# Patient Record
Sex: Female | Born: 1940
Health system: Southern US, Community
[De-identification: ages and names within clinical notes are randomized; demographics above are authoritative.]

## PROBLEM LIST (undated history)

## (undated) DIAGNOSIS — J189 Pneumonia, unspecified organism: Secondary | ICD-10-CM

## (undated) DIAGNOSIS — I493 Ventricular premature depolarization: Secondary | ICD-10-CM

## (undated) DIAGNOSIS — E739 Lactose intolerance, unspecified: Secondary | ICD-10-CM

## (undated) DIAGNOSIS — E559 Vitamin D deficiency, unspecified: Secondary | ICD-10-CM

## (undated) DIAGNOSIS — Z9289 Personal history of other medical treatment: Secondary | ICD-10-CM

## (undated) DIAGNOSIS — L209 Atopic dermatitis, unspecified: Secondary | ICD-10-CM

## (undated) DIAGNOSIS — Z96653 Presence of artificial knee joint, bilateral: Secondary | ICD-10-CM

## (undated) DIAGNOSIS — L659 Nonscarring hair loss, unspecified: Secondary | ICD-10-CM

## (undated) DIAGNOSIS — R32 Unspecified urinary incontinence: Secondary | ICD-10-CM

## (undated) DIAGNOSIS — F32A Depression, unspecified: Secondary | ICD-10-CM

## (undated) DIAGNOSIS — L309 Dermatitis, unspecified: Secondary | ICD-10-CM

## (undated) DIAGNOSIS — Z91018 Allergy to other foods: Secondary | ICD-10-CM

## (undated) DIAGNOSIS — M1990 Unspecified osteoarthritis, unspecified site: Secondary | ICD-10-CM

## (undated) DIAGNOSIS — L509 Urticaria, unspecified: Secondary | ICD-10-CM

## (undated) DIAGNOSIS — T4145XA Adverse effect of unspecified anesthetic, initial encounter: Secondary | ICD-10-CM

## (undated) DIAGNOSIS — I10 Essential (primary) hypertension: Secondary | ICD-10-CM

## (undated) DIAGNOSIS — F419 Anxiety disorder, unspecified: Secondary | ICD-10-CM

## (undated) DIAGNOSIS — M8580 Other specified disorders of bone density and structure, unspecified site: Secondary | ICD-10-CM

## (undated) DIAGNOSIS — B449 Aspergillosis, unspecified: Secondary | ICD-10-CM

## (undated) DIAGNOSIS — J45909 Unspecified asthma, uncomplicated: Secondary | ICD-10-CM

## (undated) DIAGNOSIS — R0602 Shortness of breath: Secondary | ICD-10-CM

## (undated) DIAGNOSIS — E039 Hypothyroidism, unspecified: Secondary | ICD-10-CM

## (undated) DIAGNOSIS — N189 Chronic kidney disease, unspecified: Secondary | ICD-10-CM

## (undated) DIAGNOSIS — E785 Hyperlipidemia, unspecified: Secondary | ICD-10-CM

## (undated) DIAGNOSIS — Z96642 Presence of left artificial hip joint: Secondary | ICD-10-CM

## (undated) DIAGNOSIS — N3030 Trigonitis without hematuria: Secondary | ICD-10-CM

## (undated) DIAGNOSIS — T7840XA Allergy, unspecified, initial encounter: Secondary | ICD-10-CM

## (undated) DIAGNOSIS — T8859XA Other complications of anesthesia, initial encounter: Secondary | ICD-10-CM

## (undated) DIAGNOSIS — F329 Major depressive disorder, single episode, unspecified: Secondary | ICD-10-CM

## (undated) DIAGNOSIS — E079 Disorder of thyroid, unspecified: Secondary | ICD-10-CM

## (undated) HISTORY — PX: CHOLECYSTECTOMY: SHX55

## (undated) HISTORY — DX: Trigonitis without hematuria: N30.30

## (undated) HISTORY — DX: Shortness of breath: R06.02

## (undated) HISTORY — DX: Unspecified osteoarthritis, unspecified site: M19.90

## (undated) HISTORY — PX: ADENOIDECTOMY: SUR15

## (undated) HISTORY — DX: Presence of artificial knee joint, bilateral: Z96.653

## (undated) HISTORY — DX: Urticaria, unspecified: L50.9

## (undated) HISTORY — PX: EYELID REPAIR W/ SKIN GRAFT: SHX1565

## (undated) HISTORY — DX: Lactose intolerance, unspecified: E73.9

## (undated) HISTORY — DX: Allergy to other foods: Z91.018

## (undated) HISTORY — DX: Allergy, unspecified, initial encounter: T78.40XA

## (undated) HISTORY — PX: BREAST SURGERY: SHX581

## (undated) HISTORY — PX: TONSILLECTOMY: SUR1361

## (undated) HISTORY — DX: Aspergillosis, unspecified: B44.9

## (undated) HISTORY — PX: COLONOSCOPY W/ POLYPECTOMY: SHX1380

## (undated) HISTORY — PX: CARPAL TUNNEL RELEASE: SHX101

## (undated) HISTORY — PX: LEFT OOPHORECTOMY: SHX1961

## (undated) HISTORY — PX: BLADDER REMOVAL: SHX567

## (undated) HISTORY — DX: Atopic dermatitis, unspecified: L20.9

## (undated) HISTORY — DX: Unspecified asthma, uncomplicated: J45.909

## (undated) HISTORY — PX: JOINT REPLACEMENT: SHX530

## (undated) HISTORY — DX: Anxiety disorder, unspecified: F41.9

## (undated) HISTORY — DX: Depression, unspecified: F32.A

## (undated) HISTORY — DX: Disorder of thyroid, unspecified: E07.9

## (undated) HISTORY — PX: APPENDECTOMY: SHX54

## (undated) HISTORY — PX: REPLACEMENT TOTAL KNEE BILATERAL: SUR1225

## (undated) HISTORY — PX: TUBAL LIGATION: SHX77

## (undated) HISTORY — DX: Vitamin D deficiency, unspecified: E55.9

## (undated) HISTORY — DX: Hyperlipidemia, unspecified: E78.5

## (undated) HISTORY — PX: EYE SURGERY: SHX253

## (undated) HISTORY — DX: Presence of left artificial hip joint: Z96.642

---
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
## 1988-06-11 HISTORY — PX: BREAST EXCISIONAL BIOPSY: SUR124

## 1999-01-18 ENCOUNTER — Other Ambulatory Visit: Admission: RE | Admit: 1999-01-18 | Discharge: 1999-01-18 | Payer: Self-pay | Admitting: Internal Medicine

## 1999-03-03 ENCOUNTER — Encounter: Payer: Self-pay | Admitting: Internal Medicine

## 1999-03-03 ENCOUNTER — Ambulatory Visit (HOSPITAL_COMMUNITY): Admission: RE | Admit: 1999-03-03 | Discharge: 1999-03-03 | Payer: Self-pay | Admitting: Rheumatology

## 1999-09-10 ENCOUNTER — Emergency Department (HOSPITAL_COMMUNITY): Admission: EM | Admit: 1999-09-10 | Discharge: 1999-09-10 | Payer: Self-pay | Admitting: Internal Medicine

## 1999-09-10 ENCOUNTER — Encounter: Payer: Self-pay | Admitting: Internal Medicine

## 1999-12-21 ENCOUNTER — Inpatient Hospital Stay (HOSPITAL_COMMUNITY): Admission: RE | Admit: 1999-12-21 | Discharge: 1999-12-25 | Payer: Self-pay | Admitting: Orthopaedic Surgery

## 1999-12-25 ENCOUNTER — Inpatient Hospital Stay (HOSPITAL_COMMUNITY)
Admission: RE | Admit: 1999-12-25 | Discharge: 1999-12-29 | Payer: Self-pay | Admitting: Physical Medicine & Rehabilitation

## 2000-01-15 ENCOUNTER — Encounter: Admission: RE | Admit: 2000-01-15 | Discharge: 2000-02-08 | Payer: Self-pay | Admitting: Orthopaedic Surgery

## 2000-01-18 ENCOUNTER — Other Ambulatory Visit: Admission: RE | Admit: 2000-01-18 | Discharge: 2000-01-18 | Payer: Self-pay | Admitting: Internal Medicine

## 2000-02-08 ENCOUNTER — Inpatient Hospital Stay (HOSPITAL_COMMUNITY): Admission: RE | Admit: 2000-02-08 | Discharge: 2000-02-13 | Payer: Self-pay | Admitting: Orthopaedic Surgery

## 2000-02-13 ENCOUNTER — Inpatient Hospital Stay (HOSPITAL_COMMUNITY)
Admission: RE | Admit: 2000-02-13 | Discharge: 2000-02-17 | Payer: Self-pay | Admitting: Physical Medicine & Rehabilitation

## 2000-03-07 ENCOUNTER — Encounter: Payer: Self-pay | Admitting: Internal Medicine

## 2000-03-07 ENCOUNTER — Encounter: Admission: RE | Admit: 2000-03-07 | Discharge: 2000-03-07 | Payer: Self-pay | Admitting: Internal Medicine

## 2000-03-08 ENCOUNTER — Encounter: Admission: RE | Admit: 2000-03-08 | Discharge: 2000-05-15 | Payer: Self-pay | Admitting: Orthopaedic Surgery

## 2000-09-06 ENCOUNTER — Encounter: Admission: RE | Admit: 2000-09-06 | Discharge: 2000-12-05 | Payer: Self-pay | Admitting: Orthopaedic Surgery

## 2000-10-08 ENCOUNTER — Ambulatory Visit (HOSPITAL_COMMUNITY): Admission: RE | Admit: 2000-10-08 | Discharge: 2000-10-08 | Payer: Self-pay | Admitting: Orthopaedic Surgery

## 2000-10-08 ENCOUNTER — Encounter: Payer: Self-pay | Admitting: Orthopaedic Surgery

## 2000-10-15 ENCOUNTER — Encounter: Admission: RE | Admit: 2000-10-15 | Discharge: 2000-10-15 | Payer: Self-pay | Admitting: Infectious Diseases

## 2000-10-29 ENCOUNTER — Encounter: Admission: RE | Admit: 2000-10-29 | Discharge: 2000-10-29 | Payer: Self-pay | Admitting: Infectious Diseases

## 2000-11-13 ENCOUNTER — Encounter: Payer: Self-pay | Admitting: Rheumatology

## 2000-11-13 ENCOUNTER — Encounter: Admission: RE | Admit: 2000-11-13 | Discharge: 2000-11-13 | Payer: Self-pay | Admitting: Rheumatology

## 2000-11-29 ENCOUNTER — Encounter: Admission: RE | Admit: 2000-11-29 | Discharge: 2000-11-29 | Payer: Self-pay | Admitting: Infectious Diseases

## 2000-12-16 ENCOUNTER — Encounter: Admission: RE | Admit: 2000-12-16 | Discharge: 2001-01-08 | Payer: Self-pay | Admitting: Orthopaedic Surgery

## 2001-01-04 ENCOUNTER — Encounter: Payer: Self-pay | Admitting: Emergency Medicine

## 2001-01-04 ENCOUNTER — Emergency Department (HOSPITAL_COMMUNITY): Admission: EM | Admit: 2001-01-04 | Discharge: 2001-01-04 | Payer: Self-pay | Admitting: Emergency Medicine

## 2001-01-08 ENCOUNTER — Encounter: Admission: RE | Admit: 2001-01-08 | Discharge: 2001-01-08 | Payer: Self-pay | Admitting: Infectious Diseases

## 2001-01-09 ENCOUNTER — Encounter: Admission: RE | Admit: 2001-01-09 | Discharge: 2001-01-22 | Payer: Self-pay | Admitting: Orthopaedic Surgery

## 2001-01-20 ENCOUNTER — Ambulatory Visit (HOSPITAL_COMMUNITY): Admission: RE | Admit: 2001-01-20 | Discharge: 2001-01-20 | Payer: Self-pay | Admitting: Infectious Diseases

## 2001-01-20 ENCOUNTER — Other Ambulatory Visit: Admission: RE | Admit: 2001-01-20 | Discharge: 2001-01-20 | Payer: Self-pay | Admitting: Internal Medicine

## 2001-01-20 ENCOUNTER — Encounter: Payer: Self-pay | Admitting: Infectious Diseases

## 2001-02-07 ENCOUNTER — Encounter: Admission: RE | Admit: 2001-02-07 | Discharge: 2001-02-07 | Payer: Self-pay | Admitting: Infectious Diseases

## 2001-03-10 ENCOUNTER — Encounter: Admission: RE | Admit: 2001-03-10 | Discharge: 2001-03-10 | Payer: Self-pay | Admitting: Internal Medicine

## 2001-03-10 ENCOUNTER — Encounter: Payer: Self-pay | Admitting: Internal Medicine

## 2001-04-23 ENCOUNTER — Encounter: Payer: Self-pay | Admitting: Orthopaedic Surgery

## 2001-04-23 ENCOUNTER — Inpatient Hospital Stay (HOSPITAL_COMMUNITY): Admission: RE | Admit: 2001-04-23 | Discharge: 2001-04-28 | Payer: Self-pay | Admitting: Orthopaedic Surgery

## 2001-04-23 ENCOUNTER — Encounter (INDEPENDENT_AMBULATORY_CARE_PROVIDER_SITE_OTHER): Payer: Self-pay | Admitting: Specialist

## 2001-04-27 ENCOUNTER — Encounter: Payer: Self-pay | Admitting: Orthopaedic Surgery

## 2001-04-28 ENCOUNTER — Inpatient Hospital Stay (HOSPITAL_COMMUNITY)
Admission: RE | Admit: 2001-04-28 | Discharge: 2001-05-05 | Payer: Self-pay | Admitting: Physical Medicine & Rehabilitation

## 2001-04-28 ENCOUNTER — Encounter: Payer: Self-pay | Admitting: Orthopaedic Surgery

## 2001-05-07 ENCOUNTER — Inpatient Hospital Stay (HOSPITAL_COMMUNITY): Admission: AD | Admit: 2001-05-07 | Discharge: 2001-05-19 | Payer: Self-pay | Admitting: Orthopaedic Surgery

## 2001-06-19 ENCOUNTER — Encounter: Payer: Self-pay | Admitting: Orthopaedic Surgery

## 2001-06-19 ENCOUNTER — Inpatient Hospital Stay (HOSPITAL_COMMUNITY): Admission: RE | Admit: 2001-06-19 | Discharge: 2001-06-25 | Payer: Self-pay | Admitting: Orthopaedic Surgery

## 2001-07-24 ENCOUNTER — Encounter: Admission: RE | Admit: 2001-07-24 | Discharge: 2001-08-14 | Payer: Self-pay | Admitting: Orthopaedic Surgery

## 2001-11-11 ENCOUNTER — Encounter: Payer: Self-pay | Admitting: Emergency Medicine

## 2001-11-11 ENCOUNTER — Inpatient Hospital Stay (HOSPITAL_COMMUNITY): Admission: EM | Admit: 2001-11-11 | Discharge: 2001-11-15 | Payer: Self-pay | Admitting: Emergency Medicine

## 2001-11-11 ENCOUNTER — Encounter (INDEPENDENT_AMBULATORY_CARE_PROVIDER_SITE_OTHER): Payer: Self-pay | Admitting: Specialist

## 2001-11-12 ENCOUNTER — Encounter: Payer: Self-pay | Admitting: Gastroenterology

## 2001-11-13 ENCOUNTER — Encounter: Payer: Self-pay | Admitting: General Surgery

## 2002-01-22 ENCOUNTER — Other Ambulatory Visit: Admission: RE | Admit: 2002-01-22 | Discharge: 2002-01-22 | Payer: Self-pay | Admitting: Internal Medicine

## 2002-03-11 ENCOUNTER — Encounter: Admission: RE | Admit: 2002-03-11 | Discharge: 2002-03-11 | Payer: Self-pay | Admitting: Internal Medicine

## 2002-03-11 ENCOUNTER — Encounter: Payer: Self-pay | Admitting: Internal Medicine

## 2002-05-01 ENCOUNTER — Ambulatory Visit (HOSPITAL_COMMUNITY): Admission: RE | Admit: 2002-05-01 | Discharge: 2002-05-01 | Payer: Self-pay | Admitting: Gastroenterology

## 2002-05-01 ENCOUNTER — Encounter (INDEPENDENT_AMBULATORY_CARE_PROVIDER_SITE_OTHER): Payer: Self-pay | Admitting: Specialist

## 2002-05-12 ENCOUNTER — Ambulatory Visit (HOSPITAL_COMMUNITY): Admission: RE | Admit: 2002-05-12 | Discharge: 2002-05-12 | Payer: Self-pay | Admitting: *Deleted

## 2002-10-04 ENCOUNTER — Encounter: Payer: Self-pay | Admitting: *Deleted

## 2002-10-04 ENCOUNTER — Inpatient Hospital Stay (HOSPITAL_COMMUNITY): Admission: EM | Admit: 2002-10-04 | Discharge: 2002-10-14 | Payer: Self-pay | Admitting: *Deleted

## 2002-10-06 ENCOUNTER — Encounter: Payer: Self-pay | Admitting: Internal Medicine

## 2002-10-15 ENCOUNTER — Encounter (HOSPITAL_COMMUNITY): Admission: RE | Admit: 2002-10-15 | Discharge: 2003-01-13 | Payer: Self-pay | Admitting: Internal Medicine

## 2002-10-16 ENCOUNTER — Encounter: Payer: Self-pay | Admitting: Internal Medicine

## 2002-10-20 ENCOUNTER — Encounter: Payer: Self-pay | Admitting: Internal Medicine

## 2002-10-20 ENCOUNTER — Ambulatory Visit (HOSPITAL_COMMUNITY): Admission: RE | Admit: 2002-10-20 | Discharge: 2002-10-20 | Payer: Self-pay | Admitting: Internal Medicine

## 2002-10-21 ENCOUNTER — Encounter: Payer: Self-pay | Admitting: Orthopaedic Surgery

## 2002-10-23 ENCOUNTER — Inpatient Hospital Stay (HOSPITAL_COMMUNITY): Admission: RE | Admit: 2002-10-23 | Discharge: 2002-10-28 | Payer: Self-pay | Admitting: Orthopaedic Surgery

## 2002-10-29 ENCOUNTER — Encounter: Payer: Self-pay | Admitting: Internal Medicine

## 2002-11-11 ENCOUNTER — Encounter: Admission: RE | Admit: 2002-11-11 | Discharge: 2002-11-11 | Payer: Self-pay | Admitting: Infectious Diseases

## 2002-11-18 ENCOUNTER — Encounter: Admission: RE | Admit: 2002-11-18 | Discharge: 2002-11-18 | Payer: Self-pay | Admitting: Infectious Diseases

## 2002-11-18 ENCOUNTER — Encounter: Payer: Self-pay | Admitting: Internal Medicine

## 2002-11-18 ENCOUNTER — Inpatient Hospital Stay (HOSPITAL_COMMUNITY): Admission: AD | Admit: 2002-11-18 | Discharge: 2002-11-19 | Payer: Self-pay | Admitting: Internal Medicine

## 2002-12-02 ENCOUNTER — Encounter: Admission: RE | Admit: 2002-12-02 | Discharge: 2002-12-02 | Payer: Self-pay | Admitting: Infectious Diseases

## 2003-01-20 ENCOUNTER — Encounter: Admission: RE | Admit: 2003-01-20 | Discharge: 2003-01-20 | Payer: Self-pay | Admitting: Infectious Diseases

## 2003-01-25 ENCOUNTER — Other Ambulatory Visit: Admission: RE | Admit: 2003-01-25 | Discharge: 2003-01-25 | Payer: Self-pay | Admitting: Internal Medicine

## 2003-03-24 ENCOUNTER — Encounter: Admission: RE | Admit: 2003-03-24 | Discharge: 2003-03-24 | Payer: Self-pay | Admitting: Internal Medicine

## 2003-03-24 ENCOUNTER — Encounter: Payer: Self-pay | Admitting: Internal Medicine

## 2003-04-11 ENCOUNTER — Emergency Department (HOSPITAL_COMMUNITY): Admission: EM | Admit: 2003-04-11 | Discharge: 2003-04-11 | Payer: Self-pay | Admitting: Emergency Medicine

## 2003-04-21 ENCOUNTER — Encounter: Admission: RE | Admit: 2003-04-21 | Discharge: 2003-04-21 | Payer: Self-pay | Admitting: Infectious Diseases

## 2004-02-02 ENCOUNTER — Other Ambulatory Visit: Admission: RE | Admit: 2004-02-02 | Discharge: 2004-02-02 | Payer: Self-pay | Admitting: Internal Medicine

## 2004-05-30 ENCOUNTER — Encounter: Admission: RE | Admit: 2004-05-30 | Discharge: 2004-05-30 | Payer: Self-pay | Admitting: Internal Medicine

## 2005-08-03 ENCOUNTER — Encounter: Admission: RE | Admit: 2005-08-03 | Discharge: 2005-08-03 | Payer: Self-pay | Admitting: Internal Medicine

## 2006-01-31 ENCOUNTER — Encounter (INDEPENDENT_AMBULATORY_CARE_PROVIDER_SITE_OTHER): Payer: Self-pay | Admitting: *Deleted

## 2006-01-31 ENCOUNTER — Ambulatory Visit (HOSPITAL_COMMUNITY): Admission: RE | Admit: 2006-01-31 | Discharge: 2006-01-31 | Payer: Self-pay | Admitting: Gastroenterology

## 2006-08-27 ENCOUNTER — Encounter: Admission: RE | Admit: 2006-08-27 | Discharge: 2006-08-27 | Payer: Self-pay | Admitting: Internal Medicine

## 2007-05-07 ENCOUNTER — Other Ambulatory Visit: Admission: RE | Admit: 2007-05-07 | Discharge: 2007-05-07 | Payer: Self-pay | Admitting: Obstetrics and Gynecology

## 2007-09-15 ENCOUNTER — Encounter: Admission: RE | Admit: 2007-09-15 | Discharge: 2007-09-15 | Payer: Self-pay | Admitting: Internal Medicine

## 2008-01-13 ENCOUNTER — Inpatient Hospital Stay (HOSPITAL_COMMUNITY): Admission: RE | Admit: 2008-01-13 | Discharge: 2008-01-17 | Payer: Self-pay | Admitting: Orthopaedic Surgery

## 2008-01-14 ENCOUNTER — Ambulatory Visit: Payer: Self-pay | Admitting: Internal Medicine

## 2008-02-05 ENCOUNTER — Ambulatory Visit: Payer: Self-pay | Admitting: Infectious Diseases

## 2008-02-05 DIAGNOSIS — L0390 Cellulitis, unspecified: Secondary | ICD-10-CM

## 2008-02-05 DIAGNOSIS — L259 Unspecified contact dermatitis, unspecified cause: Secondary | ICD-10-CM | POA: Insufficient documentation

## 2008-02-05 DIAGNOSIS — T8453XS Infection and inflammatory reaction due to internal right knee prosthesis, sequela: Secondary | ICD-10-CM

## 2008-02-05 DIAGNOSIS — L0291 Cutaneous abscess, unspecified: Secondary | ICD-10-CM

## 2008-02-05 DIAGNOSIS — J45909 Unspecified asthma, uncomplicated: Secondary | ICD-10-CM

## 2008-02-05 LAB — CONVERTED CEMR LAB
Basophils Relative: 0 % (ref 0–1)
CRP: 1 mg/dL — ABNORMAL HIGH (ref <0.6)
Eosinophils Relative: 6 % — ABNORMAL HIGH (ref 0–5)
Hemoglobin: 13.2 g/dL (ref 12.0–15.0)
Lymphs Abs: 2.9 10*3/uL (ref 0.7–4.0)
MCV: 89.2 fL (ref 78.0–100.0)
Monocytes Absolute: 0.6 10*3/uL (ref 0.1–1.0)
Monocytes Relative: 7 % (ref 3–12)
Platelets: 467 10*3/uL — ABNORMAL HIGH (ref 150–400)
TSH: 2.924 microintl units/mL (ref 0.350–4.50)

## 2008-09-01 ENCOUNTER — Encounter: Admission: RE | Admit: 2008-09-01 | Discharge: 2008-09-01 | Payer: Self-pay | Admitting: Sports Medicine

## 2008-09-16 ENCOUNTER — Encounter: Admission: RE | Admit: 2008-09-16 | Discharge: 2008-12-15 | Payer: Self-pay | Admitting: Sports Medicine

## 2008-09-24 ENCOUNTER — Encounter: Admission: RE | Admit: 2008-09-24 | Discharge: 2008-09-24 | Payer: Self-pay | Admitting: Internal Medicine

## 2009-10-13 ENCOUNTER — Encounter: Admission: RE | Admit: 2009-10-13 | Discharge: 2009-10-13 | Payer: Self-pay | Admitting: Internal Medicine

## 2010-10-09 ENCOUNTER — Other Ambulatory Visit: Payer: Self-pay | Admitting: Internal Medicine

## 2010-10-09 DIAGNOSIS — Z1231 Encounter for screening mammogram for malignant neoplasm of breast: Secondary | ICD-10-CM

## 2010-10-24 NOTE — Op Note (Signed)
NAMEFIZA, Linda Rice           ACCOUNT NO.:  [id number]   MEDICAL RECORD NO.:  [id number]          PATIENT TYPE:  INP   LOCATION:  2550                         FACILITY:  [facility]   PHYSICIAN:  Vonna Kotyk. Whitfield, M.D.DATE OF BIRTH:  1941-01-31   DATE OF PROCEDURE:  01/13/2008  DATE OF DISCHARGE:                               OPERATIVE REPORT   PREOPERATIVE DIAGNOSES:  Septic arthrosis, right total knee replacement  versus poly wear synovitis.   POSTOPERATIVE DIAGNOSES:  Septic arthrosis, right total knee replacement  versus poly wear synovitis - cultures pending.   PROCEDURES:  Incision and drainage, right total knee replacement with  radical synovectomy, and polyethylene exchange.   SURGEON:  Vonna Kotyk. Durward Fortes, MD   ASSISTANT:  Mike Craze. Petrarca, PA-C   ANESTHESIA:  General with supplemental femoral nerve block.   COMPLICATIONS:  None.   COMPONENTS:  P.[employer] Sigma crosslink stabilized insert 2.5 x 10 mm.   PROCEDURE:  The patient is comfortable on the operating table and under  general anesthesia, nursing staff inserted Foley catheter.  The right  lower extremity was placed in a thigh tourniquet and the leg was then  prepped with DuraPrep and the tourniquet to the ankle.  Sterile draping  was performed with this extremity still elevated, was Esmarch  exsanguinated with a proximal tourniquet at 370 mmHg.   The prior midline longitudinal incision was elliptically excised.  The  subcutaneous tissue was incised with the Bovie.  The superficial capsule  was opened with the Bovie.  The deep capsule was identified by the  nonabsorbable stitches, which were then incised and removed.  The deep  capsule was then incised and the joint was entered.  There was a cloudy  yellow fluid that was sent for aerobic and anaerobic culture.  The  capsule was incised proximally and distally, so that I could evert the  patella.  The patella did not have a component, as it was quite thin.  The  knee was then flexed to 90 degrees.  There was appearance of some  poly wear of the polyethylene wear bearing.  Diffuse synovectomy was  performed.  I did not suspect that this was infected but rather poly  wear synovitis, radical synovectomy was performed.  At that point, there  was some evidence of erosion of the femur beneath the femoral component  more so medially than laterally.  The metallic interface was intact but  it was the bone that had been worn away presumably by the aggressive  synovitis.  This was completely debrided until the lesser extent on the  lateral aspect of the femur looked perfectly intact on the tibial side.  Further synovectomy was performed.  The polyethylene bearing was easily  removed.  The posterior aspect of the joint in the midline and medially  and laterally was then debrided of any synovitis.  I copiously irrigated  the joint with saline solution.  I mixed 1 pack of cement with  tobramycin and filled in the bony gaps medially and laterally.  When it  was hardened, the wound was again irrigated with saline solution.  A new  P.[employer] Sigma 2.5 x 10 mm fixed bridging bearing was then inserted  without difficulty and impacted through a full range of motion.  There  was no abnormal movement.  There was no opening with varus or valgus  stress.  The wound was again irrigated with saline solution.  I did use  FloSeal for about 3 minutes before the tourniquet was released.  At that  point, the wound was again irrigated after about 5 minutes.  I had a  nice dry field.  The deep capsule was closed with an interrupted #1  Ethibond.  Superficial caps were closed in several layers with 0 and 2-0  Vicryl with skin closed with skin clips.  Sterile bulky dressing was  applied followed by compressive sock.   The patient tolerated the procedure without complications.   I did send multiple specimens of deep synovium for aerobic, anaerobic  culture, as well as the  fluid.      Vonna Kotyk. Durward Fortes, M.D.  Electronically Signed     PWW/MEDQ  D:  01/13/2008  T:  01/14/2008  Job:  [id number]

## 2010-10-25 ENCOUNTER — Ambulatory Visit
Admission: RE | Admit: 2010-10-25 | Discharge: 2010-10-25 | Disposition: A | Discharge status: Home / Self Care | Payer: PRIVATE HEALTH INSURANCE | Source: Ambulatory Visit | Attending: Internal Medicine | Admitting: Internal Medicine

## 2010-10-25 DIAGNOSIS — Z1231 Encounter for screening mammogram for malignant neoplasm of breast: Secondary | ICD-10-CM

## 2010-10-27 NOTE — Discharge Summary (Signed)
[place]. [facility]  Patient:    Linda [name], Linda [name] Visit Number: [id number] MRN: [id number]          Service Type: MED Location: [id number] Attending Physician:  Reinaldo Berber Dictated by:   Evert Kohl, P.A.C. Admit Date:  05/07/2001 Discharge Date: 05/19/2001                             Discharge Summary  ADMISSION DIAGNOSES: 1. Failed painful right total knee arthroplasty. 2. Asthma. 3. Chronic eczema. 4. Latex allergy. 5. Multiple food and medication allergies.  DISCHARGE DIAGNOSES: 1. Removal of failed right total knee arthroplasty with antibiotic    impregnated cement spacer. 2. Infection of joint prosthesis. 3. Postoperative blood loss anemia. 4. Asthma. 5. Chronic eczema. 6. Latex allergy. 7. Multiple food and medication allergies.  HISTORY OF PRESENT ILLNESS:  The patient is a 70 year old white female with a history of right total knee arthroplasty in 2001.  The patient had good results from August to January 2002 when she started to have a twinge type pain first thing in the morning when she stood up getting out of bed. Progressively worsening to currently being excruciating first thing in the morning and constant throughout the day.  It is currently a significant problem causing disability to ambulate due to the pain. The pain is described as a broken bone-like pain with radiation down the tibia medially. The patient is unable to ambulate any distance without any significant discomfort. She feels that her knee is unstable feeling. She does have significant swelling, increased warmth in the knee.  She has had multiple workups to rule out infection which are negative for any organism or growth.  The patient ambulates with the use of a cane or walker since September when she had to go on disability from work due to difficulty ambulating.  ALLERGIES:  LATEX, ERYTHROMYCIN, KEFLEX, HORSE SERUM, and multiple  food allergies.  CURRENT MEDICATIONS:  1. Zyrtec 10 mg p.o. q.h.s.  2. Atarax 50 mg p.o. q.h.s.  3. Vicodin p.r.n.  4. Centrum Silver one tablet p.o. q.d.  5. Calcium with vitamin D 600 mg p.o. q.d.  6. Iron _________ 150 mg one hour prior to meal.  7. Dulcolax one tablet p.o. q.h.s.  8. Albuterol multidose inhaler two sprays q.a.m. and q.h.s.  9. Flovent one puff q.a.m. and q.p.m. 10. Ultravate p.r.n. 11. Rogaine applied b.i.d. to affected areas.  SURGICAL PROCEDURE:  On April 23, 2001, the patient was taken to the OR by Dr. Joni Fears, assisted by Lara Mulch, M.D.  Under general anesthesia, the patient had a synovial debridement of her right knee with removal of right knee replacement components with insertion of poly methyl methacrylate, antibiotic spacer.  One Hemovac drain was left in place. The patient tolerated the procedure well without any complications.  CONSULTS:  Infectious disease consult was requested for recommendations of antibiotic treatment.  Physical therapy, occupational therapy, and rehab consults were requested.  HOSPITAL COURSE:  On April 23, 2001, the patient was admitted to [facility] under the care of Dr. Durward Fortes.  The patient was taken to the OR where a removal of her right total knee prosthesis was performed, a synovectomy was also performed and a placement of antibiotic impregnated cement spacer was placed.  One Hemovac drain was left in place and the patient was transferred to the recovery room and then to the  orthopedic floor in good condition.  The patient then incurred a four-day postoperative course in which he did develop some postoperative blood loss anemia for which he was transfused two units of packed red blood cells with good results.  The patient was seen by infectious disease.  Their recommendations was IV antibiotics through a PICC line for six to eight weeks depending on cultures and they would  continue to monitor.  The patients wound remained benign for any signs of infection.  Her leg remained neuromotor vascularly intact. She pretty much remained afebrile. Due to her home situation where she lived by herself and had difficulty ambulating, physical therapy arrangements were made for rehab prior to being discharged home.  Arrangements were made and the patient was transferred to the rehab services in good condition.  LABS:  Pathology report showed synovial tissue right patella, no objective increase in neutrophil synovium, surface fibrin deposition and underlying chronic inflammation and histocytes with focal crystalline material.  Synovial fluid right knee.  Synovium foci of acute synovitis greater than 5 neutrophils per high powered field examined associated with chronic synovitis, benign bone fragments and fibrin.  EKG on April 28, 2001, showed sinus bradycardia at 51 beats per minute.  Chest x-ray on April 23, 2001, showed central line to left innominate SVC confluence without pneumothorax.  CBC on April 27, 2001, showed wbc 8.4, hemoglobin 10.0, hematocrit 29.5, platelets 314.  PT 23.4, INR 2.7.  April 26, 2001, routine chemistries showed sodium 139, potassium 3.4, glucose 105, BUN 5, creatinine 0.7.  The patient received a total of two units of packed red blood cells during hospitalization.  MEDICATIONS ON DISCHARGE FROM ORTHOPEDIC FLOOR:  1. Colace 100 mg p.o. b.i.d.  2. Claritin 10 mg p.o. q.h.s.  3. Atarax 50 mg p.o. q.h.s.  4. Multivitamins with minerals one capsule p.o. q.d.  5. Os-Cal 500 plus D one tablet p.o. q.d.  6. Albuterol one puff b.i.d.  7. Flovent one puff 110 mcg b.i.d.  8. OxyContin CR 30 mg p.o. q.12h.  9. Laxative and enema of choice p.r.n. 10. Coumadin 5 mg p.o. q.d. 11. Rocephin 1 g IV q.12h. 12. Vancomycin 1250 mg IV q.12h. 13. Percocet one or two tablets every four to six hours p.r.n. breakthrough     pain. 14. Tylenol 650  mg p.o. q.4h. p.r.n. 15. Skelaxin one or two tablets every six hours p.r.n. pain.  DISCHARGE INSTRUCTIONS:  1. Medications:  The patient is to continue medications dispensed on    ortho floor. 2. Activity:  The patient may be weightbearing as tolerated and must be in    CPM 0 to 40 degrees at least eight hours a day. 3. Diet:  No restrictions. 4. Wound care:  The patient is to have wound checked daily for any signs    of infection. Staples to be removed on or about postoperative day #14.    Steri-Strips to be applied. 5. Special instructions:  The patient is to maintain Coumadin INR of 2 to 2.5. 6. Follow-up:  The patient is to have a follow-up appointment with Dr.    Durward Fortes approximately two weeks from the date of discharge from    rehab. 7. The patient is to continue IV vancomycin and Rocephin until changed    otherwise by Dr. Durward Fortes or infectious disease services. Dictated by:   Evert Kohl, P.A.C. Attending Physician:  Reinaldo Berber DD:  05/21/01 TD:  05/21/01 Job: [id number]

## 2010-10-27 NOTE — Discharge Summary (Signed)
[place]. [facility]  Patient:    Linda [name], Linda [name] Visit Number: [id number] MRN: [id number]          Service Type: MED Location: [id number] Attending Physician:  Reinaldo Berber Dictated by:   Evert Kohl, P.A. Admit Date:  05/07/2001 Disc. Date: 05/16/01   CC:         Delanna Ahmadi, M.D.   Discharge Summary  DATE OF BIRTH:  [birth year]  ADMISSION DIAGNOSES: 1. Failed right total knee arthroplasty with high number white blood cells on    high-power field from intraoperative specimen resulting in prosthesis    removal and implantation of antibiotic-impregnanted spacer for 6-8 weeks. 2. Asthma. 3. Chronic eczema. 4. Multiple medication and food allergies.  HISTORY OF PRESENT ILLNESS:  The patient is a 70 year old white female who has had a right total knee arthroplasty in July 2001.  The patient has been having pain, swelling, which has progressively worsened over time.  The patient has also been having multiple other symptoms such as alopecia, which was felt related to the implantation of the total knee.  She has also been having chronic low levels of increase of her sed rate and C-reactive protein. Multiple aspirations were performed of her knee with no specific organisms noted.  The patient was taken to the OR on April 23, 2001 for a planned revision of her right total knee arthroplasty.  Intraoperatively, specimens were obtained and taken to the lab for evaluation.  A high number of WBCs per high-power field were noted and the patient was then treated as if an infection of the total knee was present.  Once again, no organisms were noted. The patient had removal of her prosthesis and an antibiotic-impregnated cement spacer was placed.  The patient then had an uncomplicated postoperative course and went to rehab for a short period of time.  Rehab recommended a skilled nursing facility, due to the patients living alone and  difficulties with taking care of herself, but insurance refused approval.  The patient was discharged to home with home health physical therapy.  Home health physical therapy agency did several evaluations on the patient at home and felt that the patient was not safe at home and they were unable to provide care for the patient due to the feeling that she was not safe at home and they could not take care of her.  The decision was made, after a discussion with Dr. Durward Fortes and the home health agency, to readmit the patient for care and evaluation and reevaluation for placement into a skilled nursing facility after approval by insurance company.  The patient was readmitted.  SURGICAL PROCEDURES:  None.  CONSULTATIONS:  An infectious disease consult was requested for reevaluation of antibiotics and dosages and course of treatment.  HOSPITAL COURSE:  On May 07, 2001, the patient was readmitted to [facility] under the care of Dr. Joni Fears.  The patient was continued on IV vancomycin 1250 mg IV q.12h. and Rocephin 1 g IV q.24h.  The patients wound remained benign.  It did have a slightly increased warmth, as compared to the left knee.  The swelling did appear to be going down slightly. There was no significant erythema and only a trace effusion was noted.  The patient did develop some nausea and significant diarrhea and loss of appetite around hospital day #5.  Infectious disease reevaluated vancomycin trough.  It was found to be on the high side.  They readjusted the dose to being 1 g IV q.36h. and continue Rocephin at current dose of 1 g every 24 hours. C. difficile toxin assay was negative and the patients diarrhea resolved over the next day or so, after changing dose of vancomycin.  The patient continued to have improvement in her diet.  She had complete resolution of her diarrhea and she did overall improve in her general well feeling.  The patients INR on December 5  was 2.2.  She continued to be afebrile, her vital signs stable, and she was felt medically stable to be transferred to a skilled nursing facility for further care and IV antibiotics for a total of six weeks, until revision can be performed.  LABORATORY DATA:  INR on December 5:  2.2.  CBC on December 4:  WBC 9.8, hemoglobin 9.3, hematocrit 27.8, platelets 492.  Routine chemistries on December 4:  Sodium of 141, potassium 3.6, glucose 90, BUN 4, creatinine 1.0. C-reactive protein is 10.9 on December 4.  C. difficile is negative on December 3.  December 3 at 1 oclock, vancomycin trough is 31.5 and at 17:59 is 16.9.  MEDICATIONS ON DISCHARGE FROM ORTHOPEDIC FLOOR:  1. OxyContin CR 30 mg p.o. q.12h.  2. Claritin 10 mg p.o. q.d.  3. Hydroxyzine 50 mg p.o. q.h.s.  4. Multivitamins with mineral 1 capsule p.o. q.d.  5. Calcium carbonate 500 Plus D 1 tablet p.o. q.d.  6. Iron complex 1 capsule p.o. t.i.d. a.c.  7. Dulcolax 5 mg p.o. q.h.s.  8. Albuterol 90 mcg 1 puff q.a.m. and q.h.s.  9. Rogaine to be applied topically b.i.d. to affected areas. 10. Percocet 1 or 2 tablets p.o. q.4-6h. p.r.n. breakthrough pain. 11. Rocephin 1 g IV q.24h. 12. Vancomycin 1 g IV q.36h., the last given the morning of December 5 prior     to noontime. 13. Tylenol 650 mg p.o. q.6h. p.r.n. 14. Ambien 10 mg p.o. q.h.s. p.r.n. 15. Ultravate ointment apply to affected areas p.r.n. 16. Phenergan 25 mg p.o. q.6h. p.r.n. 17. Laxative or enema of choice p.r.n. 18. Reglan 10 mg p.o. q.8h. p.r.n. 19. Coumadin 3 mg p.o. q.d. to adjust INR between 2 and 2.5. 20. Imodium 2 mg p.o. p.r.n.  DISCHARGE INSTRUCTIONS:  1. Medications:  The patient is to continue medications as dispensed on     orthopedic floor.  2. Activity:  The patient may continue weightbearing as tolerated with the     use of a walker.  3. CMP 0-40 degrees 6-8 hours a day.  4. Diet:  No restrictions.  5. Wound care:  The patient is to have wound check  daily for any signs of     infection.   6. Special instructions:  The patient is to continue on the current IV     antibiotics for a total of six weeks from date of surgery.  7. Coumadin is to be maintained at an INR of 2-2.5.  8. Followup:  The patient is to have a followup appointment with     Dr. Durward Fortes for evaluation of her knee in two weeks from date of     discharge from orthopedic floor.  Call [phone] for a followup     appointment.  The patients condition on discharge from orthopedic floor at [facility] is listed as improved and good. Dictated by:   Evert Kohl, P.A. Attending Physician:  Reinaldo Berber DD:  05/16/01 TD:  05/16/01 Job: [id number]

## 2010-10-27 NOTE — Discharge Summary (Signed)
[place]. [facility]  Patient:    Linda [name], Linda [name] Visit Number: [id number] MRN: [id number]          Service Type: [facility] Location: *N Attending Physician:  Gilmore Laroche Dictated by:   Zenaida Deed, P.A.C. Adm. Date:  06/19/01 Disc. Date: 06/25/01   CC:         Dr. Laurann Montana   Discharge Summary  ADMISSION DIAGNOSES: 1. 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 2. Asthma. 3. Eczema. 4. History of cellulitis left leg. 5. Alopecia. 6. Multiple allergies.  DISCHARGE DIAGNOSES: 1. 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 2. Asthma. 3. Eczema. 4. History of cellulitis left leg. 5. Alopecia. 6. Multiple allergies. 7. Acute blood loss anemia secondary to surgery. 8. Benign positional vertigo. 9. Diarrhea.  SURGICAL PROCEDURE:  On June 19, 2001, [name] underwent a revision of her right total knee arthroplasty with removal of antibiotic spacer by Vonna Kotyk. Durward Fortes, M.D., assisted by Lara Mulch, M.D.  COMPLICATIONS:  None.  CONSULTS: 1. Pharmacy consult for Coumadin therapy, June 19, 2001. 2. Physical therapy consult June 20, 2001. 3. Infectious disease consult and case management June 21, 2001. 4. Internal medicine consult June 25, 2001, by Dr. Laurann Montana.  HISTORY OF PRESENT ILLNESS:  This 70 year old white female patient presented to Dr. Durward Fortes with a history of a right knee replacement by Dr. Durward Fortes in July of 2001.  She had difficulty with possible infection and losing her hair in the postop course and in November of 2002, she had removal of the prosthesis with placement of antibiotic spacer. She has been treated with antibiotics for six weeks now and is ready for removal of the spacer and revision of her right knee replacement.  HOSPITAL COURSE:  The  patient tolerated her surgical procedure well without immediate postoperative complications. She was subsequently transferred to 5000.  Preop antibiotics were continued at that time.  Postop day #1, she complained of some mild right calf pain and some mild shortness of breath which was treated with inhaler. T-max 102, vital signs stable.  Dressing to the right knee was intact without drainage.  Legs were neurovascularly intact. Hemoglobin was 8.8 with hematocrit 25.8.  Lab work was monitored. A  lower extremity venous Doppler was ordered to rule out a DVT and that was negative. She was started on physical therapy.  She became symptomatic due to her low hemoglobin later that day and was transfused with two units of packed red blood cells.  Postop day #2, she was afebrile, vital signs stable.  Hemoglobin improved to 10.5, hematocrit 31.  Right knee incision was well-approximated with staples. She was continued on IV antibiotics at this time but infectious disease though it would be safe to discontinue them.  They were discontinued and her PICC line was discontinued prior to discharge.  She remained basically afebrile with vital signs stable.  She did well with physical therapy.  She did have complaints of vertigo starting on June 24, 2001, and her hemoglobin and hematocrit were stable.  It happened only with certain positions and Dr. Laurann Montana was consulted.  He will follow her for that after discharge.  She also complained of some exacerbation of her asthma and she was started on Flovent inhaler with Dr. Laurann Montana to make adjustments of that after discharge.  On  June 25, 2001, she was doing better. She was able to control the vertigo by watching her position changes.  T-max was 99.3.  Vitals were stable.  Right knee incision well-approximated with staples without drainage.  Pain was well-controlled with p.o. medications and she was to be on p.o. antibiotics. She was believed to be stable  for discharge home and was discharged home later that day.  DISCHARGE INSTRUCTIONS: 1. Diet: She can resume her regular prehospitalization diet. 2. Medications:  She can resume all prehospitalization medications except    for her aspirin, Rocephin, vancomycin and Procrit.  Additional medications    include:    a. Coumadin 2.5 mg one tablet p.o. q.d. with an INR determined per       pharmacy.    b. Flovent 110 mg two puffs inhaled twice a day for one to two weeks and       wean off per Dr. Laurann Montana.    d. Tequin 400 mg one tablet p.o. q.d. for four weeks.    e. OxyContin 20 mg one tablet p.o. q.12h. 24 with no refill.    f. OxyIR 5 mg one to two tablets p.o. q.4h. p.r.n. for pain, 55 with no       refill.    g. Robaxin 500 mg one to two tablets p.o. q.6h. p.r.n. for spasm, 40       with no refill. 3. Activity:  She is to be out of bed weightbearing as tolerated on the right    leg with use of a walker.  She is to use the home CPM 0 to 110 degrees    six to eight hours a day. 4. Home health physical therapy per [facility] and their    pharmacy for Coumadin therapy. 5. Wound care:  She needs to keep her knee incision clean and dry and may    shower after no drainage from the wound for two days. 6. She needs to notify Dr. Durward Fortes of temperature greater than 101.5,    chills, pain unrelieved by pain medications or foul smelling drainage    from the wound.  FOLLOW-UP:  She needs to follow up with Dr. Durward Fortes in our office next week and is to call [phone] to set up that appointment.  LABORATORY DATA:  Lower extremity Venous Doppler on June 20, 2001, showed no evidence of DVT, superficial thrombosis or Baker cyst bilaterally.  On June 19, 2001, chest x-ray showed central line as above without pneumothorax and mild cardiomegaly.  Portable right knee film showed right knee arthroplasty without apparent complications.   On June 17, 2001, white count 10.9,  platelets 454.  On June 20, 2001, white count 10.1, hemoglobin 8.8, hematocrit 25.8.  Repeat hemoglobin and hematocrit showed a hemoglobin 8.4, hematocrit 25.  On June 21, 2001, white count 10.9, hemoglobin 10.5, hematocrit 31.  On June 22, 2001, white count 11.3, hemoglobin 9.9, hematocrit 29.3.  On June 23, 2001, white count 9.1, hemoglobin 9.9, hematocrit 29.6, and platelets 350.  ESR on June 17, 2001, was 41.  On June 20, 2001, PT 16, INR 1.4.  On June 24, 2001, PT 22.8, INR 2.5.  On June 17, 2001, albumin 3.  On June 20, 2001, sodium 134, potassium 3.8, glucose 130, calcium 7.8.  On June 21, 2001, sodium 133, potassium 3.7, BUN 5, creatinine 0.7, and calcium 7.7.  On June 22, 2001, calcium 7.6.  On June 23, 2001, potassium 3.2, BUN 4, creatinine 0.7, calcium 8.  On June 24, 2001, glucose 122, BUN 3, creatinine 0.7 and calcium 8.2.  June 17, 2001, C-reactive protein 2.8.  Knee aspirate on June 17, 2001, showed amber cloudy urine, 315 white cells, 2% eosinophils.  Urinalysis on June 17, 2001, showed moderate leukocyte esterase, few epithelials, 7 to 10 white cells, 3 to 6 red cells and few bacteria.  On June 17, 2001, she showed A positive blood with positive antibody screen. Anti-K antibody.  All other laboratory studies were within normal limits. Dictated by:   Zenaida Deed, P.A.C. Attending Physician:  Gilmore Laroche DD:  07/07/01 TD:  07/07/01 Job: [id number]

## 2010-10-27 NOTE — Op Note (Signed)
[place]. [facility]  Patient:    PANG, GODINHO                    MRN: [id number] Proc. Date: 12/21/99 Adm. Date:  12/21/99 Attending:  Vonna Kotyk. Durward Fortes, M.D.                           Operative Report  PREOPERATIVE DIAGNOSIS:  Osteoarthritis right knee.  POSTOPERATIVE DIAGNOSIS:  Osteoarthritis right knee.  PROCEDURE:  Right total knee replacement.  SURGEON:  Vonna Kotyk. Durward Fortes, M.D.  ASSISTANT:  Evert Kohl, P.A.  ANESTHESIA:  General orotracheal.  COMPLICATIONS:  None.  COMPONENTS:  Depuy LCS standard, femoral standard, plus rotating tibial platform with a 12.5 mm bridging bearing and a cruciate metal backed patella. All were secured with polymethyl methacrylate.  PROCEDURE:  With the patient comfortable on the operating table and under general orotracheal anesthesia, a non-latex Foley catheter was inserted.  A thigh tourniquet was placed on the right lower extremity and then the leg prepped with Betadine scrub and Duraprep and the tourniquet to the ankles. Sterile draping was performed.  With the extremity still elevated it was Esmarch exsanguinated with the proximal tourniquet at 350 mmHg.  A midline longitudinal incision was made and via sharp dissection carried down to subcutaneous tissue.  The first layer of capsule was incised in the midline.  A medial parapatellar incision was then made with the Bovie.  There was probably 15-20 cc of clear yellow joint effusion.  The patella was everted 180 degrees and the knee flexed to 90 degrees.  There was complete loss of articular cartilage in the medial compartment.  There were large osteophytes along the medial and lateral femoral condyle and even about the patella. There was also a moderate amount of beefy red synovitis, and a partial synovectomy was performed.  Preoperatively we had measured a standard femoral and a standard plus tibial component.  These were confirmed  intraoperatively.  Retractors were then inserted.  We again templated them to be sure we had the right size components.  The jigs were applied to make the appropriate femoral and tibial cuts using a 12.5 mm flexion extension gap which was symmetrical.  Both ACL and PCL were sacrificed.  A 4 degree distal valgus cut was made.  The laminar spreaders were then inserted to remove remnants of medial and lateral menisci as well as PCL and ACL.  We had excellent decompression posteriorly at both the lateral and medial compartments.  The trial components were then inserted.  There was no laxity with the varus or valgus stress. There was excellent stability both anteriorly and posteriorly.  The patella was then prepared by removing 8 mm of bone leaving a 12 mm thickness.  The ______ jig was used to obtain the appropriate position on the patella.  The trial patella was inserted and fit very nicely but there was lateral subluxation so a lateral release was performed.  All the trial components were removed.  The joint was then copiously irrigated with jet saline and antibiotic solution.  Each of the components were then applied and secured with polymethyl methacrylate.  The knee was placed in extension while the methacrylate matured.  The patella clamp was applied.  After complete maturation of the methacrylate the joint was inspected.  There were a few areas of remaining extraneous thickened hardened methacrylate and these were removed with a osteotome.  The joint  was placed through a full range of motion.  There was no subluxation.  The patella tracked midline. There was no malrotation of the rotating tibial tray.  The tourniquet was then deflated.  Gross bleeders were Bovie coagulated and the joint was nice and dry and therefore a Hemovac was not inserted.  The deep capsule was closed in flexion with 0 Tycron suture.  The superficial capsule closed with a running 0 Vicryl, the subcuticular with  0 and 2-0 Vicryl and the skin with skin clips.  We irrigated with saline solution, antibiotic solution throughout the closure.  A sterile bulky dressing was applied followed by a knee immobilizer.  The patient returned to the post anesthesia recovery room in satisfactory condition. DD:  12/21/99 TD:  12/21/99 Job: [id number]

## 2010-10-27 NOTE — Op Note (Signed)
NAME:  Linda [name], DRUMMONDS                    ACCOUNT NO.:  [id number]   MEDICAL RECORD NO.:  [id number]                   PATIENT TYPE:  INP   LOCATION:                                       FACILITY:  [facility]   PHYSICIAN:  Vonna Kotyk. Durward Fortes, M.D.            DATE OF BIRTH:  1941/05/17   DATE OF PROCEDURE:  10/23/2002  DATE OF DISCHARGE:                                 OPERATIVE REPORT   PREOPERATIVE DIAGNOSIS:  Polyarthrosis of right total knee replacement.   POSTOPERATIVE DIAGNOSIS:  Polyarthrosis of right total knee replacement.   PROCEDURE:  1. Irrigation, debridement and polyethylene tibial tray exchange.  2. Removal of patella component.   SURGEON:  Vonna Kotyk. Durward Fortes, M.D.   ASSISTANT:  Vonita Moss. Duffy, P.A.   ANESTHESIA:  General endotracheal anesthesia.   COMPLICATIONS:  None.   PROCEDURE:  With the patient comfortable on the operating room table and  under general endotracheal anesthesia, a Foley catheter was inserted by the  nursing staff.  A thigh tourniquet was applied to the right lower extremity.  The leg was then prepped with Duraprep.   The extremity was elevated and an Esmarch exsanguinated with a proximal  tourniquet at 350 mmHg.   The previous midline longitudinal incision was utilized and by sharp  dissection carried down to the subcutaneous tissue.  The joint was then  opened and there was obvious polyarthrosis.  This was sent for culture and  sensitivity.   There was considerable synovitis and this was debrided with a rongeur and  the Bovie and had an excellent synovectomy and debridement.  The patella was  tight and after releasing the thickened tissue laterally, I could evert the  patella 180 degrees so I could thus flex the knee to over 90 degrees.  Further synovectomy was performed such that I felt that there was no further  pathologic synovium remaining throughout the knee.  The polyethylene  bridging tibial bearing was removed.  There was  some synovitis behind the  joint which was also removed.  In about 20 minutes post tourniquet we lost  the pressure.  Accordingly, we let the tourniquet down and just had very  little bleeding. We had some bleeding thereafter but we were able to control  it nicely and therefore did not require further tourniquet.   The patella component was obviously loosened and had just fallen off the  patella, so we elected to leave it out, but I did remove any remaining  methacrylate.  It was basically a shell and I was concerned that it may re-  loosen or act as more of a foreign body, so we left it out and debrided the  thickened tissue around the patella.   The joint was then copiously irrigated with jet saline.  A new polyethylene  bridging bearing was inserted 10 mm thickness and we had excellent stability  with varus and valgus stress.  We again  irrigated the joint and felt that we  had a very nice dry field.   It was felt that we lost probably 600 to 700 mL and one unit of packed cells  was to be replaced.   The capsule was then closed with 0 Tycron and 0 Vicryl, 2-0 Vicryl and then  skin clips. A sterile, fluffy dressing was applied followed by an ace  bandage for patient support.   I also was careful to check for any sinus tracts or weakened bone to suggest  that there was any osteomyelitis and did not see any.  We did not feel that  any of the components were loose other than the patella.                                                  Vonna Kotyk. Durward Fortes, M.D.    PWW/MEDQ  D:  10/23/2002  T:  10/24/2002  Job:  [id number]

## 2010-10-27 NOTE — Op Note (Signed)
   NAME:  Linda [name], Linda [name]                     ACCOUNT NO.:  [id number]   MEDICAL RECORD NO.:  [id number]                   PATIENT TYPE:  INP   LOCATION:  5506                                 FACILITY:  [facility]   PHYSICIAN:  Vonna Kotyk. Durward Fortes, M.D.            DATE OF BIRTH:  [birth year]   DATE OF PROCEDURE:  10/05/2002  DATE OF DISCHARGE:                                 OPERATIVE REPORT   PREOPERATIVE DIAGNOSIS:  Pyarthrosis, right total knee replacement.   POSTOPERATIVE DIAGNOSIS:  Pyarthrosis, right total knee replacement.   PROCEDURE:  Arthroscopic lavage and synovectomy.   SURGEON:  Vonna Kotyk. Durward Fortes, M.D.   ANESTHESIA:  General orotracheal.   COMPLICATIONS:  None.   PROCEDURE:  With the patient comfortable on the operating table and under  general orotracheal anesthesia, the right lower extremity was placed in a  thigh holder. Both legs were then flexed to 90 degrees. Padding was provided  beneath the left lower extremity. The right lower extremity was then prepped  with Duraprep in the thigh holder to the ankle. Sterile draping was  performed.   Diagnostic arthroscopy was performed using a medial and lateral peripatellar  tendon puncture site. There was gross purulence exuding from the  arthroscopic portal and cannula. This was sent for both anaerobic and  aerobic cultures.   The arthroscope was then placed into the joint, and very quickly the  purulence resolved. There was some beefy red synovial tissue. A 2nd portal  was established and arthroscopic lavage was performed A 3rd portal was also  established for irrigation through a large cannula. A total of over 9000 cc  of fluid was irrigated into the joint. I did a very nice lavage and  synovectomy.   A Hemovac drain was then passed through the superior portal. Each of the  portals were closed with 3-0 Vicryl and 3-0 Ethilon. A sterile bulky  dressing was applied. The Hemovac was then charged. A sterile bulky  dressing  was applied to avoid using latex.   The patient tolerated the procedure. There were no complications.                                               Vonna Kotyk. Durward Fortes, M.D.    PWW/MEDQ  D:  10/05/2002  T:  10/06/2002  Job:  [id number]

## 2010-10-27 NOTE — H&P (Signed)
[place]. [facility]  Patient:    Linda [name], Linda [name]              MRN: [id number] Adm. Date:  02/08/00 Attending:  Vonna Kotyk. Durward Fortes, M.D. Dictator:   Evert Kohl, P.A.                         History and Physical  DATE OF BIRTH:  07-30-1940  CHIEF COMPLAINT:  Left knee pain.  HISTORY OF PRESENT ILLNESS:  This patient is a 70 year old white female with a history of bilateral knee pain since the early 90s.  The patient underwent a right total knee arthroplasty in July 2001 and had very good results and is now presenting for a left total knee arthroplasty.  The patient had x-rays taken in the early 1990s and was told that she was heading towards a bilateral knee replacement at that current time.  The patient initially had some clicking in her knees but that gradually improved but the pain in her knees worsened over time.  The patient does have some sharp stabbing and shooting pains in her knees with awkward movements and weight bearing activities. Otherwise the pain is considered a constant aching sensation.  The patient was placed on Celebrex and did have improvement of her symptoms.  The patient occasionally does have swelling and a sensation like her knee will give out. The patient is progressing very nicely with physical therapy with her right total knee and is ready for surgery on her left.  DRUG ALLERGIES:  Latex, erythromycin, Keflex, horse serum.  FOOD ALLERGIES:  Walnuts, peas, beans, all poultry, carrots, sweet potatoes, eggs, pork and fin fish.  CURRENT MEDICATIONS: 1. Celebrex 200 mg p.o. q.d. 2. Prempro 0.025 mg/2.5 mg p.o. q.d. 3. Zyrtec 10 mg p.o. q.d. 4. Protopic ointment 0.1% apply to affected areas q.d. 5. Ultravate ointment 0.05% 6. Albuterol inhaler p.r.n.  PAST MEDICAL HISTORY:  The patient has a significant history of chronic eczema for which she is being treated by Dr. Wilhemina Bonito.  The patient currently  has eczema on bilateral knees which was cleared prior to the right total knee arthroplasty and has remained clear with current ointments.  The patient also has a significant history of asthma for which respiratory infections and heavy exertion triggers her symptoms.  The patient is controlled with albuterol MDI. The patient has also had an incidence of cellulitis in the left lateral aspect of her fibula which was cleared up with Levaquin.  The patient denies any history of diabetes, high blood pressure, thyroid disease, hiatal hernia, peptic ulcers, heart disease or any other GI or GU problems.  PAST SURGICAL HISTORY:  The patient had a left ovary removed in 1962 with an appendectomy.  Tubal ligation with laparoscopy in 1981.  Bilateral cataract surgery in the early 80s.  The patient denies any complications with the above mentioned procedures.  SOCIAL HISTORY:  The patient is a 70 year old white female who denies any significant smoking history.  The patient does occasionally use alcohol about one drink a week.  The patient is divorced.  She does have four grown children.  She lives in a one story house with no steps into the front entrance but there are two short flights of stairs within the house.  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  FAMILY PHYSICIAN:  Delanna Ahmadi, M.D., [phone]  FAMILY  MEDICAL HISTORY:  Mother is deceased at age 43 with Alzheimers disease, diabetes, stroke, atrial fibrillation and hypertension.  Father is alive at age 62 with pulmonary fibrosis from smoking and has hypercholesterolemia.  The patient has one sister alive with a history of CVA and hypotension.  REVIEW OF SYSTEMS:  Positive for cat scratch fever approximately ten years ago.  The patient does use reading glasses.  Otherwise negative for any other problems that are contributory at this time.  PHYSICAL EXAMINATION:  VITAL SIGNS:  Weight  196, height 5 feet 5-1/2 inches, pulse 64, respirations 12, temperature 97.0.  Blood pressure is 128/86.  GENERAL:  This is a slightly heavy set female, very pleasant.  She is able to get on and off the exam table without much disability.  Does appear in slight discomfort when doing so.  HEENT:  Head is normocephalic and atraumatic.  Nontender over maxillary or frontal sinuses.  Pupils are equal, round and reactive to accommodation and light.  Extraocular movements are intact.  Sclerae is not icteric. Conjunctiva is pink and moist.  Funduscopic exam is normal appearing. External ears were without deformities.  Canals pink.  TMs are pearly gray. Nasal septum is deviated to the left.  No polyps noted.  Mucous membranes were pink and moist.  Oral buccal mucosa was pink and moist without lesions. Dentition was in fair repair.  Uvula was midline and symmetrical with phonation.  NECK:  Supple.  No palpable lymphadenopathy.  Thyroid gland was nontender. The patient had good range of motion of her cervical spine without any difficulty or tenderness.  CHEST:  Lung sounds were clear and equal bilaterally.  No wheezes, rales, rhonchi or rubs are noted.  HEART:  Regular rate and rhythm.  S1 and S2 is auscultated.  No murmurs, rubs or gallops noted.  ABDOMEN:  Soft, round, nontender.  No hepatosplenomegaly or bowel sounds were present throughout.  No CVA tenderness to percussion.  EXTREMITIES:  Upper extremities were symmetric in size and shape with excellent range of motion of her shoulders, elbows and wrists bilaterally. Motor strength was symmetrical and 5/5 in all muscle groups.  Lower extremities:  Hips had full extension and flexion back to 90 degrees with 20 to 30 degrees internal and external rotation.  There was no crepitus or mechanical symptoms in her hips and no pain.  Right and left knee were symmetric size and shape at the current time.  Right knee had a well healed surgical  incision.  The patient had multiple areas of various pigmentation  changes due to her eczema from bilateral knees.  The left knee had no palpable effusion.  It was nontender along the medial or lateral joint line.  There was minimal amount of crepitus on the patella with full extension of flexion down to 90 degrees.  The patient had no valgus or varus laxity.  The patients right and left ankles were symmetric in size and shape.  Good dorsiflexor and plantar flexion.  PERIPHERAL VASCULAR: Carotid pulses are 2+.  No bruits.  Radial and femoral pulses were 2+ bilaterally.  Dorsalis pedis pulses were 1+ bilaterally.  There was 2+ pitting edema on the ankles only. No other pigmentation or venous stasis changes noted in the ankles.  NEUROLOGIC:  The patient was conscious, alert and appropriate, at the conversation with the examiner.  Cranial nerves 2 through 12 are grossly intact.  The patient was grossly intact to light touch sensation from the head to toe.  BREASTS/RECTAL/GU: Deferred  at this time.  IMPRESSION: 1. End-stage osteoarthritis, left knee. 2. Status post right total knee arthroplasty. 3. Asthma. 4. Chronic eczema. 5. History of multiple drug and food allergies.  PLAN:  The patient will be admitted to [facility] on February 08, 2000. The patient will be admitted under Dr. Barbra Sarks care.  The patient will undergo a planned left total knee arthroplasty after receiving all routine lab tests.  The patient has donated two units of autologous blood. The patient is about 1/4 inch longer on the right leg at the current time. DD:  02/06/00 TD:  02/06/00 Job: [id number]

## 2010-10-27 NOTE — Discharge Summary (Signed)
[name]. [facility]  Patient:    Linda [name], Linda [name]                     MRN: [id number] Adm. Date:  [id number] Disc. Date: 12/29/99 Attending:  Gilmore [name] Dictator:   Linda [name]. [place], P.A. CC:         Linda [name], M.D.             Linda [name], M.D.             Linda [name], M.D.                           Discharge Summary  DISCHARGE DIAGNOSES: 1. Right total knee arthroplasty December 21, 1999. 2. Postoperative anemia. 3. Asthma. 4. History of cellulitis left leg.  HISTORY OF PRESENT ILLNESS:  [age]-year-old female admitted December 21, 1999, with progressive bilateral knee pain, right greater than left.  No relief with conservative care.  X-rays with advanced degenerative joint disease.  Underwent a right total knee arthroplasty December 21, 1999, per Linda [name].  Placed on Coumadin for deep vein thrombosis prophylaxis and partial weightbearing.  Postoperative anemia and transfused.  No chest pain or shortness of breath.  CPM machine 0-60 degrees.  Requiring supervision for ambulation.  Latest hemoglobin 9.7, hematocrit 28.7.  INR 2.3.  Chemistries unremarkable.  Admitted for comprehensive rehabilitation program.  PAST MEDICAL HISTORY: 1. Asthma. 2. History of cellulitis left leg.  PAST SURGICAL HISTORY: 1. Tubal ligation. 2. Breast lumpectomy. 3. Left oophorectomy.  ALLERGIES:  E-MYCIN, KEFLEX, HORSE SERUM, and LATEX.  TOBACCO/ALCOHOL:  No tobacco, occasional alcohol.  PRIMARY M.D.:  Linda [name].  MEDICATIONS PRIOR TO ADMISSION:  Celebrex and Prempro.  SOCIAL HISTORY:  Lives alone in [place].  Independent prior to admission. 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  Daughter in area to [place]:  The patient did well while on rehabilitation services with therapies initiated on  a b.i.d. basis.  The following issues were followed during the patients rehabilitation course.  Pertaining to [name] right total knee arthroplasty, remained stable.  Surgical site healing nicely. No signs of infection.  She was partial weightbearing with a walker.  She continued on Coumadin for deep vein thrombosis prophylaxis.  Venous Doppler studies prior to her discharge were negative.  She would complete Coumadin protocol of four weeks total, to be followed by Linda [name].  She was using CPM machine, range of motion is now 0-99 degrees.  Postoperative anemia was stable, with latest hemoglobin 10.5, hematocrit 31.7.  Pain management ongoing with the use of OxyContin CR and Tylox as needed, with good results. She had a history of asthma.  She was using an albuterol inhaler only as needed.  Lungs remained clear to auscultation.  No shortness of breath. Oxygen saturations greater than 90% on room air.  She had a history of cellulitis in the left leg many years ago.  As noted above, venous Doppler studies showed no signs of deep vein thrombosis.  Overall, for her functional mobility, she was ambulating extended distances with a walker, essentially independent to standby assist in all areas of activities of daily living, in dressing, grooming, and homemaking.  Overall, her strength and endurance greatly improved as she was encouraged with her  overall progress and discharged to home.  Latest laboratories showed a sodium of 138, potassium 4.2, BUN 10, creatinine 0.8.  Hemoglobin 10.5, hematocrit 31.7.  DISCHARGE MEDICATIONS: 1. Coumadin daily, with dose to be established at the time of discharge x a    total of four weeks from the day of December 21, 1999. 2. Claritin 10 mg daily. 3. Prempro daily. 4. Celebrex 200 mg daily. 5. Tylox as needed for pain.  ACTIVITY:  Partial weightbearing with walker.  DIET:  Regular.  WOUND CARE:  Call Linda [name] if any increased drainage,  redness, or fever. Follow up for removal of staples.  SPECIAL INSTRUCTIONS:  No aspirin or ibuprofen while on Coumadin.  FOLLOW-UP:  Linda [name] to arrange for follow-up prothrombin time via home health nurse versus outpatient therapy.  Follow up with Linda [name], call for appointment.  Linda [name] for medical management. DD:  12/27/99 TD:  12/29/99 Job: [id number]

## 2010-10-27 NOTE — Discharge Summary (Signed)
[place]. [facility]  Patient:    Linda [name], Linda [name]                     MRN: [id number] Adm. Date:  [id number] Disc. Date: [id number] Attending:  Gilmore Laroche Dictator:   Evert Kohl, P.A.                           Discharge Summary  ADMISSION DIAGNOSES: 1. Bilateral osteoarthritis of the knees. 2. Asthma. 3. Chronic eczema. 4. History of multiple drug and food allergies.  DISCHARGE DIAGNOSES: 1. Status post left total knee arthroplasty. 2. Osteoarthritis of right knee. 3. Asthma. 4. Chronic eczema. 5. History of multiple drug allergies and food allergies.  HISTORY OF PRESENT ILLNESS:  This is a 70 year old white female with a 12-year history of bilateral knee problems.  The patient states the problem started with clicking in her knees about 1990.  She had gradually worsening knee pain with time.  The clicking gradually discontinued on its own, but the pain continued.  While on Celebrex the patient was tolerable but, when off it, the pain became sharp and shooting with awkward movements.  The patient also has increased pain with weightbearing activities.  The patients left leg does have swelling.  It does give out at times.  The patient does have difficulty up and down stairs.  No previous surgical procedures on the knee.  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  ALLERGIES: 1. LATEX. 2. ERYTHROMYCIN. 3. KEFLEX. 4. HORSE SERUM. 5. FOODS including NUTS, PEAS, BEANS, POULTRY, CARROTS, SWEET POTATOES, EGGS,    PORK AND FIN FISH.  CURRENT MEDICATIONS: 1. Celebrex 200 mg p.o. q.d. 2. Prempro 0.025 mg/2.5 mg p.o. q.d. 3. Zyrtec 10 mg p.o. q.d. 4. ______ 0.1% to affected areas. 5. Ultravate ointment 0.05% to affected areas. 6. Albuterol inhaler p.r.n.  PROCEDURES:  On December 21, 1999, the patient underwent a right total knee arthroplasty by Dr. Joni Fears, assisted by Evert Kohl, P.A.C., under general  anesthesia.  There were no complications.  The patient was returned to the recovery room in satisfactory condition.  CONSULTATIONS:  On December 21, 1999, the patient had the following routine consults: 1. Physical therapy. 2. Occupational therapy. 3. Rehabilitation. 4. Pharmacy for routine Coumadin dosing.  HOSPITAL COURSE:  On December 21, 1999, the patient was admitted to [facility] under the care of Dr. Joni Fears.  The patient underwent a right total knee arthroplasty under general anesthesia without any complications. The patient was transferred to the recovery room and then to the orthopedic floor without further problems.  The patient then incurred a five-day postoperative course.  The patient had no significant complications postoperatively, other than the normal drop in her H&H.  Due to the patient donating two units of autologous blood, this was transfused back to the patient without any complications.  The patient tolerated this transfusion well.  The patient continued to do very well with her physical therapy but, due to the fact that she had a minimal amount of assistance at home, it was felt that a rehabilitation stay for a short period of time would be significantly beneficial to the patient.  The patients leg remained neuromotor vascularly intact.  The wound remained benign, with no signs of infection and no Homans sign.  The patient was, in fact, discharged to rehabilitation services on postoperative day #5.  LABORATORY DATA:  EKG on admission was normal sinus rhythm at 66 beats per minute.  Chest x-ray:  Impression was probable chronic bronchitis or COPD, no acute disease.  CBC on admission was WBC of 10.5, hematocrit 11.9, hematocrit 36.1, with platelets of 385.  On December 25, 1999, WBC was 7.7, hemoglobin 9.7, hematocrit 28.7, platelets of 265.  Coagulation studies on December 25, 1999, found PT at 19.8 with INR of 2.3.  Routine chemistries on December 24, 1999,  found potassium of 3.3 and calcium of 8.0, BUN 7, and creatinine 0.7, other values normal. Urinalysis on admission found small leukocyte esterase, few epithelial cells, with many bacteria.  The patient was transfused two units of autologous blood.  DISCHARGE MEDICATIONS ON TRANSFER TO REHABILITATION: 1. OxyContin CR 10 mg p.o. q.12h. 2. Coumadin per pharmacy dosing. 3. Claritin 10 mg p.o. q.d. 4. Provera 2.5 mg p.o. q.d. 5. Premarin 0.625 mg p.o. q.d. 6. Celebrex 200 mg p.o. q.d. 7. Docusate 100 mg p.o. b.i.d. 8. OxyIR 1 or 2 tablets every four to six hours p.r.n. pain.  CONDITION ON DISCHARGE TO REHABILITATION SERVICES:  Improved.DD:  01/20/00 TD:  01/22/00 Job: [id number]

## 2010-10-27 NOTE — Discharge Summary (Signed)
NAME:  Linda [name], Linda [name]                     ACCOUNT NO.:  [id number]   MEDICAL RECORD NO.:  [id number]                   PATIENT TYPE:  INP   LOCATION:  5024                                 FACILITY:  [facility] [place]   PHYSICIAN:  Vonna Kotyk. Durward Fortes, M.D.            DATE OF BIRTH:  07-11-1940   DATE OF ADMISSION:  10/23/2002  DATE OF DISCHARGE:  10/28/2002                                 DISCHARGE SUMMARY   ADMISSION DIAGNOSES:  1. Irrigation of right total knee arthroplasty with 5000 white counts,     probably due to Group B Streptococcus.  2. Chronic eczema.   DISCHARGE DIAGNOSES:  1. Status post right total knee arthroplasty removal of patellar component     in an infected right knee.  2. Candidiasis vaginalis.  3. Asthma.  4. Chronic eczema.   HISTORY OF PRESENT ILLNESS:  This is a 70 year old white female with a  history of a right total knee arthroplasty in July 2001.  The patient has  had previous complications with the right total knee, including previous  infection and allergic-type reaction with the prosthesis.  The previous  intervention was performed on the right knee.  Over the last year the  patient has been very happy with the results.  She reports on having  decreased pain and excellent range of motion, to the point where she was  able to return to work without significant problems; however, on or around  October 03, 2002, she started getting some general constitutional symptoms of  upset, general aching and not feeling well.  The next day she woke up having  difficulty with the range of motion and pain in the right leg with a rapid  onset of superficial cellulitic-type infection about the leg.  She subsequently was admitted to [facility] for  evaluation.  At that point in time she was found to have a Group-B  Streptococcus.  The right knee was aspirated and found to have frank pus.  She was taken to the operating room where she underwent an  arthroscopic  lavage and then was placed on IV antibiotics and her symptoms improved  rapidly.  She was discharged on Oct 14, 2002, and after her discharge the  pain increased.  She had increasing difficulty with range of motion.  She  states she has had some low-grade temperatures and night sweats.  She  returned for a re-evaluation by Dr. Vonna Kotyk. [place] in his office.  The  right knee was again aspirated and she was found to have 5000 white blood  cells in the synovial fluid by evaluation.  The patient was admitted to [facility] on Oct 23, 2002,  for an open I&D with debridement and __________ exchange and evaluation of  the prosthesis, in an attempt to save the right total knee arthroplasty.   ALLERGIES:  LASIX, ERYTHROMYCIN, KEFLEX, TEQUIN, HORSE SERUM, MULTIPLE FOOD  ALLERGIES  INCLUDING WALNUTS, PEAS, BEANS, ALL POULTRY, CARROTS, SWEET  POTATOES, EGGS, PORK AND FISH.   MEDICATIONS:  1. Zyrtec 10 mg p.o. q.h.s.  2. Atarax 50 mg p.o. q.h.s.  3. Multivitamins, Centrum one tab p.o. daily.  4. Iron 150 mg p.o. q.h.s.  5. Ultravate apply to areas p.r.n.  6. Celebrex 200 mg p.o. daily.  7. Lexapro 10 mg p.o. daily.   PROCEDURE:  The patient was taken to the operating room on Oct 23, 2002, by  Dr. Durward Fortes and assisted by Vonita Moss. Duffy, P.A.-C.  Under general  anesthesia irrigation with a polyethylene tool tray exchange and the removal  of the patella component was performed.  The patient tolerated the procedure  well and returned to the recovery room in a good and stable condition.   CONSULTATIONS:  1. PT and OT.  2. Infectious disease.  3. Case management.   HOSPITAL COURSE:  During her hospital stay the patient developed  postoperative anemia and was transfused with 2 units of packed red blood  cells.  The patient also developed Candidiasis vaginalis which was treated.  The patient also developed some nausea while on vancomycin.  Otherwise the   patient remained stable and in good condition, and was discharged on  postoperative day five to home with IV antibiotics.   LABORATORY DATA:  Routine labs on admission:  CBC revealed a white count of  8.3, hemoglobin 11.4, hematocrit 34.1, platelets 776.  Coags:  PT 2.9, INR  1.2, PTT 41.  Routine chemistry:  Sodium 140, potassium 4.3, chloride 106,  bicarbonate 25, glucose 92, BUN 9, creatinine 0.7.  AST 28, ALT 13, ALP 73,  total bilirubin 0.4.  A urinalysis was negative.  Blood cultures on Oct 24, 2002, showed no growth after five days.  Blood cultures on Oct 26, 2002,  were positive for Staphylococcus species, coagulase negative.  Right knee wound cultures:  Barium smear showed rare white blood cells  present, predominantly PMN, no organisms seen, no growth after two days.  Anaerobic cultures of right knee showed rare WBC present, predominantly PMN,  no organisms seen.  No anaerobes isolated.  Electrocardiogram on admission showed normal sinus rhythm with a heart rate  of 72 beats per minute.  The PR interval is 124 msec.  The PRT axis is 5126.   DISCHARGE MEDICATIONS:  The patient was to resume her home medications, and  add the following:  1. Diflucan 150 mg, take one tab p.o. daily, for a total of three days.  2. Percocet 5 mg, one tab p.o. q.4-6h. p.r.n. pain.  3. Rocephin 1 g IV daily __________  x6 weeks.  4. Rifampin 600 mg p.o. daily.   ACTIVITY:  Weightbearing as tolerated with the walker.   DIET:  No restrictions.   CONDITION ON DISCHARGE:  The patient was discharged in good and stable  condition.   FOLLOW UP:  The patient is to follow up with Dr. Durward Fortes in one week in  the office.  The patient is to call the office at [phone] for an  appointment.    WOUND CARE:  Keep the wound clean and dry.  No powders or lotions to the  wound.  Change the dressing every day.  The patient is to call the office if she develops any temperature of greater than 101.5 degrees,  chills,  swelling, foul-smelling drainage from the wound and/or pain which is not  controlled with the pain medicine.   SPECIAL CARE:  Daily IV  antibiotics at [place]  with Arville Go.      Erskine Emery, P.A.                       Vonna Kotyk. Durward Fortes, M.D.    GC/MEDQ  D:  01/04/2003  T:  01/05/2003  Job:  [id number]

## 2010-10-27 NOTE — Discharge Summary (Signed)
[place]. [facility]  Patient:    Linda [name], Linda [name]                    MRN: [id number] Adm. Date:  [id number] Disc. Date: 02/17/00 Attending:  Gilmore Laroche Dictator:   Lavon Paganini. De [place], P.A. CC:         Daniel L. Theda Sers, M.D., Rehabilitation  Joni Fears, M.D., Orthopaedic Surgery  [name], M.D., Medicine   Discharge Summary  DISCHARGE DIAGNOSES: 1. Left total knee arthroplasty 02/08/00. 2. Postoperative anemia. 3. Asthma. 4. Eczema. 5. Right total knee arthroplasty 7/01.  HISTORY OF PRESENT ILLNESS:  This is a 70 year old female with history of right total knee arthroplasty who now presented with ongoing left knee pain with no relief with conservative care.  X-rays with end-stage degenerative joint disease.  She underwent a left total knee arthroplasty 02/08/00 per Dr. Durward Fortes.  She was placed on Coumadin for deep vein thrombosis prophylaxis.  Partial weight bearing.  Central line in place for blood draws. Hypokalemia 3.0 and supplemented.  Minimal assist for ambulation.  No chest pain or shortness of breath.  Latest INR 1.9.  Chest x-ray with no active disease with chronic changes.  Admitted for comprehensive rehabilitation program.  PAST MEDICAL HISTORY:  See discharge diagnoses.  Occasional alcohol.  No tobacco.  ALLERGIES: E-MYCIN, KEFLEX, HORSE SERUM, LATEX, AND MULTI-FOOD ALLERGY.  PRIMARY M.D.: Dr. [name].  DERMATOLOGIST: Dr. Wilhemina Bonito.  MEDICATIONS PRIOR TO ADMISSION: 1. Celebrex 200 mg daily. 2. Prempro daily. 3. Zyrtec 10 mg daily. 4. Protopic ointment. 5. Ultravate ointment as needed. 6. Albuterol inhaler as needed.  PAST SURGICAL HISTORY: 1. Left ovary removed in 1962. 2. Appendectomy. 3. Tubal ligation. 4. Bilateral cataract surgery.  SOCIAL HISTORY:  Lives in a two level home.  No steps to entry. Stays on the first floor.  Independent prior to admission.  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.  Daughter in area works.  HOSPITAL COURSE:  Patient did well while on rehabilitation services with therapy initiated on a b.i.d. basis.  The following issues are followed during patients rehabilitation course.  Pertaining to [name] left knee total knee arthroplasty: Remained stable.  Surgical site healing nicely.  No signs of infection.  Staples had been removed.  Staples were to be removed by home health nurse.  She was partial weight bearing with a walker, independent in her room.  She continued on Coumadin for deep vein thrombosis prophylaxis. Venous Doppler studies prior to her discharge were negative. She will complete a four week protocol of Coumadin.  A home health nurse was arranged. Dr. Jarvis Morgan will continue to follow blood thinner.  She was using her inhalers as prior to hospital admission for her asthma.  She remained asymptomatic.  Lungs clear to auscultation. No shortness of breath.  She had a long history of eczema.  She was using the necessary ointments provided by her dermatologist, which was again without issue.  She had no bowel or bladder disturbances.  Overall for her functional mobility, she was ambulating extended distances with a walker, essentially independent to stand, by assist in all areas of activities of daily living, dressing, grooming, and homemaking.  Overall, her strength and endurance greatly improved and she was encouraged by her overall progress and discharged to home.  LABORATORY DATA: Latest labs showed an INR of 2.8, hemoglobin 9.4, hematocrit 27.4.  Sodium 136, potassium 3.9, BUN 9.0, creatinine  0.7.  DISCHARGE MEDICATIONS: 1. OxyContin CR 10 mg every 12 hours x 1 week. 2. Coumadin daily with dose to be established at time of discharge x 4 week    protocol. 3. Albuterol inhaler twice daily as needed. 4. Prempro daily. 5. Zyrtec daily. 6. Trinsicon twice daily. 7. Tylox as needed for pain.  ACTIVITY: Partial  weight bearing with walker.  DIET:  Regular.  WOUND CARE:  Cleanse incision daily, warm soap and water.  SPECIAL INSTRUCTIONS: No driving.  No aspirin or ibuprofen while on Coumadin. Home health nurse for prothrombin time on 02/19/00, results to Dr. Jarvis Morgan.  Home health nurse to remove staples on 02/19/00. Follow up with Dr. Durward Fortes as advised. DD:  02/16/00 TD:  02/17/00 Job: [id number]

## 2010-10-27 NOTE — Op Note (Signed)
[place]. [facility]  Patient:    Linda [name]                    MRN: [id number] Proc. Date: 02/08/00 Adm. Date:  [id number] Disc. Date: [id number] Attending:  Gilmore Laroche                           Operative Report  PREOPERATIVE DIAGNOSIS:  End-stage osteoarthritis of right knee.  POSTOPERATIVE DIAGNOSIS:  End-stage osteoarthritis of right knee.  OPERATION PERFORMED:  Left total knee replacement.  SURGEON:  Vonna Kotyk. Durward Fortes, M.D.  ASSISTANT:  Evert Kohl, P.A.  ANESTHESIA:  Spinal.  COMPLICATIONS:  None.  COMPONENTS:  Depuy LCS standard femoral component, standard plus rotating tibial platform.  Standard cruciate cemented patella and 10 mm bridging bearing.  All were secured with polymethyl methacrylate.  DESCRIPTION OF PROCEDURE:  With the patient comfortable on the operating table and under spinal anesthetic, the left lower extremity was placed in thigh tourniquet.  The leg was then prepped with Betadine scrub and then DuraPrep from the tourniquet to the ankle.  Sterile draping was performed.  With the extremity still elevated, it was Esmarch exsanguinated with the proximal tourniquet at 350 mmHg.  A midline longitudinal incision was made centered over the patella and extending from the tibial tubercle to the superior pouch by sharp dissection. The incision was carried down to the subcutaneous tissue.  The first layer of capsule was then incised in the midline.  The deep capsule was opened in the medial parapatellar fashion using the Bovie and the patella was everted 180 degrees and the knee flexed to 90 degrees.  There was a clear yellow joint effusion.  There were also moderately large osteophytes along the medial and lateral femoral condyles and to a lesser extend the medial tibial plateau. There was considerable loss of articular cartilage, particularly along the medial compartment.  Preoperatively, we had templated the  standard femoral component and standard plus tibial component.  These were confirmed intraoperatively.  The appropriate jigs were then applied to obtain the appropriate femoral and tibial cuts.  The 10 mm bridging bearing was used and was symmetric in both flexion and extension.  A 4 degree distal valgus cut was made.  Both ACL and PCL were sacrificed.  MCL and LCL remained intact.  The laminar spreader was inserted between the tibia and femur to debride both menisci as well as portions of the capsule posteriorly and remnants of anterior cruciate and posterior cruciate ligaments.  After the appropriate femoral and tibial cuts were made, the trial components were inserted including the standard femoral and standard plus rotating tibial platform with 10 mm bridging bearing was inserted.  The knee was then placed through a full range of motion and had slight hyperextension.  Could easily flex to 130 degrees without any malrotation of the tibial component.  There was no opening to varus and valgus stress.  The patella was then prepared by removing enough bone to leave 13 mm of thickness.  Cruciate backed jig was then applied to obtain the appropriate position of the patella and a 90 degree plane to the long axis of the femur. The trial patella was then inserted, the knee placed through a full range of motion.  There was no malrotation or lateral subluxation of the patella.  All the trial components were then removed.  The joint was copiously irrigated with jet saline  and antibiotic solution.  Each of the final components were then packed in with polymethyl methacrylate.  The standard femoral component, standard tibial, standard plus rotating tibial platform and a cruciate backed patella, 10 mm bridging bearing was used.  Extraneous methacrylate was removed.  The knee was placed in full extension.  This methacrylate was matured.  The patellar clamp was applied.  After hardening of  the methacrylate, the clamp was removed.  The knee was placed through a full range of motion.  There was no remaining methacrylate and the knee was quite stable.  Tourniquet was deflated.  Gross bleeders were Bovie coagulated.  Hemovac was not utilized.  Deep capsule was closed with 0 and #1 Tycron.  The superficial capsule closed with running 0 Vicryl, subcutaneous tissues with 0 and 2-0 Vicryl and skin closed with skin clips.  Sterile bulky dressing was applied followed by knee immobilizer.  The patient tolerated the procedure without complications.  She had +1 dorsalis pedis and posterior tibial pulses and good capillary refill to the toes in recovery room. DD:  03/20/00 TD:  03/21/00 Job: [id number]

## 2010-10-27 NOTE — H&P (Signed)
[place]. [facility]  Patient:    Linda [name], Linda [name]             MRN: [id number] Adm. Date:  12/21/99 Attending:  Vonna Kotyk. Durward Fortes, M.D. Dictator:   Evert Kohl, P.A.                         History and Physical  DATE OF BIRTH:  [birth year]  CHIEF COMPLAINT:  Bilateral knee pain since early 1990s.  HISTORY OF PRESENT ILLNESS:  The patient is a 70 year old white female with a history of bilateral knee pain since early 1990s.  The patient initially presented to an orthopedic surgeon in [place] in the early 1990s with a complaint of clicking in her bilateral knees.  The patient had x-rays taken. She was told she had bilateral knee replacements in her future.  The patient gradually had the clicking improve, but the pain in her knees gradually worsened over time.  The patient did have sharp, stabbing, shooting pains in her knee with awkward movements and weight bearing activities.  Otherwise, the pain was a constant aching sensation.  The patient was in to see Dr. Durward Fortes for evaluation.  The patient was placed on Celebrex and patient currently states the pain is tolerable.  Currently, the pain is more symptomatic in the left versus right, but the patient has a more symptomatic right-sided limp. The patient does have occasional swelling and a sensation like her left knee will give out.  The patient currently has no other mechanical symptoms.  The patient does have a moderate amount of difficulty going up and down stairs and in and out of car because of discomfort in her knees.  The patient has not had any previous surgical procedures done on either knee.  Currently, left knee is more symptomatic than right.  DRUG ALLERGIES:  ERYTHROMYCIN, KEFLEX, HORSE SERUM AND LATEX.  FOOD ALLERGIES:  ALL NUTS, ALL PEAS AND BEANS, ALL POULTRY, CARROTS, SWEET POTATOES, EGGS, PORK AND FIN FISH.  CURRENT MEDICATIONS: 1. Celebrex 200 mg p.o. q.d. 2.  Prempro 0.025 mg/2.5 mg p.o. q.d. 3. Zyrtec 10 mg p.o. q.d. 4. Protopic ointment 0.1% applied to affected areas q.d. 5. Ultravate ointment 0.05% applied to affected areas. 6. Albuterol inhaler p.r.n.  PAST MEDICAL HISTORY:  The patient has significant history of chronic eczema for which she is being treated by Dr. Wilhemina Bonito.  The patient currently has eczema on bilateral knees cleared up for this surgery.  The patient also has a history of asthma for which respiratory infections and heavy exertion triggers her symptoms.  She is currently well controlled on albuterol multidose inhaler.  The patient has also had an incidence of cellulitis in the left lateral aspect of her fibula this last April which was cleared up with levaquin.  The patient denies any history of diabetes, high blood pressure, thyroid disease, hiatal hernia, peptic ulcer, heart disease or any other GI or GU problems.  PAST SURGICAL HISTORY:  Left ovary removed in 1962 with an appendectomy. Tubal ligation with laparoscopy in 1981.  Bilateral cataract surgery in the early 1980s.  The patient denies any complications with the above mentioned surgical procedures.  SOCIAL HISTORY:  The patient is a 70 year old white female who denies any significant smoking history.  The patient does occasionally use alcohol, about one drink a week.  The patient is currently divorced.  She does have four grown children.  The patient lives  in a one-story house with no steps to the main entrance, but there are two short flights of four steps a piece in the main entrance and she does live by herself.  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  FAMILY PHYSICIAN:  Listed as [name], M.D., at [phone].  FAMILY MEDICAL HISTORY:  Mother is deceased at age of 41 from Alzheimers disease, diabetes, stroke, atrial fibrillation and hypertension medical problems.  Father is alive at 63 years  old with pulmonary fibrosis from smoking history and hypercholesterolemia.  The patient has one sister alive and well with a history of CVA and hypertension.  REVIEW OF SYSTEMS:  Positive for cat scratch fever approximately 12 years ago. The patient does use reading glasses at all times.  Otherwise the review of systems is negative and noncontributory for any sensory, respiratory, cardiac, GI, GU, hematologic, musculoskeletal, neurologic or mental status problems at this time.  The patient does have a living well and a power of attorney who is Donalynn Furlong.  PHYSICAL EXAMINATION:  VITAL SIGNS:  Pulse 64, blood pressure 158/98, temperature 98.6, respirations 16.  Height 5 feet 5-1/2 inches.  Weight 206 pounds.  GENERAL:  This is a slightly heavy-set female, very pleasant.  She is able to get on and off the exam table without much disability, but does appear to be in slight discomfort in doing so.  HEENT:  Head:  Normocephalic, atraumatic, nontender over maxillary or frontal sinuses.  Pupils are equal, round and reactive, accommodating light. Extraocular movements are intact.  Sclerae nonicteric.  Conjunctivae pink and moist.  Funduscopic exam reveals normal appearing vasculature and positive red reflex bilaterally.  External ears without deformities.  Canals patent.  TMs pearly gray.  Gross hearing is intact.  Nasal septum was deviated to the left. No polyps noted.  Mucous membranes were pink and moist.  Oral buccal mucosa was pink and moist without lesion.  Dentition was in fair repair.  Uvula was midline was symmetrical on phonation.  The patient was able to swallow without any difficulty.  NECK:  Supple.  No palpable lymphadenopathy.  Thyroid gland was nontender. Good range of motion over cervical spine without any difficulty or tenderness.  CHEST:  Lung sounds were clear and equal bilaterally.  No wheezes, rales, rhonchi or rubs noted.  HEART:  Regular rate and  rhythm, S1 and S2 auscultated.  No murmurs, rubs, or gallops noted.  ABDOMEN:  Round, soft, nontender, no hepatosplenomegaly palpable.  Bowel  sounds were present throughout.  The CVA was nontender to percussion.  EXTREMITIES:  Upper extremities were symmetrically size and shape with excellent range of motion of her shoulders, elbows and wrists bilaterally. Motor strength was symmetrical, 5/5 in all muscle groups.  Lower extremities:  The hips had full extension and flexion back to 90 degrees with 20 to 30 degrees internal and then external rotation.  There was no crepitus or mechanical symptoms in the hips.  Right and left knee were symmetrically size and shape.  There was no sign of erythema, ecchymosis or soft tissue swelling about the knees, but they were round, boggy appearing, had multiple areas of pigmentation changes, but no palpable effusion and no crepitus on the patella with range of motion.  The patient had 5 degrees short of full extension and flexion back to about 100 degrees.  No valgus-varus laxity.  No anterior or posterior drawer.  There was tenderness along bilateral medial  joint lines.  Caps were nontender.  Good dorsiflexion and plantar flexion bilateral ankles.  PERIPHEROVASCULAR:  Carotid pulses 2+, no bruits.  Radial and femoral pulses were 2+ bilaterally.  Dorsalis pedis pulses 1+.  There was 2+ pitting edema from the ankles up to the lower one-third of the tibia with no venostasis changes noted.  NEUROLOGIC:  The patient is conscious, alert and appropriate and held an easy conversation with the examiner.  Cranial nerves II-XII were grossly intact. Deep tendon reflexes of the upper extremities and lower extremities were symmetrical right to left.  The patient was grossly intact to light touch sensation head to toe.  BREAST/RECTAL/GENITOURINARY:  Exams were deferred at this time.  IMPRESSION: 1. Bilateral osteoarthritis of the knees. 2. Asthma. 3.  Chronic eczema. 4. History of multiple drug and food allergies.  PLAN:  The patient will be admitted to [facility] on July 12 under the care of Dr. Joni Fears.  The patient will undergo routine labs and tests prior to planned left total knee arthroplasty.  The patient has donated two units of autologous blood for this surgical procedure. DD:  12/19/99 TD:  12/19/99 Job: [id number]

## 2010-10-27 NOTE — H&P (Signed)
NAME:  Linda [name], Linda [name]                     ACCOUNT NO.:  [id number]   MEDICAL RECORD NO.:  [id number]                   PATIENT TYPE:  INP   LOCATION:  5506                                 FACILITY:  [facility]   PHYSICIAN:  Marzetta Merino, M.D.                   DATE OF BIRTH:  [birth year]   DATE OF ADMISSION:  10/04/2002  DATE OF DISCHARGE:                                HISTORY & PHYSICAL   CHIEF COMPLAINT:  Right lower extremity redness and pain.   HISTORY OF PRESENT ILLNESS:  This 70 year old white female with past medical  history significant for right total knee replacement revision secondary to  infection approximately 16 months ago is admitted for right lower extremity  cellulitis.  She was in her usual state of health until Thursday when she  noticed right anterior leg pain just below the knee at work.  She then  developed chills last night with nausea and vomiting.  She vomited four  times.  Today, she noted significant redness of the right lower extremity as  well as increasing pain, so she presented to the emergency department or  further evaluation.  In the emergency department, she was found to have a  temperature of 103.  She is admitted for further care.  Of note, her right  knee infection 16 months ago was treated with Vancomycin and Rocephin for at  least six weeks.  Cultures obtained from the aspirations were apparently  negative.   PAST MEDICAL HISTORY:  1. Asthma.  2. Eczema.  3. Status post right knee total knee replacement revision secondary to     infection.   MEDICATIONS:  1. Aspirin 81 mg p.o. daily.  2. Zyrtec 10 mg p.o. daily.  3. Atarax 10 mg q.h.s.  4. Multivitamin daily.  5. Lexapro 10 mg daily.  6. Iron p.o. daily.  7. Tylenol as needed.  8. Albuterol MDI as needed.   ALLERGIES:  KEFLEX, TEQUIN, ERYTHROMYCIN.   PAST SURGICAL HISTORY:  1. Left ovary removal.  2. Right breast lumpectomy.  3. Cholecystectomy 2003.  4. Bilateral total knee  replacement.   FAMILY HISTORY:  Noncontributory.   SOCIAL HISTORY:  Divorced with four children, occasional alcohol, no  tobacco.  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   PHYSICAL EXAMINATION:  VITAL SIGNS:  Temperature 103, pulse 92, respirations  20, blood pressure 158/66, 97% saturation on room air.  GENERAL APPEARANCE:  Well-developed, overweight female in no acute distress.  Alert and pleasant demeanor.  HEENT:  Pupils equal response, reactive to light.  Nares are clear.  Oropharynx clear.  No masses or lesions noted.  NECK:  Supple.  No adenopathy.  No increased jugular venous distention.  No  carotid bruits.  No thyromegaly.  CARDIOVASCULAR:  Regular rate and rhythm.  S1, S2  without murmurs, rubs or  gallops.  LUNGS:  Clear to auscultation, bilaterally.  ABDOMEN:  Bowel sounds present, soft, nontender, nondistended.  No  hepatosplenomegaly.  No palpable masses.  No bruits.  RECTAL:  Deferred.  EXTREMITIES:  The right lower extremity has significant amount of erythema  and warmth in the lower tibial region with some evidence of redness and  warmth as well just above the knee cap, 2+ dorsalis pedis and posterior  pulses in both lower extremities,  1+ pitting edema in the right lower  extremity.  NEUROLOGICAL:  Grossly intact.   LABORATORY DATA:  On 4/25, white blood cell count 18.6, hemoglobin 12.1,  platelets 257.  Sodium 133, potassium 3.8, chloride 101, bicarbonate 26, BUN  15, creatinine 0.9, glucose 131, albumin 3.1, bilirubin 0.5, alk-phos 75,  AST 22, ALT 14.   IMPRESSION:  A 70 year old white female with history of right total knee  replacement revision secondary to infection.  This is noted with right lower  extremity cellulitis.  There is some extension to the right knee surface  with erythema and warmth raising concerns again about possible right knee   involvement, specifically involving the hardware.   PLAN:  1. ID/Orthopedics.  She will be moved to a regular bed.  2. We will check a sed rate and CRP.  3. She will have x-rays for the right tibia, fibula and right knee.  4. Based on her antibiotic coverage at her last admission, we will start     Vancomycin 1 g q.12h. and check a vancomycin level after the third dose.     We will also start Rocephin 1 g q.24h. which she has tolerated in the     past.  We will also add Clindamycin for the initial therapy based on the     severity of her cellulitis.  5. We will have Orthopedics evaluate the patient based on her history of     requiring a right knee revision.  6. She may also need a bone scan if she does not improve for evaluation of     osteomyelitis.  7. She will be given morphine for pain.                                               Marzetta Merino, M.D.    BJ/MEDQ  D:  10/04/2002  T:  10/05/2002  Job:  [id number]

## 2010-10-27 NOTE — H&P (Signed)
NAME:  Linda [name], Linda [name]                     ACCOUNT NO.:  [id number]   MEDICAL RECORD NO.:  [id number]                   PATIENT TYPE:  AMB   LOCATION:                                       FACILITY:  [facility]   PHYSICIAN:  Vonna Kotyk. Durward Fortes, M.D.            DATE OF BIRTH:   DATE OF ADMISSION:  10/23/2002  DATE OF DISCHARGE:                                HISTORY & PHYSICAL   CHIEF COMPLAINT:  Right total knee arthroplasty with recent diagnosis of  infection.   HISTORY OF PRESENT ILLNESS:  The patient is a 70 year old white female with  a history of right total knee arthroplasty in 7/01.  The patient has had  quite a complicated course with this right total knee including previous  infections, and allergic type reactions with the prosthesis.  The patient  has had a revision performed in the right total knee arthroplasty but over  this last one year patient has been very happy with the results.  He has  been having decreased pain and she has had excellent range of motion to the  point where she has been able to return to work without any significant  problems.  The patient indicates that around 10/03/02 she started with some  general constitutional symptoms of upset, general aching and not feeling  well.  The following day she woke up having difficulty with range of motion  and pain in her right leg with rapid onset of superficial cellulitic type  infections about the leg.  The patient was admitted to [facility]  for evaluation.  She was found to have a group B strep.  The right total  knee was aspirated, found to have frank pus.  She was taken to the OR where  she was arthroscopically lavaged and then placed on IV antibiotics and had  rapid improvement of her symptoms.  The patient was discharged on 10/14/02 but  her pain with her knee and difficulty with range of motion progressively  worsened after her discharge.  The patient indicates that she has been  having some  low-grade temperatures and night sweats and she returned for  evaluation by Dr. Durward Fortes.  Another aspiration was performed and she was  found to have 50,000 wbc's in the synovial fluid evaluation.  Dr. Durward Fortes  will be admitting her for an open I&D with debridement and poly exchange and  evaluation of the prosthesis in an attempt to save the right total knee  arthroplasty.   ALLERGIES:  1. Lasix.  2. Erythromycin.  3. Keflex.  4. Tequin.  5. Horse serum.  6. Multiple food allergies including walnuts, peas, beans, all poultry,     carrots, sweet potatoes, eggs, pork and fin fish.   CURRENT MEDICATIONS:  1. Zyrtec 10 mg p.o. q.h.s.  2. Atarax 50 mg p.o. q.h.s.  3. Multivitamins, Centrum Silver one tablet p.o. daily.  4. Iron 150 mg p.o. q.h.s.  5. Ultravate applied to areas p.r.n.  6. Celebrex 200 mg p.o. daily.  7. Lexapro 10 mg p.o. daily.   PAST MEDICAL HISTORY:  1. Asthma.  2. Chronic eczema.  3. Multiple recurrent problems with her right total knee arthroplasty.   PREVIOUS SURGICAL HISTORY:  1. Removal of left ovary in '62.  2. Fallopian tubes tied in '81.  3. Right total knee arthroplasty in 2001.  4. Left total knee arthroplasty in 2001.  5. Lumpectomy of right breast in '90.  6. Cholecystectomy in 2003.  7. Revision of right total knee arthroplasty in 11/02.  8. Arthroscopic irrigation of infected right total knee arthroplasty in     4/04.  The patient indicates that the only complications she has had in the past is  some stress reactions due to close proximity of anesthesia with the right  and left total knee arthroplasty, causing an immune reaction and loss of  hair.   SOCIAL HISTORY:  The patient is a 70 year old white female who denies any  history of smoking.  She has a couple of alcoholic beverages a month.  She  is divorced.  She has four grown children.  She lives in a one story home.  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 Rose Fillers, [phone].  DERMATOLOGIST: Dr. Wilhemina Bonito.   FAMILY HISTORY:  Mother is deceased from congestive heart failure,  Alzheimer's and diabetes.  Father is deceased  due to old age.  The patient  has one sister in good health.   REVIEW OF SYSTEMS:  Positive for low-grade temperatures, night sweats, pain  with range of motion.  Her eczema had been improved but she did have a flare  up and cellulitic reaction with the last incident.  The rest of review of  systems are negative.   PHYSICAL EXAMINATION:  VITAL SIGNS: Height is 5 feet, 4 inches, weight 192  pounds.  Pulse 80 and regular, respirations 16, blood pressure is 128/80.  Temperature is 99.0.  GENERAL: This is a well-developed, healthy appearing white female.  Due to  previous contacts with this patient I feel she is looking overall medically  a lot healthier now than she has in the past.  She is ambulating with a cane  in her right hand and she does ambulate with a slight limp due to difficulty  with range of motion of her right knee.  HEENT: Head was normocephalic, atraumatic, nontender over maxillary and  frontal sinuses.  She has a good healthy head of hair at this time.  Pupils  are equal, round and reactive to light.  Extraocular movements are intact.  Sclerae anicteric.  Conjunctivae pink and moist. External ears without  deformity, canals patent, tympanic membranes pearly gray and intact.  Nasal  septum is midline.  Oral buccal mucosa was pink and most without lesions.  Dentition is in good repair. Patient is able to swallow without any  difficulty.  NECK: Supple, no palpable lymphadenopathy.  Thyroid region was nontender.  She has good range of motion of her cervical spine without any tenderness.  She had no tenderness along the spinal column.  CHEST: Lung sounds were clear and equal bilaterally, no wheezes, rales,  rhonchi or rubs noted.  HEART: Regular rate and  rhythm, S1 and S2. ABDOMEN: Round, soft, nontender with normal active bowel sounds throughout.  UPPER EXTREMITIES: Upper extremities are symmetric in size and shape, she  had excellent range of  motion of elbows, shoulder and wrists.  LOWER EXTREMITIES: Right and left hip had full extension, flexion up to 100  degrees with good internal, external rotation.  Right knee at the present  time looks fairly well, no sign of erythema or ecchymosis of soft tissue  swelling though she did have some areas of healing scabs on the lower  extremities and small area of resolving eczema.  The surgical incision is  well-healed, there is no sign of erythema or ecchymosis about the joint, it  is slightly warm and is tender to palpation. Range of motion was not  assessed.  Left total knee arthroplasty incision is well-healed.  The left  knee looks excellent with no signs of infection, no increased warmth.  She  is nontender throughout the joint and has good range of motion.  Peripheral vascular and carotid pulses were 2+, radial pulses 2+, patient  had warm pink and brisk capillary refill in the lower extremities.  NEUROLOGIC: The patient was conscious, alert and appropriate, had good  sensation with the examiner, she had no gross neurologic defects.  BREAST/RECTAL/GU EXAM: Deferred at this time.   IMPRESSION:  1. Infection right total knee arthroplasty with 50,000 white cells probably     due to group B strep.  2. Asthma.  3. Chronic eczema.   PLAN:  The patient will be admitted to [facility] on 10/23/2002  under the care of Joni Fears, M.D.  At that time Dr. Durward Fortes will  open up her right total knee arthroplasty, do an extensive synovectomy and  irrigation and replacement of polyethelene in her right total knee.  He will  also evaluate the prosthesis for any loosening or problems.  The patient  will be also evaluated by infectious disease in the hospital for evaluation  and  recommendations of antibiotic treatment.  Otherwise the patient will  undergo all routine labs and tests prior to this surgical procedure.       Evert Kohl, P.A.                      Vonna Kotyk. Durward Fortes, M.D.    RWK/MEDQ  D:  10/21/2002  T:  10/21/2002  Job:  [id number]

## 2010-10-27 NOTE — Discharge Summary (Signed)
[place]. [facility]  Patient:    Linda [name], Linda [name]                    MRN: [id number] Adm. Date:  [id number] Disc. Date: [id number] Attending:  Gilmore Laroche Dictator:   Evert Kohl, P.A.                           Discharge Summary  ADMISSION DIAGNOSES: 1. End-stage osteoarthritis left knee. 2. Status post right total knee arthroplasty. 3. Asthma. 4. Chronic eczema. 5. History of multiple drug and food allergies.  HISTORY OF PRESENT ILLNESS:  This is a 70 year old female with a history of bilateral knee pain since early 90s.  The patient had a right total knee arthroplasty performed in July 2001.  The patient had very good results and has requested her left knee be replaced.  The patient has pain with any weightbearing activities.  The patient initially did have mechanical symptoms, but currently does not.  The pain is described as an aching sensation and increases up and down stairs.  ALLERGIES:  LATEX, ERYTHROMYCIN, KEFLEX, HORSE SERUM.  FOOD ALLERGIES:  NUTS, PEAS, BEANS, POULTRY, CARROTS, SWEET POTATOES, EGGS, PORK, AND FIN FISH.  CURRENT MEDICATIONS: 1. Celebrex 200 mg p.o. q.h.s. 2. Prempro 0.025/2.5 mg p.o. q.d. 3. Zyrtec 10 mg p.o. q.d. 4. Protopic ointment 0.1% to affected areas. 5. Ultravate ointment 0.05% to affected areas. 6. Multidose albuterol inhaler.  SURGICAL PROCEDURE:  On February 08, 2000, the patient was taken to the OR by Dr. Joni Fears, assisted by Evert Kohl, P.A.C.  Under spinal analgesia, the patient underwent a left total knee arthroplasty.  There were no complications, and the patient was transferred to the recovery room in good condition.  CONSULTS:  On February 08, 2000, the following routine consults were requested: Physical therapy, occupational therapy, rehab, and pharmacy for Coumadin dosing for routine DVT prophylaxis.  HOSPITAL COURSE:  On February 08, 2000, the patient was admitted to [facility] under the care of Dr. Joni Fears.  The patient was taken to the OR where a left total knee arthroplasty was performed.  The patient tolerated the procedure well, and was transferred to the recovery room in good condition.  The patient then proceeded to have an uneventful five day postoperative course.  The patients left lower extremity remained neuromotor vascularly intact.  The wound remained benign with no signs of infection.  The patients H&H remained stable.  She progressed slowly with physical therapy, so rehab was requested and approved.  The patient was transferred to rehab services on postop day #5.  LABORATORY DATA:  Chest x-ray on admission showed chronic bronchitis/COPD, no active disease.  CBC on admission was WBCs 8.5, hemoglobin 12, hematocrit 36.8, platelets of 378.  On February 11, 2000, WBCs 8.4, hemoglobin 9.6, hematocrit 28.4, platelets 291.  Coag studies on February 12, 2000, PT was 17.7 with an INR of 1.8.  Routine chemistries on February 12, 2000, found potassium of 3.2, calcium of 8.3, other values normal.  Routine urinalysis on admission found 15 ketones, many epithelial cells, many bacteria, leukocyte esterase was small, and nitrites was negative.  DISCHARGE MEDICATIONS TO SUBACUTE CARE UNIT: 1. OxyContin CR 20 mg p.o. q.12h. 2. Coumadin per pharmacy dosing. 3. Claritin 10 mg p.o. q.d. 4. Premarin 0.625 mg p.o. q.d. 5. Provera 2.5 mg p.o. q.d. 6. Docusate 100 mg p.o. b.i.d. 7. Albuterol inhaler 2  puffs p.o. b.i.d. 8. Micro-K 10 mEq p.o. b.i.d. x 48 hours. 9. Percocet one or two tablets q.4-6h. p.r.n. pain.  CONDITION ON DISCHARGE TO SUBACUTE CARE UNIT:  Improved. DD:  02/24/00 TD:  02/26/00 Job: [id number]

## 2010-10-27 NOTE — Op Note (Signed)
NAME:  Linda [name], Linda [name]           ACCOUNT NO.:  [id number]   MEDICAL RECORD NO.:  [id number]          PATIENT TYPE:  AMB   LOCATION:  ENDO                         FACILITY:  [facility]   PHYSICIAN:  Jeryl Columbia, M.D.    DATE OF BIRTH:  [birth year]   DATE OF PROCEDURE:  01/31/2006  DATE OF DISCHARGE:                                 OPERATIVE REPORT   PROCEDURE:  Colonoscopy   INDICATIONS:  History of colon polyps due for repeat screening.  Family  history of colon polyps as well.  Consent was signed after risks, benefits,  methods, options thoroughly discussed multiple times in the past.   MEDICINES USED:  Fentanyl 90, Versed 7.   PROCEDURE:  Rectal inspection is pertinent for external hemorrhoids, small.  Digital exam was negative.  Video pediatric adjustable colonoscope was  inserted and with some abdominal pressure was able to be advanced to the  cecum.  On insertion, some left-sided diverticula were seen but no other  abnormalities.  Cecum was identified by the appendiceal orifice and  ileocecal valve.  We rolled her on her back to get an even better look at  the cecum since she had polyps in the cecum before, but none were seen.  The  scope was slowly withdrawn.  Prep was adequate.  There was some liquid stool  that required washing and suctioning.  On slow withdrawal through the colon,  other than left-sided diverticula, no other abnormalities were seen.  So, we  withdrew back to the rectum where a tiny polyp was seen and was cold  biopsied x3.  Anorectal pull-through and retroflexion confirmed some small  hemorrhoids.  Scope was straightened and readvanced short ways up the left  side of the colon.  Air was suctioned, scope removed.  The patient tolerated  the procedure well.  There was no obvious immediate complication.   ENDOSCOPIC DIAGNOSES:  1. Internal-external hemorrhoids.  2. Occasional left-sided diverticula.  3. Tiny rectal polyp, cold biopsied.  4. Otherwise within  normal limits to the cecum.   PLAN:  Await pathology but probably recheck colon screening in 5 years.  Happy to see back p.r.n. Otherwise return care to Dr. Laurann Montana for customary  health care, screening and maintenance.           ______________________________  Jeryl Columbia, M.D.     MEM/MEDQ  D:  01/31/2006  T:  01/31/2006  Job:  [id number]

## 2010-10-27 NOTE — H&P (Signed)
[place]. [facility]  Patient:    TACOMA, COUSER Visit Number: [id number] MRN: [id number]          Service Type: MED Location: [id number] Attending Physician:  Sherrin Daisy Dictated by:   Joyice Faster. Oletta Lamas, M.D. Admit Date:  11/11/2001   CC:         Delanna Ahmadi, M.D.  Coralie Keens, M.D.   History and Physical  DATE OF BIRTH: 18-Sep-1940  REASON FOR ADMISSION: Acute biliary colic.  HISTORY OF PRESENT ILLNESS: This 70 year old was doing fine this afternoon and had sudden onset epigastric pain.  No nausea or vomiting.  She was brought to the emergency room and ultrasound obtained showed gallstones with dilated intrahepatic ducts, with common bile duct 14 mm, with no clear evidence of a stone.  The patient had had some Tequin at home which she had taken a dose or two of at home for an infected tear duct over the past couple of days, so she had been on Tequin.  She is feeling a bit better after Dilaudid.  Laboratories were remarkable for a normal WBC and hemoglobin.  Total bilirubin was normal. Alkaline phosphatase was up a little at 126.  SGOT and SGPT were two to three times normal.  I reviewed the ultrasound with Dr. Zigmund Daniel and clearly the gallbladder is full with multiple shadow and stones.  The common bile duct is quite enlarged.  The pancreas appears normal.  There were dilated intrahepatic ducts as well.  The patient is clinically feeling a bit better.  CURRENT MEDICATIONS:  1. Tequin one or two doses.  2. Atarax.  3. Zyrtec.  4. Albuterol two puffs b.i.d.  5. Multivitamin.  ALLERGIES:  1. KEFLEX.  2. LATEX.  PAST MEDICAL HISTORY:  1. History of asthma.  2. Multiple allergies.  3. Eczema.  She has no other chronic medical problems.  PAST SURGICAL HISTORY:  1. Total knee replacement complicated by osteomyelitis requiring long-term     antibiotics.  After this was cleared up she had this revised.  2. Left  oophorectomy with appendectomy.  3. Subsequent right tubal ligation.  4. Removal of small malignant breast lump.  FAMILY HISTORY: Mother died with diabetes, blood pressure problems, had Alzheimers and stroke, etc.  Father is 63 and healthy.  Mother had gallstones.  She has a daughter that is healthy.  SOCIAL HISTORY: She does not smoke.  Drinks alcohol occasionally.  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  REVIEW OF SYSTEMS: Remarkable for complete lack of fatty food intolerance, diarrhea, postprandial nausea, etc. prior to this episode.  PHYSICAL EXAMINATION:  VITAL SIGNS: Temperature 98 degrees, blood pressure 159/77.  GENERAL: Very pleasant nonicteric white female.  HEART: Regular rate and rhythm without murmurs or gallops.  LUNGS: Clear.  ABDOMEN: Soft with mild epigastric tenderness.  Good bowel sounds.  ASSESSMENT: Biliary colic with questionable common duct stone.  The patient does have a markedly dilated common duct and certainly could have a common duct stone.  Her liver tests are really not high enough, however, to strongly suggest this.  PLAN:  1. Will admit.  2. Will give Tequin.  3. Keep her NPO.  4. Check her laboratories in the morning and if much better she will likely     go on to surgery.  If not, she will likely need a preoperative ERCP. Dictated by:   Joyice Faster. Oletta Lamas, M.D. Attending Physician:  Sherrin Daisy DD:  11/11/01 TD:  11/13/01 Job: [id number]

## 2010-10-27 NOTE — Discharge Summary (Signed)
[place]. [facility]  Patient:    Linda [name], Linda [name] Visit Number: [id number] MRN: [id number]          Service Type: MED Location: [id number] Attending Physician:  Reinaldo Berber Dictated by:   Zenaida Deed, P.A.-C. Admit Date:  05/07/2001 Disc. Date: 05/19/01                             Discharge Summary  ADDENDUM:  MEDICATIONS: 1. She is to be started on Procrit 40,000 units subcu once a week x 3 weeks.    Her first dose should be given in the hospital on May 18, 2001. 2. Ferrous sulfate needs to be decreased to 1 tablet p.o. q.d.  She is being started on the Procrit to help bump her hemoglobin and hematocrit before her next surgical procedure.  We need to have a CBC checked in the nursing home in approximately December 15 or 16.  That can be faxed to Dr. Wonda Horner office.  She needs all other follow-up and such as previously dictated in the other discharge summary. Dictated by:   Zenaida Deed, P.A.-C. Attending Physician:  Reinaldo Berber DD:  05/18/01 TD:  05/18/01 Job: [id number]

## 2010-10-27 NOTE — Discharge Summary (Signed)
NAME:  Linda [name], Linda [name]                     ACCOUNT NO.:  [id number]   MEDICAL RECORD NO.:  [id number]                   PATIENT TYPE:  INP   LOCATION:  5007                                 FACILITY:  [facility]   PHYSICIAN:  Debbora Lacrosse, M.D.                   DATE OF BIRTH:  [birth year]   DATE OF ADMISSION:  11/18/2002  DATE OF DISCHARGE:  11/19/2002                                 DISCHARGE SUMMARY   DISCHARGE DIAGNOSES:  1. Right knee, mild erythema, with mild to moderate edema, which is probably     secondary to allergic reaction due to topical application.  2. Right total knee replacement in November of 2002 for osteoarthritis.  3. Infection status post revision in January of 2003.  4. Status post debridement of cellulitis in April of 2004 and repeat     debridement in May of 2004.  5. History of eczema, multiple allergies including allergies to __________ .   DISCHARGE MEDICATIONS:  1. Rifampin 600 mg one p.o. q.h.s.  2. Ceftriaxone 2 g IV once daily at [facility]  3. __________  10 mg one p.o. daily.  4. Celebrex one p.o. daily.  5. Albuterol MDI p.r.n.  6. Zyrtec 10 mg one p.o. daily.  7. Atarax 25 mg one p.o. q.h.s.  8. Aspirin 81 mg one p.o. daily.  9. Iron Sulfate 325 mg one p.o. b.i.d.  10.      Multivitamins one p.o. daily.  11.      Zantac one p.o. daily.   PAIN MANAGEMENT:  Vicodin and Percocet.   PROCEDURES PERFORMED IN HOSPITAL:  None.   CONSULTATIONS:  Consultations were obtained from:  1. Orthopedics for a right knee aspirate which showed no organisms and a few     WBCs.  Very thin serous material was obtained.  She did not appear to     have intra-articular infection.  2. Also, a Dermatology consult was obtained.  3. Punch biopsy was obtained.  4. ID followed with Dr. __________ to the patient's pain doctor, Dr.     Johnnye Sima.   FOLLOWUP:  She is to follow up with Dr. Johnnye Sima on December 02, 2002, at 11:15  a.m., and with Dr. Durward Fortes next week  and she has an appointment for that.   BRIEF ADMITTING HISTORY AND PHYSICAL:  [name] is a 70 year old white  female with a past medical history significant for asthma and eczema, who  presented with a five-day history of progressive worsening of right knee  swelling and tightness with no significant pain.  She denied any fever,  chills, nausea, vomiting, or trauma to the right knee.  She developed a rash  in the right shin over the past two days.  The patient has had bilateral  knee replacements for osteoarthritis.  The left knee replacement was  uncomplicated in September of 2001, but the right knee was done in  September  of 2001.  She subsequently developed infection and underwent removal of the  TKR with spacer implant in November of 2002.  Then the right TKR was done  again in January of 2003.  The patient did well until April of 2004 when she  developed cellulitis and eczema and knee effusions.  She underwent  synovectomy in April of 2004.  The cultures grew GBS.  Started on rifampin,  as well as ceftriaxone.  On Oct 23, 2002, she developed fevers and decreased  range of movement.  The knee aspirate showed about 50,000 WBCs __________ .  She underwent I&D and removal of the patellar component.  She had been doing  well until the presentation on November 18, 2002, with swelling of the right  knee.   ALLERGIES:  She is allergic to:  1. LATEX.  2. ERYTHROMYCIN.  3. TEQUIN.  [place].  6. PEANUTS.  7. POULTRY.  8. PEAS.  9. BEANS.  10.      FISH.  11.      CARROTS.  12.      PORK.  13.      BANANAS.   PAST MEDICAL HISTORY:  1. Asthma.  2. Eczema.  3. Osteoporosis.  4. History of Aspergillus in January of 1994 secondary to steroid use.  5. Colonic adenomatous polyp, Dr. Barrie Dunker, status post polypectomy.  6. Status post left dermoid cyst.  7. Oophorectomy.  8. Status post laparoscopic cholecystectomy for cholecystitis in June of     2003.  9. Right breast  lumpectomy in 1998.  10.      Knee replacement as above.   SUBSTANCE ABUSE:  She is a former smoker and smoked about two to three  cigarettes per day for the past 10 years.  She quit about two years ago.  Alcohol:  Two to three drinks a day.  No cocaine.  No IV drugs.   SOCIAL HISTORY:  She is divorced.  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  She lives with a dog and four children.   FAMILY HISTORY:  Mother died at 79 of hypertension, diabetes, and CVAs.  Father is alive at 28 and is a physician.  Siblings:  One sister has  hemorrhagic CVA.   PHYSICAL EXAMINATION:  VITAL SIGNS:  She has a temperature of 98 degrees,  respiratory rate 16, and O2 saturation 99% on room air.  GENERAL APPEARANCE:  She is an awake, alert, obese, black female in no acute  distress.  HEENT:  Eyes:  PERRLA.  Extraocular movements intact.  Moist oropharynx.  NECK:  No JVD.  No thyromegaly.  RESPIRATORY:  Clear bilaterally.  Occasional wheeze.  CARDIOVASCULAR:  Regular rate and rhythm.  No murmurs, rubs, or gallops.  GASTROINTESTINAL:  Soft.  Bowel sounds are heard.  Nontender and  nondistended.  No organomegaly.  Obese.  EXTREMITIES:  Right knee midline scar, swollen and mildly tender.  GENITOURINARY:  Deferred.  SKIN:  Some rash and erythema in the area of the PICC line.   HOSPITAL COURSE:  RIGHT KNEE SWELLING:  The patient was on Rocephin.  It was  thought maybe that she had cellulitis and was started on vancomycin.  We got  a dermatology consult.  She had a punch biopsy.  As well, there was an  orthopedic consult for aspiration of the joint, which showed no organisms  with a few WBCs.  It was very thin and did not seem infected.  Secondary to  this, it was thought that the reason there was redness and swelling was  probably to an infection or allergic rash as the aspirate was negative. Therefore it was decided to continue with her ceftriaxone and rifampin as  per her outpatient schedule, as well as maybe  add an H2 blocker to her  Zyrtec and advise her against topical use of any kind.  She is to follow up  in the ID clinic as per her schedule, unless problems with schedule.                                               Debbora Lacrosse, M.D.    TS/MEDQ  D:  11/19/2002  T:  11/20/2002  Job:  [id number]   cc:   Doroteo Bradford. Johnnye Sima, M.D.  [place]  [place] [address]  Fax: [place] Durward Fortes, M.D.  [address] [place]  [place] [address]  Fax: [place] Ronnald Ramp, M.D.  [address] [place]  [place] [address]  Fax: [phone]

## 2010-10-27 NOTE — Discharge Summary (Signed)
[place]  Patient:    Linda [name], SPRINGLE Visit Number: [id number] MRN: [id number]          Service Type: MED Location: [id number] Attending Physician:  Reinaldo Berber Dictated by:   Junius Argyle, PA Admit Date:  05/07/2001 Discharge Date: 05/19/2001   CC:         Delanna Ahmadi, M.D.  Vonna Kotyk. Durward Fortes, M.D.  Doroteo Bradford. Johnnye Sima, M.D.   Discharge Summary  DISCHARGE DIAGNOSES: 1. Right total knee hardware removal with antibiotic spacer placement. 2. Postoperative anemia. 3. History of asthma. 4. History of eczema.  HISTORY OF PRESENT ILLNESS:  Ms. Laural Roes is a 70 year old female, well known to rehab from her prior stays.  She has history of bilateral total knee replacement 07/01 and 08/01, earlier this year was noted to have problems with swelling and increasing right knee pain.  Work-up showed loosening of components with question of osteomyelitis.  Has been treated with antibiotics on outpatient basis.  On 04/23/01, the patient underwent removal of prosthesis with placement of antibiotic spacer by Dr. Durward Fortes, started on IV vancomycin and Rocephin on 11/16.  PICC line placed today for long-term antibiotic treatment.  She has received two units packed red blood cells secondary to drop in H&H despite attempt on 11/17 and pan-cultured.  Currently afebrile. She is supervision for transfers, supervision for ambulating 60 feet with standard walker.  Noted to have problems with hypertension and nausea and vomiting.  PAST MEDICAL HISTORY:  See discharge diagnoses, plus history of constipation, DJD, idiopathic alopecic, and oophorectomy.  ALLERGIES:  ERYTHROMYCIN, KEFLEX, LATEX.  SOCIAL HISTORY:  The patient lives alone in two-level home.  No steps at entry.  Independent with cane and walker prior to admission.  Does not use any tobacco, uses alcohol occasionally.  HOSPITAL COURSE:  Ms. Jahlia Readus was  admitted to rehab on 04/28/01 for inpatient therapies to consist of PT/OT daily.  Pastadmission she was maintained on Coumadin for DVT prophylaxis.  A supplement was added for her postoperative anemia.  She has had problems with pain control and was started on OxyContin p.o. with good relief.  The patient did very well in her therapies initially.  She did have a temperature spike on 11/23 and defervesced without any change in her regimen.  A UA, UC&S has been checked x 3 and has shown no growth.  Blood cultures also have shown no growth.  CBC of 11/25 showed white count at 9.8, neutrophils 68, lymphs 12, hemoglobin 9.7, hematocrit 28.6, platelets high 26.  Stools guaiac x 3 were negative. Electrolytes showed sodium 135, potassium 3.7, chloride 97, CO2 32, BUN 7, creatinine 0.7, glucose 98.  LFTs within normal limits.  The patients knee healed well without any signs or symptoms of infection, no drainage, no erythema noted.  The patient made good progress during her stay.  She was at modified independent for transfers, modified independent for ambulating 100 feet with rolling walker on even and uneven surfaces.  She was modified independent for ADLs including toileting and simple home management tasks.  She was independent in her room for approximately 48 hours prior to discharge.  The patient and family had concerns regarding her discharged to home and need for supervision.  The patients progress was reiterated to both patient and her family with supervision recommended for personal preference.  The patient to continue with follow-up PT/OT by [facility].  The patient to continue on IV Rocephin  and vancomycin for 6 weeks total through 06/07/01.  On 05/05/01, the patient is discharged to home.  DISCHARGE MEDICATIONS:  1. Zyrtec 10 mg per day.  2. Atarax 50 mg q.h.s.  3. Flovent 110 mcg 2 puffs b.i.d.  4. Albuterol 2 puffs b.i.d.  5. Os-Cal 50, 1 p.o. b.i.d.  6.  Trinsicon 1 p.o. b.i.d.  7. Coumadin 5 mg q.h.s.  8. OxyContin CR to taper from 30 b.i.d. to 20 once a day in the next 3 weeks.  9. Oxycodone IR 5-10 mg p.o. q.4-6h. p.r.n. pain. 10. IV vancomycin and Rocephin to continue through 06/07/01.  ACTIVITY:  Partial weightbearing on right lower extremity.  Use walker and wear TEDS to keep edema down.  SPECIAL INSTRUCTIONS:  Weekly CBC with differential, CMET and vancomycin level to Dr. Johnnye Sima on Mondays.  Home health PT by [facility].  FOLLOW-UP:  Patient is to follow up with Dr. Durward Fortes in 7-10 days, follow up with Dr. Johnnye Sima for a check in the next few weeks, follow up with Dr. Jonette Eva as needed. Dictated by:   Junius Argyle, PA Attending Physician:  Reinaldo Berber DD:  05/28/01 TD:  05/29/01 Job: [id number]

## 2010-10-27 NOTE — Op Note (Signed)
[place]  Patient:    Linda [name], Linda [name] Visit Number: [id number] MRN: [id number]          Service Type: SUR Location: [place] 01 Attending Physician:  Reinaldo Berber Dictated by:   Vonna Kotyk. Durward Fortes, M.D. Proc. Date: 06/19/01 Admit Date:  06/19/2001                             Operative Report  PREOPERATIVE DIAGNOSIS:  Failed, infected right total knee replacement, status post removal x 7 weeks.  POSTOPERATIVE DIAGNOSIS:  Failed, infected right total knee replacement, status post removal x 7 weeks.  OPERATION PERFORMED:  Revision exchange, right total knee replacement.  SURGEON:  Vonna Kotyk. Durward Fortes, M.D.  ASSISTANT:  Lara Mulch, M.D.  COMPLICATIONS:  None.  ANESTHESIA:  COMPONENTS:  Depuy PFC Sigma knee system with a 2.5 mm titanium tray with a 12 x 75 mm tibial fluted rod and a 10 mm stabilized tibial insert.  A #3 right cobalt chrome femur with a 16 x 125 fluted femoral stem.  Wedges employed were 8 mm wedge lateral distal femur 4 mm wedge medial distal femur and 4 mm wedges both medial and lateral posterior femur and a 10 mm wedge along the medial tibial plateau. An oval dome 3-peg UHMWPE oval dome patella.  All were cemented with Palacos polymethyl methacrylate with 1.2 gm of Tobramycin.  DESCRIPTION OF PROCEDURE:  With the patient comfortable on the operating table and under general orotracheal anesthesia, a nonlatex Foley catheter was inserted.  The right lower extremity was then placed in a thigh tourniquet. The leg was then prepped with Betadine scrub and DuraPrep from the tourniquet to the midfoot.  Sterile draping was performed.  With the extremity still elevated, it was Esmarch exsanguinated with the proximal tourniquet at 350 mHg.  Previous midline longitudinal incision was utilized and then via sharp dissection  carried down to subcutaneous tissue.  A medial parapatellar incision was made through the  deep capsule using the previous incision as identified by Tycron sutures.  The Tycron sutures were easily  removed from both the medial and lateral aspects of the incision.  The joint was entered. There was a clear yellow joint effusion.  48 hours prior to the surgery, the knee aspiration was performed.  There were 315 white blood cells identified. They were predominantly mononuclear.  There was no organisms identified. C-reactive protein had decreased from 10.9 a month ago to 2.8 preoperatively.  A crude polymethyl methacrylate femoral and tibial knee replacement component had been fashioned at the last surgery seven weeks ago.  With added tobramycin the prosthesis the knee was flexed and these methacrylate prostheses were easily removed with the osteotome from both the femur and the tibia.  We had lost very little bone.  Soft tissue from around the bone was then removed with a rongeur and a curet.  There was no evidence of obvious infection.  We applied a temporary staple to the patellar tendon to prevent avulsion and did a lateral release to allow 180 degrees eversion of the patella.  The soft tissue was stripped from the tibia medially and very little dissection was performed posteriorly.  Initially, we reamed the tibia and the femur.  We measured a 75 mm length 12 mm wide stem in the tibia and a 16 mm wide and 125 mm length stem in the femur.  We made a 3 degree posterior cut  in the tibia and then applied the trial component.  We fit nicely on the lateral surface but we required a 10 mm wedge medially which allowed excellent position.  We did template a 624THL flexion and extension gap preoperatively which were symmetrical.  We then trialed the femur.  The fluted rod was inserted.  We made several finishing cuts anteriorly, distally, and posteriorly and felt that the #3 femur was a perfect fit.  We used the box cut for the intercondylar notch cut and decided that we required 4 mm  posterior wedges and 8 mm wedge laterally, a 4 mm wedge medially and that we had re-established the jointline.  We inserted the trial components with the above wedge additions, then inserted a 12 mm bridging bearing.  We felt that we lacked some extension.  With the 10 mm bridging bearing we had excellent medial and lateral stability and we had full extension without hyperextension.  The patella was then prepared by drilling 3 peg holes.  The trial patella was applied.  As we placed the knee through a full range of motion we really had excellent position.  Initially, we had lateral position of the femur but we were able to readjust it by making a separate intercondylar notch cut in place in the femur more laterally.  There was one small bone defect in the lateral femoral condyle that was filled in with autogenous bone from the femoral bone cuts.  We felt that the trial components were in excellent position.  They were then removed.  We copiously irrigated the joint with a jet saline lavage.  The final tibial component was then applied with Palacos cement using 1.2 gm of Tobramycin.  The stem was applied as well as the 10 mm wedge medially and then it was impacted flush on the tibia.  Extraneous methacrylate was removed.  The final femoral component was then assembled with the flute and the wedges as described above and then secured with polymethyl methacrylate.  Methacrylate was not applied to either one of the fluted stems, i.e. femur or tibia.  The joint was then placed in full extension.  Extraneous methacrylate was removed. The polyethylene patella was applied with methacrylate.  Extraneous methacrylate was removed.  After complete maturation of the methacrylate, the joint was then explored.  We were able to flex well beyond 95 degrees without any malrotation.  There was no opening with a varus or valgus stress.  There were a few areas of extraneous hard methacrylate which were  removed. Tourniquet was deflated at two hours and eight minutes.  Small bleeders were Bovie coagulated.  The Hemovac was inserted. The deep capsule was closed with  interrupted #1 Ethibond.  The superficial capsule was closed with running #0 Vicryl, the subcutaneous with 2-0 Vicryl.  The skin closed with skin clips.  The patient is to receive a postoperative femoral nerve block.  The patient tolerated the procedure without complications. Dictated by:   Vonna Kotyk. Durward Fortes, M.D. Attending Physician:  Reinaldo Berber DD:  06/19/01 TD:  06/19/01 Job: [id number]

## 2010-10-27 NOTE — Op Note (Signed)
[place]. [facility]  Patient:    Linda [name], Linda [name] Visit Number: [id number] MRN: [id number]          Service Type: MED Location: [id number] Attending Physician:  Sherrin Daisy Dictated by:   Judeth Horn, M.D. Proc. Date: 11/13/01 Admit Date:  11/11/2001 Discharge Date: 11/15/2001                             Operative Report  PREOPERATIVE DIAGNOSIS:  Acute cholecystitis with dilated common bile duct.  POSTOPERATIVE DIAGNOSIS:  Acute cholecystitis with dilated common bile duct.  PROCEDURE:  Laparoscopic cholecystectomy with intraoperative cholangiogram.  SURGEON:  Judeth Horn, M.D.  ASSISTANT:  Haywood Lasso, M.D.  ANESTHESIA:  General endotracheal.  ESTIMATED BLOOD LOSS:  100 cc.  COMPLICATIONS:  Obstructed distal tapering common bile duct.  CONDITION:  Stable.  FINDINGS:  The patient had an acutely inflamed gallbladder with adhesions to surrounding omentum and the duodenum.  The patient also had on cholangiogram, a markedly dilated common bile duct, and intrahepatic duct with no emptying into the duodenum, but without overt obstructing stone in the distal duct.  I related the findings to Dr. Clarene Essex who had done her preoperative ERCP and sphincterotomy, and these findings are not completely unexpected.  DESCRIPTION OF PROCEDURE:  The patient was taken to the operating room and placed on the table in the supine position.  After an adequate endotracheal anesthetic was administered, she was prepped and draped in the usual sterile manner, exposing the midline and the right upper quadrant of the abdomen.  We used latex-free material because of the latex allergy reported by the patient. A supraumbilical curvilinear incision was made using a #11 blade, and once we were in that supraumbilical plane, an umbilical hernia was noted.  We dissected out the hernia sac, excised it, and then we had a free entrance into the peritoneal  cavity, through which the 11 mm and 12 mm cannula was passed without deploying its cutting needle.  We subsequently insufflated carbon dioxide gas through the cannula into the peritoneal cavity up to a maximum intra-abdominal pressure of 15 mmHg.  We then passed two right costal margin 5 mm cannulas in the subxiphoid, the 11 mm and 12 mm cannula into the peritoneal cavity under direct vision.  The patient was placed in the reversed Trendelenburg position, the left side was tilted down, and the dissection begun.  During the course of retracting the gallbladder toward the right upper quadrant and anterior abdominal wall, adhesions to the lateral inferior aspect of the liver were torn, causing slight bleeding from the torn liver capsule. This was subsequently cauterized and stopped bleeding on his own.  We grasped the gallbladder, retracted it, and retracted the infundibulum, and then dissected out the peritoneum overlying what appeared to be the distal common and distal gallbladder and proximal cystic duct.  No actual cystic gallbladder or lymph node was noted.  However, we did open the plane between what appeared to be a tortuous infundibulum and cystic duct and the gallbladder.  In this triangle of Calot, the cystic artery was doubly clipped and ligated, and then subsequently transected.  We stayed high on the gallbladder as we placed the second clip and made a cholecysto_________ through which a Reddick catheter was subsequently passed and did a cholangiogram demonstrating the findings mentioned earlier.  The patient had a markedly dilated common bile duct. There was flow proximally  and distally.  However, there was no flow into the duodenum.  We removed the catheter subsequently as we were retracting to remove the catheter, the gallbladder tore at the cholecystoductotomy site.  It was easily retrieved and subsequently four clips were placed on the distal cystic duct. We then dissected  the gallbladder out its bed with minimal difficulty using an Endocatch bag to bring it out through the supraumbilical fascia.  There was spillage of bile and some bleeding during the early part of the procedure which was irrigated out with 2 L of warm saline.  Subsequently, there was noted to be no evidence of biliary leakage, drainage, or bleeding.  Once this was done, we removed all cannulas, and aspirated most of the gas from the top of the liver using the aspirator.  The supraumbilical fascia was closed using a figure-of-eight stitch of 0 Vicryl.  The skin was closed using running subcuticular stitch of 4-0 Vicryl except for the lateral cannula site where the skin was closed with Steri-Strips only.  Quarter percent Marcaine with epinephrine was injected at all sites.  All needle counts, sponge counts, and instrument counts were correct and sterile dressings were applied. Dictated by:   Judeth Horn, M.D. Attending Physician:  Sherrin Daisy DD:  11/13/01 TD:  11/16/01 Job: [id number]

## 2010-10-27 NOTE — Procedures (Signed)
[place]. [facility]  Patient:    Linda [name], Linda [name] Visit Number: [id number] MRN: [id number]          Service Type: MED Location: [id number] Attending Physician:  Sherrin Daisy Dictated by:   Jeryl Columbia, M.D. Proc. Date: 11/12/01 Admit Date:  11/11/2001   CC:         Jeneen Rinks L. Oletta Lamas, M.D.  Judeth Horn, M.D.  Delanna Ahmadi, M.D.   Procedure Report  PROCEDURE:  Endoscopic retrograde cholangiopancreatography with sphincterotomy and balloon pull-through.  SURGEON:  Jeryl Columbia, M.D.  INDICATIONS:  Questionable CBD stone.  Consent was signed after risks, benefits, methods, and options were thoroughly discussed with both the surgeons, Dr. Oletta Lamas and myself prior to the procedure.  MEDICINES USED:  Demerol 60 and Versed 6.  DESCRIPTION OF PROCEDURE:  The side viewing video therapeutic duodenoscope was inserted by direct vision into the stomach, and advanced through a normal antrum and normal pylorus into the duodenum.  We did see a normal-appearing minor ampulla as well as a normal-appearing major ampulla was brought into view.  Using a triple-lumen sphincterotome, we were able to get selective CBD cannulation.  It did seem to have a distal smooth stricture with a slight dilated CBD, but no obvious stones on initial injection.  We went ahead and advanced the Jagwire deep into the duct, and we were able to get deep selective cannulation.  The cystic duct did fill and was tortuous and gallstones were seen.  We did advance the sphincterotome to the bifurcation and filled the intrahepatics again without obvious abnormality.  Based on the dilated ducts and the distal stricture, we went ahead and did a moderate-sized sphincterotomy until we were able to get some drainage, and easily advance the three-quarters _______ sphincterotome in and out of the duct which was done without any obvious bleeding or complication.  We went ahead and  exchanged the sphincterotome for the 8.5 mm balloon in the customary fashion, making sure to keep the wire in the proper location under fluoroscopy.  Unfortunately, on the first balloon pull-through, there was some resistance at the ampulla and the balloon popped.  We went ahead and no debris or stones were delivered.  We went ahead and inserted the adjustable balloon and proceeded with multiple balloon pull throughs and occlusion cholangiograms, although there was some minimal air in the system.  We did not feel that there were any stones seen.  Multiple images were obtained after multiple balloon pull-throughs and slow balloon withdrawals under fluoroscopy. Will inject the contrast.  We elected to stop the procedure.  The scope was removed.  The patient tolerated the procedure well.  There was no obvious immediate complications.  ENDOSCOPIC DIAGNOSES: 1. Normal ampulla and minor papilla. 2. No pancreatic duct injections. 3. Distal smooth short stricture. 4. Mild dilated common bile duct. 5. Tortuous cystic duct. 6. Gallstones. 7. No obvious common bile duct stones, status post moderate sphincterotomy and    multiple balloon pull-throughs.  PLAN:  Observe for delayed complications.  If none, proceed with a laparoscopic cholecystectomy, and would consider an intraoperative cholangiogram, again to reevaluate the stricture, and also to evaluate her drainage, although identifying stones may be difficult due to increased air secondary due to the sphincterotomy.  Will repeat labs in the a.m. Dictated by:   Jeryl Columbia, M.D. Attending Physician:  Sherrin Daisy DD:  11/12/01 TD:  11/14/01 Job: [id number]

## 2010-10-27 NOTE — Discharge Summary (Signed)
Linda [name], [name]           ACCOUNT NO.:  [id number]   MEDICAL RECORD NO.:  [id number]          PATIENT TYPE:  INP   LOCATION:  5020                         FACILITY:  [facility]   PHYSICIAN:  Vonna Kotyk. Whitfield, M.D.DATE OF BIRTH:  1941-05-10   DATE OF ADMISSION:  01/13/2008  DATE OF DISCHARGE:  01/17/2008                               DISCHARGE SUMMARY   ADMISSION DIAGNOSIS:  Possible infected right total knee arthroplasty.   DISCHARGE DIAGNOSES:  1. Possible infected right total knee arthroplasty.  2. Posthemorrhagic anemia.  3. Obesity.  4. Hypertension.  5. Asthma.  6. LATEX allergy.   PROCEDURE:  Incision and drainage of right knee with synovectomy and  poly exchange.   HISTORY:  This is a very pleasant 70 year old white female status post  revision total knee arthroplasty in May 2004 with probable group B  strep.  She has been prophylactic doxycycline and rifampin.  She  presented on January 09, 2008, with a history of right knee pain and  feeling ill.  Aspiration of the knee revealed 18,885 white cells, 92  polys, 7 monos, and a glucose of 68 with a sed rate of 67 and CRP of  7.3.  Because of her history, she was indicated for I&D of the right  total knee with poly exchange.  Cultures of January 09, 2008, revealed no  growth x3 days.   HOSPITAL COURSE:  A 70 year old female admitted on January 13, 2008.  After appropriate laboratory studies were obtained, she was taken to the  operating room on January 13, 2008, where she underwent I&D of the right  total knee as well as a poly exchange and synovectomy.  She tolerated  the procedure well.  She was placed on a Dilaudid reduced-dose PCA pump.  She was begun on Lovenox 30 mg subcu q.12 h., starting at 8 a.m. on  January 14, 2008.  Coumadin per pharmacy protocol.  Consults with PT, OT,  and Care Management were made.  Ambulate weightbearing as tolerated on  the right knee.  Foley was placed intraoperatively.  Dr. Johnnye Sima was  called with the patient's location.  [name] was consulted  for medical care also.  She was placed on vancomycin 1500 mg IV q.12 h.  She was allowed out of bed to chair the following day.  Placed on a  liquid diet until her bowel sounds improved.  Respiratory therapy for  pulmonary toilet.  Lovenox instruction was started.  She was weaned off  her PCA pump.  A PICC line was placed because of difficulties with IV  access.  She was continued on 300 mg of rifampin daily by Dr. Johnnye Sima.  Urinalysis ordered on January 14, 2008.  The remainder of her hospital  course was uneventful.  She did have some nausea on January 16, 2008.  This was treated with Zofran 4 mg IV.  Her vancomycin was discontinued  on January 16, 2008, and her PICC line was also discontinued.  She was  placed on doxycycline 100 mg p.o. b.i.d.  She was then discharged on  January 17, 2008, to return back in followup  in our office.  She was  discharged in improved condition.   RADIOGRAPHIC STUDIES:  Chest x-ray revealed PICC line in proximal  superior vena cava at the junction with the right atrium.  There were no  laboratory studies at the time of this dictation in the chart.   DISCHARGE INSTRUCTIONS:  No restriction in her diet.  Keep her incision  clean and dry and change dressing daily.  Increase activity slowly.  Use  her walker, weightbearing as tolerated.  May shower on Saturday.  No  lifting or driving for 6 weeks.  Prescription Percocet 5/325 one to two  tabs every 4 as needed for pain, Robaxin 500 mg 1 tab every 6 as needed  for spasm, and Coumadin 5 mg as directed by [facility] pharmacist.  Ventolin inhaler twice a day.  Antibiotics as per Dr. Johnnye Sima.  Calcium  and D daily.  Follow up with Infectious Disease in 2-3 weeks.  Call for  an appointment.  Celexa 20 mg daily, doxycycline 100 mg every 12 hours,  rifampin 300 mg daily, simvastatin 40 mg daily, Synthroid 75 mg daily,  Advair Diskus 250/500 daily.  Call for  temperatures greater than 101  degrees.  Follow back up on January 23, 2008, with Dr. Durward Fortes.  Infectious Disease in 2-3 weeks.  [facility] for home health.  Discharged  in improved condition.      Mike Craze Petrarca, P.A.-C.      Vonna Kotyk. Durward Fortes, M.D.  Electronically Signed    BDP/MEDQ  D:  02/24/2008  T:  02/24/2008  Job:  [id number]

## 2010-10-27 NOTE — Op Note (Signed)
NAME:  Linda [name], Linda [name]                     ACCOUNT NO.:  [id number]   MEDICAL RECORD NO.:  [id number]                   PATIENT TYPE:  AMB   LOCATION:  ENDO                                 [facility]ACILITY:  [facility]   PHYSICIAN:  Jeryl Columbia, M.D.                 DATE OF BIRTH:  12/10/1940   DATE OF PROCEDURE:  05/01/2002  DATE OF DISCHARGE:                                 OPERATIVE REPORT   PROCEDURE:  Colonoscopy with polypectomy.   INDICATIONS:  Screening.  Consent was signed after risks, benefits, methods,  and options thoroughly discussed multiple times in the past.   MEDICATIONS:  Demerol 70 mg, Versed 7 mg.   DESCRIPTION OF PROCEDURE:  Rectal inspection was pertinent for external  hemorrhoids.  Digital exam was negative.  The video pediatric adjustable  colonoscope was inserted and despite a tortuous, long, looping colon I was  able to advance to the cecum without any position changes or abdominal  pressure.  On insertion some left-sided diverticula were seen but no other  abnormalities.  The cecum was identified by the appendiceal orifice and the  ileocecal valve.  The prep was fairly adequate.  There was stool adherent to  the wall, particularly on the right, and lots of liquid stool requiring lots  of washing and suctioning.  In the cecal pole a tiny 1-2 mm polyp was seen  and was cold biopsied x2.  Just next to the ileocecal valve was a small  polyp, which was snared, electrocautery applied, and the polyp was suctioned  through the scope and collected in the trap.  They were put in the first  container.  The scope was slowly withdrawn.  The ascending was normal.  In  the mid-descending a tiny polyp was seen and was hot biopsied x1 and put in  a second container.  The scope was slowly withdrawn.  At the approximate  level of the sigmoid-descending junction, a semi-sessile small polyp was  seen, snare electrocautery applied, and the polyp was suctioned through the  scope and  collected in the trap.  The left-sided diverticula were confirmed.  The scope was withdrawn back to the rectum.  Another small rectal polyp was  seen, snared, electrocautery applied, and this polyp was suctioned through  the scope and collected in the trap, put in a separate container as well.  The scope was retroflexed, revealing some internal hemorrhoids.  The scope  was straightened and readvanced a short way up the left side of the colon,  air was suctioned, the scope removed.  The patient tolerated the procedure  well.  There was no obvious immediate complication.   ENDOSCOPIC DIAGNOSES:  1. Internal-external hemorrhoids.  2. Left-sided diverticula.  3. Tortuous colon.  4. Five polyps seen, tiny in the cecum cold biopsied, small in the cecum     snared, tiny in the transverse hot biopsied, small snared in the distal  descending, as well as a small snared in the rectum.  5. Otherwise within normal limits to the cecum.    PLAN:  Await pathology to determine future colonic screening.  Ten-day  customary postpolypectomy instructions.  Happy to see back p.r.n., otherwise  return care to Dr. Laurann [place] for the customary health care maintenance to  include yearly rectals and guaiacs.                                               Jeryl Columbia, M.D.    MEM/MEDQ  D:  05/01/2002  T:  05/01/2002  Job:  [id number]   cc:   Delanna Ahmadi, M.D.  [address] [place] [address]  Fax: [phone]

## 2010-10-27 NOTE — Op Note (Signed)
[name]. [facility]  Patient:    Linda [name], Linda [name] Visit Number: [id number] MRN: [id number]          Service Type: SUR Location: [place] Attending Physician:  Reinaldo Berber Dictated by:   Vonna Kotyk. Durward Fortes, M.D. Proc. Date: 04/23/01 Admit Date:  04/23/2001                             Operative Report  PREOPERATIVE DIAGNOSIS:  Painful loose right total knee replacement.  POSTOPERATIVE DIAGNOSIS:  Infected right total knee replacement.  PROCEDURE:  Synovial debridement, right knee, and removal of total knee replacement components, with insertion of polymethacrylate antibiotic spacers.  SURGEON:  Vonna Kotyk. Durward Fortes, M.D.  ASSISTANT:  Lara Mulch, M.D.  ANESTHESIA:  General orotracheal.  COMPLICATIONS:  None.  DESCRIPTION OF PROCEDURE:  With the patient comfortable on the operating table under general orotracheal anesthesia, a catheter was inserted by the nursing staff.  The tourniquet was then applied to the right lower extremity.  The right lower extremity was then prepped with Betadine scrub and then Duraprep, a tourniquet to the ankle.  Sterile draping was performed.  With the extremity elevated, it was Esmarch exsanguinated with a proximal tourniquet at 350 mmHg. The patient has a history of latex allergy, and care was taken to avoid any latex products.  The previous total knee incision was utilized and via sharp dissection carried down through the subcutaneous tissue.  The first layer of capsule and scar was then excised in the midline.  A medial parapatellar incision was made through the old incision.  The old Tycron sutures were carefully removed.  There was probably 10 cc of clear yellow joint effusion.  After medial and lateral dissection, the patella was everted 180 degrees and the knee flexed to 90 degrees.  There was synovial erosion around the femoral and tibial components. They were obviously loosened.  We then  carefully removed each of the compartments using an oscillating saw and the thin osteotomes with very little bone loss.  There was some remaining methacrylate in both the femur and the tibia, and this was removed as well.  Specimens of synovial tissue were sent from both the femur and the tibia to pathology.  The first two specimens revealed few, if any, white cells and no organisms.  The last specimen sent from the tibia revealed numerous white cells, probably 20 in number per high-power field with no organisms identified.  It was felt that this most likely represented an infection and accordingly, revision total knee replacement was not performed.  A complete synovectomy was performed.  We debrided abnormal tissue from around the femur and the tibia.  The joint was then copiously irrigated with jet saline lavage and antibiotic solution.  We felt we had an excellent debridement.  The patellar component was also removed.  We did not see any remaining methacrylate on either the tibia, the patella, or the femur.  Palacos polymethyl methacrylate was then used to make femoral and tibial components.  One vial of tobramycin was then inserted with each of the three packs of polymethyl methacrylate.  The femoral and tibial prosthesis were then molded over the femur and the tibia to give the patient some stability and motion.  After maturation, the knee was then placed through a range of motion. We could easily provide about 45 degrees of flexion.  She had good stability medially to lateral.  At  that point the tourniquet was deflated, gross bleeders were Bovie coagulated.  A Hemovac was inserted.  The deep capsule was closed with interrupted 0 Tycron, superficial capsule closed with 2-0 Vicryl, and skin closed with skin clips.  A sterile bulky dressing was applied, followed by the thigh-high stockings and a knee immobilizer.  The patient tolerated the procedure without complications. Dictated by:    Vonna Kotyk. Durward Fortes, M.D. Attending Physician:  Reinaldo Berber DD:  04/23/01 TD:  04/23/01 Job: [id number]

## 2010-10-27 NOTE — Discharge Summary (Signed)
[place]. [facility]  Patient:    Linda [name], Linda [name] Visit Number: [id number] MRN: [id number]          Service Type: MED Location: [id number] Attending Physician:  Sherrin Daisy Dictated by:   Judeth Horn, M.D. Admit Date:  11/11/2001 Discharge Date: 11/15/2001                             Discharge Summary  DISCHARGE DIAGNOSES: 1. Acute cholecystitis. 2. Dilated common bile duct.  PROCEDURES: 1. Laparoscopic cholecystectomy with intraoperative cholangiogram. 2. Endoscopic retrograde cholangiopancreatography without evidence of common    duct stones.  DIET:  Regular.  CONDITION ON DISCHARGE:  Discharged home doing well.  FOLLOWUP:  To see me in the office in two weeks.  DISCHARGE MEDICATIONS: Vicodin for pain at home.  HOSPITAL COURSE:  The patient was admitted and seen initially by the gastroenterologist who felt the patient had a common duct stone.  She was consulted on by general surgery by Dr. Ninfa Linden initially on 11/12/01.  I saw the patient later on that day and scheduled her for surgery on 11/13/01, at which time she underwent a laparoscopic cholecystectomy with a cholangiogram. Dr. Margot Chimes assisted.  Postoperatively, she had abnormal liver function tests, however, these improved after 48 hours and she was discharged home.  Her wounds were healing well, she was tolerating her diet well.  She was to return to see me in two weeks. Dictated by:   Judeth Horn, M.D. Attending Physician:  Sherrin Daisy DD:  12/23/01 TD:  12/25/01 Job: [id number]

## 2010-10-27 NOTE — Discharge Summary (Signed)
NAME:  Linda [name], Linda [name]                     ACCOUNT NO.:  [id number]   MEDICAL RECORD NO.:  [id number]                   PATIENT TYPE:  INP   LOCATION:  5506                                 FACILITY:  [facility]   PHYSICIAN:  Delanna Ahmadi, M.D.               DATE OF BIRTH:  September 20, 1940   DATE OF ADMISSION:  10/04/2002  DATE OF DISCHARGE:  10/14/2002                                 DISCHARGE SUMMARY   REASON FOR ADMISSION:  This is a 70 year old white female with history of  right knee total replacement, with a subsequent right knee infection  requiring revision January 2003.  She was admitted with a right lower  extremity cellulitis.  Patient was in her usual state of health until four  days prior to admission, when she noticed anterior leg pain just below the  knee and then developed chills the night before admission with nausea and  vomiting x 4.  She noticed erythema of the right leg with increased pain and  presented to the emergency room, where she was found to have a temperature  of 103.   SIGNIFICANT FINDINGS:  VITAL SIGNS:  On admission, temperature 103, heart  rate 92, respirations 20, blood pressure 158/66.  EXTREMITIES:  Right leg  showed significant erythema and warmth over the lower leg and ankle with  severe pain with motion in the knee.   LABORATORY DATA:  On admission:  CBC with WBC 18.6, hemoglobin 12.1,  platelet count 257 with a left shift.  Chemistries:  Sodium 132, potassium  3.8, chloride 101, bicarbonate 26, glucose 131, BUN 15, creatinine 0.9,  calcium 8.5, total protein 6.5, albumin 3.1, AST 22, ALT 14, alk phos 75,  total bilirubin 0.5, CRP 163, ESR 62.   Right knee films show lucency along the margin of the tibia.  Total knee  prosthesis component suspicious for loosening or infection.  There is  sclerosis along the tibial plateau.   HOSPITAL COURSE:  The patient was admitted for right lower leg cellulitis,  rule out right knee septic arthritis.  She  was placed on Vancomycin,  Rocephin and clindamycin.  Blood cultures were drawn and remain negative.   The patient was seen by her orthopedist, Dr. Vonna Kotyk. Durward Fortes, on April  26th.  He aspirated the right knee and obtained 20 cc of purulent material.  The patient was taken on the evening of April 26 for an arthroscopic lavage  and synovectomy of the right knee, which was done without complication by  Dr. Durward Fortes.   The patient was seen by Dr. Johnnye Sima of Infectious Disease.  Antibiotics were  changed to Vancomycin and Rifampin with clindamycin and ceftriaxone  discontinued.  A PICC line was placed.   [name] right knee fluid cultures eventually grew out group B  streptococcus, which was sensitive to penicillin, Vancomycin and  Levofloxacin.  Vancomycin was discontinued, and she was placed on  ceftriaxone and  rifampin for the rest of her hospitalization.  The patient  gradually defervesced.  She was given oral and then IV narcotics for pain  control in her knee as well as headache.  Her knee pain gradually resolved.   She was seen by physical therapy and was able to ambulate at discharge.   Her cellulitis in the lower right leg totally resolved within several days.   She had some mild diarrhea, which resolved.  She developed some mild  hypokalemia secondary to diet, and this was repleted.   Dr. Durward Fortes compared the current knee films with those of June of 2003 and  did not feel like there was any significant change.  He recommended  monitoring her course with IV antibiotics in the short term.  He recommended  that if there were any increase in temperature, increase in knee pain or  change in exam, then he would consider an open debridement and poly  exchange.  The patient remained afebrile, and her knee pain continued to  improve by discharge.  She was discharged on April 5th in good condition.   FINAL RECOMMENDATIONS:  A 4-6-week course of Rocephin and rifampin and  then  a month's more of a two-drug oral regimen.   DISCHARGE DIAGNOSES:  1. Right knee septic arthritis.  2. Right leg cellulitis.  3. Eczema.  4. Asthma.   PROCEDURES:  1. Right knee aspiration.  2. Right knee arthroscopic lavage and synovectomy.  3. PICC line placement.   DISCHARGE MEDICATIONS:  1. Rocephin 1 gm IV q.24h.  2. Rifampin 300 mg 2 p.o. q.d.  3. Celebrex 200 mg 1 p.o. q.d.  4. Aspirin 81 mg 1 p.o. q.d.  5. Zyrtec 10 mg 1 p.o. q.d.  6. Atarax 2 mg 1 p.o. q.h.s.  7. Multivitamin q.d.  8. Lexapro 10 mg p.o. q.d.  9. Iron 1 p.o. q.d.  10.      Albuterol MDI 2 puffs q.4-6h. p.r.n.   ACTIVITY:  1. As tolerated.  2. Can return to work the week of May 10th.   DISCHARGE DIET:  Regular.    FOLLOW UP:  1. Two weeks with Dr. Johnnye Sima.  2. May 10th with Dr. Durward Fortes.  3. Daily IV antibiotics have been arranged at [facility].                                               Delanna Ahmadi, M.D.    JJG/MEDQ  D:  10/14/2002  T:  10/15/2002  Job:  [id number]   cc:   Vonna Kotyk. Durward Fortes, M.D.  [address] [place]  [place] [address]  Fax: [place] Johnnye Sima, M.D.  [place]  [place] [address]  Fax: [phone]

## 2010-10-27 NOTE — H&P (Signed)
[facility]  Patient:    Linda, [name] Visit Number: [id number] MRN: [id number]          Service Type: EXP Location: [facility] Attending Physician:  Lacretia Leigh Dictated by:   Evert Kohl, P.A. Admit Date:  01/04/2001 Discharge Date: 01/04/2001                           History and Physical  DATE OF BIRTH:  03-25-1941  CHIEF COMPLAINT:  Painful right total knee arthroplasty.  HISTORY OF PRESENT ILLNESS:  Linda [name] is a 70 year old white female with a history of right total knee arthroplasty in July 2001.  The patient initially did have a period from August 2001 to January 2002 in which she did very well with her right total knee.  Around the first of 2002 patient started noticing a twinging-type feeling with first weightbearing in the morning. After standing a few moments it eventually relieved itself and the patient was able to go about her ways without any problems.  This eventually progressed to become very excruciating first thing in the morning with the first step off for the first three to four months and then progressed to the point where it is a constant, excruciating pain with any weightbearing throughout the day. The patient states it feels like she is ambulating on a broken bone with sharp pains that are radiating down the tibia along the medial aspect.  Patient has had swelling in the knee, increased warmth, and she has noted decreased range of motion due to the swelling.  Patient also notes that her knee would be very tender to any type of palpation about the knee.  She did have an unstable sensation in her knee as if the knee would give out while ambulating and she has been ambulating over the last several months with the use of a cane or walker, ever since being out of work since September.  The patient has had significant thorough workup for loosening of the prosthesis and infection of the knee but all these  have come up with no significant findings.  DRUG ALLERGIES:  1. LATEX.  2. ERYTHROMYCIN.  3. KEFLEX.  4. HORSE SERUM.  FOOD ALLERGIES:  WALNUTS, PEAS, BEANS, ALL POULTRY, CARROTS, SWEET POTATOES, EGGS, PORK, AND FIN FISH.  CURRENT MEDICATIONS:  1. Zyrtec 10 mg p.o. q.h.s.  2. Atarax 50 mg p.o. q.h.s.  3. Vicodin one or two tablets q.4h.  4. Multivitamin - Centrum Silver one tablet p.o. q.d.  5. Calcium with vitamin D 600 mg p.o. q.d.  6. Iron - Fe-Tinic 150 mg one hour before meals.  7. Stool softener - Dulcolax sodium q.h.s.  8. Albuterol one puff q.a.m. and q.h.s.  9. Ultravate applied as needed. 10. Rogaine applied to affected area twice a day. 11. Flovent one puff q.a.m. and p.m.  MEDICAL HISTORY:  1. Asthma.  2. Chronic eczema.  The patient denies any diabetes, hypertension, thyroid disease, hiatal hernia, peptic ulcers, or heart disease.  PREVIOUS SURGICAL HISTORY:  1. Left ovary removed in 1962.  2. Fallopian tubes tied in 1981.  3. Right total knee arthroplasty in July 2001.  4. Left total knee arthroplasty in August 2001.  5. Lumpectomy right breast in 1990.  Patient states the only complication she has is a possible stress reaction to close proximity of anesthesia with the right and left total knee arthroplasty causing an immune reaction  and loss of hair.  SOCIAL HISTORY:  Patient is a 70 year old white female.  Denies smoking.  She has two or three alcoholic beverages per month.  She is divorced.  She has four grown children.  She lives in a [place] home.  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  PHYSICIANS:  1. Family physician is Dr. [name] at [phone].  2. Dermatologist is Dr. Wilhemina Bonito.  FAMILY MEDICAL HISTORY:  Mother is deceased from congestive heart failure and Alzheimers and diabetes.  Father is alive at 61 years of age.  The patient has one sister alive and in good  health.  REVIEW OF SYSTEMS:  Positive for glasses for reading and long distance.  She does have shortness of breath with heavy exertion related to her asthma.  She does have occasional problems with constipation related to pain medicines. Otherwise, review of systems is negative.  PHYSICAL EXAMINATION:  VITAL SIGNS:  Height 5 feet 4.5 inches tall, weight 193 pounds.  Pulse 72, respirations 12, temperature 98.6, blood pressure 118/82.  GENERAL:  This is a well-developed adult white female.  She ambulates with the use of a cane in her left side with a significant limp.  Patient has the appearance of a very tired, worn-out appearance with a very slow, deliberate gait.  She is able to get on and off the exam table under her own power without difficulty.  HEENT:  Head is normocephalic, atraumatic, nontender over maxillary or frontal sinuses.  Pupils are equal.  They are slightly oval shaped but reactive. Extraocular motor is intact.  Sclerae are not icteric.  Conjunctivae are pink and moist.  External ears without deformities, canals patent, and TMs pearly gray and intact.  Gross hearing is intact.  Nasal septum was midline.  Mucous membranes pink and moist.  No polyps noted.  Oral buccal mucosa was pink and moist without lesions.  Dentition was in good repair.  Uvula was midline and move symmetrically with phonation.  The patient was grossly alopecic with only a slight amount of fine short dark sparse areas along the crown in the back of her head with hair starting to regrow.  She has no other facial hair whatsoever.  NECK:  Supple.  No palpable lymphadenopathy.  Thyroid gland was nontender. Patient had excellent range of motion of her cervical spine without any difficulty or tenderness.  CHEST:  Lung sounds were clear and equal bilaterally.  No wheezes, rales, rhonchi, or rubs noted.  HEART:  Regular rate and rhythm, S1 and S2 is auscultated.  No murmurs, rubs, or gallops  noted.  ABDOMEN:  Round, soft, nontender.  Bowel sounds were present throughout and  were normoactive.  No hepatosplenomegaly notable.  CVA was nontender to percussion.  EXTREMITIES:  Upper extremities were symmetrical size and shape.  She had excellent range of motion of her elbows, shoulders, and wrists without any difficulty or tenderness.  Motor strength was 5/5 in all muscle groups tested.  Lower extremities:  Right and left hip had excellent range of motion with full extension, flexion up to 120 degrees with 20-30 degrees internal-external rotation without any difficulty or tenderness.  Bilateral knees were symmetric size and shape.  They both had well healed midline surgical incisions.  Right knee was slightly warmer to the touch than the left.  There was no sign of erythema or ecchymosis about either knee, no palpable effusions.  Left knee had full extension,  flexion back to 95 degrees.  Right knee had 5 degrees short of full extension and flexion back to 95 degrees.  Patient was significantly tender along the right medial and lateral joint line.  She was tender down the medial aspect along the tibial tuberosity and down the proximal tibia.  She had about 5 degrees laxity with valgus-varus stress and this was quite painful.  Bilateral calves had multiple varicosities.  They were soft, nontender, and she had good range of motion of her ankles without any problems.  PERIPHERAL VASCULATURE:  Carotid pulses were 2+; radial pulses 2+ as well as femoral; dorsalis pedis and posterior tibial pulses were 1+.  The patient had, as previously noted, multiple varicosities but no lower extremity venous stasis changes.  NEUROLOGIC:  Patient was conscious, alert, and appropriate.  Held an easy conversation with the examiner.  Cranial nerves 2-12 were grossly intact. Deep tendon reflexes of the upper and lower extremities were symmetrical right to left.  Patient was grossly intact to light  touch sensation from head to toe.  SKIN:  Patient was dark-skinned appearing as sun tanning, which she has been doing to improve her chronic eczema.  Patient did have some areas of clear white pigmentation changes from the rest of her darker skin about the knee but it was not erythemic or look infected.  Her skin was otherwise dry and flaky in areas.  She did have a small well-healed scar from her last aspiration by Dr. Durward Fortes two weeks previous with no signs of erythema or infection.  IMPRESSION: 1. Failed painful right total knee arthroplasty. 2. Asthma. 3. Chronic eczema. 4. LATEX ALLERGY. 5. MULTIPLE FOOD AND MEDICATION ALLERGIES.  PLAN:  Patient will be admitted to [facility] on April 23, 2001 under the care of Dr. Joni Fears.  The patient will undergo a revision of her right total knee arthroplasty.  The patient will undergo all routine labs and test prior to this surgical procedure. Dictated by:   Evert Kohl, P.A. Attending Physician:  Lacretia Leigh DD:  04/17/01 TD:  04/18/01 Job: [id number]

## 2011-03-09 LAB — BASIC METABOLIC PANEL
BUN: 4 — ABNORMAL LOW
CO2: 27
CO2: 30
Chloride: 104
Chloride: 105
Chloride: 106
Creatinine, Ser: 0.61
GFR calc Af Amer: >60
GFR calc Af Amer: >60
Glucose, Bld: 110 — ABNORMAL HIGH
Potassium: 3.5
Potassium: 3.5
Potassium: 4.4
Sodium: 136
Sodium: 142

## 2011-03-09 LAB — CBC
HCT: 31.7 — ABNORMAL LOW
HCT: 31.9 — ABNORMAL LOW
HCT: 35.2 — ABNORMAL LOW
Hemoglobin: 10.6 — ABNORMAL LOW
Hemoglobin: 11 — ABNORMAL LOW
MCHC: 33.4
MCHC: 34.5
MCV: 89.2
MCV: 90.9
MCV: 91.1
Platelets: 325
Platelets: 359
RBC: 3.57 — ABNORMAL LOW
RBC: 3.86 — ABNORMAL LOW
RDW: 13.6
RDW: 13.8
WBC: 9.1
WBC: 9.8

## 2011-03-09 LAB — ANAEROBIC CULTURE

## 2011-03-09 LAB — DIFFERENTIAL
Basophils Absolute: 0
Basophils Relative: 1
Eosinophils Absolute: 0.6
Lymphocytes Relative: 26
Monocytes Absolute: 0.7

## 2011-03-09 LAB — BODY FLUID CULTURE: Culture: NO GROWTH

## 2011-03-09 LAB — GRAM STAIN

## 2011-03-09 LAB — FUNGUS CULTURE W SMEAR: Fungal Smear: NONE SEEN

## 2011-03-09 LAB — AFB CULTURE WITH SMEAR (NOT AT ARMC): Acid Fast Smear: NONE SEEN

## 2011-03-09 LAB — URINALYSIS, ROUTINE W REFLEX MICROSCOPIC
Glucose, UA: NEGATIVE
Leukocytes, UA: NEGATIVE
Specific Gravity, Urine: 1.035 — ABNORMAL HIGH
Urobilinogen, UA: 0.2

## 2011-03-09 LAB — URINE MICROSCOPIC-ADD ON

## 2011-03-09 LAB — TYPE AND SCREEN
ABO/RH(D): A POS
Antibody Screen: NEGATIVE

## 2011-03-09 LAB — SYNOVIAL CELL COUNT + DIFF, W/ CRYSTALS
Neutrophil, Synovial: 90 — ABNORMAL HIGH
WBC, Synovial: 19845 — ABNORMAL HIGH

## 2011-03-09 LAB — POCT I-STAT EG7
Bicarbonate: 27.1 — ABNORMAL HIGH
Calcium, Ion: 1.17
HCT: 38
Sodium: 143
TCO2: 28
pO2, Ven: 47 — ABNORMAL HIGH

## 2011-03-09 LAB — WOUND CULTURE: Culture: NO GROWTH

## 2011-03-09 LAB — PROTIME-INR
INR: 1
Prothrombin Time: 19.5 — ABNORMAL HIGH

## 2011-03-09 LAB — TISSUE CULTURE

## 2011-10-02 ENCOUNTER — Other Ambulatory Visit: Payer: Self-pay | Admitting: Internal Medicine

## 2011-10-02 DIAGNOSIS — Z1231 Encounter for screening mammogram for malignant neoplasm of breast: Secondary | ICD-10-CM

## 2011-10-15 ENCOUNTER — Other Ambulatory Visit: Payer: Self-pay | Admitting: Adult Health

## 2011-10-15 ENCOUNTER — Other Ambulatory Visit (HOSPITAL_COMMUNITY)
Admission: RE | Admit: 2011-10-15 | Discharge: 2011-10-15 | Disposition: A | Discharge status: Home / Self Care | Payer: Medicare Other | Source: Ambulatory Visit | Attending: Internal Medicine | Admitting: Internal Medicine

## 2011-10-15 DIAGNOSIS — Z124 Encounter for screening for malignant neoplasm of cervix: Secondary | ICD-10-CM | POA: Diagnosis not present

## 2011-10-15 DIAGNOSIS — Z1159 Encounter for screening for other viral diseases: Secondary | ICD-10-CM | POA: Insufficient documentation

## 2011-10-15 DIAGNOSIS — R030 Elevated blood-pressure reading, without diagnosis of hypertension: Secondary | ICD-10-CM | POA: Diagnosis not present

## 2011-10-15 DIAGNOSIS — E039 Hypothyroidism, unspecified: Secondary | ICD-10-CM | POA: Diagnosis not present

## 2011-10-15 DIAGNOSIS — Z0000 Encounter for general adult medical examination without abnormal findings: Secondary | ICD-10-CM | POA: Diagnosis not present

## 2011-10-15 DIAGNOSIS — Z136 Encounter for screening for cardiovascular disorders: Secondary | ICD-10-CM | POA: Diagnosis not present

## 2011-11-07 ENCOUNTER — Ambulatory Visit
Admission: RE | Admit: 2011-11-07 | Discharge: 2011-11-07 | Disposition: A | Discharge status: Home / Self Care | Payer: Medicare Other | Source: Ambulatory Visit | Attending: Internal Medicine | Admitting: Internal Medicine

## 2011-11-07 DIAGNOSIS — Z1231 Encounter for screening mammogram for malignant neoplasm of breast: Secondary | ICD-10-CM | POA: Diagnosis not present

## 2011-11-08 DIAGNOSIS — M899 Disorder of bone, unspecified: Secondary | ICD-10-CM | POA: Diagnosis not present

## 2011-11-08 DIAGNOSIS — M949 Disorder of cartilage, unspecified: Secondary | ICD-10-CM | POA: Diagnosis not present

## 2011-12-05 DIAGNOSIS — R030 Elevated blood-pressure reading, without diagnosis of hypertension: Secondary | ICD-10-CM | POA: Diagnosis not present

## 2011-12-05 DIAGNOSIS — M810 Age-related osteoporosis without current pathological fracture: Secondary | ICD-10-CM | POA: Diagnosis not present

## 2011-12-05 DIAGNOSIS — E785 Hyperlipidemia, unspecified: Secondary | ICD-10-CM | POA: Diagnosis not present

## 2011-12-07 ENCOUNTER — Other Ambulatory Visit: Payer: Self-pay | Admitting: Gastroenterology

## 2011-12-07 DIAGNOSIS — D128 Benign neoplasm of rectum: Secondary | ICD-10-CM | POA: Diagnosis not present

## 2011-12-07 DIAGNOSIS — D129 Benign neoplasm of anus and anal canal: Secondary | ICD-10-CM | POA: Diagnosis not present

## 2011-12-07 DIAGNOSIS — Z86010 Personal history of colonic polyps: Secondary | ICD-10-CM | POA: Diagnosis not present

## 2011-12-07 DIAGNOSIS — D126 Benign neoplasm of colon, unspecified: Secondary | ICD-10-CM | POA: Diagnosis not present

## 2011-12-07 DIAGNOSIS — Z09 Encounter for follow-up examination after completed treatment for conditions other than malignant neoplasm: Secondary | ICD-10-CM | POA: Diagnosis not present

## 2011-12-07 DIAGNOSIS — K5730 Diverticulosis of large intestine without perforation or abscess without bleeding: Secondary | ICD-10-CM | POA: Diagnosis not present

## 2011-12-11 DIAGNOSIS — W261XXA Contact with sword or dagger, initial encounter: Secondary | ICD-10-CM | POA: Diagnosis not present

## 2011-12-11 DIAGNOSIS — Y9289 Other specified places as the place of occurrence of the external cause: Secondary | ICD-10-CM | POA: Diagnosis not present

## 2011-12-11 DIAGNOSIS — Z8619 Personal history of other infectious and parasitic diseases: Secondary | ICD-10-CM | POA: Diagnosis not present

## 2011-12-11 DIAGNOSIS — W260XXA Contact with knife, initial encounter: Secondary | ICD-10-CM | POA: Diagnosis not present

## 2011-12-11 DIAGNOSIS — S61209A Unspecified open wound of unspecified finger without damage to nail, initial encounter: Secondary | ICD-10-CM | POA: Diagnosis not present

## 2011-12-11 DIAGNOSIS — E079 Disorder of thyroid, unspecified: Secondary | ICD-10-CM | POA: Diagnosis not present

## 2012-02-29 DIAGNOSIS — Z23 Encounter for immunization: Secondary | ICD-10-CM | POA: Diagnosis not present

## 2012-05-07 DIAGNOSIS — N390 Urinary tract infection, site not specified: Secondary | ICD-10-CM | POA: Diagnosis not present

## 2012-05-26 DIAGNOSIS — J45909 Unspecified asthma, uncomplicated: Secondary | ICD-10-CM | POA: Diagnosis not present

## 2012-05-26 DIAGNOSIS — M2040 Other hammer toe(s) (acquired), unspecified foot: Secondary | ICD-10-CM | POA: Diagnosis not present

## 2012-05-26 DIAGNOSIS — L659 Nonscarring hair loss, unspecified: Secondary | ICD-10-CM | POA: Diagnosis not present

## 2012-05-26 DIAGNOSIS — R030 Elevated blood-pressure reading, without diagnosis of hypertension: Secondary | ICD-10-CM | POA: Diagnosis not present

## 2012-05-26 DIAGNOSIS — N390 Urinary tract infection, site not specified: Secondary | ICD-10-CM | POA: Diagnosis not present

## 2012-05-28 DIAGNOSIS — L639 Alopecia areata, unspecified: Secondary | ICD-10-CM | POA: Diagnosis not present

## 2012-06-06 DIAGNOSIS — M2010 Hallux valgus (acquired), unspecified foot: Secondary | ICD-10-CM | POA: Diagnosis not present

## 2012-06-06 DIAGNOSIS — M2040 Other hammer toe(s) (acquired), unspecified foot: Secondary | ICD-10-CM | POA: Diagnosis not present

## 2012-11-25 DIAGNOSIS — I10 Essential (primary) hypertension: Secondary | ICD-10-CM | POA: Diagnosis not present

## 2012-11-25 DIAGNOSIS — E039 Hypothyroidism, unspecified: Secondary | ICD-10-CM | POA: Diagnosis not present

## 2012-11-25 DIAGNOSIS — Z1331 Encounter for screening for depression: Secondary | ICD-10-CM | POA: Diagnosis not present

## 2012-11-25 DIAGNOSIS — F325 Major depressive disorder, single episode, in full remission: Secondary | ICD-10-CM | POA: Diagnosis not present

## 2012-11-25 DIAGNOSIS — J45909 Unspecified asthma, uncomplicated: Secondary | ICD-10-CM | POA: Diagnosis not present

## 2012-11-25 DIAGNOSIS — E785 Hyperlipidemia, unspecified: Secondary | ICD-10-CM | POA: Diagnosis not present

## 2013-01-06 DIAGNOSIS — E669 Obesity, unspecified: Secondary | ICD-10-CM | POA: Diagnosis not present

## 2013-01-06 DIAGNOSIS — I10 Essential (primary) hypertension: Secondary | ICD-10-CM | POA: Diagnosis not present

## 2013-02-17 ENCOUNTER — Other Ambulatory Visit: Payer: Self-pay

## 2013-02-17 DIAGNOSIS — Z9889 Other specified postprocedural states: Secondary | ICD-10-CM

## 2013-02-17 DIAGNOSIS — Z1231 Encounter for screening mammogram for malignant neoplasm of breast: Secondary | ICD-10-CM

## 2013-02-26 ENCOUNTER — Ambulatory Visit
Admission: RE | Admit: 2013-02-26 | Discharge: 2013-02-26 | Disposition: A | Discharge status: Home / Self Care | Payer: Medicare Other | Source: Ambulatory Visit

## 2013-02-26 DIAGNOSIS — Z9889 Other specified postprocedural states: Secondary | ICD-10-CM

## 2013-02-26 DIAGNOSIS — Z1231 Encounter for screening mammogram for malignant neoplasm of breast: Secondary | ICD-10-CM | POA: Diagnosis not present

## 2013-03-04 DIAGNOSIS — Z23 Encounter for immunization: Secondary | ICD-10-CM | POA: Diagnosis not present

## 2013-05-22 DIAGNOSIS — I10 Essential (primary) hypertension: Secondary | ICD-10-CM | POA: Diagnosis not present

## 2013-05-22 DIAGNOSIS — F329 Major depressive disorder, single episode, unspecified: Secondary | ICD-10-CM | POA: Diagnosis not present

## 2013-05-22 DIAGNOSIS — E785 Hyperlipidemia, unspecified: Secondary | ICD-10-CM | POA: Diagnosis not present

## 2013-05-22 DIAGNOSIS — N390 Urinary tract infection, site not specified: Secondary | ICD-10-CM | POA: Diagnosis not present

## 2013-05-22 DIAGNOSIS — E039 Hypothyroidism, unspecified: Secondary | ICD-10-CM | POA: Diagnosis not present

## 2013-08-05 DIAGNOSIS — Z961 Presence of intraocular lens: Secondary | ICD-10-CM | POA: Diagnosis not present

## 2013-10-19 DIAGNOSIS — L02419 Cutaneous abscess of limb, unspecified: Secondary | ICD-10-CM | POA: Diagnosis not present

## 2013-10-19 DIAGNOSIS — S61209A Unspecified open wound of unspecified finger without damage to nail, initial encounter: Secondary | ICD-10-CM | POA: Diagnosis not present

## 2013-10-19 DIAGNOSIS — I10 Essential (primary) hypertension: Secondary | ICD-10-CM | POA: Diagnosis not present

## 2013-10-19 DIAGNOSIS — L03119 Cellulitis of unspecified part of limb: Secondary | ICD-10-CM | POA: Diagnosis not present

## 2013-10-21 DIAGNOSIS — S61209A Unspecified open wound of unspecified finger without damage to nail, initial encounter: Secondary | ICD-10-CM | POA: Diagnosis not present

## 2013-11-13 DIAGNOSIS — I10 Essential (primary) hypertension: Secondary | ICD-10-CM | POA: Diagnosis not present

## 2013-11-13 DIAGNOSIS — E785 Hyperlipidemia, unspecified: Secondary | ICD-10-CM | POA: Diagnosis not present

## 2013-11-13 DIAGNOSIS — E039 Hypothyroidism, unspecified: Secondary | ICD-10-CM | POA: Diagnosis not present

## 2013-11-17 DIAGNOSIS — J45909 Unspecified asthma, uncomplicated: Secondary | ICD-10-CM | POA: Diagnosis not present

## 2013-11-17 DIAGNOSIS — E785 Hyperlipidemia, unspecified: Secondary | ICD-10-CM | POA: Diagnosis not present

## 2013-11-17 DIAGNOSIS — Z0000 Encounter for general adult medical examination without abnormal findings: Secondary | ICD-10-CM | POA: Diagnosis not present

## 2013-11-17 DIAGNOSIS — M949 Disorder of cartilage, unspecified: Secondary | ICD-10-CM | POA: Diagnosis not present

## 2013-11-17 DIAGNOSIS — I10 Essential (primary) hypertension: Secondary | ICD-10-CM | POA: Diagnosis not present

## 2013-11-17 DIAGNOSIS — M899 Disorder of bone, unspecified: Secondary | ICD-10-CM | POA: Diagnosis not present

## 2013-11-17 DIAGNOSIS — Z1331 Encounter for screening for depression: Secondary | ICD-10-CM | POA: Diagnosis not present

## 2013-11-25 DIAGNOSIS — M999 Biomechanical lesion, unspecified: Secondary | ICD-10-CM | POA: Diagnosis not present

## 2013-11-25 DIAGNOSIS — M545 Low back pain, unspecified: Secondary | ICD-10-CM | POA: Diagnosis not present

## 2013-11-25 DIAGNOSIS — M5380 Other specified dorsopathies, site unspecified: Secondary | ICD-10-CM | POA: Diagnosis not present

## 2013-11-26 DIAGNOSIS — M545 Low back pain, unspecified: Secondary | ICD-10-CM | POA: Diagnosis not present

## 2013-11-26 DIAGNOSIS — M999 Biomechanical lesion, unspecified: Secondary | ICD-10-CM | POA: Diagnosis not present

## 2013-11-26 DIAGNOSIS — M539 Dorsopathy, unspecified: Secondary | ICD-10-CM | POA: Diagnosis not present

## 2013-11-27 DIAGNOSIS — M545 Low back pain, unspecified: Secondary | ICD-10-CM | POA: Diagnosis not present

## 2013-11-27 DIAGNOSIS — M5380 Other specified dorsopathies, site unspecified: Secondary | ICD-10-CM | POA: Diagnosis not present

## 2013-11-27 DIAGNOSIS — M999 Biomechanical lesion, unspecified: Secondary | ICD-10-CM | POA: Diagnosis not present

## 2013-11-30 DIAGNOSIS — M545 Low back pain, unspecified: Secondary | ICD-10-CM | POA: Diagnosis not present

## 2013-11-30 DIAGNOSIS — M5380 Other specified dorsopathies, site unspecified: Secondary | ICD-10-CM | POA: Diagnosis not present

## 2013-11-30 DIAGNOSIS — M999 Biomechanical lesion, unspecified: Secondary | ICD-10-CM | POA: Diagnosis not present

## 2013-12-02 DIAGNOSIS — M545 Low back pain, unspecified: Secondary | ICD-10-CM | POA: Diagnosis not present

## 2013-12-02 DIAGNOSIS — M999 Biomechanical lesion, unspecified: Secondary | ICD-10-CM | POA: Diagnosis not present

## 2013-12-02 DIAGNOSIS — M5380 Other specified dorsopathies, site unspecified: Secondary | ICD-10-CM | POA: Diagnosis not present

## 2013-12-03 DIAGNOSIS — M999 Biomechanical lesion, unspecified: Secondary | ICD-10-CM | POA: Diagnosis not present

## 2013-12-03 DIAGNOSIS — M5380 Other specified dorsopathies, site unspecified: Secondary | ICD-10-CM | POA: Diagnosis not present

## 2013-12-03 DIAGNOSIS — M545 Low back pain, unspecified: Secondary | ICD-10-CM | POA: Diagnosis not present

## 2013-12-14 DIAGNOSIS — M545 Low back pain, unspecified: Secondary | ICD-10-CM | POA: Diagnosis not present

## 2013-12-14 DIAGNOSIS — M999 Biomechanical lesion, unspecified: Secondary | ICD-10-CM | POA: Diagnosis not present

## 2013-12-14 DIAGNOSIS — M5380 Other specified dorsopathies, site unspecified: Secondary | ICD-10-CM | POA: Diagnosis not present

## 2013-12-16 DIAGNOSIS — M899 Disorder of bone, unspecified: Secondary | ICD-10-CM | POA: Diagnosis not present

## 2013-12-17 DIAGNOSIS — M545 Low back pain, unspecified: Secondary | ICD-10-CM | POA: Diagnosis not present

## 2013-12-17 DIAGNOSIS — M5380 Other specified dorsopathies, site unspecified: Secondary | ICD-10-CM | POA: Diagnosis not present

## 2013-12-17 DIAGNOSIS — M999 Biomechanical lesion, unspecified: Secondary | ICD-10-CM | POA: Diagnosis not present

## 2013-12-21 DIAGNOSIS — M5380 Other specified dorsopathies, site unspecified: Secondary | ICD-10-CM | POA: Diagnosis not present

## 2013-12-21 DIAGNOSIS — M545 Low back pain, unspecified: Secondary | ICD-10-CM | POA: Diagnosis not present

## 2013-12-21 DIAGNOSIS — M999 Biomechanical lesion, unspecified: Secondary | ICD-10-CM | POA: Diagnosis not present

## 2013-12-24 DIAGNOSIS — M999 Biomechanical lesion, unspecified: Secondary | ICD-10-CM | POA: Diagnosis not present

## 2013-12-24 DIAGNOSIS — M545 Low back pain, unspecified: Secondary | ICD-10-CM | POA: Diagnosis not present

## 2013-12-24 DIAGNOSIS — M5380 Other specified dorsopathies, site unspecified: Secondary | ICD-10-CM | POA: Diagnosis not present

## 2013-12-28 DIAGNOSIS — M999 Biomechanical lesion, unspecified: Secondary | ICD-10-CM | POA: Diagnosis not present

## 2013-12-28 DIAGNOSIS — M545 Low back pain, unspecified: Secondary | ICD-10-CM | POA: Diagnosis not present

## 2013-12-28 DIAGNOSIS — M5380 Other specified dorsopathies, site unspecified: Secondary | ICD-10-CM | POA: Diagnosis not present

## 2013-12-31 DIAGNOSIS — M545 Low back pain, unspecified: Secondary | ICD-10-CM | POA: Diagnosis not present

## 2013-12-31 DIAGNOSIS — M999 Biomechanical lesion, unspecified: Secondary | ICD-10-CM | POA: Diagnosis not present

## 2013-12-31 DIAGNOSIS — M5380 Other specified dorsopathies, site unspecified: Secondary | ICD-10-CM | POA: Diagnosis not present

## 2014-01-04 DIAGNOSIS — M545 Low back pain, unspecified: Secondary | ICD-10-CM | POA: Diagnosis not present

## 2014-01-04 DIAGNOSIS — M999 Biomechanical lesion, unspecified: Secondary | ICD-10-CM | POA: Diagnosis not present

## 2014-01-04 DIAGNOSIS — M5380 Other specified dorsopathies, site unspecified: Secondary | ICD-10-CM | POA: Diagnosis not present

## 2014-02-04 DIAGNOSIS — M545 Low back pain, unspecified: Secondary | ICD-10-CM | POA: Diagnosis not present

## 2014-02-04 DIAGNOSIS — M5380 Other specified dorsopathies, site unspecified: Secondary | ICD-10-CM | POA: Diagnosis not present

## 2014-02-04 DIAGNOSIS — H04209 Unspecified epiphora, unspecified lacrimal gland: Secondary | ICD-10-CM | POA: Diagnosis not present

## 2014-02-04 DIAGNOSIS — M999 Biomechanical lesion, unspecified: Secondary | ICD-10-CM | POA: Diagnosis not present

## 2014-02-11 DIAGNOSIS — M545 Low back pain, unspecified: Secondary | ICD-10-CM | POA: Diagnosis not present

## 2014-02-11 DIAGNOSIS — M542 Cervicalgia: Secondary | ICD-10-CM | POA: Diagnosis not present

## 2014-02-11 DIAGNOSIS — M999 Biomechanical lesion, unspecified: Secondary | ICD-10-CM | POA: Diagnosis not present

## 2014-02-11 DIAGNOSIS — M9981 Other biomechanical lesions of cervical region: Secondary | ICD-10-CM | POA: Diagnosis not present

## 2014-02-11 DIAGNOSIS — M5380 Other specified dorsopathies, site unspecified: Secondary | ICD-10-CM | POA: Diagnosis not present

## 2014-02-12 DIAGNOSIS — Z23 Encounter for immunization: Secondary | ICD-10-CM | POA: Diagnosis not present

## 2014-02-18 DIAGNOSIS — M545 Low back pain, unspecified: Secondary | ICD-10-CM | POA: Diagnosis not present

## 2014-02-18 DIAGNOSIS — M999 Biomechanical lesion, unspecified: Secondary | ICD-10-CM | POA: Diagnosis not present

## 2014-02-18 DIAGNOSIS — M542 Cervicalgia: Secondary | ICD-10-CM | POA: Diagnosis not present

## 2014-02-18 DIAGNOSIS — M9981 Other biomechanical lesions of cervical region: Secondary | ICD-10-CM | POA: Diagnosis not present

## 2014-02-18 DIAGNOSIS — M5380 Other specified dorsopathies, site unspecified: Secondary | ICD-10-CM | POA: Diagnosis not present

## 2014-02-25 DIAGNOSIS — M5380 Other specified dorsopathies, site unspecified: Secondary | ICD-10-CM | POA: Diagnosis not present

## 2014-02-25 DIAGNOSIS — M545 Low back pain, unspecified: Secondary | ICD-10-CM | POA: Diagnosis not present

## 2014-02-25 DIAGNOSIS — M999 Biomechanical lesion, unspecified: Secondary | ICD-10-CM | POA: Diagnosis not present

## 2014-04-01 DIAGNOSIS — M9901 Segmental and somatic dysfunction of cervical region: Secondary | ICD-10-CM | POA: Diagnosis not present

## 2014-04-01 DIAGNOSIS — M5406 Panniculitis affecting regions of neck and back, lumbar region: Secondary | ICD-10-CM | POA: Diagnosis not present

## 2014-04-01 DIAGNOSIS — M545 Low back pain: Secondary | ICD-10-CM | POA: Diagnosis not present

## 2014-04-01 DIAGNOSIS — M542 Cervicalgia: Secondary | ICD-10-CM | POA: Diagnosis not present

## 2014-04-01 DIAGNOSIS — M9903 Segmental and somatic dysfunction of lumbar region: Secondary | ICD-10-CM | POA: Diagnosis not present

## 2014-04-14 DIAGNOSIS — L438 Other lichen planus: Secondary | ICD-10-CM | POA: Diagnosis not present

## 2014-04-14 DIAGNOSIS — L638 Other alopecia areata: Secondary | ICD-10-CM | POA: Diagnosis not present

## 2014-04-14 DIAGNOSIS — L821 Other seborrheic keratosis: Secondary | ICD-10-CM | POA: Diagnosis not present

## 2014-04-14 DIAGNOSIS — D2361 Other benign neoplasm of skin of right upper limb, including shoulder: Secondary | ICD-10-CM | POA: Diagnosis not present

## 2014-04-29 DIAGNOSIS — M545 Low back pain: Secondary | ICD-10-CM | POA: Diagnosis not present

## 2014-04-29 DIAGNOSIS — M5406 Panniculitis affecting regions of neck and back, lumbar region: Secondary | ICD-10-CM | POA: Diagnosis not present

## 2014-04-29 DIAGNOSIS — M9903 Segmental and somatic dysfunction of lumbar region: Secondary | ICD-10-CM | POA: Diagnosis not present

## 2014-04-29 DIAGNOSIS — M542 Cervicalgia: Secondary | ICD-10-CM | POA: Diagnosis not present

## 2014-04-29 DIAGNOSIS — M9901 Segmental and somatic dysfunction of cervical region: Secondary | ICD-10-CM | POA: Diagnosis not present

## 2014-05-25 DIAGNOSIS — I10 Essential (primary) hypertension: Secondary | ICD-10-CM | POA: Diagnosis not present

## 2014-05-25 DIAGNOSIS — M8580 Other specified disorders of bone density and structure, unspecified site: Secondary | ICD-10-CM | POA: Diagnosis not present

## 2014-05-25 DIAGNOSIS — E039 Hypothyroidism, unspecified: Secondary | ICD-10-CM | POA: Diagnosis not present

## 2014-05-25 DIAGNOSIS — E780 Pure hypercholesterolemia: Secondary | ICD-10-CM | POA: Diagnosis not present

## 2014-05-25 DIAGNOSIS — J45909 Unspecified asthma, uncomplicated: Secondary | ICD-10-CM | POA: Diagnosis not present

## 2014-06-14 DIAGNOSIS — M9903 Segmental and somatic dysfunction of lumbar region: Secondary | ICD-10-CM | POA: Diagnosis not present

## 2014-06-14 DIAGNOSIS — M545 Low back pain: Secondary | ICD-10-CM | POA: Diagnosis not present

## 2014-06-14 DIAGNOSIS — M5406 Panniculitis affecting regions of neck and back, lumbar region: Secondary | ICD-10-CM | POA: Diagnosis not present

## 2014-06-14 DIAGNOSIS — M9901 Segmental and somatic dysfunction of cervical region: Secondary | ICD-10-CM | POA: Diagnosis not present

## 2014-06-14 DIAGNOSIS — M542 Cervicalgia: Secondary | ICD-10-CM | POA: Diagnosis not present

## 2014-06-29 ENCOUNTER — Other Ambulatory Visit: Payer: Self-pay

## 2014-06-29 DIAGNOSIS — Z1231 Encounter for screening mammogram for malignant neoplasm of breast: Secondary | ICD-10-CM

## 2014-07-06 ENCOUNTER — Ambulatory Visit
Admission: RE | Admit: 2014-07-06 | Discharge: 2014-07-06 | Disposition: A | Discharge status: Home / Self Care | Payer: Medicare Other | Source: Ambulatory Visit

## 2014-07-06 DIAGNOSIS — Z1231 Encounter for screening mammogram for malignant neoplasm of breast: Secondary | ICD-10-CM

## 2014-08-04 DIAGNOSIS — H02102 Unspecified ectropion of right lower eyelid: Secondary | ICD-10-CM | POA: Diagnosis not present

## 2014-08-23 DIAGNOSIS — D485 Neoplasm of uncertain behavior of skin: Secondary | ICD-10-CM | POA: Diagnosis not present

## 2014-08-23 DIAGNOSIS — H11433 Conjunctival hyperemia, bilateral: Secondary | ICD-10-CM | POA: Diagnosis not present

## 2014-08-23 DIAGNOSIS — H0279 Other degenerative disorders of eyelid and periocular area: Secondary | ICD-10-CM | POA: Diagnosis not present

## 2014-08-23 DIAGNOSIS — D481 Neoplasm of uncertain behavior of connective and other soft tissue: Secondary | ICD-10-CM | POA: Diagnosis not present

## 2014-08-23 DIAGNOSIS — H53483 Generalized contraction of visual field, bilateral: Secondary | ICD-10-CM | POA: Diagnosis not present

## 2014-08-23 DIAGNOSIS — J3489 Other specified disorders of nose and nasal sinuses: Secondary | ICD-10-CM | POA: Diagnosis not present

## 2014-08-23 DIAGNOSIS — H04223 Epiphora due to insufficient drainage, bilateral lacrimal glands: Secondary | ICD-10-CM | POA: Diagnosis not present

## 2014-08-24 DIAGNOSIS — R234 Changes in skin texture: Secondary | ICD-10-CM | POA: Diagnosis not present

## 2014-08-24 DIAGNOSIS — H019 Unspecified inflammation of eyelid: Secondary | ICD-10-CM | POA: Diagnosis not present

## 2014-09-06 DIAGNOSIS — H02132 Senile ectropion of right lower eyelid: Secondary | ICD-10-CM | POA: Diagnosis not present

## 2014-09-27 ENCOUNTER — Other Ambulatory Visit: Payer: Self-pay | Admitting: Ophthalmology

## 2014-09-27 DIAGNOSIS — H11821 Conjunctivochalasis, right eye: Secondary | ICD-10-CM | POA: Diagnosis not present

## 2014-09-27 DIAGNOSIS — H02102 Unspecified ectropion of right lower eyelid: Secondary | ICD-10-CM | POA: Diagnosis not present

## 2014-09-27 DIAGNOSIS — H04223 Epiphora due to insufficient drainage, bilateral lacrimal glands: Secondary | ICD-10-CM | POA: Diagnosis not present

## 2014-09-27 DIAGNOSIS — H04551 Acquired stenosis of right nasolacrimal duct: Secondary | ICD-10-CM | POA: Diagnosis not present

## 2014-09-27 DIAGNOSIS — H04301 Unspecified dacryocystitis of right lacrimal passage: Secondary | ICD-10-CM | POA: Diagnosis not present

## 2014-09-27 DIAGNOSIS — J342 Deviated nasal septum: Secondary | ICD-10-CM | POA: Diagnosis not present

## 2014-09-27 DIAGNOSIS — H02132 Senile ectropion of right lower eyelid: Secondary | ICD-10-CM | POA: Diagnosis not present

## 2014-09-27 DIAGNOSIS — H04201 Unspecified epiphora, right lacrimal gland: Secondary | ICD-10-CM | POA: Diagnosis not present

## 2014-09-27 DIAGNOSIS — H04521 Eversion of right lacrimal punctum: Secondary | ICD-10-CM | POA: Diagnosis not present

## 2014-11-23 DIAGNOSIS — E780 Pure hypercholesterolemia: Secondary | ICD-10-CM | POA: Diagnosis not present

## 2014-11-23 DIAGNOSIS — I10 Essential (primary) hypertension: Secondary | ICD-10-CM | POA: Diagnosis not present

## 2014-11-23 DIAGNOSIS — E039 Hypothyroidism, unspecified: Secondary | ICD-10-CM | POA: Diagnosis not present

## 2014-11-25 DIAGNOSIS — E669 Obesity, unspecified: Secondary | ICD-10-CM | POA: Diagnosis not present

## 2014-11-25 DIAGNOSIS — I10 Essential (primary) hypertension: Secondary | ICD-10-CM | POA: Diagnosis not present

## 2014-11-25 DIAGNOSIS — Z6839 Body mass index (BMI) 39.0-39.9, adult: Secondary | ICD-10-CM | POA: Diagnosis not present

## 2014-11-25 DIAGNOSIS — E780 Pure hypercholesterolemia: Secondary | ICD-10-CM | POA: Diagnosis not present

## 2014-11-25 DIAGNOSIS — Z7189 Other specified counseling: Secondary | ICD-10-CM | POA: Diagnosis not present

## 2014-11-29 DIAGNOSIS — H04551 Acquired stenosis of right nasolacrimal duct: Secondary | ICD-10-CM | POA: Diagnosis not present

## 2014-11-29 DIAGNOSIS — H04223 Epiphora due to insufficient drainage, bilateral lacrimal glands: Secondary | ICD-10-CM | POA: Diagnosis not present

## 2015-01-17 DIAGNOSIS — Z7189 Other specified counseling: Secondary | ICD-10-CM | POA: Diagnosis not present

## 2015-01-31 DIAGNOSIS — M47817 Spondylosis without myelopathy or radiculopathy, lumbosacral region: Secondary | ICD-10-CM | POA: Diagnosis not present

## 2015-01-31 DIAGNOSIS — M5416 Radiculopathy, lumbar region: Secondary | ICD-10-CM | POA: Diagnosis not present

## 2015-01-31 DIAGNOSIS — M9903 Segmental and somatic dysfunction of lumbar region: Secondary | ICD-10-CM | POA: Diagnosis not present

## 2015-01-31 DIAGNOSIS — M5417 Radiculopathy, lumbosacral region: Secondary | ICD-10-CM | POA: Diagnosis not present

## 2015-01-31 DIAGNOSIS — M25552 Pain in left hip: Secondary | ICD-10-CM | POA: Diagnosis not present

## 2015-01-31 DIAGNOSIS — M545 Low back pain: Secondary | ICD-10-CM | POA: Diagnosis not present

## 2015-02-01 ENCOUNTER — Other Ambulatory Visit: Payer: Self-pay | Admitting: Orthopaedic Surgery

## 2015-02-01 DIAGNOSIS — M5417 Radiculopathy, lumbosacral region: Secondary | ICD-10-CM | POA: Diagnosis not present

## 2015-02-01 DIAGNOSIS — M545 Low back pain: Secondary | ICD-10-CM

## 2015-02-01 DIAGNOSIS — M9903 Segmental and somatic dysfunction of lumbar region: Secondary | ICD-10-CM | POA: Diagnosis not present

## 2015-02-03 DIAGNOSIS — M545 Low back pain: Secondary | ICD-10-CM | POA: Diagnosis not present

## 2015-02-03 DIAGNOSIS — M5417 Radiculopathy, lumbosacral region: Secondary | ICD-10-CM | POA: Diagnosis not present

## 2015-02-03 DIAGNOSIS — M9903 Segmental and somatic dysfunction of lumbar region: Secondary | ICD-10-CM | POA: Diagnosis not present

## 2015-02-06 ENCOUNTER — Ambulatory Visit
Admission: RE | Admit: 2015-02-06 | Discharge: 2015-02-06 | Disposition: A | Discharge status: Home / Self Care | Payer: Medicare Other | Source: Ambulatory Visit | Attending: Orthopaedic Surgery | Admitting: Orthopaedic Surgery

## 2015-02-06 DIAGNOSIS — M545 Low back pain: Secondary | ICD-10-CM

## 2015-02-07 DIAGNOSIS — M5417 Radiculopathy, lumbosacral region: Secondary | ICD-10-CM | POA: Diagnosis not present

## 2015-02-07 DIAGNOSIS — M9903 Segmental and somatic dysfunction of lumbar region: Secondary | ICD-10-CM | POA: Diagnosis not present

## 2015-02-07 DIAGNOSIS — M545 Low back pain: Secondary | ICD-10-CM | POA: Diagnosis not present

## 2015-02-09 DIAGNOSIS — M5417 Radiculopathy, lumbosacral region: Secondary | ICD-10-CM | POA: Diagnosis not present

## 2015-02-09 DIAGNOSIS — M9903 Segmental and somatic dysfunction of lumbar region: Secondary | ICD-10-CM | POA: Diagnosis not present

## 2015-02-09 DIAGNOSIS — M545 Low back pain: Secondary | ICD-10-CM | POA: Diagnosis not present

## 2015-02-10 DIAGNOSIS — M545 Low back pain: Secondary | ICD-10-CM | POA: Diagnosis not present

## 2015-02-10 DIAGNOSIS — M47817 Spondylosis without myelopathy or radiculopathy, lumbosacral region: Secondary | ICD-10-CM | POA: Diagnosis not present

## 2015-02-11 DIAGNOSIS — M9903 Segmental and somatic dysfunction of lumbar region: Secondary | ICD-10-CM | POA: Diagnosis not present

## 2015-02-11 DIAGNOSIS — M545 Low back pain: Secondary | ICD-10-CM | POA: Diagnosis not present

## 2015-02-11 DIAGNOSIS — M5417 Radiculopathy, lumbosacral region: Secondary | ICD-10-CM | POA: Diagnosis not present

## 2015-02-15 DIAGNOSIS — M545 Low back pain: Secondary | ICD-10-CM | POA: Diagnosis not present

## 2015-02-15 DIAGNOSIS — M5417 Radiculopathy, lumbosacral region: Secondary | ICD-10-CM | POA: Diagnosis not present

## 2015-02-15 DIAGNOSIS — M9903 Segmental and somatic dysfunction of lumbar region: Secondary | ICD-10-CM | POA: Diagnosis not present

## 2015-02-16 DIAGNOSIS — M5136 Other intervertebral disc degeneration, lumbar region: Secondary | ICD-10-CM | POA: Diagnosis not present

## 2015-02-16 DIAGNOSIS — M6281 Muscle weakness (generalized): Secondary | ICD-10-CM | POA: Diagnosis not present

## 2015-02-16 DIAGNOSIS — R2689 Other abnormalities of gait and mobility: Secondary | ICD-10-CM | POA: Diagnosis not present

## 2015-02-17 DIAGNOSIS — Z23 Encounter for immunization: Secondary | ICD-10-CM | POA: Diagnosis not present

## 2015-02-17 DIAGNOSIS — M9903 Segmental and somatic dysfunction of lumbar region: Secondary | ICD-10-CM | POA: Diagnosis not present

## 2015-02-17 DIAGNOSIS — M5417 Radiculopathy, lumbosacral region: Secondary | ICD-10-CM | POA: Diagnosis not present

## 2015-02-17 DIAGNOSIS — M545 Low back pain: Secondary | ICD-10-CM | POA: Diagnosis not present

## 2015-02-18 DIAGNOSIS — M6281 Muscle weakness (generalized): Secondary | ICD-10-CM | POA: Diagnosis not present

## 2015-02-18 DIAGNOSIS — R2689 Other abnormalities of gait and mobility: Secondary | ICD-10-CM | POA: Diagnosis not present

## 2015-02-18 DIAGNOSIS — M5136 Other intervertebral disc degeneration, lumbar region: Secondary | ICD-10-CM | POA: Diagnosis not present

## 2015-02-21 DIAGNOSIS — M9903 Segmental and somatic dysfunction of lumbar region: Secondary | ICD-10-CM | POA: Diagnosis not present

## 2015-02-21 DIAGNOSIS — M545 Low back pain: Secondary | ICD-10-CM | POA: Diagnosis not present

## 2015-02-21 DIAGNOSIS — M5417 Radiculopathy, lumbosacral region: Secondary | ICD-10-CM | POA: Diagnosis not present

## 2015-02-22 DIAGNOSIS — M5136 Other intervertebral disc degeneration, lumbar region: Secondary | ICD-10-CM | POA: Diagnosis not present

## 2015-02-22 DIAGNOSIS — R2689 Other abnormalities of gait and mobility: Secondary | ICD-10-CM | POA: Diagnosis not present

## 2015-02-22 DIAGNOSIS — M6281 Muscle weakness (generalized): Secondary | ICD-10-CM | POA: Diagnosis not present

## 2015-02-23 DIAGNOSIS — M545 Low back pain: Secondary | ICD-10-CM | POA: Diagnosis not present

## 2015-02-23 DIAGNOSIS — M9903 Segmental and somatic dysfunction of lumbar region: Secondary | ICD-10-CM | POA: Diagnosis not present

## 2015-02-23 DIAGNOSIS — M5417 Radiculopathy, lumbosacral region: Secondary | ICD-10-CM | POA: Diagnosis not present

## 2015-02-24 DIAGNOSIS — M5136 Other intervertebral disc degeneration, lumbar region: Secondary | ICD-10-CM | POA: Diagnosis not present

## 2015-02-24 DIAGNOSIS — R2689 Other abnormalities of gait and mobility: Secondary | ICD-10-CM | POA: Diagnosis not present

## 2015-02-24 DIAGNOSIS — M6281 Muscle weakness (generalized): Secondary | ICD-10-CM | POA: Diagnosis not present

## 2015-03-15 DIAGNOSIS — M5416 Radiculopathy, lumbar region: Secondary | ICD-10-CM | POA: Diagnosis not present

## 2015-03-15 DIAGNOSIS — M4316 Spondylolisthesis, lumbar region: Secondary | ICD-10-CM | POA: Diagnosis not present

## 2015-03-16 DIAGNOSIS — M5136 Other intervertebral disc degeneration, lumbar region: Secondary | ICD-10-CM | POA: Diagnosis not present

## 2015-03-16 DIAGNOSIS — M6281 Muscle weakness (generalized): Secondary | ICD-10-CM | POA: Diagnosis not present

## 2015-03-16 DIAGNOSIS — R2689 Other abnormalities of gait and mobility: Secondary | ICD-10-CM | POA: Diagnosis not present

## 2015-03-18 DIAGNOSIS — M6281 Muscle weakness (generalized): Secondary | ICD-10-CM | POA: Diagnosis not present

## 2015-03-18 DIAGNOSIS — R2689 Other abnormalities of gait and mobility: Secondary | ICD-10-CM | POA: Diagnosis not present

## 2015-03-18 DIAGNOSIS — M5136 Other intervertebral disc degeneration, lumbar region: Secondary | ICD-10-CM | POA: Diagnosis not present

## 2015-03-21 DIAGNOSIS — M6281 Muscle weakness (generalized): Secondary | ICD-10-CM | POA: Diagnosis not present

## 2015-03-21 DIAGNOSIS — M5136 Other intervertebral disc degeneration, lumbar region: Secondary | ICD-10-CM | POA: Diagnosis not present

## 2015-03-21 DIAGNOSIS — R2689 Other abnormalities of gait and mobility: Secondary | ICD-10-CM | POA: Diagnosis not present

## 2015-03-21 DIAGNOSIS — M25552 Pain in left hip: Secondary | ICD-10-CM | POA: Diagnosis not present

## 2015-03-24 DIAGNOSIS — M6281 Muscle weakness (generalized): Secondary | ICD-10-CM | POA: Diagnosis not present

## 2015-03-24 DIAGNOSIS — M5136 Other intervertebral disc degeneration, lumbar region: Secondary | ICD-10-CM | POA: Diagnosis not present

## 2015-03-24 DIAGNOSIS — R2689 Other abnormalities of gait and mobility: Secondary | ICD-10-CM | POA: Diagnosis not present

## 2015-03-28 DIAGNOSIS — M5136 Other intervertebral disc degeneration, lumbar region: Secondary | ICD-10-CM | POA: Diagnosis not present

## 2015-03-28 DIAGNOSIS — R2689 Other abnormalities of gait and mobility: Secondary | ICD-10-CM | POA: Diagnosis not present

## 2015-03-28 DIAGNOSIS — M6281 Muscle weakness (generalized): Secondary | ICD-10-CM | POA: Diagnosis not present

## 2015-03-29 DIAGNOSIS — M545 Low back pain: Secondary | ICD-10-CM | POA: Diagnosis not present

## 2015-03-29 DIAGNOSIS — M9903 Segmental and somatic dysfunction of lumbar region: Secondary | ICD-10-CM | POA: Diagnosis not present

## 2015-03-29 DIAGNOSIS — M5417 Radiculopathy, lumbosacral region: Secondary | ICD-10-CM | POA: Diagnosis not present

## 2015-03-30 DIAGNOSIS — R2689 Other abnormalities of gait and mobility: Secondary | ICD-10-CM | POA: Diagnosis not present

## 2015-03-30 DIAGNOSIS — M5136 Other intervertebral disc degeneration, lumbar region: Secondary | ICD-10-CM | POA: Diagnosis not present

## 2015-03-30 DIAGNOSIS — M6281 Muscle weakness (generalized): Secondary | ICD-10-CM | POA: Diagnosis not present

## 2015-03-31 DIAGNOSIS — M25552 Pain in left hip: Secondary | ICD-10-CM | POA: Diagnosis not present

## 2015-04-01 DIAGNOSIS — M5417 Radiculopathy, lumbosacral region: Secondary | ICD-10-CM | POA: Diagnosis not present

## 2015-04-01 DIAGNOSIS — M545 Low back pain: Secondary | ICD-10-CM | POA: Diagnosis not present

## 2015-04-01 DIAGNOSIS — M9903 Segmental and somatic dysfunction of lumbar region: Secondary | ICD-10-CM | POA: Diagnosis not present

## 2015-04-04 DIAGNOSIS — M6281 Muscle weakness (generalized): Secondary | ICD-10-CM | POA: Diagnosis not present

## 2015-04-04 DIAGNOSIS — M545 Low back pain: Secondary | ICD-10-CM | POA: Diagnosis not present

## 2015-04-04 DIAGNOSIS — R2689 Other abnormalities of gait and mobility: Secondary | ICD-10-CM | POA: Diagnosis not present

## 2015-04-04 DIAGNOSIS — M9903 Segmental and somatic dysfunction of lumbar region: Secondary | ICD-10-CM | POA: Diagnosis not present

## 2015-04-04 DIAGNOSIS — M5417 Radiculopathy, lumbosacral region: Secondary | ICD-10-CM | POA: Diagnosis not present

## 2015-04-04 DIAGNOSIS — M5136 Other intervertebral disc degeneration, lumbar region: Secondary | ICD-10-CM | POA: Diagnosis not present

## 2015-04-06 DIAGNOSIS — M5416 Radiculopathy, lumbar region: Secondary | ICD-10-CM | POA: Diagnosis not present

## 2015-04-07 DIAGNOSIS — M6281 Muscle weakness (generalized): Secondary | ICD-10-CM | POA: Diagnosis not present

## 2015-04-07 DIAGNOSIS — R2689 Other abnormalities of gait and mobility: Secondary | ICD-10-CM | POA: Diagnosis not present

## 2015-04-07 DIAGNOSIS — M5136 Other intervertebral disc degeneration, lumbar region: Secondary | ICD-10-CM | POA: Diagnosis not present

## 2015-04-08 DIAGNOSIS — M545 Low back pain: Secondary | ICD-10-CM | POA: Diagnosis not present

## 2015-04-08 DIAGNOSIS — M9903 Segmental and somatic dysfunction of lumbar region: Secondary | ICD-10-CM | POA: Diagnosis not present

## 2015-04-08 DIAGNOSIS — M5417 Radiculopathy, lumbosacral region: Secondary | ICD-10-CM | POA: Diagnosis not present

## 2015-04-11 DIAGNOSIS — M6281 Muscle weakness (generalized): Secondary | ICD-10-CM | POA: Diagnosis not present

## 2015-04-11 DIAGNOSIS — M5136 Other intervertebral disc degeneration, lumbar region: Secondary | ICD-10-CM | POA: Diagnosis not present

## 2015-04-11 DIAGNOSIS — R2689 Other abnormalities of gait and mobility: Secondary | ICD-10-CM | POA: Diagnosis not present

## 2015-05-19 ENCOUNTER — Ambulatory Visit (INDEPENDENT_AMBULATORY_CARE_PROVIDER_SITE_OTHER): Payer: Medicare Other | Admitting: Emergency Medicine

## 2015-05-19 ENCOUNTER — Ambulatory Visit (INDEPENDENT_AMBULATORY_CARE_PROVIDER_SITE_OTHER): Payer: Medicare Other

## 2015-05-19 VITALS — BP 128/78 | HR 70 | Temp 97.4°F | Resp 18 | Ht 65.0 in | Wt 225.2 lb

## 2015-05-19 DIAGNOSIS — S52612A Displaced fracture of left ulna styloid process, initial encounter for closed fracture: Secondary | ICD-10-CM

## 2015-05-19 DIAGNOSIS — M25532 Pain in left wrist: Secondary | ICD-10-CM | POA: Diagnosis not present

## 2015-05-19 NOTE — Progress Notes (Signed)
Subjective:  Patient ID: Linda Rice, female    DOB: [birth year]  Age: 74 y.o. MRN: [id number]  CC: Wrist Injury   HPI [name] presents  apparently the patient just returned from a several week trip to [place] in [place]. Today she tripped and fell landing on her outstretched hand. She has pain in her left wrist she denies any other complaints. She been unable to control pain with ice and over-the-counter medication  History Meredith has a past medical history of Allergy; Arthritis; Asthma; Cataract; Depression; and Thyroid disease.   She has past surgical history that includes Appendectomy; Breast surgery; Cholecystectomy; Eye surgery; Joint replacement; and Tubal ligation.   Her  family history includes Diabetes in her mother; Heart disease in her mother; Hyperlipidemia in her father, mother, and sister; Hypertension in her mother; Stroke in her mother.  She   reports that she has never smoked. She does not have any smokeless tobacco history on file. She reports that she drinks about 0.6 - 3.0 oz of alcohol per week. She reports that she does not use illicit drugs.  No outpatient prescriptions prior to visit.   No facility-administered medications prior to visit.    Social History   Social History  . Marital Status: Divorced    Spouse Name: N/A  . Number of Children: N/A  . Years of Education: N/A   Social History Main Topics  . Smoking status: Never Smoker   . Smokeless tobacco: None  . Alcohol Use: 0.6 - 3.0 oz/week    1-5 Standard drinks or equivalent per week  . Drug Use: No  . Sexual Activity: Not Asked   Other Topics Concern  . None   Social History Narrative  . None     Review of Systems  Constitutional: Negative for fever, chills and appetite change.  HENT: Negative for congestion, ear pain, postnasal drip, sinus pressure and sore throat.   Eyes: Negative for pain and redness.  Respiratory: Negative for cough, shortness of  breath and wheezing.   Cardiovascular: Negative for leg swelling.  Gastrointestinal: Negative for nausea, vomiting, abdominal pain, diarrhea, constipation and blood in stool.  Endocrine: Negative for polyuria.  Genitourinary: Negative for dysuria, urgency, frequency and flank pain.  Musculoskeletal: Negative for gait problem.  Skin: Negative for rash.  Neurological: Negative for weakness and headaches.  Psychiatric/Behavioral: Negative for confusion and decreased concentration. The patient is not nervous/anxious.     Objective:  BP 128/78 mmHg  Pulse 70  Temp(Src) 97.4 F (36.3 C) (Oral)  Resp 18  Ht 5\' 5"  (1.651 m)  Wt 225 lb 3.2 oz (102.15 kg)  BMI 37.48 kg/m2  SpO2 97%  Physical Exam  Constitutional: She is oriented to person, place, and time. She appears well-developed and well-nourished. No distress.  HENT:  Head: Normocephalic and atraumatic.  Right Ear: External ear normal.  Left Ear: External ear normal.  Nose: Nose normal.  Eyes: Conjunctivae and EOM are normal. Pupils are equal, round, and reactive to light. No scleral icterus.  Neck: Normal range of motion. Neck supple. No tracheal deviation present.  Cardiovascular: Normal rate, regular rhythm and normal heart sounds.   Pulmonary/Chest: Effort normal. No respiratory distress. She has no wheezes. She has no rales.  Abdominal: She exhibits no mass. There is no tenderness. There is no rebound and no guarding.  Musculoskeletal: She exhibits no edema.       Left wrist: She exhibits decreased range of motion, tenderness and  swelling.  Lymphadenopathy:    She has no cervical adenopathy.  Neurological: She is alert and oriented to person, place, and time. Coordination normal.  Skin: Skin is warm and dry. No rash noted.  Psychiatric: She has a normal mood and affect. Her behavior is normal.      Assessment & Plan:   Kayja was seen today for wrist injury.  Diagnoses and all orders for this visit:  Left wrist  pain -     DG Wrist Complete Left; Future  Fracture of ulnar styloid, left, closed, initial encounter -     Ambulatory referral to Orthopedic Surgery   I am having Ms. Zane Herald maintain her amLODipine, doxycycline, levothyroxine, Fluticasone-Salmeterol (ADVAIR DISKUS IN), albuterol, and sulfamethoxazole-trimethoprim.  Meds ordered this encounter  Medications  . amLODipine (NORVASC) 5 MG tablet    Sig: Take 5 mg by mouth daily.  Marland Kitchen doxycycline (ADOXA) 100 MG tablet    Sig: Take 100 mg by mouth daily.  Marland Kitchen levothyroxine (SYNTHROID, LEVOTHROID) 88 MCG tablet    Sig: Take 88 mcg by mouth daily before breakfast.  . Fluticasone-Salmeterol (ADVAIR DISKUS IN)    Sig: Inhale into the lungs.  Marland Kitchen albuterol (PROVENTIL HFA;VENTOLIN HFA) 108 (90 BASE) MCG/ACT inhaler    Sig: Inhale into the lungs every 6 (six) hours as needed for wheezing or shortness of breath.  . sulfamethoxazole-trimethoprim (BACTRIM DS,SEPTRA DS) 800-160 MG tablet    Sig: Take 1 tablet by mouth 2 (two) times daily.   She has arrangement with [facility] and was referred there for follow-up care: Gutter splint was applied  Appropriate red flag conditions were discussed with the patient as well as actions that should be taken.  Patient expressed his understanding.  Follow-up: No Follow-up on file.  Roselee Culver, MD   [facility] reading (PRIMARY) by  Dr. Ouida Sills.  Fracture ulnar styloid .

## 2015-05-19 NOTE — Patient Instructions (Signed)
Wrist Fracture °A wrist fracture is a break or crack in one of the bones of your wrist. Your wrist is made up of eight small bones at the palm of your hand (carpal bones) and two long bones that make up your forearm (radius and ulna). °CAUSES °· A direct blow to the wrist. °· Falling on an outstretched hand. °· Trauma, such as a car accident or a fall. °RISK FACTORS °Risk factors for wrist fracture include: °· Participating in contact and high-risk sports, such as skiing, biking, and ice skating. °· Taking steroid medicines. °· Smoking. °· Being female. °· Being Caucasian. °· Drinking more than three alcoholic beverages per day. °· Having low or lowered bone density (osteoporosis or osteopenia). °· Age. Older adults have decreased bone density. °· Women who have had menopause. °· History of previous fractures. °SIGNS AND SYMPTOMS °Symptoms of wrist fractures include tenderness, bruising, and inflammation. Additionally, the wrist may hang in an odd position or appear deformed. °DIAGNOSIS °Diagnosis may include: °· Physical exam. °· X-ray. °TREATMENT °Treatment depends on many factors, including the nature and location of the fracture, your age, and your activity level. Treatment for wrist fracture can be nonsurgical or surgical. °Nonsurgical Treatment °A plaster cast or splint may be applied to your wrist if the bone is in a good position. If the fracture is not in good position, it may be necessary for your health care provider to realign it before applying a splint or cast. Usually, a cast or splint will be worn for several weeks. °Surgical Treatment °Sometimes the position of the bone is so far out of place that surgery is required to apply a device to hold it together as it heals. Depending on the fracture, there are a number of options for holding the bone in place while it heals, such as a cast and metal pins. °HOME CARE INSTRUCTIONS °· Keep your injured wrist elevated and move your fingers as much as  possible. °· Do not put pressure on any part of your cast or splint. It may break. °· Use a plastic bag to protect your cast or splint from water while bathing or showering. Do not lower your cast or splint into water. °· Take medicines only as directed by your health care provider. °· Keep your cast or splint clean and dry. If it becomes wet, damaged, or suddenly feels too tight, contact your health care provider right away. °· Do not use any tobacco products including cigarettes, chewing tobacco, or electronic cigarettes. Tobacco can delay bone healing. If you need help quitting, ask your health care provider. °· Keep all follow-up visits as directed by your health care provider. This is important. °· Ask your health care provider if you should take supplements of calcium and vitamins C and D to promote bone healing. °SEEK MEDICAL CARE IF: °· Your cast or splint is damaged, breaks, or gets wet. °· You have a fever. °· You have chills. °· You have continued severe pain or more swelling than you did before the cast was put on. °SEEK IMMEDIATE MEDICAL CARE IF: °· Your hand or fingernails on the injured arm turn blue or gray, or feel cold or numb. °· You have decreased feeling in the fingers of your injured arm. °MAKE SURE YOU: °· Understand these instructions. °· Will watch your condition. °· Will get help right away if you are not doing well or get worse. °  °This information is not intended to replace advice given to you by your   health care provider. Make sure you discuss any questions you have with your health care provider. °  °Document Released: 03/07/2005 Document Revised: 02/16/2015 Document Reviewed: 06/15/2011 °Elsevier Interactive Patient Education ©2016 [employer] ° °

## 2015-05-19 NOTE — Progress Notes (Signed)
Left ulnar gutter splint applied per Dr. Tonette Bihari verbal order.

## 2015-05-20 DIAGNOSIS — S52612A Displaced fracture of left ulna styloid process, initial encounter for closed fracture: Secondary | ICD-10-CM | POA: Diagnosis not present

## 2015-05-23 DIAGNOSIS — H04551 Acquired stenosis of right nasolacrimal duct: Secondary | ICD-10-CM | POA: Diagnosis not present

## 2015-05-23 DIAGNOSIS — H11433 Conjunctival hyperemia, bilateral: Secondary | ICD-10-CM | POA: Diagnosis not present

## 2015-05-23 DIAGNOSIS — J342 Deviated nasal septum: Secondary | ICD-10-CM | POA: Diagnosis not present

## 2015-05-30 DIAGNOSIS — E039 Hypothyroidism, unspecified: Secondary | ICD-10-CM | POA: Diagnosis not present

## 2015-05-30 DIAGNOSIS — Z5181 Encounter for therapeutic drug level monitoring: Secondary | ICD-10-CM | POA: Diagnosis not present

## 2015-05-30 DIAGNOSIS — R350 Frequency of micturition: Secondary | ICD-10-CM | POA: Diagnosis not present

## 2015-05-30 DIAGNOSIS — M25552 Pain in left hip: Secondary | ICD-10-CM | POA: Diagnosis not present

## 2015-05-30 DIAGNOSIS — I10 Essential (primary) hypertension: Secondary | ICD-10-CM | POA: Diagnosis not present

## 2015-05-30 DIAGNOSIS — R739 Hyperglycemia, unspecified: Secondary | ICD-10-CM | POA: Diagnosis not present

## 2015-06-17 DIAGNOSIS — S52615D Nondisplaced fracture of left ulna styloid process, subsequent encounter for closed fracture with routine healing: Secondary | ICD-10-CM | POA: Diagnosis not present

## 2015-07-28 DIAGNOSIS — M1612 Unilateral primary osteoarthritis, left hip: Secondary | ICD-10-CM | POA: Diagnosis not present

## 2015-08-11 ENCOUNTER — Ambulatory Visit: Payer: Self-pay | Admitting: Orthopedic Surgery

## 2015-08-11 NOTE — Progress Notes (Signed)
Preoperative surgical orders have been place into the [facility] hospital system for [name] on 08/11/2015, 10:17 AM  by Mickel Crow for surgery on 09-05-2015.  Preop Total Hip - Anterior Approach orders including IV Tylenol, and IV Decadron as long as there are no contraindications to the above medications. [name], PA-C

## 2015-08-16 ENCOUNTER — Other Ambulatory Visit: Payer: Self-pay

## 2015-08-16 DIAGNOSIS — Z1231 Encounter for screening mammogram for malignant neoplasm of breast: Secondary | ICD-10-CM

## 2015-08-18 ENCOUNTER — Ambulatory Visit: Payer: Self-pay | Admitting: Orthopedic Surgery

## 2015-08-18 NOTE — H&P (Signed)
Linda Rice DOB: 24-Jul-1940 Divorced / Language: Linda Rice / Race: White Female Date of Admission:  09/05/2015 CC:  Left Hip Pain History of Present Illness The patient is a 75 year old female who comes in for a preoperative History and Physical. The patient is scheduled for a left total hip arthroplasty (anterior) to be performed by Dr. Dione Rice. Aluisio, MD at [facility] on 09-05-2015. The patient is a 75 year old female who presents with a hip problem. The patient was seen for a second opinion.The patient reports left hip problems including pain, grinding, stiffness and diffiulty getting in an out of the car symptoms that have been present for month(s). The symptoms began without any known injury. Symptoms reported include hip pain The patient reports symptoms radiating to the: left groin and left thigh anteriorly. The patient describes the hip problem as sharp.The symptoms are described as severe.The patient feels as if their symptoms are does feel they are worsening. Symptoms are exacerbated by movement and flexing hip. Current treatment includes nonsteroidal anti-inflammatory drugs (ibuprofen, sparingly. She states that Dr. Durward Rice gave her a prescription for tramadol, but it did not help.). Prior to being seen, the patient was previously evaluated by a colleague (Dr. Durward Rice). Previous workup for this problem has included hip x-rays. Previous treatment for this problem has included corticosteroid injection (by Dr. Ernestina Rice in October 2016. She states the her knee began to feel as if it was buckling a few hours after the injection. She states that she was scheduled to go a trip to [place], and had to use a cane to get around. Due to the continued weakness, she had an injection in her back. She had back pain previously for years, but once she started getting pain in groin, she noticed her back was no longer bothering her. She did not really get significant relief after the back  injection.). [name] is at a stage now where her left hip is hurting her at all times. It is definitely limiting what she can and cannot do. She hurts day and night. It got really progressively worse when she went to the far [place] on her vacation in October. Since then, she has had this significant groin pain. She does not have back pain, lower extremity weakness, or paresthesia with this. She hurts day and night. She is not having any right-sided pain. She has previously had knee replacements by Dr. Durward Rice and did well with those. She is now ready to proceed with hip replacement. They have been treated conservatively in the past for the above stated problem and despite conservative measures, they continue to have progressive pain and severe functional limitations and dysfunction. They have failed non-operative management including home exercise, medications. It is felt that they would benefit from undergoing total joint replacement. Risks and benefits of the procedure have been discussed with the patient and they elect to proceed with surgery. There are no active contraindications to surgery such as ongoing infection or rapidly progressive neurological disease.  Problem List/Past Medical Primary osteoarthritis of left hip (M16.12)  Anxiety Disorder  Asthma  Autoimmune disorder  Hypothyroidism  Oophorectomy  left Osteoarthritis  Hypertension  Hypercholesterolemia  Urinary Incontinence  Urinary Tract Infection  Hypothyroidism  Osteopenia  Alopecia Totalus  Osteomyelitis  Right Knee Measles  Rubella  Eczema  Allergies Erythromycin *MACROLIDES*  Hives. Latex  Allergies Reconciled  Levaquin *Fluoroquinolones**  Hives. 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  Medication History AmLODIPine Besylate (5MG  Tablet, Oral) Active. Advair Diskus (100-50MCG/DOSE Aero Pow Br Act, Inhalation) Active. Clobetasol Propionate (0.05% Solution, External) Active. Levothyroxine Sodium (88MCG Tablet, Oral) Active. ProAir HFA (108 (90 Base)MCG/ACT Aerosol Soln, Inhalation) Active. Doxycycline Hyclate (100MG  Capsule, Oral) Active. Sulfamethoxazole-Trimethoprim (400-80MG  Tablet, Oral) Active.  Past Surgical History Appendectomy  Breast Mass; Local Excision  right Carpal Tunnel Repair  right Cataract Surgery  bilateral Dilation and Curettage of Uterus  Gallbladder Surgery  laporoscopic Tonsillectomy  Total Knee Replacement  bilateral Tubal Ligation  Resection of Right Total Knee Replacement  Reimplantation of Right Total Knee Replacement    Review of Systems General Not Present- Chills, Fatigue, Fever, Memory Loss, Night Sweats, Weight Gain and Weight Loss. Skin Not Present- Eczema, Hives, Itching, Lesions and Rash. HEENT Not Present- Dentures, Double Vision, Headache, Hearing Loss, Tinnitus and Visual Loss. Respiratory Present- Shortness of breath with  exertion (mild). Not Present- Allergies, Chronic Cough, Coughing up blood and Shortness of breath at rest. Cardiovascular Not Present- Chest Pain, Difficulty Breathing Lying Down, Murmur, Palpitations, Racing/skipping heartbeats and Swelling. Gastrointestinal Not Present- Abdominal Pain, Bloody Stool, Constipation, Diarrhea, Difficulty Swallowing, Heartburn, Jaundice, Loss of appetitie, Nausea and Vomiting. Female Genitourinary Present- Incontinence (mild). Not Present- Blood in Urine, Discharge, Flank Pain, Painful Urination, Urgency, Urinary frequency, Urinary Retention, Urinating at Night and Weak urinary stream. Musculoskeletal Present- Back Pain and Joint Pain. Not Present- Joint Swelling, Morning Stiffness, Muscle Pain, Muscle Weakness and Spasms. Neurological Not Present- Blackout spells, Difficulty with balance, Dizziness, Paralysis, Tremor and Weakness. Psychiatric Not Present- Insomnia.  Vitals  Weight: 220 lb Height: 64in Body Surface Area: 2.04 m Body Mass Index: 37.76 kg/m  Pulse: 64 (Regular)  Resp.: 16 (Unlabored)  BP: 128/78 (Sitting, Right Arm, Standard)  Physical Exam The physical exam findings are as follows: Note:Patient a 75 year old female. Patient is accompanied today by Linda Rice.  General Mental Status -Alert, cooperative and good historian, teary-eyed at time of exam. General Appearance-pleasant, In mild distress secondary to moderate hip pain Orientation-Oriented X3. Build & Nutrition-Well nourished and Well developed.  Head and Neck Head-normocephalic, atraumatic . Neck Global Assessment - supple, no bruit auscultated on the right, no bruit auscultated on the left.  Eye Vision-Wears corrective lenses(readers). Pupil - Bilateral-Regular and Round. Motion - Bilateral-EOMI.  ENMT Note: upper denture plate   Chest and Lung Exam Auscultation Breath sounds - clear at anterior chest wall and clear at posterior chest wall.  Adventitious sounds - No Adventitious sounds.  Cardiovascular Auscultation Rhythm - Regular rate and rhythm(with an occasional ectopic beat). Heart Sounds - S1 WNL and S2 WNL. Murmurs &  Other Heart Sounds - Auscultation of the heart reveals - No Murmurs.  Abdomen Palpation/Percussion Tenderness - Abdomen is non-tender to palpation. Rigidity (guarding) - Abdomen is soft. Auscultation Auscultation of the abdomen reveals - Bowel sounds normal.  Female Genitourinary Note: Not done, not pertinent to present illness   Musculoskeletal Note: A well-developed female in no distress. Her right hip can be flexed to 120, rotated in 30, out 40, abducted to 40 without discomfort. Left hip flexion to about 90, minimal internal rotation, about 10 to 20 of external rotation, 20 of abduction. She has a lot of pain with any attempted motion of the hip.  RADIOGRAPHS AP pelvis, lateral left hip bone-on-bone arthritis, complete loss of joint space with no erosion of the femoral head or acetabulum. Right hip looks normal. No bony masses noted.  Assessment & Plan Primary osteoarthritis of left hip (M16.12)  Note:Surgical Plans: Left Total Hip Replacement - Anterior Approach  Disposition: Home  PCP: Dr. [name]  IV TXA  Anesthesia Issues: None  Signed electronically by Ok Edwards, III PA-C

## 2015-08-24 DIAGNOSIS — H2513 Age-related nuclear cataract, bilateral: Secondary | ICD-10-CM | POA: Diagnosis not present

## 2015-08-25 NOTE — Patient Instructions (Addendum)
YOUR PROCEDURE IS SCHEDULED ON :  09/05/15  REPORT TO [facility] MAIN ENTRANCE FOLLOW SIGNS TO EAST ELEVATOR - GO TO 3rd FLOOR CHECK IN AT 3 EAST NURSES STATION (SHORT STAY) AT:  12:45 PM  CALL THIS NUMBER IF YOU HAVE PROBLEMS THE MORNING OF SURGERY [phone]  REMEMBER:ONLY 1 PER PERSON MAY GO TO SHORT STAY WITH YOU TO GET READY THE MORNING OF YOUR SURGERY  DO NOT EAT FOOD  AFTER MIDNIGHT  MAY HAVE CLEAR LIQUIDS UNTIL 9:30 AM  TAKE THESE MEDICINES THE MORNING OF SURGERY: LEVOTHYROXINE / DOXYCYCLINE / AMLODIPINE / USE INHALERS / MAY TAKE TRAMADOL IF NEEDED  CLEAR LIQUID DIET  Foods Allowed                                                                     Foods Excluded  Coffee and tea, regular and decaf                             liquids that you cannot  Plain Jell-O in any flavor                                             see through such as: Fruit ices (not with fruit pulp)                                     milk, soups, orange juice  Iced Popsicles                                                           All solid food Carbonated beverages, regular and diet                                    Cranberry, grape and apple juices Sports drinks like Gatorade Lightly seasoned clear broth or consume(fat free) Sugar, honey syrup  _____________________________________________________________________    YOU MAY NOT HAVE ANY METAL ON YOUR BODY INCLUDING HAIR PINS AND PIERCING'S. DO NOT WEAR JEWELRY, MAKEUP, LOTIONS, POWDERS OR PERFUMES. DO NOT WEAR NAIL POLISH. DO NOT SHAVE 48 HRS PRIOR TO SURGERY. MEN MAY SHAVE FACE AND NECK.  DO NOT [name] IS NOT RESPONSIBLE FOR VALUABLES.  CONTACTS, DENTURES OR PARTIALS MAY NOT BE WORN TO SURGERY. LEAVE SUITCASE IN CAR. CAN BE BROUGHT TO ROOM AFTER SURGERY.  PATIENTS DISCHARGED THE DAY OF SURGERY WILL NOT BE ALLOWED TO DRIVE HOME.  PLEASE READ OVER THE FOLLOWING INSTRUCTION  SHEETS _________________________________________________________________________________  [place]  Before surgery, you can play an important role.  Because skin is not sterile, your skin needs to be as free of germs as possible.  You can reduce the number of germs on your skin by washing with CHG (chlorahexidine gluconate) soap before surgery.  CHG is an antiseptic cleaner which kills germs and bonds with the skin to continue killing germs even after washing. Please DO NOT use if you have an allergy to CHG or antibacterial soaps.  If your skin becomes reddened/irritated stop using the CHG and inform your nurse when you arrive at Short Stay. Do not shave (including legs and underarms) for at least 48 hours prior to the first CHG shower.  You may shave your face. Please follow these instructions carefully:   1.  Shower with CHG Soap the night before surgery and the  morning of Surgery.   2.  If you choose to wash your hair, wash your hair first as usual with your  normal  Shampoo.   3.  After you shampoo, rinse your hair and body thoroughly to remove the  shampoo.                                         4.  Use CHG as you would any other liquid soap.  You can apply chg directly  to the skin and wash . Gently wash with scrungie or clean wascloth    5.  Apply the CHG Soap to your body ONLY FROM THE NECK DOWN.   Do not use on open                           Wound or open sores. Avoid contact with eyes, ears mouth and genitals (private parts).                        Genitals (private parts) with your normal soap.              6.  Wash thoroughly, paying special attention to the area where your surgery  will be performed.   7.  Thoroughly rinse your body with warm water from the neck down.   8.  DO NOT shower/wash with your normal soap after using and rinsing off  the CHG Soap .                9.  Pat yourself dry with a clean  towel.             10.  Wear clean night clothes to bed after shower             11.  Place clean sheets on your bed the night of your first shower and do not  sleep with pets.  Day of Surgery : Do not apply any lotions/deodorants the morning of surgery.  Please wear clean clothes to the hospital/surgery center.  FAILURE TO FOLLOW THESE INSTRUCTIONS MAY RESULT IN THE CANCELLATION OF YOUR SURGERY    PATIENT SIGNATURE_________________________________  ______________________________________________________________________     Linda Rice  An incentive spirometer is a tool that can help keep your lungs clear and active. This tool measures how well you are filling your lungs with each breath. Taking long deep breaths may help reverse or decrease the chance of developing breathing (pulmonary)  problems (especially infection) following:  A long period of time when you are unable to move or be active. BEFORE THE PROCEDURE   If the spirometer includes an indicator to show your best effort, your nurse or respiratory therapist will set it to a desired goal.  If possible, sit up straight or lean slightly forward. Try not to slouch.  Hold the incentive spirometer in an upright position. INSTRUCTIONS FOR USE   Sit on the edge of your bed if possible, or sit up as far as you can in bed or on a chair.  Hold the incentive spirometer in an upright position.  Breathe out normally.  Place the mouthpiece in your mouth and seal your lips tightly around it.  Breathe in slowly and as deeply as possible, raising the piston or the ball toward the top of the column.  Hold your breath for 3-5 seconds or for as long as possible. Allow the piston or ball to fall to the bottom of the column.  Remove the mouthpiece from your mouth and breathe out normally.  Rest for a few seconds and repeat Steps 1 through 7 at least 10 times every 1-2 hours when you are awake. Take your time and take a few  normal breaths between deep breaths.  The spirometer may include an indicator to show your best effort. Use the indicator as a goal to work toward during each repetition.  After each set of 10 deep breaths, practice coughing to be sure your lungs are clear. If you have an incision (the cut made at the time of surgery), support your incision when coughing by placing a pillow or rolled up towels firmly against it. Once you are able to get out of bed, walk around indoors and cough well. You may stop using the incentive spirometer when instructed by your caregiver.  RISKS AND COMPLICATIONS  Take your time so you do not get dizzy or light-headed.  If you are in pain, you may need to take or ask for pain medication before doing incentive spirometry. It is harder to take a deep breath if you are having pain. AFTER USE  Rest and breathe slowly and easily.  It can be helpful to keep track of a log of your progress. Your caregiver can provide you with a simple table to help with this. If you are using the spirometer at home, follow these instructions: [place] IF:   You are having difficultly using the spirometer.  You have trouble using the spirometer as often as instructed.  Your pain medication is not giving enough relief while using the spirometer.  You develop fever of 100.5 F (38.1 C) or higher. SEEK IMMEDIATE MEDICAL CARE IF:   You cough up bloody sputum that had not been present before.  You develop fever of 102 F (38.9 C) or greater.  You develop worsening pain at or near the incision site. MAKE SURE YOU:   Understand these instructions.  Will watch your condition.  Will get help right away if you are not doing well or get worse. Document Released: 10/08/2006 Document Revised: 08/20/2011 Document Reviewed: 12/09/2006 Exit[employer] Patient Information 2014 [employer], [place].   ________________________________________________________________________  WHAT IS A BLOOD  TRANSFUSION? Blood Transfusion Information  A transfusion is the replacement of blood or some of its parts. Blood is made up of multiple cells which provide different functions.  Red blood cells carry oxygen and are used for blood loss replacement.  White blood cells fight against infection.  Platelets control bleeding.  Plasma helps clot blood.  Other blood products are available for specialized needs, such as hemophilia or other clotting disorders. BEFORE THE TRANSFUSION  Who gives blood for transfusions?   Healthy volunteers who are fully evaluated to make sure their blood is safe. This is blood bank blood. Transfusion therapy is the safest it has ever been in the practice of medicine. Before blood is taken from a donor, a complete history is taken to make sure that person has no history of diseases nor engages in risky social behavior (examples are intravenous drug use or sexual activity with multiple partners). The donor's travel history is screened to minimize risk of transmitting infections, such as malaria. The donated blood is tested for signs of infectious diseases, such as HIV and hepatitis. The blood is then tested to be sure it is compatible with you in order to minimize the chance of a transfusion reaction. If you or a relative donates blood, this is often done in anticipation of surgery and is not appropriate for emergency situations. It takes many days to process the donated blood. RISKS AND COMPLICATIONS Although transfusion therapy is very safe and saves many lives, the main dangers of transfusion include:   Getting an infectious disease.  Developing a transfusion reaction. This is an allergic reaction to something in the blood you were given. Every precaution is taken to prevent this. The decision to have a blood transfusion has been considered carefully by your caregiver before blood is given. Blood is not given unless the benefits outweigh the risks. AFTER THE  TRANSFUSION  Right after receiving a blood transfusion, you will usually feel much better and more energetic. This is especially true if your red blood cells have gotten low (anemic). The transfusion raises the level of the red blood cells which carry oxygen, and this usually causes an energy increase.  The nurse administering the transfusion will monitor you carefully for complications. HOME CARE INSTRUCTIONS  No special instructions are needed after a transfusion. You may find your energy is better. Speak with your caregiver about any limitations on activity for underlying diseases you may have. SEEK MEDICAL CARE IF:   Your condition is not improving after your transfusion.  You develop redness or irritation at the intravenous (IV) site. SEEK IMMEDIATE MEDICAL CARE IF:  Any of the following symptoms occur over the next 12 hours:  Shaking chills.  You have a temperature by mouth above 102 F (38.9 C), not controlled by medicine.  Chest, back, or muscle pain.  People around you feel you are not acting correctly or are confused.  Shortness of breath or difficulty breathing.  Dizziness and fainting.  You get a rash or develop hives.  You have a decrease in urine output.  Your urine turns a dark color or changes to pink, red, or brown. Any of the following symptoms occur over the next 10 days:  You have a temperature by mouth above 102 F (38.9 C), not controlled by medicine.  Shortness of breath.  Weakness after normal activity.  The white part of the eye turns yellow (jaundice).  You have a decrease in the amount of urine or are urinating less often.  Your urine turns a dark color or changes to pink, red, or brown. Document Released: 05/25/2000 Document Revised: 08/20/2011 Document Reviewed: 01/12/2008 [facility] Patient Information 2014 [place], [place].  _______________________________________________________________________

## 2015-08-26 ENCOUNTER — Encounter (HOSPITAL_COMMUNITY)
Admission: RE | Admit: 2015-08-26 | Discharge: 2015-08-26 | Disposition: A | Discharge status: Home / Self Care | Payer: Medicare Other | Source: Ambulatory Visit | Attending: Orthopedic Surgery | Admitting: Orthopedic Surgery

## 2015-08-26 ENCOUNTER — Encounter (HOSPITAL_COMMUNITY): Payer: Self-pay

## 2015-08-26 DIAGNOSIS — Z01818 Encounter for other preprocedural examination: Secondary | ICD-10-CM | POA: Diagnosis not present

## 2015-08-26 DIAGNOSIS — R9431 Abnormal electrocardiogram [ECG] [EKG]: Secondary | ICD-10-CM | POA: Diagnosis not present

## 2015-08-26 DIAGNOSIS — I498 Other specified cardiac arrhythmias: Secondary | ICD-10-CM | POA: Insufficient documentation

## 2015-08-26 DIAGNOSIS — I10 Essential (primary) hypertension: Secondary | ICD-10-CM | POA: Insufficient documentation

## 2015-08-26 DIAGNOSIS — M1612 Unilateral primary osteoarthritis, left hip: Secondary | ICD-10-CM | POA: Insufficient documentation

## 2015-08-26 DIAGNOSIS — Z01812 Encounter for preprocedural laboratory examination: Secondary | ICD-10-CM | POA: Diagnosis not present

## 2015-08-26 DIAGNOSIS — Z0183 Encounter for blood typing: Secondary | ICD-10-CM | POA: Diagnosis not present

## 2015-08-26 HISTORY — DX: Essential (primary) hypertension: I10

## 2015-08-26 HISTORY — DX: Personal history of other medical treatment: Z92.89

## 2015-08-26 HISTORY — DX: Adverse effect of unspecified anesthetic, initial encounter: T41.45XA

## 2015-08-26 HISTORY — DX: Other complications of anesthesia, initial encounter: T88.59XA

## 2015-08-26 HISTORY — DX: Other specified disorders of bone density and structure, unspecified site: M85.80

## 2015-08-26 HISTORY — DX: Dermatitis, unspecified: L30.9

## 2015-08-26 HISTORY — DX: Nonscarring hair loss, unspecified: L65.9

## 2015-08-26 HISTORY — DX: Unspecified urinary incontinence: R32

## 2015-08-26 LAB — URINALYSIS, ROUTINE W REFLEX MICROSCOPIC
GLUCOSE, UA: NEGATIVE mg/dL
Hgb urine dipstick: NEGATIVE
KETONES UR: 15 mg/dL — AB
NITRITE: NEGATIVE
PH: 6.5 (ref 5.0–8.0)
PROTEIN: NEGATIVE mg/dL
Specific Gravity, Urine: 1.02 (ref 1.005–1.030)

## 2015-08-26 LAB — COMPREHENSIVE METABOLIC PANEL
ALK PHOS: 74 U/L (ref 38–126)
ALT: 16 U/L (ref 14–54)
AST: 42 U/L — ABNORMAL HIGH (ref 15–41)
Albumin: 4.1 g/dL (ref 3.5–5.0)
Anion gap: 12 (ref 5–15)
BILIRUBIN TOTAL: 0.5 mg/dL (ref 0.3–1.2)
BUN: 14 mg/dL (ref 6–20)
CALCIUM: 9.5 mg/dL (ref 8.9–10.3)
CO2: 28 mmol/L (ref 22–32)
CREATININE: 0.79 mg/dL (ref 0.44–1.00)
Chloride: 103 mmol/L (ref 101–111)
Glucose, Bld: 102 mg/dL — ABNORMAL HIGH (ref 65–99)
Potassium: 4 mmol/L (ref 3.5–5.1)
Sodium: 143 mmol/L (ref 135–145)
Total Protein: 7.1 g/dL (ref 6.5–8.1)

## 2015-08-26 LAB — CBC
HEMATOCRIT: 45.8 % (ref 36.0–46.0)
HEMOGLOBIN: 14.7 g/dL (ref 12.0–15.0)
MCH: 29.5 pg (ref 26.0–34.0)
MCHC: 32.1 g/dL (ref 30.0–36.0)
MCV: 91.8 fL (ref 78.0–100.0)
PLATELETS: 313 10*3/uL (ref 150–400)
RBC: 4.99 MIL/uL (ref 3.87–5.11)
RDW: 13.5 % (ref 11.5–15.5)
WBC: 7.6 10*3/uL (ref 4.0–10.5)

## 2015-08-26 LAB — SURGICAL PCR SCREEN
MRSA, PCR: NEGATIVE
STAPHYLOCOCCUS AUREUS: NEGATIVE

## 2015-08-26 LAB — APTT: aPTT: 30 seconds (ref 24–37)

## 2015-08-26 LAB — URINE MICROSCOPIC-ADD ON

## 2015-08-26 LAB — PROTIME-INR
INR: 0.97 (ref 0.00–1.49)
PROTHROMBIN TIME: 13.1 s (ref 11.6–15.2)

## 2015-08-26 NOTE — Progress Notes (Signed)
UA faxed to Dr. Aluisio 

## 2015-08-29 NOTE — Progress Notes (Signed)
EKG reviewed by Dr.Ewell - no action needed

## 2015-09-05 ENCOUNTER — Encounter (HOSPITAL_COMMUNITY): Payer: Self-pay | Admitting: *Deleted

## 2015-09-05 ENCOUNTER — Inpatient Hospital Stay (HOSPITAL_COMMUNITY): Payer: Medicare Other | Admitting: Anesthesiology

## 2015-09-05 ENCOUNTER — Inpatient Hospital Stay (HOSPITAL_COMMUNITY)
Admission: RE | Admit: 2015-09-05 | Discharge: 2015-09-07 | DRG: 470 | Disposition: A | Discharge status: Home Health | Payer: Medicare Other | Source: Ambulatory Visit | Attending: Orthopedic Surgery | Admitting: Orthopedic Surgery

## 2015-09-05 ENCOUNTER — Inpatient Hospital Stay (HOSPITAL_COMMUNITY): Payer: Medicare Other

## 2015-09-05 ENCOUNTER — Encounter (HOSPITAL_COMMUNITY)
Admission: RE | Disposition: A | Discharge status: Home Health | Payer: Self-pay | Source: Ambulatory Visit | Attending: Orthopedic Surgery

## 2015-09-05 DIAGNOSIS — M25552 Pain in left hip: Secondary | ICD-10-CM | POA: Diagnosis not present

## 2015-09-05 DIAGNOSIS — Z96653 Presence of artificial knee joint, bilateral: Secondary | ICD-10-CM | POA: Diagnosis present

## 2015-09-05 DIAGNOSIS — E039 Hypothyroidism, unspecified: Secondary | ICD-10-CM | POA: Diagnosis present

## 2015-09-05 DIAGNOSIS — F419 Anxiety disorder, unspecified: Secondary | ICD-10-CM | POA: Diagnosis present

## 2015-09-05 DIAGNOSIS — I10 Essential (primary) hypertension: Secondary | ICD-10-CM | POA: Diagnosis present

## 2015-09-05 DIAGNOSIS — Z471 Aftercare following joint replacement surgery: Secondary | ICD-10-CM | POA: Diagnosis not present

## 2015-09-05 DIAGNOSIS — Z87891 Personal history of nicotine dependence: Secondary | ICD-10-CM

## 2015-09-05 DIAGNOSIS — M169 Osteoarthritis of hip, unspecified: Secondary | ICD-10-CM | POA: Diagnosis present

## 2015-09-05 DIAGNOSIS — Z96641 Presence of right artificial hip joint: Secondary | ICD-10-CM | POA: Diagnosis not present

## 2015-09-05 DIAGNOSIS — M1612 Unilateral primary osteoarthritis, left hip: Principal | ICD-10-CM | POA: Diagnosis present

## 2015-09-05 DIAGNOSIS — Z01812 Encounter for preprocedural laboratory examination: Secondary | ICD-10-CM

## 2015-09-05 DIAGNOSIS — Z96649 Presence of unspecified artificial hip joint: Secondary | ICD-10-CM

## 2015-09-05 HISTORY — PX: TOTAL HIP ARTHROPLASTY: SHX124

## 2015-09-05 LAB — TYPE AND SCREEN
ABO/RH(D): A POS
Antibody Screen: NEGATIVE

## 2015-09-05 SURGERY — ARTHROPLASTY, HIP, TOTAL, ANTERIOR APPROACH
Anesthesia: Spinal | Site: Hip | Laterality: Left

## 2015-09-05 MED ORDER — PROPOFOL 10 MG/ML IV BOLUS
INTRAVENOUS | Status: AC
  Filled 2015-09-05: qty 40

## 2015-09-05 MED ORDER — GLYCOPYRROLATE 0.2 MG/ML IJ SOLN
INTRAMUSCULAR | Status: DC | PRN
  Administered 2015-09-05: 0.2 mg via INTRAVENOUS

## 2015-09-05 MED ORDER — METHOCARBAMOL 1000 MG/10ML IJ SOLN
500.0000 mg | Freq: Four times a day (QID) | INTRAVENOUS | Status: DC | PRN
  Filled 2015-09-05: qty 5

## 2015-09-05 MED ORDER — ACETAMINOPHEN 10 MG/ML IV SOLN
INTRAVENOUS | Status: AC
  Filled 2015-09-05: qty 100

## 2015-09-05 MED ORDER — SUGAMMADEX SODIUM 200 MG/2ML IV SOLN
INTRAVENOUS | Status: AC
  Filled 2015-09-05: qty 2

## 2015-09-05 MED ORDER — SUCCINYLCHOLINE CHLORIDE 20 MG/ML IJ SOLN
INTRAMUSCULAR | Status: DC | PRN
  Administered 2015-09-05: 100 mg via INTRAVENOUS

## 2015-09-05 MED ORDER — DOXYCYCLINE HYCLATE 100 MG PO TABS
100.0000 mg | ORAL_TABLET | Freq: Two times a day (BID) | ORAL | Status: DC
  Administered 2015-09-06 – 2015-09-07 (×3): 100 mg via ORAL
  Filled 2015-09-05 (×4): qty 1

## 2015-09-05 MED ORDER — TRAMADOL HCL 50 MG PO TABS: 50.0000 mg | ORAL_TABLET | Freq: Four times a day (QID) | ORAL | Status: DC | PRN

## 2015-09-05 MED ORDER — HYDROCODONE-ACETAMINOPHEN 7.5-325 MG PO TABS: 1.0000 | ORAL_TABLET | Freq: Once | ORAL | Status: DC | PRN

## 2015-09-05 MED ORDER — ACETAMINOPHEN 10 MG/ML IV SOLN
1000.0000 mg | Freq: Once | INTRAVENOUS | Status: AC
  Administered 2015-09-05: 1000 mg via INTRAVENOUS
  Filled 2015-09-05: qty 100

## 2015-09-05 MED ORDER — MENTHOL 3 MG MT LOZG: 1.0000 | LOZENGE | OROMUCOSAL | Status: DC | PRN

## 2015-09-05 MED ORDER — SODIUM CHLORIDE 0.9 % IV SOLN: INTRAVENOUS | Status: DC

## 2015-09-05 MED ORDER — MORPHINE SULFATE (PF) 2 MG/ML IV SOLN
1.0000 mg | INTRAVENOUS | Status: DC | PRN
  Administered 2015-09-06: 1 mg via INTRAVENOUS
  Filled 2015-09-05: qty 1

## 2015-09-05 MED ORDER — EPHEDRINE SULFATE 50 MG/ML IJ SOLN
INTRAMUSCULAR | Status: DC | PRN
  Administered 2015-09-05: 10 mg via INTRAVENOUS

## 2015-09-05 MED ORDER — METHOCARBAMOL 500 MG PO TABS
500.0000 mg | ORAL_TABLET | Freq: Four times a day (QID) | ORAL | Status: DC | PRN
  Administered 2015-09-06 – 2015-09-07 (×4): 500 mg via ORAL
  Filled 2015-09-05 (×6): qty 1

## 2015-09-05 MED ORDER — MIDAZOLAM HCL 5 MG/5ML IJ SOLN
INTRAMUSCULAR | Status: DC | PRN
  Administered 2015-09-05 (×2): 1 mg via INTRAVENOUS

## 2015-09-05 MED ORDER — BISACODYL 10 MG RE SUPP: 10.0000 mg | Freq: Every day | RECTAL | Status: DC | PRN

## 2015-09-05 MED ORDER — LACTATED RINGERS IV SOLN: INTRAVENOUS | Status: DC

## 2015-09-05 MED ORDER — RIVAROXABAN 10 MG PO TABS
10.0000 mg | ORAL_TABLET | Freq: Every day | ORAL | Status: DC
  Administered 2015-09-06 – 2015-09-07 (×2): 10 mg via ORAL
  Filled 2015-09-05 (×3): qty 1

## 2015-09-05 MED ORDER — ACETAMINOPHEN 325 MG PO TABS: 650.0000 mg | ORAL_TABLET | Freq: Four times a day (QID) | ORAL | Status: DC | PRN

## 2015-09-05 MED ORDER — ALBUTEROL SULFATE (2.5 MG/3ML) 0.083% IN NEBU
3.0000 mL | INHALATION_SOLUTION | Freq: Four times a day (QID) | RESPIRATORY_TRACT | Status: DC | PRN
Start: 2015-09-05 — End: 2015-09-07

## 2015-09-05 MED ORDER — TRANEXAMIC ACID 1000 MG/10ML IV SOLN
1000.0000 mg | INTRAVENOUS | Status: AC
  Administered 2015-09-05: 1000 mg via INTRAVENOUS
  Filled 2015-09-05: qty 10

## 2015-09-05 MED ORDER — TRANEXAMIC ACID 1000 MG/10ML IV SOLN
1000.0000 mg | Freq: Once | INTRAVENOUS | Status: AC
  Administered 2015-09-05: 1000 mg via INTRAVENOUS
  Filled 2015-09-05: qty 10

## 2015-09-05 MED ORDER — HYDROMORPHONE HCL 1 MG/ML IJ SOLN
INTRAMUSCULAR | Status: AC
  Filled 2015-09-05: qty 1

## 2015-09-05 MED ORDER — FLUTICASONE FUROATE-VILANTEROL 200-25 MCG/INH IN AEPB
1.0000 | INHALATION_SPRAY | Freq: Every day | RESPIRATORY_TRACT | Status: DC
  Administered 2015-09-06 – 2015-09-07 (×2): 1 via RESPIRATORY_TRACT
  Filled 2015-09-05: qty 28

## 2015-09-05 MED ORDER — METOCLOPRAMIDE HCL 5 MG/ML IJ SOLN: 5.0000 mg | Freq: Three times a day (TID) | INTRAMUSCULAR | Status: DC | PRN

## 2015-09-05 MED ORDER — ONDANSETRON HCL 4 MG/2ML IJ SOLN
INTRAMUSCULAR | Status: AC
  Filled 2015-09-05: qty 2

## 2015-09-05 MED ORDER — HYDROMORPHONE HCL 1 MG/ML IJ SOLN
0.5000 mg | INTRAMUSCULAR | Status: AC | PRN
  Administered 2015-09-05: 0.5 mg via INTRAVENOUS
  Administered 2015-09-05: 0.25 mg via INTRAVENOUS
  Administered 2015-09-05: 0.5 mg via INTRAVENOUS
  Administered 2015-09-05: 0.25 mg via INTRAVENOUS

## 2015-09-05 MED ORDER — ONDANSETRON HCL 4 MG/2ML IJ SOLN: 4.0000 mg | Freq: Four times a day (QID) | INTRAMUSCULAR | Status: DC | PRN

## 2015-09-05 MED ORDER — FENTANYL CITRATE (PF) 100 MCG/2ML IJ SOLN
INTRAMUSCULAR | Status: AC
  Filled 2015-09-05: qty 2

## 2015-09-05 MED ORDER — MIDAZOLAM HCL 2 MG/2ML IJ SOLN
INTRAMUSCULAR | Status: AC
  Filled 2015-09-05: qty 2

## 2015-09-05 MED ORDER — FENTANYL CITRATE (PF) 250 MCG/5ML IJ SOLN
INTRAMUSCULAR | Status: AC
  Filled 2015-09-05: qty 5

## 2015-09-05 MED ORDER — BUPIVACAINE HCL (PF) 0.25 % IJ SOLN
INTRAMUSCULAR | Status: DC | PRN
  Administered 2015-09-05: 30 mL

## 2015-09-05 MED ORDER — DIPHENHYDRAMINE HCL 12.5 MG/5ML PO ELIX: 12.5000 mg | ORAL_SOLUTION | ORAL | Status: DC | PRN

## 2015-09-05 MED ORDER — CHLORHEXIDINE GLUCONATE 4 % EX LIQD: 60.0000 mL | Freq: Once | CUTANEOUS | Status: DC

## 2015-09-05 MED ORDER — DEXAMETHASONE SODIUM PHOSPHATE 10 MG/ML IJ SOLN
10.0000 mg | Freq: Once | INTRAMUSCULAR | Status: AC
  Administered 2015-09-05: 10 mg via INTRAVENOUS

## 2015-09-05 MED ORDER — ACETAMINOPHEN 500 MG PO TABS
1000.0000 mg | ORAL_TABLET | Freq: Four times a day (QID) | ORAL | Status: AC
  Administered 2015-09-05 – 2015-09-06 (×4): 1000 mg via ORAL
  Filled 2015-09-05 (×5): qty 2

## 2015-09-05 MED ORDER — ACETAMINOPHEN 650 MG RE SUPP: 650.0000 mg | Freq: Four times a day (QID) | RECTAL | Status: DC | PRN

## 2015-09-05 MED ORDER — BUPIVACAINE HCL (PF) 0.25 % IJ SOLN
INTRAMUSCULAR | Status: AC
  Filled 2015-09-05: qty 30

## 2015-09-05 MED ORDER — AMLODIPINE BESYLATE 5 MG PO TABS
5.0000 mg | ORAL_TABLET | Freq: Every day | ORAL | Status: DC
  Administered 2015-09-07: 5 mg via ORAL
  Filled 2015-09-05 (×2): qty 1

## 2015-09-05 MED ORDER — MOMETASONE FURO-FORMOTEROL FUM 200-5 MCG/ACT IN AERO
2.0000 | INHALATION_SPRAY | Freq: Two times a day (BID) | RESPIRATORY_TRACT | Status: DC
Start: 2015-09-06 — End: 2015-09-05
  Filled 2015-09-05: qty 8.8

## 2015-09-05 MED ORDER — OXYCODONE HCL 5 MG PO TABS
5.0000 mg | ORAL_TABLET | ORAL | Status: DC | PRN
  Administered 2015-09-06: 5 mg via ORAL
  Administered 2015-09-06 (×2): 10 mg via ORAL
  Administered 2015-09-06: 5 mg via ORAL
  Administered 2015-09-06 – 2015-09-07 (×5): 10 mg via ORAL
  Filled 2015-09-05 (×4): qty 2
  Filled 2015-09-05: qty 1
  Filled 2015-09-05 (×2): qty 2
  Filled 2015-09-05: qty 1
  Filled 2015-09-05 (×3): qty 2

## 2015-09-05 MED ORDER — ROCURONIUM BROMIDE 100 MG/10ML IV SOLN
INTRAVENOUS | Status: DC | PRN
  Administered 2015-09-05: 35 mg via INTRAVENOUS

## 2015-09-05 MED ORDER — FLEET ENEMA 7-19 GM/118ML RE ENEM: 1.0000 | ENEMA | Freq: Once | RECTAL | Status: DC | PRN

## 2015-09-05 MED ORDER — DEXAMETHASONE SODIUM PHOSPHATE 10 MG/ML IJ SOLN
10.0000 mg | Freq: Once | INTRAMUSCULAR | Status: AC
  Administered 2015-09-06: 10 mg via INTRAVENOUS
  Filled 2015-09-05: qty 1

## 2015-09-05 MED ORDER — HYDROMORPHONE HCL 1 MG/ML IJ SOLN
0.5000 mg | INTRAMUSCULAR | Status: DC | PRN
  Administered 2015-09-05 (×2): 0.25 mg via INTRAVENOUS

## 2015-09-05 MED ORDER — LIDOCAINE HCL (CARDIAC) 20 MG/ML IV SOLN
INTRAVENOUS | Status: DC | PRN
  Administered 2015-09-05: 50 mg via INTRAVENOUS

## 2015-09-05 MED ORDER — LEVOTHYROXINE SODIUM 88 MCG PO TABS
88.0000 ug | ORAL_TABLET | Freq: Every day | ORAL | Status: DC
  Administered 2015-09-06 – 2015-09-07 (×2): 88 ug via ORAL
  Filled 2015-09-05 (×3): qty 1

## 2015-09-05 MED ORDER — POLYETHYLENE GLYCOL 3350 17 G PO PACK: 17.0000 g | PACK | Freq: Every day | ORAL | Status: DC | PRN

## 2015-09-05 MED ORDER — METOCLOPRAMIDE HCL 10 MG PO TABS: 5.0000 mg | ORAL_TABLET | Freq: Three times a day (TID) | ORAL | Status: DC | PRN

## 2015-09-05 MED ORDER — DEXTROSE 5 % IV SOLN
2.0000 g | INTRAVENOUS | Status: AC
  Administered 2015-09-05: 2 g via INTRAVENOUS
  Filled 2015-09-05: qty 20

## 2015-09-05 MED ORDER — PHENOL 1.4 % MT LIQD: 1.0000 | OROMUCOSAL | Status: DC | PRN

## 2015-09-05 MED ORDER — PROMETHAZINE HCL 25 MG/ML IJ SOLN: 6.2500 mg | INTRAMUSCULAR | Status: DC | PRN

## 2015-09-05 MED ORDER — LACTATED RINGERS IV SOLN
INTRAVENOUS | Status: DC | PRN
  Administered 2015-09-05 (×2): via INTRAVENOUS

## 2015-09-05 MED ORDER — SUGAMMADEX SODIUM 200 MG/2ML IV SOLN
INTRAVENOUS | Status: DC | PRN
  Administered 2015-09-05: 200 mg via INTRAVENOUS

## 2015-09-05 MED ORDER — FENTANYL CITRATE (PF) 100 MCG/2ML IJ SOLN
INTRAMUSCULAR | Status: DC | PRN
  Administered 2015-09-05 (×4): 50 ug via INTRAVENOUS

## 2015-09-05 MED ORDER — LORATADINE 10 MG PO TABS
10.0000 mg | ORAL_TABLET | Freq: Every day | ORAL | Status: DC
  Administered 2015-09-06: 10 mg via ORAL
  Filled 2015-09-05 (×2): qty 1

## 2015-09-05 MED ORDER — PROPOFOL 10 MG/ML IV BOLUS
INTRAVENOUS | Status: DC | PRN
  Administered 2015-09-05: 100 mg via INTRAVENOUS

## 2015-09-05 MED ORDER — SODIUM CHLORIDE 0.9 % IV SOLN
INTRAVENOUS | Status: DC
  Administered 2015-09-05: 21:00:00 via INTRAVENOUS

## 2015-09-05 MED ORDER — ONDANSETRON HCL 4 MG PO TABS: 4.0000 mg | ORAL_TABLET | Freq: Four times a day (QID) | ORAL | Status: DC | PRN

## 2015-09-05 MED ORDER — CEFAZOLIN SODIUM-DEXTROSE 2-4 GM/100ML-% IV SOLN
2.0000 g | Freq: Four times a day (QID) | INTRAVENOUS | Status: AC
  Administered 2015-09-05 – 2015-09-06 (×2): 2 g via INTRAVENOUS
  Filled 2015-09-05 (×2): qty 100

## 2015-09-05 MED ORDER — ONDANSETRON HCL 4 MG/2ML IJ SOLN
INTRAMUSCULAR | Status: DC | PRN
  Administered 2015-09-05: 4 mg via INTRAVENOUS

## 2015-09-05 MED ORDER — DOCUSATE SODIUM 100 MG PO CAPS
100.0000 mg | ORAL_CAPSULE | Freq: Two times a day (BID) | ORAL | Status: DC
  Administered 2015-09-05 – 2015-09-07 (×4): 100 mg via ORAL

## 2015-09-05 MED ORDER — FENTANYL CITRATE (PF) 100 MCG/2ML IJ SOLN: 25.0000 ug | INTRAMUSCULAR | Status: DC | PRN

## 2015-09-05 MED ORDER — CEFAZOLIN SODIUM-DEXTROSE 2-3 GM-% IV SOLR
INTRAVENOUS | Status: AC
  Filled 2015-09-05: qty 50

## 2015-09-05 SURGICAL SUPPLY — 35 items
BAG DECANTER FOR FLEXI CONT (MISCELLANEOUS) ×3 IMPLANT
BAG SPEC THK2 15X12 ZIP CLS (MISCELLANEOUS)
BAG ZIPLOCK 12X15 (MISCELLANEOUS) IMPLANT
BLADE SAG 18X100X1.27 (BLADE) ×3 IMPLANT
CAPT HIP TOTAL 2 ×2 IMPLANT
CLOSURE WOUND 1/2 X4 (GAUZE/BANDAGES/DRESSINGS) ×1
CLOTH BEACON ORANGE TIMEOUT ST (SAFETY) ×3 IMPLANT
COVER PERINEAL POST (MISCELLANEOUS) ×3 IMPLANT
DECANTER SPIKE VIAL GLASS SM (MISCELLANEOUS) ×3 IMPLANT
DRAPE STERI IOBAN 125X83 (DRAPES) ×3 IMPLANT
DRAPE U-SHAPE 47X51 STRL (DRAPES) ×6 IMPLANT
DRSG ADAPTIC 3X8 NADH LF (GAUZE/BANDAGES/DRESSINGS) ×3 IMPLANT
DRSG MEPILEX BORDER 4X4 (GAUZE/BANDAGES/DRESSINGS) ×3 IMPLANT
DRSG MEPILEX BORDER 4X8 (GAUZE/BANDAGES/DRESSINGS) ×3 IMPLANT
DURAPREP 26ML APPLICATOR (WOUND CARE) ×3 IMPLANT
ELECT REM PT RETURN 9FT ADLT (ELECTROSURGICAL) ×3
ELECTRODE REM PT RTRN 9FT ADLT (ELECTROSURGICAL) ×1 IMPLANT
EVACUATOR 1/8 PVC DRAIN (DRAIN) ×3 IMPLANT
GLOVE BIO SURGEON STRL SZ7.5 (GLOVE) ×3 IMPLANT
GLOVE BIO SURGEON STRL SZ8 (GLOVE) ×6 IMPLANT
GLOVE BIOGEL PI IND STRL 8 (GLOVE) ×2 IMPLANT
GLOVE BIOGEL PI INDICATOR 8 (GLOVE) ×4
GOWN STRL REUS W/TWL LRG LVL3 (GOWN DISPOSABLE) ×3 IMPLANT
GOWN STRL REUS W/TWL XL LVL3 (GOWN DISPOSABLE) ×3 IMPLANT
PACK ANTERIOR HIP CUSTOM (KITS) ×3 IMPLANT
STRIP CLOSURE SKIN 1/2X4 (GAUZE/BANDAGES/DRESSINGS) ×2 IMPLANT
SUT ETHIBOND NAB CT1 #1 30IN (SUTURE) ×3 IMPLANT
SUT MNCRL AB 4-0 PS2 18 (SUTURE) ×3 IMPLANT
SUT VIC AB 2-0 CT1 27 (SUTURE) ×9
SUT VIC AB 2-0 CT1 TAPERPNT 27 (SUTURE) ×2 IMPLANT
SUT VLOC 180 0 24IN GS25 (SUTURE) ×5 IMPLANT
SYR 50ML LL SCALE MARK (SYRINGE) IMPLANT
TRAY FOLEY W/METER SILVER 14FR (SET/KITS/TRAYS/PACK) ×3 IMPLANT
TRAY FOLEY W/METER SILVER 16FR (SET/KITS/TRAYS/PACK) ×1 IMPLANT
YANKAUER SUCT BULB TIP 10FT TU (MISCELLANEOUS) ×3 IMPLANT

## 2015-09-05 NOTE — H&P (View-Only) (Signed)
Linda Rice DOB: [birth year] Divorced / Language: Linda Rice / Race: White Female Date of Admission:  09/05/2015 CC:  Left Hip Pain History of Present Illness The patient is a 75 year old female who comes in for a preoperative History and Physical. The patient is scheduled for a left total hip arthroplasty (anterior) to be performed by Dr. Dione Rice. Aluisio, MD at [facility] on 09-05-2015. The patient is a 75 year old female who presents with a hip problem. The patient was seen for a second opinion.The patient reports left hip problems including pain, grinding, stiffness and diffiulty getting in an out of the car symptoms that have been present for month(s). The symptoms began without any known injury. Symptoms reported include hip pain The patient reports symptoms radiating to the: left groin and left thigh anteriorly. The patient describes the hip problem as sharp.The symptoms are described as severe.The patient feels as if their symptoms are does feel they are worsening. Symptoms are exacerbated by movement and flexing hip. Current treatment includes nonsteroidal anti-inflammatory drugs (ibuprofen, sparingly. She states that Dr. Durward Rice gave her a prescription for tramadol, but it did not help.). Prior to being seen, the patient was previously evaluated by a colleague (Dr. Durward Rice). Previous workup for this problem has included hip x-rays. Previous treatment for this problem has included corticosteroid injection (by Dr. Ernestina Rice in October 2016. She states the her knee began to feel as if it was buckling a few hours after the injection. She states that she was scheduled to go a trip to [place], and had to use a cane to get around. Due to the continued weakness, she had an injection in her back. She had back pain previously for years, but once she started getting pain in groin, she noticed her back was no longer bothering her. She did not really get significant relief after the back  injection.). Linda Rice is at a stage now where her left hip is hurting her at all times. It is definitely limiting what she can and cannot do. She hurts day and night. It got really progressively worse when she went to the far [place] on her vacation in October. Since then, she has had this significant groin pain. She does not have back pain, lower extremity weakness, or paresthesia with this. She hurts day and night. She is not having any right-sided pain. She has previously had knee replacements by Dr. Durward Rice and did well with those. She is now ready to proceed with hip replacement. They have been treated conservatively in the past for the above stated problem and despite conservative measures, they continue to have progressive pain and severe functional limitations and dysfunction. They have failed non-operative management including home exercise, medications. It is felt that they would benefit from undergoing total joint replacement. Risks and benefits of the procedure have been discussed with the patient and they elect to proceed with surgery. There are no active contraindications to surgery such as ongoing infection or rapidly progressive neurological disease.  Problem List/Past Medical Primary osteoarthritis of left hip (M16.12)  Anxiety Disorder  Asthma  Autoimmune disorder  Hypothyroidism  Oophorectomy  left Osteoarthritis  Hypertension  Hypercholesterolemia  Urinary Incontinence  Urinary Tract Infection  Hypothyroidism  Osteopenia  Alopecia Totalus  Osteomyelitis  Right Knee Measles  Rubella  Eczema  Allergies Erythromycin *MACROLIDES*  Hives. Latex  Allergies Reconciled  Levaquin *Fluoroquinolones**  Hives. 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  Medication History AmLODIPine Besylate (5MG  Tablet, Oral) Active. Advair Diskus (100-50MCG/DOSE Aero Pow Br Act, Inhalation) Active. Clobetasol Propionate (0.05% Solution, External) Active. Levothyroxine Sodium (88MCG Tablet, Oral) Active. ProAir HFA (108 (90 Base)MCG/ACT Aerosol Soln, Inhalation) Active. Doxycycline Hyclate (100MG  Capsule, Oral) Active. Sulfamethoxazole-Trimethoprim (400-80MG  Tablet, Oral) Active.  Past Surgical History Appendectomy  Breast Mass; Local Excision  right Carpal Tunnel Repair  right Cataract Surgery  bilateral Dilation and Curettage of Uterus  Gallbladder Surgery  laporoscopic Tonsillectomy  Total Knee Replacement  bilateral Tubal Ligation  Resection of Right Total Knee Replacement  Reimplantation of Right Total Knee Replacement    Review of Systems General Not Present- Chills, Fatigue, Fever, Memory Loss, Night Sweats, Weight Gain and Weight Loss. Skin Not Present- Eczema, Hives, Itching, Lesions and Rash. HEENT Not Present- Dentures, Double Vision, Headache, Hearing Loss, Tinnitus and Visual Loss. Respiratory Present- Shortness of breath with  exertion (mild). Not Present- Allergies, Chronic Cough, Coughing up blood and Shortness of breath at rest. Cardiovascular Not Present- Chest Pain, Difficulty Breathing Lying Down, Murmur, Palpitations, Racing/skipping heartbeats and Swelling. Gastrointestinal Not Present- Abdominal Pain, Bloody Stool, Constipation, Diarrhea, Difficulty Swallowing, Heartburn, Jaundice, Loss of appetitie, Nausea and Vomiting. Female Genitourinary Present- Incontinence (mild). Not Present- Blood in Urine, Discharge, Flank Pain, Painful Urination, Urgency, Urinary frequency, Urinary Retention, Urinating at Night and Weak urinary stream. Musculoskeletal Present- Back Pain and Joint Pain. Not Present- Joint Swelling, Morning Stiffness, Muscle Pain, Muscle Weakness and Spasms. Neurological Not Present- Blackout spells, Difficulty with balance, Dizziness, Paralysis, Tremor and Weakness. Psychiatric Not Present- Insomnia.  Vitals  Weight: 220 lb Height: 64in Body Surface Area: 2.04 m Body Mass Index: 37.76 kg/m  Pulse: 64 (Regular)  Resp.: 16 (Unlabored)  BP: 128/78 (Sitting, Right Arm, Standard)  Physical Exam The physical exam findings are as follows: Note:Patient a 75 year old female. Patient is accompanied today by Margreta Journey.  General Mental Status -Alert, cooperative and good historian, teary-eyed at time of exam. General Appearance-pleasant, In mild distress secondary to moderate hip pain Orientation-Oriented X3. Build & Nutrition-Well nourished and Well developed.  Head and Neck Head-normocephalic, atraumatic . Neck Global Assessment - supple, no bruit auscultated on the right, no bruit auscultated on the left.  Eye Vision-Wears corrective lenses(readers). Pupil - Bilateral-Regular and Round. Motion - Bilateral-EOMI.  ENMT Note: upper denture plate   Chest and Lung Exam Auscultation Breath sounds - clear at anterior chest wall and clear at posterior chest wall.  Adventitious sounds - No Adventitious sounds.  Cardiovascular Auscultation Rhythm - Regular rate and rhythm(with an occasional ectopic beat). Heart Sounds - S1 WNL and S2 WNL. Murmurs &  Other Heart Sounds - Auscultation of the heart reveals - No Murmurs.  Abdomen Palpation/Percussion Tenderness - Abdomen is non-tender to palpation. Rigidity (guarding) - Abdomen is soft. Auscultation Auscultation of the abdomen reveals - Bowel sounds normal.  Female Genitourinary Note: Not done, not pertinent to present illness   Musculoskeletal Note: A well-developed female in no distress. Her right hip can be flexed to 120, rotated in 30, out 40, abducted to 40 without discomfort. Left hip flexion to about 90, minimal internal rotation, about 10 to 20 of external rotation, 20 of abduction. She has a lot of pain with any attempted motion of the hip.  RADIOGRAPHS AP pelvis, lateral left hip bone-on-bone arthritis, complete loss of joint space with no erosion of the femoral head or acetabulum. Right hip looks normal. No bony masses noted.  Assessment & Plan Primary osteoarthritis of left hip (M16.12)  Note:Surgical Plans: Left Total Hip Replacement - Anterior Approach  Disposition: Home  PCP: Dr. [name]  IV TXA  Anesthesia Issues: None  Signed electronically by Ok Edwards, III PA-C

## 2015-09-05 NOTE — Transfer of Care (Signed)
Immediate Anesthesia Transfer of Care Note  Patient: Linda Rice  Procedure(s) Performed: Procedure(s): LEFT TOTAL HIP ARTHROPLASTY ANTERIOR APPROACH (Left)  Patient Location: PACU  Anesthesia Type:General  Level of Consciousness: awake, alert  and oriented  Airway & Oxygen Therapy: Patient Spontanous Breathing and Patient connected to face mask oxygen  Post-op Assessment: Report given to RN and Post -op Vital signs reviewed and stable  Post vital signs: Reviewed and stable  Last Vitals:  Filed Vitals:   09/05/15 1253  BP: 143/68  Pulse: 83  Temp: 36.9 C  Resp: 16    Complications: No apparent anesthesia complications

## 2015-09-05 NOTE — Anesthesia Postprocedure Evaluation (Signed)
Anesthesia Post Note  Patient: Linda Rice  Procedure(s) Performed: Procedure(s) (LRB): LEFT TOTAL HIP ARTHROPLASTY ANTERIOR APPROACH (Left)  Patient location during evaluation: PACU Anesthesia Type: General Level of consciousness: awake and alert Pain management: pain level controlled Vital Signs Assessment: post-procedure vital signs reviewed and stable Respiratory status: spontaneous breathing, nonlabored ventilation, respiratory function stable and patient connected to nasal cannula oxygen Cardiovascular status: blood pressure returned to baseline and stable Postop Assessment: no signs of nausea or vomiting Anesthetic complications: no    Last Vitals:  Filed Vitals:   09/05/15 1931 09/05/15 1948  BP: 122/62 110/58  Pulse: 60 63  Temp: 36.2 C 36.4 C  Resp: 17 17    Last Pain:  Filed Vitals:   09/05/15 1950  PainSc: 4                  Tiajuana Amass

## 2015-09-05 NOTE — Interval H&P Note (Signed)
History and Physical Interval Note:  09/05/2015 3:26 PM  Linda Rice  has presented today for surgery, with the diagnosis of LEFT HIP OA   The various methods of treatment have been discussed with the patient and family. After consideration of risks, benefits and other options for treatment, the patient has consented to  Procedure(s): LEFT TOTAL HIP ARTHROPLASTY ANTERIOR APPROACH (Left) as a surgical intervention .  [name] history has been reviewed, patient examined, no change in status, stable for surgery.  I have reviewed the patient's chart and labs.  Questions were answered to the patient's satisfaction.     Gearlean Alf

## 2015-09-05 NOTE — Op Note (Signed)
OPERATIVE REPORT  PREOPERATIVE DIAGNOSIS: Osteoarthritis of the Left hip.   POSTOPERATIVE DIAGNOSIS: Osteoarthritis of the Left  hip.   PROCEDURE: Left total hip arthroplasty, anterior approach.   SURGEON: [name], MD   ASSISTANT: [name], PA-C  ANESTHESIA:  General  ESTIMATED BLOOD LOSS:-550 ml   DRAINS: Hemovac x1.   COMPLICATIONS: None   CONDITION: PACU - hemodynamically stable.   BRIEF CLINICAL NOTE: Linda Rice is a 75 y.o. female who has advanced end-  stage arthritis of her Left  hip with progressively worsening pain and  dysfunction.The patient has failed nonoperative management and presents for  total hip arthroplasty.   PROCEDURE IN DETAIL: After successful administration of spinal  anesthetic, the traction boots for the [facility] bed were placed on both  feet and the patient was placed onto the [facility] bed, boots placed into the leg  holders. The Left hip was then isolated from the perineum with plastic  drapes and prepped and draped in the usual sterile fashion. ASIS and  greater trochanter were marked and a oblique incision was made, starting  at about 1 cm lateral and 2 cm distal to the ASIS and coursing towards  the anterior cortex of the femur. The skin was cut with a 10 blade  through subcutaneous tissue to the level of the fascia overlying the  tensor fascia lata muscle. The fascia was then incised in line with the  incision at the junction of the anterior third and posterior 2/3rd. The  muscle was teased off the fascia and then the interval between the TFL  and the rectus was developed. The Hohmann retractor was then placed at  the top of the femoral neck over the capsule. The vessels overlying the  capsule were cauterized and the fat on top of the capsule was removed.  A Hohmann retractor was then placed anterior underneath the rectus  femoris to give exposure to the entire anterior capsule. A T-shaped  capsulotomy was performed.  The edges were tagged and the femoral head  was identified.       Osteophytes are removed off the superior acetabulum.  The femoral neck was then cut in situ with an oscillating saw. Traction  was then applied to the left lower extremity utilizing the [facility]  traction. The femoral head was then removed. Retractors were placed  around the acetabulum and then circumferential removal of the labrum was  performed. Osteophytes were also removed. Reaming starts at 45 mm to  medialize and  Increased in 2 mm increments to 49 mm. We reamed in  approximately 40 degrees of abduction, 20 degrees anteversion. A 50 mm  pinnacle acetabular shell was then impacted in anatomic position under  fluoroscopic guidance with excellent purchase. We did not need to place  any additional dome screws. A 32 mm neutral + 4 marathon liner was then  placed into the acetabular shell.       The femoral lift was then placed along the lateral aspect of the femur  just distal to the vastus ridge. The leg was  externally rotated and capsule  was stripped off the inferior aspect of the femoral neck down to the  level of the lesser trochanter, this was done with electrocautery. The femur was lifted after this was performed. The  leg was then placed and extended in adducted position to essentially delivering the femur. We also removed the capsule superiorly and the  piriformis from the  piriformis fossa to gain excellent exposure of the  proximal femur. Rongeur was used to remove some cancellous bone to get  into the lateral portion of the proximal femur for placement of the  initial starter reamer. The starter broaches was placed  the starter broach  and was shown to go down the center of the canal. Broaching  with the  Corail system was then performed starting at size 8, coursing  Up to size 13. A size 13 had excellent torsional and rotational  and axial stability. The trial standard offset neck was then placed  with a 32 + 1  trial head. The hip was then reduced. We confirmed that  the stem was in the canal both on AP and lateral x-rays. It also has excellent sizing. The hip was reduced with outstanding stability through full extension, full external rotation,  and then flexion in adduction internal rotation. AP pelvis was taken  and the leg lengths were measured and found to be exactly equal. Hip  was then dislocated again and the femoral head and neck removed. The  femoral broach was removed. Size 13 Corail stem with a standard offset  neck was then impacted into the femur following native anteversion. Has  excellent purchase in the canal. Excellent torsional and rotational and  axial stability. It is confirmed to be in the canal on AP and lateral  fluoroscopic views. The 32 + 1 ceamic head was placed and the hip  reduced with outstanding stability. Again AP pelvis was taken and it  confirmed that the leg lengths were equal. The wound was then copiously  irrigated with saline solution and the capsule reattached and repaired  with Ethibond suture. 30 ml of .25% Bupivicaine injected into the capsule and into the edge of the tensor fascia lata as well as subcutaneous tissue. The fascia overlying the tensor fascia lata was  then closed with a running #1 V-Loc. Subcu was closed with interrupted  2-0 Vicryl and subcuticular running 4-0 Monocryl. Incision was cleaned  and dried. Steri-Strips and a bulky sterile dressing applied. Hemovac  drain was hooked to suction and then he was awakened and transported to  recovery in stable condition.        Please note that a surgical assistant was a medical necessity for this procedure to perform it in a safe and expeditious manner. Assistant was necessary to provide appropriate retraction of vital neurovascular structures and to prevent femoral fracture and allow for anatomic placement of the prosthesis.  [name], M.D.

## 2015-09-05 NOTE — Anesthesia Preprocedure Evaluation (Addendum)
Anesthesia Evaluation  Patient identified by MRN, date of birth, ID band Patient awake    Reviewed: Allergy & Precautions, NPO status , Patient's Chart, lab work & pertinent test results  Airway Mallampati: II  TM Distance: >3 FB Neck ROM: Full    Dental   Pulmonary asthma ,    breath sounds clear to auscultation       Cardiovascular hypertension, Pt. on medications  Rhythm:Regular Rate:Normal     Neuro/Psych negative neurological ROS     GI/Hepatic negative GI ROS, Neg liver ROS,   Endo/Other  Hypothyroidism   Renal/GU negative Renal ROS     Musculoskeletal  (+) Arthritis ,   Abdominal   Peds  Hematology negative hematology ROS (+)   Anesthesia Other Findings   Reproductive/Obstetrics                            Lab Results  Component Value Date   WBC 7.6 08/26/2015   HGB 14.7 08/26/2015   HCT 45.8 08/26/2015   MCV 91.8 08/26/2015   PLT 313 08/26/2015   Lab Results  Component Value Date   INR 0.97 08/26/2015   INR 1.4 01/17/2008   INR 1.6* 01/16/2008   Lab Results  Component Value Date   CREATININE 0.79 08/26/2015   BUN 14 08/26/2015   NA 143 08/26/2015   K 4.0 08/26/2015   CL 103 08/26/2015   CO2 28 08/26/2015    Anesthesia Physical Anesthesia Plan  ASA: II  Anesthesia Plan: Spinal   Post-op Pain Management:    Induction: Intravenous  Airway Management Planned: Natural Airway and Simple Face Mask  Additional Equipment:   Intra-op Plan:   Post-operative Plan:   Informed Consent: I have reviewed the patients History and Physical, chart, labs and discussed the procedure including the risks, benefits and alternatives for the proposed anesthesia with the patient or authorized representative who has indicated his/her understanding and acceptance.   Dental advisory given  Plan Discussed with: CRNA  Anesthesia Plan Comments:        Anesthesia Quick  Evaluation

## 2015-09-05 NOTE — Anesthesia Procedure Notes (Signed)
Procedure Name: Intubation Date/Time: 09/05/2015 4:00 PM Performed by: Olimpia Tinch, Virgel Gess Pre-anesthesia Checklist: Patient identified, Emergency Drugs available, Suction available, Patient being monitored and Timeout performed Patient Re-evaluated:Patient Re-evaluated prior to inductionOxygen Delivery Method: Circle system utilized Preoxygenation: Pre-oxygenation with 100% oxygen Intubation Type: IV induction Ventilation: Mask ventilation without difficulty Laryngoscope Size: Mac and 4 Grade View: Grade II Tube type: Oral Tube size: 7.5 mm Number of attempts: 1 Airway Equipment and Method: Stylet Placement Confirmation: ETT inserted through vocal cords under direct vision,  positive ETCO2,  CO2 detector and breath sounds checked- equal and bilateral Secured at: 21 cm Tube secured with: Tape Dental Injury: Teeth and Oropharynx as per pre-operative assessment

## 2015-09-06 ENCOUNTER — Encounter (HOSPITAL_COMMUNITY): Payer: Self-pay | Admitting: Orthopedic Surgery

## 2015-09-06 LAB — BASIC METABOLIC PANEL
ANION GAP: 9 (ref 5–15)
BUN: 12 mg/dL (ref 6–20)
CHLORIDE: 106 mmol/L (ref 101–111)
CO2: 24 mmol/L (ref 22–32)
CREATININE: 0.66 mg/dL (ref 0.44–1.00)
Calcium: 8.2 mg/dL — ABNORMAL LOW (ref 8.9–10.3)
GFR calc non Af Amer: >60 mL/min (ref >60)
Glucose, Bld: 163 mg/dL — ABNORMAL HIGH (ref 65–99)
POTASSIUM: 4.5 mmol/L (ref 3.5–5.1)
Sodium: 139 mmol/L (ref 135–145)

## 2015-09-06 LAB — CBC
HEMATOCRIT: 34.8 % — AB (ref 36.0–46.0)
HEMOGLOBIN: 11.7 g/dL — AB (ref 12.0–15.0)
MCH: 29.8 pg (ref 26.0–34.0)
MCHC: 33.6 g/dL (ref 30.0–36.0)
MCV: 88.8 fL (ref 78.0–100.0)
Platelets: 250 10*3/uL (ref 150–400)
RBC: 3.92 MIL/uL (ref 3.87–5.11)
RDW: 13.7 % (ref 11.5–15.5)
WBC: 8.5 10*3/uL (ref 4.0–10.5)

## 2015-09-06 NOTE — Progress Notes (Signed)
   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  Per pt, she was delighted by chaplain visit.  Provided pastoral presence, reflective listening, and encouragement.  [place] was in good spirits, noting that her stress about surgery has nearly abated since procedure.  She verbalized gratitude for and excitement about the anticipated decrease in pain and increase in mobility.    [place], [place], [facility] Pager [phone] Voicemail  [phone]

## 2015-09-06 NOTE — Evaluation (Signed)
Physical Therapy Evaluation Patient Details Name: Linda Rice MRN: [id number] DOB: September 01, 1940 Today's Date: 09/06/2015   History of Present Illness  75 yo female s/p L THA-direct anterior 09/05/14.   Clinical Impression  On eval, pt required Min assist for mobility-walked ~55 feet with RW. Pain rated 4/10. Pt lives alone and does not have any assistance available at home. Recommend ST rehab at SNF.     Follow Up Recommendations SNF    Equipment Recommendations  Rolling walker with 5" wheels    Recommendations for Other Services       Precautions / Restrictions Precautions Precautions: Fall Restrictions Weight Bearing Restrictions: No LLE Weight Bearing: Weight bearing as tolerated      Mobility  Bed Mobility Overal bed mobility: Needs Assistance Bed Mobility: Supine to Sit     Supine to sit: Min assist     General bed mobility comments: Assist for trunk to upright. Increased time. Moderate reliance on bedrail  Transfers Overall transfer level: Needs assistance Equipment used: Rolling walker (2 wheeled) Transfers: Sit to/from Stand Sit to Stand: Min assist         General transfer comment: Assist to rise, stabilize, control descent. VCs safety, technique, hand placement.   Ambulation/Gait Ambulation/Gait assistance: Min assist Ambulation Distance (Feet): 55 Feet Assistive device: Rolling walker (2 wheeled) Gait Pattern/deviations: Step-to pattern;Decreased step length - left;Antalgic;Trunk flexed     General Gait Details: Assist to stabilize. VCs safety, sequence, step length.   Stairs            Wheelchair Mobility    Modified Rankin (Stroke Patients Only)       Balance                                             Pertinent Vitals/Pain Pain Assessment: 0-10 Pain Score: 4  Pain Location: L thigh area Pain Descriptors / Indicators: Sore Pain Intervention(s): Limited activity within patient's  tolerance;Repositioned;Ice applied    Home Living Family/patient expects to be discharged to:: Skilled nursing facility Living Arrangements: Alone (no help available at home)   Type of Home: House Home Access: Level entry     Home Layout: One level Home Equipment: Cane - single point;Grab bars - toilet;Grab bars - tub/shower      Prior Function Level of Independence: Independent with assistive device(s)         Comments: using cane prior to surgery     Hand Dominance        Extremity/Trunk Assessment   Upper Extremity Assessment: Defer to OT evaluation           Lower Extremity Assessment: Generalized weakness      Cervical / Trunk Assessment: Normal  Communication   Communication: No difficulties  Cognition Arousal/Alertness: Awake/alert Behavior During Therapy: WFL for tasks assessed/performed Overall Cognitive Status: Within Functional Limits for tasks assessed                      General Comments      Exercises Total Joint Exercises Ankle Circles/Pumps: AROM;Both;10 reps;Supine Quad Sets: AROM;Both;10 reps;Supine Heel Slides: AAROM;Left;10 reps;Supine Hip ABduction/ADduction: AAROM;Left;10 reps;Supine (limited abduction ROM)      Assessment/Plan    PT Assessment Patient needs continued PT services  PT Diagnosis Difficulty walking;Generalized weakness;Acute pain   PT Problem List Decreased strength;Decreased range of motion;Decreased activity tolerance;Decreased balance;Decreased mobility;Decreased knowledge  of use of DME;Pain  PT Treatment Interventions DME instruction;Gait training;Functional mobility training;Therapeutic activities;Patient/family education;Balance training;Therapeutic exercise   PT Goals (Current goals can be found in the Care Plan section) Acute Rehab PT Goals Patient Stated Goal: regain independence PT Goal Formulation: With patient Time For Goal Achievement: 09/13/15 Potential to Achieve Goals: Good     Frequency 7X/week   Barriers to discharge        Co-evaluation               End of Session Equipment Utilized During Treatment: Gait belt Activity Tolerance: Patient limited by fatigue Patient left: in chair;with call bell/phone within reach;with chair alarm set           Time: 1000-1024 PT Time Calculation (min) (ACUTE ONLY): 24 min   Charges:   PT Evaluation $PT Eval Low Complexity: 1 Procedure PT Treatments $Gait Training: 8-22 mins   PT G Codes:        Weston Anna, MPT Pager: [phone]

## 2015-09-06 NOTE — Progress Notes (Signed)
Physical Therapy Treatment Patient Details Name: Jasime Sill MRN: [id number] DOB: Apr 02, 1941 Today's Date: 09/06/2015    History of Present Illness 75 yo female s/p L THA-direct anterior 09/05/14.     PT Comments    Progressing with mobility. Pt continues to fatigue fairly easily with ambulation. Increased distance this session but pt required 2 standing rest breaks. Daughters present. Pt states one daughter will be in town until Saturday and can assist if pt discharges home. Pt stated she will discuss d/c further with ortho MD on tomorrow.   Follow Up Recommendations  SNF     Equipment Recommendations  Rolling walker with 5" wheels    Recommendations for Other Services       Precautions / Restrictions Precautions Precautions: Fall Restrictions Weight Bearing Restrictions: No LLE Weight Bearing: Weight bearing as tolerated    Mobility  Bed Mobility Overal bed mobility: Needs Assistance Bed Mobility: Supine to Sit     Supine to sit: Min assist     General bed mobility comments: oob in recliner  Transfers Overall transfer level: Needs assistance Equipment used: Rolling walker (2 wheeled) Transfers: Sit to/from Stand Sit to Stand: Min guard         General transfer comment: close guard for safety. Increased time.   Ambulation/Gait Ambulation/Gait assistance: Min guard Ambulation Distance (Feet): 115 Feet Assistive device: Rolling walker (2 wheeled) Gait Pattern/deviations: Step-through pattern;Decreased stride length     General Gait Details: close guard for safety. 2 brief standing rest breaks taken due to fatigue. Dyspnea 2/4.    Stairs            Wheelchair Mobility    Modified Rankin (Stroke Patients Only)       Balance                                    Cognition Arousal/Alertness: Awake/alert Behavior During Therapy: WFL for tasks assessed/performed Overall Cognitive Status: Within Functional Limits for tasks  assessed                      Exercises Total Joint Exercises Ankle Circles/Pumps: AROM;Both;10 reps;Supine Quad Sets: AROM;Both;10 reps;Supine Heel Slides: AAROM;Left;10 reps;Supine Hip ABduction/ADduction: AAROM;Left;10 reps;Supine (limited abduction ROM)    General Comments        Pertinent Vitals/Pain Pain Assessment: 0-10 Pain Score: 2  Pain Location: L thigh Pain Descriptors / Indicators: Sore Pain Intervention(s): Monitored during session;Ice applied;Repositioned    Home Living                      Prior Function            PT Goals (current goals can now be found in the care plan section) Acute Rehab PT Goals Patient Stated Goal: regain independence PT Goal Formulation: With patient Time For Goal Achievement: 09/13/15 Potential to Achieve Goals: Good Progress towards PT goals: Progressing toward goals    Frequency  7X/week    PT Plan Current plan remains appropriate    Co-evaluation             End of Session Equipment Utilized During Treatment: Gait belt Activity Tolerance: Patient tolerated treatment well Patient left: in chair;with call bell/phone within reach;with chair alarm set;with family/visitor present     Time: [id number] PT Time Calculation (min) (ACUTE ONLY): 12 min  Charges:  $Gait Training: 8-22 mins  G Codes:      Weston Anna, MPT Pager: [phone]

## 2015-09-06 NOTE — Care Management Note (Signed)
Case Management Note  Patient Details  Name: Linda Rice MRN: [id number] Date of Birth: 1940-11-14  Subjective/Objective:                  LEFT TOTAL HIP ARTHROPLASTY ANTERIOR APPROACH (Left) Action/Plan: Discharge planning Expected Discharge Date:  09/07/15               Expected Discharge Plan:  [place]  In-House Referral:     Discharge planning Services  CM Consult  Post Acute Care Choice:    Choice offered to:     DME Arranged:    DME Agency:     HH Arranged:    [place] Agency:     Status of Service:  Completed, signed off  Medicare Important Message Given:    Date Medicare IM Given:    Medicare IM give by:    Date Additional Medicare IM Given:    Additional Medicare Important Message give by:     If discussed at [place] of Stay Meetings, dates discussed:    Additional Comments: Utilization Review complete. CM notes pt to go to SNF for rehab; CSW arranging.  No other CM needs were communicated. Dellie Catholic, RN 09/06/2015, 12:10 PM

## 2015-09-06 NOTE — Evaluation (Signed)
Occupational Therapy Evaluation Patient Details Name: Linda Rice MRN: [id number] DOB: Jan 04, 1941 Today's Date: 09/06/2015    History of Present Illness 75 yo female s/p L THA-direct anterior 09/05/14.    Clinical Impression   Pt was admitted for the above surgery. At baseline, she lives in [facility] independent living section.  She was independent/mod I for adls.  Pt currently needs min to mod A for LB adls and min guard for SPT to 3:1. Will follow in acute setting with min guard to supervision level goals.      Follow Up Recommendations  SNF depending upon progress.  Pt lives alone; daughter here until Sat   Equipment Recommendations  None recommended by OT (likely)    Recommendations for Other Services       Precautions / Restrictions Precautions Precautions: Fall Restrictions Weight Bearing Restrictions: No LLE Weight Bearing: Weight bearing as tolerated      Mobility Bed Mobility               General bed mobility comments: oob in recliner  Transfers Overall transfer level: Needs assistance Equipment used: Rolling walker (2 wheeled) Transfers: Sit to/from Stand Sit to Stand: Min guard         General transfer comment: close guard for safety. Increased time.     Balance                                            ADL Overall ADL's : Needs assistance/impaired     Grooming: Set up;Sitting   Upper Body Bathing: Set up;Sitting   Lower Body Bathing: Min guard;Sit to/from stand;With adaptive equipment   Upper Body Dressing : Set up;Sitting   Lower Body Dressing: Moderate assistance;Sit to/from stand;With adaptive equipment   Toilet Transfer: Min guard;Stand-pivot;BSC (cues for LLE placement)             General ADL Comments: Pt practiced with sock aide.  she has a long sponge and reacher at home. She is able to cross RLE under L to lift up.  Pt with increased pain in LLE, so we did not use AE on this leg.        Vision     Perception     Praxis      Pertinent Vitals/Pain Pain Assessment: No/denies pain Pain Score: 4  Pain Location: L thigh Pain Descriptors / Indicators: Sore Pain Intervention(s): Limited activity within patient's tolerance;Monitored during session;Premedicated before session;Repositioned;Ice applied     Hand Dominance     Extremity/Trunk Assessment Upper Extremity Assessment Upper Extremity Assessment: Overall WFL for tasks assessed           Communication Communication Communication: No difficulties   Cognition Arousal/Alertness: Awake/alert Behavior During Therapy: WFL for tasks assessed/performed Overall Cognitive Status: Within Functional Limits for tasks assessed                     General Comments       Exercises       Shoulder Instructions      Home Living Family/patient expects to be discharged to:: Skilled nursing facility Living Arrangements: Alone                               Additional Comments: has assistance til Saturday. Pt has ADA toilet with grab bar, walk  in shower with seat and grab bar      Prior Functioning/Environment Level of Independence: Independent with assistive device(s)             OT Diagnosis: Generalized weakness   OT Problem List: Decreased strength;Decreased activity tolerance;Decreased knowledge of use of DME or AE;Pain   OT Treatment/Interventions: Self-care/ADL training;DME and/or AE instruction;Patient/family education    OT Goals(Current goals can be found in the care plan section) Acute Rehab OT Goals Patient Stated Goal: regain independence OT Goal Formulation: With patient Time For Goal Achievement: 09/13/15 Potential to Achieve Goals: Good ADL Goals Pt Will Perform Lower Body Bathing: with min guard assist;sit to/from stand;with adaptive equipment Pt Will Perform Lower Body Dressing: with min guard assist;with adaptive equipment;sit to/from stand Pt Will Transfer to  Toilet: with supervision;ambulating;grab bars (high commode) Pt Will Perform Tub/Shower Transfer: Shower transfer;with min guard assist;ambulating;shower seat;3 in 1  OT Frequency: Min 2X/week   Barriers to D/C:            Co-evaluation              End of Session    Activity Tolerance: Patient tolerated treatment well Patient left: in chair;with call bell/phone within reach;with chair alarm set;with family/visitor present   Time: [id number] OT Time Calculation (min): 32 min Charges:  OT General Charges $OT Visit: 1 Procedure OT Evaluation $OT Eval Low Complexity: 1 Procedure OT Treatments $Self Care/Home Management : 8-22 mins G-Codes:    Quinlin Conant 2015/09/20, 3:16 PM Lesle Chris, OTR/L [phone] Sep 20, 2015

## 2015-09-06 NOTE — Clinical Social Work Note (Signed)
Clinical Social Work Assessment  Patient Details  Name: Linda Rice MRN: [id number] Date of Birth: 24-Jul-1940  Date of referral:  09/06/15               Reason for consult:  Facility Placement, Discharge Planning                Permission sought to share information with:  Facility Art therapist granted to share information::  Yes, Verbal Permission Granted  Name::        Agency::     Relationship::     Contact Information:     Housing/Transportation Living arrangements for the past 2 months:  [place] of Information:  Patient, Facility Patient Interpreter Needed:  None Criminal Activity/Legal Involvement Pertinent to Current Situation/Hospitalization:  No - Comment as needed Significant Relationships:  Adult Children Lives with:  Self Do you feel safe going back to the place where you live?  No (SNF needed.) Need for family participation in patient care:  No (Coment)  Care giving concerns:  Pt's care cannot be managed at home following hospital d/c.   Social Worker assessment / plan: Pt hospitalized on 09/05/15 for pre planned left total hip arthroplasty. CSW has met with pt to assist with d/c planning. Pt has made prior arrangements to have rehab at [facility] of [place]. Pt is from their Independent community. PT has recommended SNF at d/c. CSW has contacted SNF and d/c plan has been confirmed. CSW will continue to follow to assist with d/c planning needs.   Employment status:  Retired Forensic scientist:  Medicare PT Recommendations:  [place] / Referral to community resources:  [place]  Patient/Family's Response to care:  Pt feels rehab is needed.  Patient/Family's Understanding of and Emotional Response to Diagnosis, Current Treatment, and Prognosis:  Pt is aware of er medical status. " I feel I really need rehab before I return to my apt. "  Emotional  Assessment Appearance:  Appears stated age Attitude/Demeanor/Rapport:  Other (cooperative) Affect (typically observed):  Calm, Appropriate Orientation:  Oriented to Self, Oriented to Place, Oriented to  Time, Oriented to Situation Alcohol / Substance use:  Not Applicable Psych involvement (Current and /or in the community):  No (Comment)  Discharge Needs  Concerns to be addressed:  Discharge Planning Concerns Readmission within the last 30 days:  No Current discharge risk:  None Barriers to Discharge:  No Barriers Identified   Loraine Maple  [phone] 09/06/2015, 3:36 PM

## 2015-09-06 NOTE — NC FL2 (Signed)
[place] LEVEL OF CARE SCREENING TOOL     IDENTIFICATION  Patient Name: Linda Rice Birthdate: 12/25/1940 Sex: female Admission Date (Current Location): 09/05/2015  [facility] and [place] Number:  Herbalist and Address:  [facility],  [place] [address] [place]      Provider Number: [id number]  Attending Physician Name and Address:  [name], MD  Relative Name and Phone Number:       Current Level of Care: Hospital Recommended Level of Care: [place] Prior Approval Number:    Date Approved/Denied:   PASRR Number: [id number]  Discharge Plan: SNF    Current Diagnoses: Patient Active Problem List   Diagnosis Date Noted  . OA (osteoarthritis) of hip 09/05/2015  . ASTHMA 02/05/2008  . CELLULITIS 02/05/2008  . ECZEMA 02/05/2008  . Knee joint replacement by other means 02/05/2008    Orientation RESPIRATION BLADDER Height & Weight     Self, Time, Situation, Place  Normal Continent Weight: 98.884 kg (218 lb) Height:  5\' 4"  (162.6 cm)  BEHAVIORAL SYMPTOMS/MOOD NEUROLOGICAL BOWEL NUTRITION STATUS  Other (Comment) (no behaviors)   Continent Diet  AMBULATORY STATUS COMMUNICATION OF NEEDS Skin   Limited Assist Verbally Surgical wounds                       Personal Care Assistance Level of Assistance  Bathing, Feeding, Dressing Bathing Assistance: Limited assistance Feeding assistance: Independent Dressing Assistance: Limited assistance     Functional Limitations Info  Sight, Hearing, Speech Sight Info: Adequate Hearing Info: Adequate Speech Info: Adequate    SPECIAL CARE FACTORS FREQUENCY  PT (By licensed PT), OT (By licensed OT)     PT Frequency: 5 x wk OT Frequency: 5 x wk            Contractures Contractures Info: Not present    Additional Factors Info  Code Status Code Status Info: Full Code             Current Medications (09/06/2015):  This is the current  hospital active medication list Current Facility-Administered Medications  Medication Dose Route Frequency Provider Last Rate Last Dose  . 0.9 %  sodium chloride infusion   Intravenous Continuous [name], PA-C 20 mL/hr at 09/06/15 1136    . acetaminophen (TYLENOL) tablet 650 mg  650 mg Oral Q6H PRN [name], MD       Or  . acetaminophen (TYLENOL) suppository 650 mg  650 mg Rectal Q6H PRN [name], MD      . acetaminophen (TYLENOL) tablet 1,000 mg  1,000 mg Oral 4 times per day [name], MD   1,000 mg at 09/06/15 0344  . albuterol (PROVENTIL) (2.5 MG/3ML) 0.083% nebulizer solution 3 mL  3 mL Inhalation Q6H PRN [name], MD      . amLODipine (NORVASC) tablet 5 mg  5 mg Oral Daily [name], PA-C   5 mg at 09/06/15 1132  . bisacodyl (DULCOLAX) suppository 10 mg  10 mg Rectal Daily PRN [name], MD      . diphenhydrAMINE (BENADRYL) 12.5 MG/5ML elixir 12.5-25 mg  12.5-25 mg Oral Q4H PRN [name], MD      . docusate sodium (COLACE) capsule 100 mg  100 mg Oral BID [name], MD   100 mg at 09/05/15 2133  . doxycycline (VIBRA-TABS) tablet 100 mg  100 mg Oral BID [name], MD   100 mg at 09/06/15 1132  .  fluticasone furoate-vilanterol (BREO ELLIPTA) 200-25 MCG/INH 1 puff  1 puff Inhalation Daily [name], MD   1 puff at 09/06/15 0912  . levothyroxine (SYNTHROID, LEVOTHROID) tablet 88 mcg  88 mcg Oral QAC breakfast [name], MD   88 mcg at 09/06/15 0734  . loratadine (CLARITIN) tablet 10 mg  10 mg Oral Daily [name], MD   10 mg at 09/06/15 1131  . menthol-cetylpyridinium (CEPACOL) lozenge 3 mg  1 lozenge Oral PRN [name], MD       Or  . phenol (CHLORASEPTIC) mouth spray 1 spray  1 spray Mouth/Throat PRN [name], MD      . methocarbamol (ROBAXIN) tablet 500 mg  500 mg Oral Q6H PRN [name], MD   500 mg at 09/06/15 0734   Or  . methocarbamol (ROBAXIN) 500 mg in dextrose 5 % 50 mL IVPB  500 mg Intravenous Q6H PRN [name],  MD      . metoCLOPramide (REGLAN) tablet 5-10 mg  5-10 mg Oral Q8H PRN [name], MD       Or  . metoCLOPramide (REGLAN) injection 5-10 mg  5-10 mg Intravenous Q8H PRN [name], MD      . morphine 2 MG/ML injection 1 mg  1 mg Intravenous Q2H PRN [name], MD   1 mg at 09/06/15 1130  . ondansetron (ZOFRAN) tablet 4 mg  4 mg Oral Q6H PRN [name], MD       Or  . ondansetron [facility]) injection 4 mg  4 mg Intravenous Q6H PRN [name], MD      . oxyCODONE (Oxy IR/ROXICODONE) immediate release tablet 5-10 mg  5-10 mg Oral Q3H PRN [name], MD   10 mg at 09/06/15 1132  . polyethylene glycol (MIRALAX / GLYCOLAX) packet 17 g  17 g Oral Daily PRN [name], MD      . rivaroxaban Alveda Reasons) tablet 10 mg  10 mg Oral Q breakfast [name], MD   10 mg at 09/06/15 0734  . sodium phosphate (FLEET) 7-19 GM/118ML enema 1 enema  1 enema Rectal Once PRN [name], MD      . traMADol Veatrice Bourbon) tablet 50-100 mg  50-100 mg Oral Q6H PRN [name], MD         Discharge Medications: Please see discharge summary for a list of discharge medications.  Relevant Imaging Results:  Relevant Lab Results:   Additional Information SS # [id number]  Koah Chisenhall, Randall An, LCSW

## 2015-09-06 NOTE — Progress Notes (Signed)
   Subjective: 1 Day Post-Op Procedure(s) (LRB): LEFT TOTAL HIP ARTHROPLASTY ANTERIOR APPROACH (Left) Patient reports pain as mild and moderate.   Patient seen in rounds with Dr. Wynelle Link. Patient is well, but has had some minor complaints of pain in the hip, requiring pain medications We will start therapy today.  Plan is to go Home versus [facility] after hospital stay.  Objective: Vital signs in last 24 hours: Temp:  [97.2 F (36.2 C)-98.8 F (37.1 C)] 98.7 F (37.1 C) (03/28 0450) Pulse Rate:  [58-90] 60 (03/28 0450) Resp:  [12-24] 20 (03/28 0450) BP: (80-143)/(44-73) 123/53 mmHg (03/28 0450) SpO2:  [97 %-100 %] 98 % (03/28 0450) Weight:  [98.884 kg (218 lb)] 98.884 kg (218 lb) (03/27 1325)  Intake/Output from previous day:  Intake/Output Summary (Last 24 hours) at 09/06/15 0848 Last data filed at 09/06/15 0553  Gross per 24 hour  Intake   2830 ml  Output   2819 ml  Net     11 ml    Labs:  Recent Labs  09/06/15 0412  HGB 11.7*    Recent Labs  09/06/15 0412  WBC 8.5  RBC 3.92  HCT 34.8*  PLT 250    Recent Labs  09/06/15 0412  NA 139  K 4.5  CL 106  CO2 24  BUN 12  CREATININE 0.66  GLUCOSE 163*  CALCIUM 8.2*   No results for input(s): LABPT, INR in the last 72 hours.  EXAM General - Patient is Alert, Appropriate and Oriented Extremity - Neurovascular intact Sensation intact distally Dorsiflexion/Plantar flexion intact Dressing - dressing C/D/I Motor Function - intact, moving foot and toes well on exam.  Hemovac pulled without difficulty.  Past Medical History  Diagnosis Date  . Allergy   . Arthritis   . Asthma   . Thyroid disease   . Complication of anesthesia     "I CRY WHEN I WAKE UP"  . Hypertension   . Osteopenia   . Eczema   . Bladder incontinence     "I HAVE A SLIGHT PROBLEM"  . Alopecia   . History of transfusion     AUTOGOLUS    Assessment/Plan: 1 Day Post-Op Procedure(s) (LRB): LEFT TOTAL HIP ARTHROPLASTY  ANTERIOR APPROACH (Left) Principal Problem:   OA (osteoarthritis) of hip  Estimated body mass index is 37.4 kg/(m^2) as calculated from the following:   Height as of this encounter: 5\' 4"  (1.626 m).   Weight as of this encounter: 98.884 kg (218 lb). Advance diet Up with therapy Discharge home with home health versus SNF/[facility]  DVT Prophylaxis - Xarelto Weight Bearing As Tolerated left Leg Hemovac Pulled Begin Therapy  [name], PA-C Orthopaedic Surgery 09/06/2015, 8:48 AM

## 2015-09-06 NOTE — Discharge Instructions (Addendum)
° °Dr. Frank Aluisio °Total Joint Specialist °[facility] °[address] °[place], [place] [address] °[phone] ° °ANTERIOR APPROACH TOTAL HIP REPLACEMENT POSTOPERATIVE DIRECTIONS ° ° °Hip Rehabilitation, Guidelines Following Surgery  °The results of a hip operation are greatly improved after range of motion and muscle strengthening exercises. Follow all safety measures which are given to protect your hip. If any of these exercises cause increased pain or swelling in your joint, decrease the amount until you are comfortable again. Then slowly increase the exercises. Call your caregiver if you have problems or questions.  ° °HOME CARE INSTRUCTIONS  °Remove items at home which could result in a fall. This includes throw rugs or furniture in walking pathways.  °· ICE to the affected hip every three hours for 30 minutes at a time and then as needed for pain and swelling.  Continue to use ice on the hip for pain and swelling from surgery. You may notice swelling that will progress down to the foot and ankle.  This is normal after surgery.  Elevate the leg when you are not up walking on it.   °· Continue to use the breathing machine which will help keep your temperature down.  It is common for your temperature to cycle up and down following surgery, especially at night when you are not up moving around and exerting yourself.  The breathing machine keeps your lungs expanded and your temperature down. ° ° °DIET °You may resume your previous home diet once your are discharged from the hospital. ° °DRESSING / WOUND CARE / SHOWERING °You may shower 3 days after surgery, but keep the wounds dry during showering.  You may use an occlusive plastic wrap (Press'n Seal for example), NO SOAKING/SUBMERGING IN THE BATHTUB.  If the bandage gets wet, change with a clean dry gauze.  If the incision gets wet, pat the wound dry with a clean towel. °You may start showering once you are discharged home but do not  submerge the incision under water. Just pat the incision dry and apply a dry gauze dressing on daily. °Change the surgical dressing daily and reapply a dry dressing each time. ° °ACTIVITY °Walk with your walker as instructed. °Use walker as long as suggested by your caregivers. °Avoid periods of inactivity such as sitting longer than an hour when not asleep. This helps prevent blood clots.  °You may resume a sexual relationship in one month or when given the OK by your doctor.  °You may return to work once you are cleared by your doctor.  °Do not drive a car for 6 weeks or until released by you surgeon.  °Do not drive while taking narcotics. ° °WEIGHT BEARING °Weight bearing as tolerated with assist device (walker, cane, etc) as directed, use it as long as suggested by your surgeon or therapist, typically at least 4-6 weeks. ° °POSTOPERATIVE CONSTIPATION PROTOCOL °Constipation - defined medically as fewer than three stools per week and severe constipation as less than one stool per week. ° °One of the most common issues patients have following surgery is constipation.  Even if you have a regular bowel pattern at home, your normal regimen is likely to be disrupted due to multiple reasons following surgery.  Combination of anesthesia, postoperative narcotics, change in appetite and fluid intake all can affect your bowels.  In order to avoid complications following surgery, here are some recommendations in order to help you during your recovery period. ° °Colace (docusate) - Pick up an over-the-counter   form of Colace or another stool softener and take twice a day as long as you are requiring postoperative pain medications.  Take with a full glass of water daily.  If you experience loose stools or diarrhea, hold the colace until you stool forms back up.  If your symptoms do not get better within 1 week or if they get worse, check with your doctor. ° °Dulcolax (bisacodyl) - Pick up over-the-counter and take as directed  by the product packaging as needed to assist with the movement of your bowels.  Take with a full glass of water.  Use this product as needed if not relieved by Colace only.  ° °MiraLax (polyethylene glycol) - Pick up over-the-counter to have on hand.  MiraLax is a solution that will increase the amount of water in your bowels to assist with bowel movements.  Take as directed and can mix with a glass of water, juice, soda, coffee, or tea.  Take if you go more than two days without a movement. °Do not use MiraLax more than once per day. Call your doctor if you are still constipated or irregular after using this medication for 7 days in a row. ° °If you continue to have problems with postoperative constipation, please contact the office for further assistance and recommendations.  If you experience "the worst abdominal pain ever" or develop nausea or vomiting, please contact the office immediatly for further recommendations for treatment. ° °ITCHING ° If you experience itching with your medications, try taking only a single pain pill, or even half a pain pill at a time.  You can also use Benadryl over the counter for itching or also to help with sleep.  ° °TED HOSE STOCKINGS °Wear the elastic stockings on both legs for three weeks following surgery during the day but you may remove then at night for sleeping. ° °MEDICATIONS °See your medication summary on the “After Visit Summary” that the nursing staff will review with you prior to discharge.  You may have some home medications which will be placed on hold until you complete the course of blood thinner medication.  It is important for you to complete the blood thinner medication as prescribed by your surgeon.  Continue your approved medications as instructed at time of discharge. ° °PRECAUTIONS °If you experience chest pain or shortness of breath - call 911 immediately for transfer to the hospital emergency department.  °If you develop a fever greater that 101 F,  purulent drainage from wound, increased redness or drainage from wound, foul odor from the wound/dressing, or calf pain - CONTACT YOUR SURGEON.   °                                                °FOLLOW-UP APPOINTMENTS °Make sure you keep all of your appointments after your operation with your surgeon and caregivers. You should call the office at the above phone number and make an appointment for approximately two weeks after the date of your surgery or on the date instructed by your surgeon outlined in the "After Visit Summary". ° °RANGE OF MOTION AND STRENGTHENING EXERCISES  °These exercises are designed to help you keep full movement of your hip joint. Follow your caregiver's or physical therapist's instructions. Perform all exercises about fifteen times, three times per day or as directed. Exercise both hips, even if you   have had only one joint replacement. These exercises can be done on a training (exercise) mat, on the floor, on a table or on a bed. Use whatever works the best and is most comfortable for you. Use music or television while you are exercising so that the exercises are a pleasant break in your day. This will make your life better with the exercises acting as a break in routine you can look forward to.  °Lying on your back, slowly slide your foot toward your buttocks, raising your knee up off the floor. Then slowly slide your foot back down until your leg is straight again.  °Lying on your back spread your legs as far apart as you can without causing discomfort.  °Lying on your side, raise your upper leg and foot straight up from the floor as far as is comfortable. Slowly lower the leg and repeat.  °Lying on your back, tighten up the muscle in the front of your thigh (quadriceps muscles). You can do this by keeping your leg straight and trying to raise your heel off the floor. This helps strengthen the largest muscle supporting your knee.  °Lying on your back, tighten up the muscles of your  buttocks both with the legs straight and with the knee bent at a comfortable angle while keeping your heel on the floor.  ° °IF YOU ARE TRANSFERRED TO A SKILLED REHAB FACILITY °If the patient is transferred to a skilled rehab facility following release from the hospital, a list of the current medications will be sent to the facility for the patient to continue.  When discharged from the skilled rehab facility, please have the facility set up the patient's Home Health Physical Therapy prior to being released. Also, the skilled facility will be responsible for providing the patient with their medications at time of release from the facility to include their pain medication, the muscle relaxants, and their blood thinner medication. If the patient is still at the rehab facility at time of the two week follow up appointment, the skilled rehab facility will also need to assist the patient in arranging follow up appointment in our office and any transportation needs. ° °MAKE SURE YOU:  °Understand these instructions.  °Get help right away if you are not doing well or get worse.  ° ° °Pick up stool softner and laxative for home use following surgery while on pain medications. °Do not submerge incision under water. °Please use good hand washing techniques while changing dressing each day. °May shower starting three days after surgery. °Please use a clean towel to pat the incision dry following showers. °Continue to use ice for pain and swelling after surgery. °Do not use any lotions or creams on the incision until instructed by your surgeon. ° °Take Xarelto for two and a half more weeks, then discontinue Xarelto. °Once the patient has completed the blood thinner regimen, then take a Baby 81 mg Aspirin daily for three more weeks. ° ° °Information on my medicine - XARELTO® (Rivaroxaban) ° °This medication education was reviewed with me or my healthcare representative as part of my discharge preparation.  The pharmacist that  spoke with me during my hospital stay was:  Runyon, Amanda, RPH ° °Why was Xarelto® prescribed for you? °Xarelto® was prescribed for you to reduce the risk of blood clots forming after orthopedic surgery. The medical term for these abnormal blood clots is venous thromboembolism (VTE). ° °What do you need to know about xarelto® ? °Take your Xarelto® ONCE   DAILY at the same time every day. °You may take it either with or without food. ° °If you have difficulty swallowing the tablet whole, you may crush it and mix in applesauce just prior to taking your dose. ° °Take Xarelto® exactly as prescribed by your doctor and DO NOT stop taking Xarelto® without talking to the doctor who prescribed the medication.  Stopping without other VTE prevention medication to take the place of Xarelto® may increase your risk of developing a clot. ° °After discharge, you should have regular check-up appointments with your healthcare provider that is prescribing your Xarelto®.   ° °What do you do if you miss a dose? °If you miss a dose, take it as soon as you remember on the same day then continue your regularly scheduled once daily regimen the next day. Do not take two doses of Xarelto® on the same day.  ° °Important Safety Information °A possible side effect of Xarelto® is bleeding. You should call your healthcare provider right away if you experience any of the following: °? Bleeding from an injury or your nose that does not stop. °? Unusual colored urine (red or dark brown) or unusual colored stools (red or black). °? Unusual bruising for unknown reasons. °? A serious fall or if you hit your head (even if there is no bleeding). ° °Some medicines may interact with Xarelto® and might increase your risk of bleeding while on Xarelto®. To help avoid this, consult your healthcare provider or pharmacist prior to using any new prescription or non-prescription medications, including herbals, vitamins, non-steroidal anti-inflammatory drugs (NSAIDs)  and supplements. ° °This website has more information on Xarelto®: www.xarelto.com. ° ° ° °

## 2015-09-07 LAB — CBC
HEMATOCRIT: 33.2 % — AB (ref 36.0–46.0)
HEMOGLOBIN: 11.3 g/dL — AB (ref 12.0–15.0)
MCH: 29.4 pg (ref 26.0–34.0)
MCHC: 34 g/dL (ref 30.0–36.0)
MCV: 86.5 fL (ref 78.0–100.0)
Platelets: 227 10*3/uL (ref 150–400)
RBC: 3.84 MIL/uL — ABNORMAL LOW (ref 3.87–5.11)
RDW: 13.6 % (ref 11.5–15.5)
WBC: 13.7 10*3/uL — AB (ref 4.0–10.5)

## 2015-09-07 LAB — BASIC METABOLIC PANEL
ANION GAP: 8 (ref 5–15)
BUN: 17 mg/dL (ref 6–20)
CHLORIDE: 110 mmol/L (ref 101–111)
CO2: 22 mmol/L (ref 22–32)
Calcium: 8.9 mg/dL (ref 8.9–10.3)
Creatinine, Ser: 0.73 mg/dL (ref 0.44–1.00)
GLUCOSE: 130 mg/dL — AB (ref 65–99)
Potassium: 4.4 mmol/L (ref 3.5–5.1)
Sodium: 140 mmol/L (ref 135–145)

## 2015-09-07 MED ORDER — OXYCODONE HCL 5 MG PO TABS: 5.0000 mg | ORAL_TABLET | ORAL | Status: DC | PRN

## 2015-09-07 MED ORDER — TRAMADOL HCL 50 MG PO TABS: 50.0000 mg | ORAL_TABLET | Freq: Four times a day (QID) | ORAL | Status: DC | PRN

## 2015-09-07 MED ORDER — RIVAROXABAN 10 MG PO TABS: 10.0000 mg | ORAL_TABLET | Freq: Every day | ORAL | Status: DC

## 2015-09-07 MED ORDER — METHOCARBAMOL 500 MG PO TABS: 500.0000 mg | ORAL_TABLET | Freq: Four times a day (QID) | ORAL | Status: DC | PRN

## 2015-09-07 NOTE — Progress Notes (Signed)
CSW informed by pt / daughter that pt will return to her independent cottage at [place] today. Daughter will stay with pt following hospital d/c. RNCM will assist with d/c planning.  Werner Lean LCSW [phone]

## 2015-09-07 NOTE — Progress Notes (Signed)
Physical Therapy Treatment Patient Details Name: Linda Rice MRN: [id number] DOB: [birth year] Today's Date: 09/07/2015    History of Present Illness 75 yo female s/p L THA-direct anterior 09/05/14.     PT Comments    POD # 2 Pt now plans to D/C to home with help from daughter.  Assisted with amb and performed all TE's following HEP.  Instructed on proper tech and freq as well as use of ICE.  No stairs to enter home and one level.   Follow Up Recommendations  Home health PT (per pt, now plans to D/C to home with daughter's help)     Equipment Recommendations  Rolling walker with 5" wheels    Recommendations for Other Services       Precautions / Restrictions Precautions Precautions: Fall Restrictions LLE Weight Bearing: Weight bearing as tolerated    Mobility  Bed Mobility               General bed mobility comments: Pt OOB in recliner  Transfers Overall transfer level: Needs assistance Equipment used: Rolling walker (2 wheeled) Transfers: Sit to/from Stand Sit to Stand: Supervision         General transfer comment: one VC on safety with turn completion prior to sit  Ambulation/Gait Ambulation/Gait assistance: Supervision Ambulation Distance (Feet): 145 Feet Assistive device: Rolling walker (2 wheeled) Gait Pattern/deviations: Step-through pattern;Decreased stride length Gait velocity: WFL   General Gait Details: tolerated increased distance   Stairs            Wheelchair Mobility    Modified Rankin (Stroke Patients Only)       Balance                                    Cognition Arousal/Alertness: Awake/alert Behavior During Therapy: WFL for tasks assessed/performed Overall Cognitive Status: Within Functional Limits for tasks assessed                      Exercises   Total Hip Replacement TE's 10 reps ankle pumps 10 reps knee presses 10 reps heel slides 10 reps SAQ's 10 reps ABD Followed by  ICE     General Comments        Pertinent Vitals/Pain Pain Assessment: 0-10 Pain Score: 3  Pain Location: L hip Pain Descriptors / Indicators: Tender;Sore Pain Intervention(s): Repositioned;Ice applied;Monitored during session;Patient requesting pain meds-RN notified    Home Living                      Prior Function            PT Goals (current goals can now be found in the care plan section) Progress towards PT goals: Progressing toward goals    Frequency  7X/week    PT Plan Current plan remains appropriate    Co-evaluation             End of Session Equipment Utilized During Treatment: Gait belt Activity Tolerance: Patient tolerated treatment well Patient left: in chair;with call bell/phone within reach;with chair alarm set;with family/visitor present     Time: [id number] PT Time Calculation (min) (ACUTE ONLY): 26 min  Charges:  $Gait Training: 8-22 mins $Therapeutic Exercise: 8-22 mins                    G Codes:      Augustine Leverette  PTA [employer]  Acute  Rehab Pager      [phone]

## 2015-09-07 NOTE — Discharge Summary (Signed)
Physician Discharge Summary   Patient ID: Linda Rice MRN: [id number] DOB/AGE: 74-Jan-1942 74 y.o.  Admit date: 09/05/2015 Discharge date: 09-07-15  Primary Diagnosis:  Osteoarthritis of the Left hip.  Admission Diagnoses:  Past Medical History  Diagnosis Date  . Allergy   . Arthritis   . Asthma   . Thyroid disease   . Complication of anesthesia     "I CRY WHEN I WAKE UP"  . Hypertension   . Osteopenia   . Eczema   . Bladder incontinence     "I HAVE A SLIGHT PROBLEM"  . Alopecia   . History of transfusion     AUTOGOLUS   Discharge Diagnoses:   Principal Problem:   OA (osteoarthritis) of hip  Estimated body mass index is 37.4 kg/(m^2) as calculated from the following:   Height as of this encounter: 5' 4" (1.626 m).   Weight as of this encounter: 98.884 kg (218 lb).  Procedure(s) (LRB): LEFT TOTAL HIP ARTHROPLASTY ANTERIOR APPROACH (Left)   Consults: None  HPI: Linda Rice is a 76 y.o. female who has advanced end-  stage arthritis of her Left hip with progressively worsening pain and  dysfunction.The patient has failed nonoperative management and presents for  total hip arthroplasty.   Laboratory Data: Admission on 09/05/2015  Component Date Value Ref Range Status  . ABO/RH(D) 09/05/2015 A POS   Final  . Antibody Screen 09/05/2015 NEG   Final  . Sample Expiration 09/05/2015 09/08/2015   Final  . Unit Number 09/05/2015 [id number]   Final  . Blood Component Type 09/05/2015 RED CELLS,LR   Final  . Unit division 09/05/2015 00   Final  . Status of Unit 09/05/2015 ALLOCATED   Final  . Donor AG Type 09/05/2015 NEGATIVE FOR E ANTIGEN NEGATIVE FOR KELL ANTIGEN   Final  . Transfusion Status 09/05/2015 OK TO TRANSFUSE   Final  . Crossmatch Result 09/05/2015 COMPATIBLE   Final  . WBC 09/06/2015 8.5  4.0 - 10.5 K/uL Final  . RBC 09/06/2015 3.92  3.87 - 5.11 MIL/uL Final  . Hemoglobin 09/06/2015 11.7* 12.0 - 15.0 g/dL Final  . HCT 09/06/2015 34.8*  36.0 - 46.0 % Final  . MCV 09/06/2015 88.8  78.0 - 100.0 fL Final  . MCH 09/06/2015 29.8  26.0 - 34.0 pg Final  . MCHC 09/06/2015 33.6  30.0 - 36.0 g/dL Final  . RDW 09/06/2015 13.7  11.5 - 15.5 % Final  . Platelets 09/06/2015 250  150 - 400 K/uL Final  . Sodium 09/06/2015 139  135 - 145 mmol/L Final  . Potassium 09/06/2015 4.5  3.5 - 5.1 mmol/L Final  . Chloride 09/06/2015 106  101 - 111 mmol/L Final  . CO2 09/06/2015 24  22 - 32 mmol/L Final  . Glucose, Bld 09/06/2015 163* 65 - 99 mg/dL Final  . BUN 09/06/2015 12  6 - 20 mg/dL Final  . Creatinine, Ser 09/06/2015 0.66  0.44 - 1.00 mg/dL Final  . Calcium 09/06/2015 8.2* 8.9 - 10.3 mg/dL Final  . GFR calc non Af Amer 09/06/2015 >60  >60 mL/min Final  . GFR calc Af Amer 09/06/2015 >60  >60 mL/min Final   Comment: (NOTE) The eGFR has been calculated using the CKD EPI equation. This calculation has not been validated in all clinical situations. eGFR's persistently <60 mL/min signify possible Chronic Kidney Disease.   . Anion gap 09/06/2015 9  5 - 15 Final  . WBC 09/07/2015 13.7* 4.0 - 10.5 K/uL  Final  . RBC 09/07/2015 3.84* 3.87 - 5.11 MIL/uL Final  . Hemoglobin 09/07/2015 11.3* 12.0 - 15.0 g/dL Final  . HCT 09/07/2015 33.2* 36.0 - 46.0 % Final  . MCV 09/07/2015 86.5  78.0 - 100.0 fL Final  . MCH 09/07/2015 29.4  26.0 - 34.0 pg Final  . MCHC 09/07/2015 34.0  30.0 - 36.0 g/dL Final  . RDW 09/07/2015 13.6  11.5 - 15.5 % Final  . Platelets 09/07/2015 227  150 - 400 K/uL Final  . Sodium 09/07/2015 140  135 - 145 mmol/L Final  . Potassium 09/07/2015 4.4  3.5 - 5.1 mmol/L Final  . Chloride 09/07/2015 110  101 - 111 mmol/L Final  . CO2 09/07/2015 22  22 - 32 mmol/L Final  . Glucose, Bld 09/07/2015 130* 65 - 99 mg/dL Final  . BUN 09/07/2015 17  6 - 20 mg/dL Final  . Creatinine, Ser 09/07/2015 0.73  0.44 - 1.00 mg/dL Final  . Calcium 09/07/2015 8.9  8.9 - 10.3 mg/dL Final  . GFR calc non Af Amer 09/07/2015 >60  >60 mL/min Final  . GFR  calc Af Amer 09/07/2015 >60  >60 mL/min Final   Comment: (NOTE) The eGFR has been calculated using the CKD EPI equation. This calculation has not been validated in all clinical situations. eGFR's persistently <60 mL/min signify possible Chronic Kidney Disease.   Georgiann Hahn gap 09/07/2015 8  5 - 15 Final  Hospital Outpatient Visit on 08/26/2015  Component Date Value Ref Range Status  . MRSA, PCR 08/26/2015 NEGATIVE  NEGATIVE Final  . Staphylococcus aureus 08/26/2015 NEGATIVE  NEGATIVE Final   Comment:        The Xpert SA Assay (FDA approved for NASAL specimens in patients over 28 years of age), is one component of a comprehensive surveillance program.  Test performance has been validated by [facility] for patients greater than or equal to 75 year old. It is not intended to diagnose infection nor to guide or monitor treatment.   Marland Kitchen aPTT 08/26/2015 30  24 - 37 seconds Final  . WBC 08/26/2015 7.6  4.0 - 10.5 K/uL Final  . RBC 08/26/2015 4.99  3.87 - 5.11 MIL/uL Final  . Hemoglobin 08/26/2015 14.7  12.0 - 15.0 g/dL Final  . HCT 08/26/2015 45.8  36.0 - 46.0 % Final  . MCV 08/26/2015 91.8  78.0 - 100.0 fL Final  . MCH 08/26/2015 29.5  26.0 - 34.0 pg Final  . MCHC 08/26/2015 32.1  30.0 - 36.0 g/dL Final  . RDW 08/26/2015 13.5  11.5 - 15.5 % Final  . Platelets 08/26/2015 313  150 - 400 K/uL Final  . Sodium 08/26/2015 143  135 - 145 mmol/L Final  . Potassium 08/26/2015 4.0  3.5 - 5.1 mmol/L Final  . Chloride 08/26/2015 103  101 - 111 mmol/L Final  . CO2 08/26/2015 28  22 - 32 mmol/L Final  . Glucose, Bld 08/26/2015 102* 65 - 99 mg/dL Final  . BUN 08/26/2015 14  6 - 20 mg/dL Final  . Creatinine, Ser 08/26/2015 0.79  0.44 - 1.00 mg/dL Final  . Calcium 08/26/2015 9.5  8.9 - 10.3 mg/dL Final  . Total Protein 08/26/2015 7.1  6.5 - 8.1 g/dL Final  . Albumin 08/26/2015 4.1  3.5 - 5.0 g/dL Final  . AST 08/26/2015 42* 15 - 41 U/L Final  . ALT 08/26/2015 16  14 - 54 U/L Final  . Alkaline  Phosphatase 08/26/2015 74  38 - 126 U/L Final  .  Total Bilirubin 08/26/2015 0.5  0.3 - 1.2 mg/dL Final  . GFR calc non Af Amer 08/26/2015 >60  >60 mL/min Final  . GFR calc Af Amer 08/26/2015 >60  >60 mL/min Final   Comment: (NOTE) The eGFR has been calculated using the CKD EPI equation. This calculation has not been validated in all clinical situations. eGFR's persistently <60 mL/min signify possible Chronic Kidney Disease.   . Anion gap 08/26/2015 12  5 - 15 Final  . Prothrombin Time 08/26/2015 13.1  11.6 - 15.2 seconds Final  . INR 08/26/2015 0.97  0.00 - 1.49 Final  . ABO/RH(D) 08/26/2015 A POS   Final  . Antibody Screen 08/26/2015 NEG   Final  . Sample Expiration 08/26/2015 08/29/2015   Final  . Extend sample reason 08/26/2015 NO TRANSFUSIONS OR PREGNANCY IN THE PAST 3 MONTHS   Final  . Color, Urine 08/26/2015 AMBER* YELLOW Final   BIOCHEMICALS MAY BE AFFECTED BY COLOR  . APPearance 08/26/2015 CLOUDY* CLEAR Final  . Specific Gravity, Urine 08/26/2015 1.020  1.005 - 1.030 Final  . pH 08/26/2015 6.5  5.0 - 8.0 Final  . Glucose, UA 08/26/2015 NEGATIVE  NEGATIVE mg/dL Final  . Hgb urine dipstick 08/26/2015 NEGATIVE  NEGATIVE Final  . Bilirubin Urine 08/26/2015 SMALL* NEGATIVE Final  . Ketones, ur 08/26/2015 15* NEGATIVE mg/dL Final  . Protein, ur 08/26/2015 NEGATIVE  NEGATIVE mg/dL Final  . Nitrite 08/26/2015 NEGATIVE  NEGATIVE Final  . Leukocytes, UA 08/26/2015 SMALL* NEGATIVE Final  . Squamous Epithelial / LPF 08/26/2015 0-5* NONE SEEN Final  . WBC, UA 08/26/2015 0-5  0 - 5 WBC/hpf Final  . RBC / HPF 08/26/2015 0-5  0 - 5 RBC/hpf Final  . Bacteria, UA 08/26/2015 RARE* NONE SEEN Final     X-Rays:Dg Pelvis Portable  09/05/2015  CLINICAL DATA:  Status post left hip replacement. EXAM: PORTABLE PELVIS 1-2 VIEWS COMPARISON:  None. FINDINGS: The femoral and acetabular components appear to be well situated. Surgical drain is seen in the adjacent soft tissues. No fracture or dislocation  is noted. IMPRESSION: Status post left total hip arthroplasty. Electronically Signed   By: Marijo Conception, M.D.   On: 09/05/2015 18:39   Dg C-arm 61-120 Min-no Report  09/05/2015  CLINICAL DATA: surgery C-ARM 61-120 MINUTES Fluoroscopy was utilized by the requesting physician.  No radiographic interpretation.    EKG: Orders placed or performed during the hospital encounter of 08/26/15  . EKG 12 lead  . EKG 12 lead     Hospital Course: Patient was admitted to [facility] and taken to the OR and underwent the above state procedure without complications.  Patient tolerated the procedure well and was later transferred to the recovery room and then to the orthopaedic floor for postoperative care.  They were given PO and IV analgesics for pain control following their surgery.  They were given 24 hours of postoperative antibiotics of  Anti-infectives    Start     Dose/Rate Route Frequency Ordered Stop   09/06/15 1000  doxycycline (VIBRA-TABS) tablet 100 mg     100 mg Oral 2 times daily 09/05/15 2000     09/06/15 0600  ceFAZolin (ANCEF) 2 g in dextrose 5 % 50 mL IVPB     2 g 140 mL/hr over 30 Minutes Intravenous On call to O.R. 09/05/15 1256 09/05/15 1555   09/05/15 2200  ceFAZolin (ANCEF) IVPB 2g/100 mL premix     2 g 200 mL/hr over 30 Minutes Intravenous Every 6 hours  09/05/15 2000 09/06/15 0414     and started on DVT prophylaxis in the form of Xarelto.   PT and OT were ordered for total hip protocol.  The patient was allowed to be WBAT with therapy. Discharge planning was consulted to help with postop disposition and equipment needs. Social worker was consulted for possible need for SNF.  Patient had a decnt night on the evening of surgery.  They started to get up OOB with therapy on day one.  Hemovac drain was pulled without difficulty.  Continued to work with therapy into day two.  Dressing was changed on day two and the incision was healing well.   Patient was seen in rounds on POD 2  and was ready to go home.  Discharge home with home health Diet - Cardiac diet Follow up - in 2 weeks Activity - WBAT Disposition - Home Condition Upon Discharge - Good D/C Meds - See DC Summary DVT Prophylaxis - Xarelto  Discharge Instructions    Call MD / Call 911    Complete by:  As directed   If you experience chest pain or shortness of breath, CALL 911 and be transported to the hospital emergency room.  If you develope a fever above 101 F, pus (white drainage) or increased drainage or redness at the wound, or calf pain, call your surgeon's office.     Change dressing    Complete by:  As directed   You may change your dressing dressing daily with sterile 4 x 4 inch gauze dressing and paper tape.  Do not submerge the incision under water.     Constipation Prevention    Complete by:  As directed   Drink plenty of fluids.  Prune juice may be helpful.  You may use a stool softener, such as Colace (over the counter) 100 mg twice a day.  Use MiraLax (over the counter) for constipation as needed.     Diet - low sodium heart healthy    Complete by:  As directed      Discharge instructions    Complete by:  As directed   Pick up stool softner and laxative for home use following surgery while on pain medications. Do not submerge incision under water. Please use good hand washing techniques while changing dressing each day. May shower starting three days after surgery. Please use a clean towel to pat the incision dry following showers. Continue to use ice for pain and swelling after surgery. Do not use any lotions or creams on the incision until instructed by your surgeon.  Total Hip Protocol.  Take Xarelto for two and a half more weeks, then discontinue Xarelto. Once the patient has completed the blood thinner regimen, then take a Baby 81 mg Aspirin daily for three more weeks.  Postoperative Constipation Protocol  Constipation - defined medically as fewer than three stools per week  and severe constipation as less than one stool per week.  One of the most common issues patients have following surgery is constipation.  Even if you have a regular bowel pattern at home, your normal regimen is likely to be disrupted due to multiple reasons following surgery.  Combination of anesthesia, postoperative narcotics, change in appetite and fluid intake all can affect your bowels.  In order to avoid complications following surgery, here are some recommendations in order to help you during your recovery period.  Colace (docusate) - Pick up an over-the-counter form of Colace or another stool softener and take twice a  day as long as you are requiring postoperative pain medications.  Take with a full glass of water daily.  If you experience loose stools or diarrhea, hold the colace until you stool forms back up.  If your symptoms do not get better within 1 week or if they get worse, check with your doctor.  Dulcolax (bisacodyl) - Pick up over-the-counter and take as directed by the product packaging as needed to assist with the movement of your bowels.  Take with a full glass of water.  Use this product as needed if not relieved by Colace only.   MiraLax (polyethylene glycol) - Pick up over-the-counter to have on hand.  MiraLax is a solution that will increase the amount of water in your bowels to assist with bowel movements.  Take as directed and can mix with a glass of water, juice, soda, coffee, or tea.  Take if you go more than two days without a movement. Do not use MiraLax more than once per day. Call your doctor if you are still constipated or irregular after using this medication for 7 days in a row.  If you continue to have problems with postoperative constipation, please contact the office for further assistance and recommendations.  If you experience "the worst abdominal pain ever" or develop nausea or vomiting, please contact the office immediatly for further recommendations for  treatment.     Do not sit on low chairs, stoools or toilet seats, as it may be difficult to get up from low surfaces    Complete by:  As directed      Driving restrictions    Complete by:  As directed   No driving until released by the physician.     Increase activity slowly as tolerated    Complete by:  As directed      Lifting restrictions    Complete by:  As directed   No lifting until released by the physician.     Patient may shower    Complete by:  As directed   You may shower without a dressing once there is no drainage.  Do not wash over the wound.  If drainage remains, do not shower until drainage stops.     TED hose    Complete by:  As directed   Use stockings (TED hose) for 3 weeks on both leg(s).  You may remove them at night for sleeping.     Weight bearing as tolerated    Complete by:  As directed   Laterality:  left  Extremity:  Lower            Medication List    TAKE these medications        albuterol 108 (90 Base) MCG/ACT inhaler  Commonly known as:  PROVENTIL HFA;VENTOLIN HFA  Inhale 1-2 puffs into the lungs every 6 (six) hours as needed for wheezing or shortness of breath.     amLODipine 5 MG tablet  Commonly known as:  NORVASC  Take 5 mg by mouth daily.     cetirizine 10 MG tablet  Commonly known as:  ZYRTEC  Take 10 mg by mouth daily.     doxycycline 100 MG capsule  Commonly known as:  VIBRAMYCIN  Take 100 mg by mouth 2 (two) times daily.     Fluticasone-Salmeterol 250-50 MCG/DOSE Aepb  Commonly known as:  ADVAIR  Inhale 1 puff into the lungs 2 (two) times daily.     levothyroxine 88 MCG tablet  Commonly known  as:  SYNTHROID, LEVOTHROID  Take 88 mcg by mouth daily before breakfast.     methocarbamol 500 MG tablet  Commonly known as:  ROBAXIN  Take 1 tablet (500 mg total) by mouth every 6 (six) hours as needed for muscle spasms.     oxyCODONE 5 MG immediate release tablet  Commonly known as:  Oxy IR/ROXICODONE  Take 1-2 tablets (5-10 mg  total) by mouth every 3 (three) hours as needed for moderate pain or severe pain.     rivaroxaban 10 MG Tabs tablet  Commonly known as:  XARELTO  Take 1 tablet (10 mg total) by mouth daily with breakfast. Take Xarelto for two and a half more weeks, then discontinue Xarelto. Once the patient has completed the blood thinner regimen, then take a Baby 81 mg Aspirin daily for three more weeks.     sulfamethoxazole-trimethoprim 400-80 MG tablet  Commonly known as:  BACTRIM,SEPTRA  Take 0.5 tablets by mouth every other day.     traMADol 50 MG tablet  Commonly known as:  ULTRAM  Take 1-2 tablets (50-100 mg total) by mouth every 6 (six) hours as needed (mild pain).           Follow-up Information    Follow up with Gearlean Alf, MD On 09/20/2015.   Specialty:  Orthopedic Surgery   Why:  Call office at [phone] to setup appt on Tuesday 4/11 with Dr. Wynelle Link.   Contact information:   [address] [place] [address] [phone]       Signed: [name], PA-C Orthopaedic Surgery 09/07/2015, 5:46 PM

## 2015-09-07 NOTE — Progress Notes (Signed)
Occupational Therapy Treatment Patient Details Name: Linda Rice MRN: [id number] DOB: 11-18-1940 Today's Date: 09/07/2015    History of present illness 75 yo female s/p L THA-direct anterior 09/05/14.    OT comments  Pt continues to need min guard for safety.  Able to walk to bathroom this am.  She does not feel she needs to practice with AE again  Follow Up Recommendations  SNF    Equipment Recommendations  None recommended by OT    Recommendations for Other Services      Precautions / Restrictions Precautions Precautions: Fall Restrictions Weight Bearing Restrictions: No LLE Weight Bearing: Weight bearing as tolerated       Mobility Bed Mobility               General bed mobility comments: oob  Transfers   Equipment used: Rolling walker (2 wheeled) Transfers: Sit to/from Stand Sit to Stand: Min guard         General transfer comment: close guard for safety.     Balance                                   ADL       Grooming: Oral care;Min guard;Standing                   Toilet Transfer: Min guard;Ambulation;BSC;RW             General ADL Comments: ambulated to bathroom with min guard and performed grooming and toilet transfer.  Pt slightly unsteady but no LOB.  Simulated her high commode using RW and sink to stand. Educated on shower transfer but did not practice this session:  breakfast arrived while we were in the bathroom      Vision                     Perception     Praxis      Cognition   Behavior During Therapy: [facility] for tasks assessed/performed Overall Cognitive Status: Within Functional Limits for tasks assessed                       Extremity/Trunk Assessment               Exercises     Shoulder Instructions       General Comments      Pertinent Vitals/ Pain       Pain Score: 2  Pain Location: L thigh Pain Descriptors / Indicators: Sore Pain Intervention(s):  Limited activity within patient's tolerance;Monitored during session;Premedicated before session;Repositioned;Ice applied  Home Living                                          Prior Functioning/Environment              Frequency       Progress Toward Goals  OT Goals(current goals can now be found in the care plan section)  Progress towards OT goals: Progressing toward goals     Plan      Co-evaluation                 End of Session     Activity Tolerance Patient tolerated treatment well   Patient Left in chair;with call bell/phone within reach;with chair  alarm set;with family/visitor present   Nurse Communication          Time: [phone] OT Time Calculation (min): 20 min  Charges: OT General Charges $OT Visit: 1 Procedure OT Treatments $Self Care/Home Management : 8-22 mins  Austina Constantin 09/07/2015, 10:10 AM  Lesle Chris, OTR/L [phone] 09/07/2015

## 2015-09-07 NOTE — Progress Notes (Signed)
   Subjective: 2 Days Post-Op Procedure(s) (LRB): LEFT TOTAL HIP ARTHROPLASTY ANTERIOR APPROACH (Left) Patient reports pain as mild.   Patient seen in rounds for Dr. Wynelle Link.  Daughter in room. Patient is well, but has had some minor complaints of pain in the hip, requiring pain medications Patient is ready to go home  Objective: Vital signs in last 24 hours: Temp:  [97.7 F (36.5 C)-98.1 F (36.7 C)] 97.7 F (36.5 C) (03/29 1440) Pulse Rate:  [55-62] 57 (03/29 1440) Resp:  [16-18] 18 (03/29 1440) BP: (121-138)/(47-76) 138/76 mmHg (03/29 1440) SpO2:  [94 %-98 %] 98 % (03/29 1440)  Intake/Output from previous day:  Intake/Output Summary (Last 24 hours) at 09/07/15 1742 Last data filed at 09/07/15 1240  Gross per 24 hour  Intake    600 ml  Output    925 ml  Net   -325 ml    Intake/Output this shift: Total I/O In: 480 [P.O.:480] Out: 300 [Urine:300]  Labs:  Recent Labs  09/06/15 0412 09/07/15 0358  HGB 11.7* 11.3*    Recent Labs  09/06/15 0412 09/07/15 0358  WBC 8.5 13.7*  RBC 3.92 3.84*  HCT 34.8* 33.2*  PLT 250 227    Recent Labs  09/06/15 0412 09/07/15 0358  NA 139 140  K 4.5 4.4  CL 106 110  CO2 24 22  BUN 12 17  CREATININE 0.66 0.73  GLUCOSE 163* 130*  CALCIUM 8.2* 8.9   No results for input(s): LABPT, INR in the last 72 hours.  EXAM: General - Patient is Alert, Appropriate and Oriented Extremity - Neurovascular intact Sensation intact distally Dorsiflexion/Plantar flexion intact Incision - clean, dry, no drainage Motor Function - intact, moving foot and toes well on exam.   Assessment/Plan: 2 Days Post-Op Procedure(s) (LRB): LEFT TOTAL HIP ARTHROPLASTY ANTERIOR APPROACH (Left) Procedure(s) (LRB): LEFT TOTAL HIP ARTHROPLASTY ANTERIOR APPROACH (Left) Past Medical History  Diagnosis Date  . Allergy   . Arthritis   . Asthma   . Thyroid disease   . Complication of anesthesia     "I CRY WHEN I WAKE UP"  . Hypertension   .  Osteopenia   . Eczema   . Bladder incontinence     "I HAVE A SLIGHT PROBLEM"  . Alopecia   . History of transfusion     AUTOGOLUS   Principal Problem:   OA (osteoarthritis) of hip  Estimated body mass index is 37.4 kg/(m^2) as calculated from the following:   Height as of this encounter: 5\' 4"  (1.626 m).   Weight as of this encounter: 98.884 kg (218 lb). Discharge home with home health Diet - Cardiac diet Follow up - in 2 weeks Activity - WBAT Disposition - Home Condition Upon Discharge - Good D/C Meds - See DC Summary DVT Prophylaxis - [place], PA-C Orthopaedic Surgery 09/07/2015, 5:42 PM

## 2015-09-09 LAB — TYPE AND SCREEN
ABO/RH(D): A POS
ANTIBODY SCREEN: NEGATIVE
DONOR AG TYPE: NEGATIVE
Unit division: 0

## 2015-09-12 DIAGNOSIS — M6281 Muscle weakness (generalized): Secondary | ICD-10-CM | POA: Diagnosis not present

## 2015-09-12 DIAGNOSIS — Z4789 Encounter for other orthopedic aftercare: Secondary | ICD-10-CM | POA: Diagnosis not present

## 2015-09-12 DIAGNOSIS — R2689 Other abnormalities of gait and mobility: Secondary | ICD-10-CM | POA: Diagnosis not present

## 2015-09-14 DIAGNOSIS — Z4789 Encounter for other orthopedic aftercare: Secondary | ICD-10-CM | POA: Diagnosis not present

## 2015-09-14 DIAGNOSIS — M6281 Muscle weakness (generalized): Secondary | ICD-10-CM | POA: Diagnosis not present

## 2015-09-14 DIAGNOSIS — R2689 Other abnormalities of gait and mobility: Secondary | ICD-10-CM | POA: Diagnosis not present

## 2015-09-16 DIAGNOSIS — R2689 Other abnormalities of gait and mobility: Secondary | ICD-10-CM | POA: Diagnosis not present

## 2015-09-16 DIAGNOSIS — Z4789 Encounter for other orthopedic aftercare: Secondary | ICD-10-CM | POA: Diagnosis not present

## 2015-09-16 DIAGNOSIS — M6281 Muscle weakness (generalized): Secondary | ICD-10-CM | POA: Diagnosis not present

## 2015-09-19 DIAGNOSIS — M6281 Muscle weakness (generalized): Secondary | ICD-10-CM | POA: Diagnosis not present

## 2015-09-19 DIAGNOSIS — R2689 Other abnormalities of gait and mobility: Secondary | ICD-10-CM | POA: Diagnosis not present

## 2015-09-19 DIAGNOSIS — Z4789 Encounter for other orthopedic aftercare: Secondary | ICD-10-CM | POA: Diagnosis not present

## 2015-09-20 DIAGNOSIS — Z471 Aftercare following joint replacement surgery: Secondary | ICD-10-CM | POA: Diagnosis not present

## 2015-09-20 DIAGNOSIS — Z96642 Presence of left artificial hip joint: Secondary | ICD-10-CM | POA: Diagnosis not present

## 2015-09-21 DIAGNOSIS — R2689 Other abnormalities of gait and mobility: Secondary | ICD-10-CM | POA: Diagnosis not present

## 2015-09-21 DIAGNOSIS — Z4789 Encounter for other orthopedic aftercare: Secondary | ICD-10-CM | POA: Diagnosis not present

## 2015-09-21 DIAGNOSIS — M6281 Muscle weakness (generalized): Secondary | ICD-10-CM | POA: Diagnosis not present

## 2015-09-26 DIAGNOSIS — R2689 Other abnormalities of gait and mobility: Secondary | ICD-10-CM | POA: Diagnosis not present

## 2015-09-26 DIAGNOSIS — Z4789 Encounter for other orthopedic aftercare: Secondary | ICD-10-CM | POA: Diagnosis not present

## 2015-09-26 DIAGNOSIS — M6281 Muscle weakness (generalized): Secondary | ICD-10-CM | POA: Diagnosis not present

## 2015-09-28 ENCOUNTER — Ambulatory Visit
Admission: RE | Admit: 2015-09-28 | Discharge: 2015-09-28 | Disposition: A | Discharge status: Home / Self Care | Payer: Medicare Other | Source: Ambulatory Visit

## 2015-09-28 DIAGNOSIS — Z1231 Encounter for screening mammogram for malignant neoplasm of breast: Secondary | ICD-10-CM

## 2015-09-28 DIAGNOSIS — Z4789 Encounter for other orthopedic aftercare: Secondary | ICD-10-CM | POA: Diagnosis not present

## 2015-09-28 DIAGNOSIS — R2689 Other abnormalities of gait and mobility: Secondary | ICD-10-CM | POA: Diagnosis not present

## 2015-09-28 DIAGNOSIS — M6281 Muscle weakness (generalized): Secondary | ICD-10-CM | POA: Diagnosis not present

## 2015-09-30 DIAGNOSIS — Z4789 Encounter for other orthopedic aftercare: Secondary | ICD-10-CM | POA: Diagnosis not present

## 2015-09-30 DIAGNOSIS — R2689 Other abnormalities of gait and mobility: Secondary | ICD-10-CM | POA: Diagnosis not present

## 2015-09-30 DIAGNOSIS — M6281 Muscle weakness (generalized): Secondary | ICD-10-CM | POA: Diagnosis not present

## 2015-10-03 DIAGNOSIS — R2689 Other abnormalities of gait and mobility: Secondary | ICD-10-CM | POA: Diagnosis not present

## 2015-10-03 DIAGNOSIS — Z4789 Encounter for other orthopedic aftercare: Secondary | ICD-10-CM | POA: Diagnosis not present

## 2015-10-03 DIAGNOSIS — M6281 Muscle weakness (generalized): Secondary | ICD-10-CM | POA: Diagnosis not present

## 2015-10-05 DIAGNOSIS — R2689 Other abnormalities of gait and mobility: Secondary | ICD-10-CM | POA: Diagnosis not present

## 2015-10-05 DIAGNOSIS — M6281 Muscle weakness (generalized): Secondary | ICD-10-CM | POA: Diagnosis not present

## 2015-10-05 DIAGNOSIS — Z4789 Encounter for other orthopedic aftercare: Secondary | ICD-10-CM | POA: Diagnosis not present

## 2015-10-07 DIAGNOSIS — Z4789 Encounter for other orthopedic aftercare: Secondary | ICD-10-CM | POA: Diagnosis not present

## 2015-10-07 DIAGNOSIS — R2689 Other abnormalities of gait and mobility: Secondary | ICD-10-CM | POA: Diagnosis not present

## 2015-10-07 DIAGNOSIS — M6281 Muscle weakness (generalized): Secondary | ICD-10-CM | POA: Diagnosis not present

## 2015-10-11 DIAGNOSIS — Z471 Aftercare following joint replacement surgery: Secondary | ICD-10-CM | POA: Diagnosis not present

## 2015-10-11 DIAGNOSIS — Z96642 Presence of left artificial hip joint: Secondary | ICD-10-CM | POA: Diagnosis not present

## 2015-10-12 DIAGNOSIS — M6281 Muscle weakness (generalized): Secondary | ICD-10-CM | POA: Diagnosis not present

## 2015-10-12 DIAGNOSIS — R2689 Other abnormalities of gait and mobility: Secondary | ICD-10-CM | POA: Diagnosis not present

## 2015-10-14 DIAGNOSIS — M6281 Muscle weakness (generalized): Secondary | ICD-10-CM | POA: Diagnosis not present

## 2015-10-14 DIAGNOSIS — R2689 Other abnormalities of gait and mobility: Secondary | ICD-10-CM | POA: Diagnosis not present

## 2015-10-19 DIAGNOSIS — R2689 Other abnormalities of gait and mobility: Secondary | ICD-10-CM | POA: Diagnosis not present

## 2015-10-19 DIAGNOSIS — M6281 Muscle weakness (generalized): Secondary | ICD-10-CM | POA: Diagnosis not present

## 2015-10-21 DIAGNOSIS — R2689 Other abnormalities of gait and mobility: Secondary | ICD-10-CM | POA: Diagnosis not present

## 2015-10-21 DIAGNOSIS — M6281 Muscle weakness (generalized): Secondary | ICD-10-CM | POA: Diagnosis not present

## 2015-11-22 DIAGNOSIS — Z471 Aftercare following joint replacement surgery: Secondary | ICD-10-CM | POA: Diagnosis not present

## 2015-11-22 DIAGNOSIS — Z96642 Presence of left artificial hip joint: Secondary | ICD-10-CM | POA: Diagnosis not present

## 2015-12-01 DIAGNOSIS — E7800 Pure hypercholesterolemia, unspecified: Secondary | ICD-10-CM | POA: Diagnosis not present

## 2015-12-01 DIAGNOSIS — I10 Essential (primary) hypertension: Secondary | ICD-10-CM | POA: Diagnosis not present

## 2015-12-01 DIAGNOSIS — Z6836 Body mass index (BMI) 36.0-36.9, adult: Secondary | ICD-10-CM | POA: Diagnosis not present

## 2015-12-01 DIAGNOSIS — E039 Hypothyroidism, unspecified: Secondary | ICD-10-CM | POA: Diagnosis not present

## 2015-12-01 DIAGNOSIS — E669 Obesity, unspecified: Secondary | ICD-10-CM | POA: Diagnosis not present

## 2015-12-01 DIAGNOSIS — Z1389 Encounter for screening for other disorder: Secondary | ICD-10-CM | POA: Diagnosis not present

## 2015-12-01 DIAGNOSIS — Z0000 Encounter for general adult medical examination without abnormal findings: Secondary | ICD-10-CM | POA: Diagnosis not present

## 2015-12-16 DIAGNOSIS — Z87898 Personal history of other specified conditions: Secondary | ICD-10-CM | POA: Diagnosis not present

## 2015-12-16 DIAGNOSIS — Z124 Encounter for screening for malignant neoplasm of cervix: Secondary | ICD-10-CM | POA: Diagnosis not present

## 2015-12-16 DIAGNOSIS — R32 Unspecified urinary incontinence: Secondary | ICD-10-CM | POA: Diagnosis not present

## 2016-02-14 DIAGNOSIS — N3946 Mixed incontinence: Secondary | ICD-10-CM | POA: Diagnosis not present

## 2016-02-14 DIAGNOSIS — R32 Unspecified urinary incontinence: Secondary | ICD-10-CM | POA: Diagnosis not present

## 2016-02-16 DIAGNOSIS — E669 Obesity, unspecified: Secondary | ICD-10-CM | POA: Diagnosis not present

## 2016-02-16 DIAGNOSIS — R609 Edema, unspecified: Secondary | ICD-10-CM | POA: Diagnosis not present

## 2016-02-16 DIAGNOSIS — E039 Hypothyroidism, unspecified: Secondary | ICD-10-CM | POA: Diagnosis not present

## 2016-02-16 DIAGNOSIS — N393 Stress incontinence (female) (male): Secondary | ICD-10-CM | POA: Diagnosis not present

## 2016-02-16 DIAGNOSIS — I10 Essential (primary) hypertension: Secondary | ICD-10-CM | POA: Diagnosis not present

## 2016-02-16 DIAGNOSIS — N390 Urinary tract infection, site not specified: Secondary | ICD-10-CM | POA: Diagnosis not present

## 2016-02-16 DIAGNOSIS — Z23 Encounter for immunization: Secondary | ICD-10-CM | POA: Diagnosis not present

## 2016-02-16 DIAGNOSIS — Z6836 Body mass index (BMI) 36.0-36.9, adult: Secondary | ICD-10-CM | POA: Diagnosis not present

## 2016-03-08 DIAGNOSIS — R32 Unspecified urinary incontinence: Secondary | ICD-10-CM | POA: Diagnosis not present

## 2016-03-21 DIAGNOSIS — N390 Urinary tract infection, site not specified: Secondary | ICD-10-CM | POA: Diagnosis not present

## 2016-03-26 DIAGNOSIS — E039 Hypothyroidism, unspecified: Secondary | ICD-10-CM | POA: Diagnosis not present

## 2016-03-26 DIAGNOSIS — N3000 Acute cystitis without hematuria: Secondary | ICD-10-CM | POA: Diagnosis not present

## 2016-03-26 DIAGNOSIS — N3946 Mixed incontinence: Secondary | ICD-10-CM | POA: Diagnosis not present

## 2016-05-09 DIAGNOSIS — H2513 Age-related nuclear cataract, bilateral: Secondary | ICD-10-CM | POA: Diagnosis not present

## 2016-05-09 DIAGNOSIS — H1031 Unspecified acute conjunctivitis, right eye: Secondary | ICD-10-CM | POA: Diagnosis not present

## 2016-05-15 ENCOUNTER — Telehealth: Payer: Self-pay | Admitting: *Deleted

## 2016-05-15 DIAGNOSIS — R32 Unspecified urinary incontinence: Secondary | ICD-10-CM | POA: Diagnosis not present

## 2016-05-15 DIAGNOSIS — N3000 Acute cystitis without hematuria: Secondary | ICD-10-CM | POA: Diagnosis not present

## 2016-05-15 NOTE — Telephone Encounter (Signed)
Dr [name] at [facility] called, asked for Dr Johnnye Sima to call him back [phone] (cell) regarding mutual patient.  Patient states "Dr Johnnye Sima is very familiar with my case" but was last seen 02/05/2008.  Patient is still on doxycycline 100mg  twice daily for cellulitis (no idea who is refilling this).   Landis Gandy, RN

## 2016-05-21 DIAGNOSIS — R319 Hematuria, unspecified: Secondary | ICD-10-CM | POA: Diagnosis not present

## 2016-05-24 DIAGNOSIS — N3000 Acute cystitis without hematuria: Secondary | ICD-10-CM | POA: Diagnosis not present

## 2016-05-24 DIAGNOSIS — K5730 Diverticulosis of large intestine without perforation or abscess without bleeding: Secondary | ICD-10-CM | POA: Diagnosis not present

## 2016-05-28 NOTE — Telephone Encounter (Signed)
i'd be glad to see if she wants an appt  Thanks jeff

## 2016-06-18 ENCOUNTER — Encounter: Payer: Self-pay | Admitting: Infectious Diseases

## 2016-06-18 ENCOUNTER — Ambulatory Visit (INDEPENDENT_AMBULATORY_CARE_PROVIDER_SITE_OTHER): Payer: Medicare Other | Admitting: Infectious Diseases

## 2016-06-18 DIAGNOSIS — T8453XS Infection and inflammatory reaction due to internal right knee prosthesis, sequela: Secondary | ICD-10-CM

## 2016-06-18 DIAGNOSIS — R8271 Bacteriuria: Secondary | ICD-10-CM | POA: Insufficient documentation

## 2016-06-18 DIAGNOSIS — R8281 Pyuria: Secondary | ICD-10-CM

## 2016-06-18 DIAGNOSIS — N390 Urinary tract infection, site not specified: Secondary | ICD-10-CM

## 2016-06-18 NOTE — Assessment & Plan Note (Addendum)
She has been doing well since her last visit in 2009,  She is very hesitant about stopping her anbx. I understand the concerns about difficulty with urine bacteria developing resistance while she is on chronic anbx. My concern is that she may not have true infection but may instead have colonization due to poor urodynamics.  She asks if her osteo is gone, I explained that there is no good way to know this without bone bx's or further scans. She is not interested in this at this time.   She wishes to stay on the anbx, I am fine with this.  She will need f/u of her urine sx.

## 2016-06-18 NOTE — Assessment & Plan Note (Signed)
Would only treat her if she has sx.  Would not check serial Cx's or test for cure after treatment.  Appreciate eval by urology.

## 2016-06-18 NOTE — Progress Notes (Signed)
   Subjective:    Patient ID: Linda Rice, female    DOB: 1941/05/01, 76 y.o.   MRN: [id number]  HPI 76 yo F with hx of R TKR 7-01 whic became infeted (? organism). This prosthesis was resected and she recieved IV antibiotics. SHe had a re-implantation 11-02. she did well until 4-04 when she developed eczema which gave rise to cellulitis. She had an I & D of her knee at that time which grew Group B strep. she was treated with ceftriaxone and rifampin and on 5-04 she underwent exchange of parts. She contunued on IV therapy however returned 6-04 with an allergice rxn on the skin overlying her knee due to a topical which she was using. After completing her IV therapy she was transitioned to azithro and rifampin until her insurance company balked and she was then changed to doxy and rifampin.  She was admitted 01-13-08 with worsening R knee pain and was felt to have degeneration of her components. Cx of fluid were negative for AFB, fungus and routine. 19,845 WBC (90% N), ESR 14.8, ESR 80. she was not treated with IV anbx after the cx returned as negative.  she was returned to her previous by mouth regimen.   Her last visit with ID was 2009. She has been on her suppressive doxy since that time. She is very worried about stopping this.  Has not had any issues with her knees for some time. Her last visit was ortho was told that they were doing well.  She had L THR 08-2015. Has been doing well with this, "easy recovery". Has also been eval by Gyn for urinary incont. Has had issue with +UCx as well, now on allura (improved urgency).   Review of Systems  Constitutional: Negative for appetite change, chills, fever and unexpected weight change.  Genitourinary: Negative for difficulty urinating and frequency.  Musculoskeletal: Negative for arthralgias.       Objective:   Physical Exam  Constitutional: She appears well-developed and well-nourished.  HENT:  Mouth/Throat: No oropharyngeal exudate.    Eyes: EOM are normal. Pupils are equal, round, and reactive to light.  Neck: Neck supple.  Cardiovascular:  Murmur heard. irregular  Pulmonary/Chest: Effort normal and breath sounds normal.  Abdominal: Soft. Bowel sounds are normal. There is no tenderness. There is no rebound.  Musculoskeletal:       Legs: Lymphadenopathy:    She has no cervical adenopathy.      Assessment & Plan:

## 2016-06-19 DIAGNOSIS — N3941 Urge incontinence: Secondary | ICD-10-CM | POA: Diagnosis not present

## 2016-06-19 DIAGNOSIS — R32 Unspecified urinary incontinence: Secondary | ICD-10-CM | POA: Diagnosis not present

## 2016-06-29 DIAGNOSIS — N3020 Other chronic cystitis without hematuria: Secondary | ICD-10-CM | POA: Diagnosis not present

## 2016-06-29 DIAGNOSIS — N323 Diverticulum of bladder: Secondary | ICD-10-CM | POA: Diagnosis not present

## 2016-06-29 DIAGNOSIS — R32 Unspecified urinary incontinence: Secondary | ICD-10-CM | POA: Diagnosis not present

## 2016-07-04 DIAGNOSIS — J4520 Mild intermittent asthma, uncomplicated: Secondary | ICD-10-CM | POA: Diagnosis not present

## 2016-07-04 DIAGNOSIS — I10 Essential (primary) hypertension: Secondary | ICD-10-CM | POA: Diagnosis not present

## 2016-07-04 DIAGNOSIS — N3946 Mixed incontinence: Secondary | ICD-10-CM | POA: Diagnosis not present

## 2016-09-27 DIAGNOSIS — M1611 Unilateral primary osteoarthritis, right hip: Secondary | ICD-10-CM | POA: Diagnosis not present

## 2016-09-27 DIAGNOSIS — Z471 Aftercare following joint replacement surgery: Secondary | ICD-10-CM | POA: Diagnosis not present

## 2016-09-27 DIAGNOSIS — Z96642 Presence of left artificial hip joint: Secondary | ICD-10-CM | POA: Diagnosis not present

## 2016-10-19 DIAGNOSIS — H2513 Age-related nuclear cataract, bilateral: Secondary | ICD-10-CM | POA: Diagnosis not present

## 2016-10-19 DIAGNOSIS — H1031 Unspecified acute conjunctivitis, right eye: Secondary | ICD-10-CM | POA: Diagnosis not present

## 2016-12-05 DIAGNOSIS — M8580 Other specified disorders of bone density and structure, unspecified site: Secondary | ICD-10-CM | POA: Diagnosis not present

## 2016-12-05 DIAGNOSIS — E7800 Pure hypercholesterolemia, unspecified: Secondary | ICD-10-CM | POA: Diagnosis not present

## 2016-12-05 DIAGNOSIS — E669 Obesity, unspecified: Secondary | ICD-10-CM | POA: Diagnosis not present

## 2016-12-05 DIAGNOSIS — Z6839 Body mass index (BMI) 39.0-39.9, adult: Secondary | ICD-10-CM | POA: Diagnosis not present

## 2016-12-05 DIAGNOSIS — Z0000 Encounter for general adult medical examination without abnormal findings: Secondary | ICD-10-CM | POA: Diagnosis not present

## 2016-12-05 DIAGNOSIS — I10 Essential (primary) hypertension: Secondary | ICD-10-CM | POA: Diagnosis not present

## 2016-12-05 DIAGNOSIS — E039 Hypothyroidism, unspecified: Secondary | ICD-10-CM | POA: Diagnosis not present

## 2016-12-05 DIAGNOSIS — D126 Benign neoplasm of colon, unspecified: Secondary | ICD-10-CM | POA: Diagnosis not present

## 2016-12-05 DIAGNOSIS — Z1389 Encounter for screening for other disorder: Secondary | ICD-10-CM | POA: Diagnosis not present

## 2016-12-05 DIAGNOSIS — N3946 Mixed incontinence: Secondary | ICD-10-CM | POA: Diagnosis not present

## 2016-12-11 ENCOUNTER — Other Ambulatory Visit: Payer: Self-pay | Admitting: Internal Medicine

## 2016-12-11 ENCOUNTER — Ambulatory Visit
Admission: RE | Admit: 2016-12-11 | Discharge: 2016-12-11 | Disposition: A | Discharge status: Home / Self Care | Payer: Medicare Other | Source: Ambulatory Visit | Attending: Internal Medicine | Admitting: Internal Medicine

## 2016-12-11 DIAGNOSIS — Z1231 Encounter for screening mammogram for malignant neoplasm of breast: Secondary | ICD-10-CM

## 2017-01-09 DIAGNOSIS — M79674 Pain in right toe(s): Secondary | ICD-10-CM | POA: Diagnosis not present

## 2017-01-21 DIAGNOSIS — M8588 Other specified disorders of bone density and structure, other site: Secondary | ICD-10-CM | POA: Diagnosis not present

## 2017-03-11 DIAGNOSIS — Z23 Encounter for immunization: Secondary | ICD-10-CM | POA: Diagnosis not present

## 2017-04-23 DIAGNOSIS — I10 Essential (primary) hypertension: Secondary | ICD-10-CM | POA: Diagnosis not present

## 2017-04-23 DIAGNOSIS — J4520 Mild intermittent asthma, uncomplicated: Secondary | ICD-10-CM | POA: Diagnosis not present

## 2017-04-23 DIAGNOSIS — M179 Osteoarthritis of knee, unspecified: Secondary | ICD-10-CM | POA: Diagnosis not present

## 2017-04-23 DIAGNOSIS — F329 Major depressive disorder, single episode, unspecified: Secondary | ICD-10-CM | POA: Diagnosis not present

## 2017-04-23 DIAGNOSIS — E039 Hypothyroidism, unspecified: Secondary | ICD-10-CM | POA: Diagnosis not present

## 2017-05-08 DIAGNOSIS — Z79899 Other long term (current) drug therapy: Secondary | ICD-10-CM | POA: Diagnosis not present

## 2017-05-08 DIAGNOSIS — N323 Diverticulum of bladder: Secondary | ICD-10-CM | POA: Diagnosis not present

## 2017-05-08 DIAGNOSIS — Z7982 Long term (current) use of aspirin: Secondary | ICD-10-CM | POA: Diagnosis not present

## 2017-05-08 DIAGNOSIS — R32 Unspecified urinary incontinence: Secondary | ICD-10-CM | POA: Diagnosis not present

## 2017-05-08 DIAGNOSIS — M868X9 Other osteomyelitis, unspecified sites: Secondary | ICD-10-CM | POA: Diagnosis not present

## 2017-05-08 DIAGNOSIS — N390 Urinary tract infection, site not specified: Secondary | ICD-10-CM | POA: Diagnosis not present

## 2017-05-17 DIAGNOSIS — I499 Cardiac arrhythmia, unspecified: Secondary | ICD-10-CM | POA: Diagnosis not present

## 2017-05-17 DIAGNOSIS — I10 Essential (primary) hypertension: Secondary | ICD-10-CM | POA: Diagnosis not present

## 2017-05-17 DIAGNOSIS — E7800 Pure hypercholesterolemia, unspecified: Secondary | ICD-10-CM | POA: Diagnosis not present

## 2017-05-17 DIAGNOSIS — N3946 Mixed incontinence: Secondary | ICD-10-CM | POA: Diagnosis not present

## 2017-05-17 DIAGNOSIS — M8580 Other specified disorders of bone density and structure, unspecified site: Secondary | ICD-10-CM | POA: Diagnosis not present

## 2017-05-17 DIAGNOSIS — I491 Atrial premature depolarization: Secondary | ICD-10-CM | POA: Diagnosis not present

## 2017-06-05 DIAGNOSIS — N390 Urinary tract infection, site not specified: Secondary | ICD-10-CM | POA: Diagnosis not present

## 2017-06-24 DIAGNOSIS — N321 Vesicointestinal fistula: Secondary | ICD-10-CM | POA: Diagnosis not present

## 2017-06-24 DIAGNOSIS — N33 Bladder disorders in diseases classified elsewhere: Secondary | ICD-10-CM | POA: Diagnosis not present

## 2017-06-24 DIAGNOSIS — R319 Hematuria, unspecified: Secondary | ICD-10-CM | POA: Diagnosis not present

## 2017-06-24 DIAGNOSIS — R32 Unspecified urinary incontinence: Secondary | ICD-10-CM | POA: Diagnosis not present

## 2017-06-24 DIAGNOSIS — N393 Stress incontinence (female) (male): Secondary | ICD-10-CM | POA: Diagnosis not present

## 2017-06-24 DIAGNOSIS — N3946 Mixed incontinence: Secondary | ICD-10-CM | POA: Diagnosis not present

## 2017-06-24 DIAGNOSIS — N390 Urinary tract infection, site not specified: Secondary | ICD-10-CM | POA: Diagnosis not present

## 2017-07-22 DIAGNOSIS — N390 Urinary tract infection, site not specified: Secondary | ICD-10-CM | POA: Diagnosis not present

## 2017-07-22 DIAGNOSIS — R350 Frequency of micturition: Secondary | ICD-10-CM | POA: Diagnosis not present

## 2017-07-22 DIAGNOSIS — R32 Unspecified urinary incontinence: Secondary | ICD-10-CM | POA: Diagnosis not present

## 2017-07-22 DIAGNOSIS — N322 Vesical fistula, not elsewhere classified: Secondary | ICD-10-CM | POA: Diagnosis not present

## 2017-07-24 DIAGNOSIS — Z96642 Presence of left artificial hip joint: Secondary | ICD-10-CM | POA: Diagnosis not present

## 2017-07-24 DIAGNOSIS — M7541 Impingement syndrome of right shoulder: Secondary | ICD-10-CM | POA: Diagnosis not present

## 2017-07-24 DIAGNOSIS — M25511 Pain in right shoulder: Secondary | ICD-10-CM | POA: Diagnosis not present

## 2017-07-24 DIAGNOSIS — M1611 Unilateral primary osteoarthritis, right hip: Secondary | ICD-10-CM | POA: Diagnosis not present

## 2017-08-08 DIAGNOSIS — M6281 Muscle weakness (generalized): Secondary | ICD-10-CM | POA: Diagnosis not present

## 2017-08-08 DIAGNOSIS — M25511 Pain in right shoulder: Secondary | ICD-10-CM | POA: Diagnosis not present

## 2017-08-08 DIAGNOSIS — R2689 Other abnormalities of gait and mobility: Secondary | ICD-10-CM | POA: Diagnosis not present

## 2017-08-09 DIAGNOSIS — M25511 Pain in right shoulder: Secondary | ICD-10-CM | POA: Diagnosis not present

## 2017-08-09 DIAGNOSIS — R2689 Other abnormalities of gait and mobility: Secondary | ICD-10-CM | POA: Diagnosis not present

## 2017-08-09 DIAGNOSIS — M6281 Muscle weakness (generalized): Secondary | ICD-10-CM | POA: Diagnosis not present

## 2017-08-13 DIAGNOSIS — M6281 Muscle weakness (generalized): Secondary | ICD-10-CM | POA: Diagnosis not present

## 2017-08-13 DIAGNOSIS — M25511 Pain in right shoulder: Secondary | ICD-10-CM | POA: Diagnosis not present

## 2017-08-13 DIAGNOSIS — R2689 Other abnormalities of gait and mobility: Secondary | ICD-10-CM | POA: Diagnosis not present

## 2017-08-14 DIAGNOSIS — N3946 Mixed incontinence: Secondary | ICD-10-CM | POA: Diagnosis not present

## 2017-08-14 DIAGNOSIS — R9389 Abnormal findings on diagnostic imaging of other specified body structures: Secondary | ICD-10-CM | POA: Diagnosis not present

## 2017-08-14 DIAGNOSIS — N390 Urinary tract infection, site not specified: Secondary | ICD-10-CM | POA: Diagnosis not present

## 2017-08-14 DIAGNOSIS — N321 Vesicointestinal fistula: Secondary | ICD-10-CM | POA: Diagnosis not present

## 2017-08-14 DIAGNOSIS — N33 Bladder disorders in diseases classified elsewhere: Secondary | ICD-10-CM | POA: Diagnosis not present

## 2017-08-14 DIAGNOSIS — N329 Bladder disorder, unspecified: Secondary | ICD-10-CM | POA: Diagnosis not present

## 2017-08-15 DIAGNOSIS — M6281 Muscle weakness (generalized): Secondary | ICD-10-CM | POA: Diagnosis not present

## 2017-08-15 DIAGNOSIS — R2689 Other abnormalities of gait and mobility: Secondary | ICD-10-CM | POA: Diagnosis not present

## 2017-08-15 DIAGNOSIS — M25511 Pain in right shoulder: Secondary | ICD-10-CM | POA: Diagnosis not present

## 2017-08-19 DIAGNOSIS — M6281 Muscle weakness (generalized): Secondary | ICD-10-CM | POA: Diagnosis not present

## 2017-08-19 DIAGNOSIS — M25511 Pain in right shoulder: Secondary | ICD-10-CM | POA: Diagnosis not present

## 2017-08-19 DIAGNOSIS — R2689 Other abnormalities of gait and mobility: Secondary | ICD-10-CM | POA: Diagnosis not present

## 2017-08-22 DIAGNOSIS — R2689 Other abnormalities of gait and mobility: Secondary | ICD-10-CM | POA: Diagnosis not present

## 2017-08-22 DIAGNOSIS — M6281 Muscle weakness (generalized): Secondary | ICD-10-CM | POA: Diagnosis not present

## 2017-08-22 DIAGNOSIS — M25511 Pain in right shoulder: Secondary | ICD-10-CM | POA: Diagnosis not present

## 2017-08-27 DIAGNOSIS — M25511 Pain in right shoulder: Secondary | ICD-10-CM | POA: Diagnosis not present

## 2017-08-27 DIAGNOSIS — R2689 Other abnormalities of gait and mobility: Secondary | ICD-10-CM | POA: Diagnosis not present

## 2017-08-27 DIAGNOSIS — M6281 Muscle weakness (generalized): Secondary | ICD-10-CM | POA: Diagnosis not present

## 2017-08-29 DIAGNOSIS — R2689 Other abnormalities of gait and mobility: Secondary | ICD-10-CM | POA: Diagnosis not present

## 2017-08-29 DIAGNOSIS — M25511 Pain in right shoulder: Secondary | ICD-10-CM | POA: Diagnosis not present

## 2017-08-29 DIAGNOSIS — M6281 Muscle weakness (generalized): Secondary | ICD-10-CM | POA: Diagnosis not present

## 2017-09-03 DIAGNOSIS — M25511 Pain in right shoulder: Secondary | ICD-10-CM | POA: Diagnosis not present

## 2017-09-03 DIAGNOSIS — R2689 Other abnormalities of gait and mobility: Secondary | ICD-10-CM | POA: Diagnosis not present

## 2017-09-03 DIAGNOSIS — M6281 Muscle weakness (generalized): Secondary | ICD-10-CM | POA: Diagnosis not present

## 2017-09-04 DIAGNOSIS — M25511 Pain in right shoulder: Secondary | ICD-10-CM | POA: Diagnosis not present

## 2017-09-04 DIAGNOSIS — E039 Hypothyroidism, unspecified: Secondary | ICD-10-CM | POA: Diagnosis not present

## 2017-09-04 DIAGNOSIS — M179 Osteoarthritis of knee, unspecified: Secondary | ICD-10-CM | POA: Diagnosis not present

## 2017-09-04 DIAGNOSIS — F329 Major depressive disorder, single episode, unspecified: Secondary | ICD-10-CM | POA: Diagnosis not present

## 2017-09-04 DIAGNOSIS — J4520 Mild intermittent asthma, uncomplicated: Secondary | ICD-10-CM | POA: Diagnosis not present

## 2017-09-04 DIAGNOSIS — I10 Essential (primary) hypertension: Secondary | ICD-10-CM | POA: Diagnosis not present

## 2017-09-05 DIAGNOSIS — R2689 Other abnormalities of gait and mobility: Secondary | ICD-10-CM | POA: Diagnosis not present

## 2017-09-05 DIAGNOSIS — M6281 Muscle weakness (generalized): Secondary | ICD-10-CM | POA: Diagnosis not present

## 2017-09-05 DIAGNOSIS — M25511 Pain in right shoulder: Secondary | ICD-10-CM | POA: Diagnosis not present

## 2017-09-10 DIAGNOSIS — R2689 Other abnormalities of gait and mobility: Secondary | ICD-10-CM | POA: Diagnosis not present

## 2017-09-10 DIAGNOSIS — M6281 Muscle weakness (generalized): Secondary | ICD-10-CM | POA: Diagnosis not present

## 2017-09-10 DIAGNOSIS — M25511 Pain in right shoulder: Secondary | ICD-10-CM | POA: Diagnosis not present

## 2017-09-12 DIAGNOSIS — M6281 Muscle weakness (generalized): Secondary | ICD-10-CM | POA: Diagnosis not present

## 2017-09-12 DIAGNOSIS — M25511 Pain in right shoulder: Secondary | ICD-10-CM | POA: Diagnosis not present

## 2017-09-12 DIAGNOSIS — R2689 Other abnormalities of gait and mobility: Secondary | ICD-10-CM | POA: Diagnosis not present

## 2017-09-17 DIAGNOSIS — M6281 Muscle weakness (generalized): Secondary | ICD-10-CM | POA: Diagnosis not present

## 2017-09-17 DIAGNOSIS — M25511 Pain in right shoulder: Secondary | ICD-10-CM | POA: Diagnosis not present

## 2017-09-17 DIAGNOSIS — R2689 Other abnormalities of gait and mobility: Secondary | ICD-10-CM | POA: Diagnosis not present

## 2017-09-19 DIAGNOSIS — M6281 Muscle weakness (generalized): Secondary | ICD-10-CM | POA: Diagnosis not present

## 2017-09-19 DIAGNOSIS — M25511 Pain in right shoulder: Secondary | ICD-10-CM | POA: Diagnosis not present

## 2017-09-19 DIAGNOSIS — R2689 Other abnormalities of gait and mobility: Secondary | ICD-10-CM | POA: Diagnosis not present

## 2017-09-20 DIAGNOSIS — N329 Bladder disorder, unspecified: Secondary | ICD-10-CM | POA: Diagnosis not present

## 2017-09-20 DIAGNOSIS — R339 Retention of urine, unspecified: Secondary | ICD-10-CM | POA: Diagnosis not present

## 2017-09-20 DIAGNOSIS — N3090 Cystitis, unspecified without hematuria: Secondary | ICD-10-CM | POA: Diagnosis not present

## 2017-09-20 DIAGNOSIS — N323 Diverticulum of bladder: Secondary | ICD-10-CM | POA: Diagnosis not present

## 2017-09-20 DIAGNOSIS — N3030 Trigonitis without hematuria: Secondary | ICD-10-CM | POA: Diagnosis not present

## 2017-09-20 DIAGNOSIS — N3289 Other specified disorders of bladder: Secondary | ICD-10-CM | POA: Diagnosis not present

## 2017-09-20 DIAGNOSIS — N393 Stress incontinence (female) (male): Secondary | ICD-10-CM | POA: Diagnosis not present

## 2017-09-24 DIAGNOSIS — M25511 Pain in right shoulder: Secondary | ICD-10-CM | POA: Diagnosis not present

## 2017-09-24 DIAGNOSIS — M6281 Muscle weakness (generalized): Secondary | ICD-10-CM | POA: Diagnosis not present

## 2017-09-24 DIAGNOSIS — R2689 Other abnormalities of gait and mobility: Secondary | ICD-10-CM | POA: Diagnosis not present

## 2017-09-26 DIAGNOSIS — M6281 Muscle weakness (generalized): Secondary | ICD-10-CM | POA: Diagnosis not present

## 2017-09-26 DIAGNOSIS — M25511 Pain in right shoulder: Secondary | ICD-10-CM | POA: Diagnosis not present

## 2017-09-26 DIAGNOSIS — R2689 Other abnormalities of gait and mobility: Secondary | ICD-10-CM | POA: Diagnosis not present

## 2017-10-03 DIAGNOSIS — M6281 Muscle weakness (generalized): Secondary | ICD-10-CM | POA: Diagnosis not present

## 2017-10-03 DIAGNOSIS — R2689 Other abnormalities of gait and mobility: Secondary | ICD-10-CM | POA: Diagnosis not present

## 2017-10-03 DIAGNOSIS — M25511 Pain in right shoulder: Secondary | ICD-10-CM | POA: Diagnosis not present

## 2017-10-04 DIAGNOSIS — M25511 Pain in right shoulder: Secondary | ICD-10-CM | POA: Diagnosis not present

## 2017-10-04 DIAGNOSIS — M7541 Impingement syndrome of right shoulder: Secondary | ICD-10-CM | POA: Insufficient documentation

## 2017-10-21 DIAGNOSIS — N3030 Trigonitis without hematuria: Secondary | ICD-10-CM | POA: Diagnosis not present

## 2017-10-21 DIAGNOSIS — N390 Urinary tract infection, site not specified: Secondary | ICD-10-CM | POA: Diagnosis not present

## 2017-11-19 DIAGNOSIS — H2513 Age-related nuclear cataract, bilateral: Secondary | ICD-10-CM | POA: Diagnosis not present

## 2017-12-09 DIAGNOSIS — Z1211 Encounter for screening for malignant neoplasm of colon: Secondary | ICD-10-CM | POA: Diagnosis not present

## 2017-12-09 DIAGNOSIS — J4520 Mild intermittent asthma, uncomplicated: Secondary | ICD-10-CM | POA: Diagnosis not present

## 2017-12-09 DIAGNOSIS — Z6838 Body mass index (BMI) 38.0-38.9, adult: Secondary | ICD-10-CM | POA: Diagnosis not present

## 2017-12-09 DIAGNOSIS — E039 Hypothyroidism, unspecified: Secondary | ICD-10-CM | POA: Diagnosis not present

## 2017-12-09 DIAGNOSIS — I10 Essential (primary) hypertension: Secondary | ICD-10-CM | POA: Diagnosis not present

## 2017-12-09 DIAGNOSIS — Z1389 Encounter for screening for other disorder: Secondary | ICD-10-CM | POA: Diagnosis not present

## 2017-12-09 DIAGNOSIS — N3030 Trigonitis without hematuria: Secondary | ICD-10-CM | POA: Diagnosis not present

## 2017-12-09 DIAGNOSIS — Z0000 Encounter for general adult medical examination without abnormal findings: Secondary | ICD-10-CM | POA: Diagnosis not present

## 2017-12-09 DIAGNOSIS — F329 Major depressive disorder, single episode, unspecified: Secondary | ICD-10-CM | POA: Diagnosis not present

## 2017-12-09 DIAGNOSIS — E7800 Pure hypercholesterolemia, unspecified: Secondary | ICD-10-CM | POA: Diagnosis not present

## 2017-12-11 DIAGNOSIS — N390 Urinary tract infection, site not specified: Secondary | ICD-10-CM | POA: Diagnosis not present

## 2017-12-11 DIAGNOSIS — R32 Unspecified urinary incontinence: Secondary | ICD-10-CM | POA: Diagnosis not present

## 2017-12-11 DIAGNOSIS — N3030 Trigonitis without hematuria: Secondary | ICD-10-CM | POA: Diagnosis not present

## 2017-12-11 DIAGNOSIS — R351 Nocturia: Secondary | ICD-10-CM | POA: Diagnosis not present

## 2017-12-11 DIAGNOSIS — N3946 Mixed incontinence: Secondary | ICD-10-CM | POA: Diagnosis not present

## 2017-12-13 ENCOUNTER — Other Ambulatory Visit: Payer: Self-pay | Admitting: Internal Medicine

## 2017-12-13 DIAGNOSIS — Z1231 Encounter for screening mammogram for malignant neoplasm of breast: Secondary | ICD-10-CM

## 2018-01-14 ENCOUNTER — Ambulatory Visit
Admission: RE | Admit: 2018-01-14 | Discharge: 2018-01-14 | Disposition: A | Discharge status: Home / Self Care | Payer: Medicare Other | Source: Ambulatory Visit | Attending: Internal Medicine | Admitting: Internal Medicine

## 2018-01-14 DIAGNOSIS — Z1231 Encounter for screening mammogram for malignant neoplasm of breast: Secondary | ICD-10-CM

## 2018-02-21 DIAGNOSIS — Z23 Encounter for immunization: Secondary | ICD-10-CM | POA: Diagnosis not present

## 2018-03-09 DIAGNOSIS — H02401 Unspecified ptosis of right eyelid: Secondary | ICD-10-CM | POA: Diagnosis not present

## 2018-03-14 DIAGNOSIS — H02831 Dermatochalasis of right upper eyelid: Secondary | ICD-10-CM | POA: Diagnosis not present

## 2018-03-24 DIAGNOSIS — S0003XA Contusion of scalp, initial encounter: Secondary | ICD-10-CM | POA: Diagnosis not present

## 2018-03-24 DIAGNOSIS — S40022A Contusion of left upper arm, initial encounter: Secondary | ICD-10-CM | POA: Diagnosis not present

## 2018-04-21 DIAGNOSIS — H02413 Mechanical ptosis of bilateral eyelids: Secondary | ICD-10-CM | POA: Diagnosis not present

## 2018-04-21 DIAGNOSIS — L630 Alopecia (capitis) totalis: Secondary | ICD-10-CM | POA: Diagnosis not present

## 2018-04-21 DIAGNOSIS — H04552 Acquired stenosis of left nasolacrimal duct: Secondary | ICD-10-CM | POA: Diagnosis not present

## 2018-04-21 DIAGNOSIS — H04222 Epiphora due to insufficient drainage, left lacrimal gland: Secondary | ICD-10-CM | POA: Diagnosis not present

## 2018-04-21 DIAGNOSIS — L659 Nonscarring hair loss, unspecified: Secondary | ICD-10-CM | POA: Insufficient documentation

## 2018-04-21 DIAGNOSIS — I10 Essential (primary) hypertension: Secondary | ICD-10-CM | POA: Insufficient documentation

## 2018-04-21 DIAGNOSIS — E039 Hypothyroidism, unspecified: Secondary | ICD-10-CM | POA: Insufficient documentation

## 2018-04-21 DIAGNOSIS — J3489 Other specified disorders of nose and nasal sinuses: Secondary | ICD-10-CM | POA: Diagnosis not present

## 2018-04-21 DIAGNOSIS — H04223 Epiphora due to insufficient drainage, bilateral lacrimal glands: Secondary | ICD-10-CM | POA: Diagnosis not present

## 2018-04-21 DIAGNOSIS — L2089 Other atopic dermatitis: Secondary | ICD-10-CM | POA: Diagnosis not present

## 2018-06-11 HISTORY — PX: BROW LIFT: SHX178

## 2018-06-16 ENCOUNTER — Other Ambulatory Visit: Payer: Self-pay | Admitting: Ophthalmology

## 2018-06-16 DIAGNOSIS — H04222 Epiphora due to insufficient drainage, left lacrimal gland: Secondary | ICD-10-CM | POA: Diagnosis not present

## 2018-06-16 DIAGNOSIS — L2089 Other atopic dermatitis: Secondary | ICD-10-CM | POA: Diagnosis not present

## 2018-06-16 DIAGNOSIS — H02413 Mechanical ptosis of bilateral eyelids: Secondary | ICD-10-CM | POA: Diagnosis not present

## 2018-06-16 DIAGNOSIS — H04552 Acquired stenosis of left nasolacrimal duct: Secondary | ICD-10-CM | POA: Diagnosis not present

## 2018-06-16 DIAGNOSIS — H04223 Epiphora due to insufficient drainage, bilateral lacrimal glands: Secondary | ICD-10-CM | POA: Diagnosis not present

## 2018-06-16 DIAGNOSIS — J3489 Other specified disorders of nose and nasal sinuses: Secondary | ICD-10-CM | POA: Diagnosis not present

## 2018-06-16 DIAGNOSIS — H04412 Chronic dacryocystitis of left lacrimal passage: Secondary | ICD-10-CM | POA: Diagnosis not present

## 2018-06-16 DIAGNOSIS — L630 Alopecia (capitis) totalis: Secondary | ICD-10-CM | POA: Diagnosis not present

## 2018-06-19 ENCOUNTER — Other Ambulatory Visit: Payer: Self-pay | Admitting: Internal Medicine

## 2018-06-19 DIAGNOSIS — E7800 Pure hypercholesterolemia, unspecified: Secondary | ICD-10-CM | POA: Diagnosis not present

## 2018-06-19 DIAGNOSIS — R011 Cardiac murmur, unspecified: Secondary | ICD-10-CM

## 2018-06-19 DIAGNOSIS — N3946 Mixed incontinence: Secondary | ICD-10-CM | POA: Diagnosis not present

## 2018-06-19 DIAGNOSIS — N3030 Trigonitis without hematuria: Secondary | ICD-10-CM | POA: Diagnosis not present

## 2018-06-19 DIAGNOSIS — I10 Essential (primary) hypertension: Secondary | ICD-10-CM | POA: Diagnosis not present

## 2018-06-23 ENCOUNTER — Ambulatory Visit (HOSPITAL_COMMUNITY): Payer: Medicare Other | Attending: Cardiovascular Disease

## 2018-06-23 DIAGNOSIS — R011 Cardiac murmur, unspecified: Secondary | ICD-10-CM

## 2018-07-01 DIAGNOSIS — H04222 Epiphora due to insufficient drainage, left lacrimal gland: Secondary | ICD-10-CM | POA: Diagnosis not present

## 2018-07-01 DIAGNOSIS — H04552 Acquired stenosis of left nasolacrimal duct: Secondary | ICD-10-CM | POA: Diagnosis not present

## 2018-07-01 DIAGNOSIS — H02413 Mechanical ptosis of bilateral eyelids: Secondary | ICD-10-CM | POA: Diagnosis not present

## 2018-07-01 DIAGNOSIS — Z09 Encounter for follow-up examination after completed treatment for conditions other than malignant neoplasm: Secondary | ICD-10-CM | POA: Diagnosis not present

## 2018-07-02 DIAGNOSIS — N3941 Urge incontinence: Secondary | ICD-10-CM | POA: Diagnosis not present

## 2018-07-02 DIAGNOSIS — N390 Urinary tract infection, site not specified: Secondary | ICD-10-CM | POA: Diagnosis not present

## 2018-07-21 DIAGNOSIS — R319 Hematuria, unspecified: Secondary | ICD-10-CM | POA: Diagnosis not present

## 2018-07-21 DIAGNOSIS — N3941 Urge incontinence: Secondary | ICD-10-CM | POA: Diagnosis not present

## 2018-07-22 DIAGNOSIS — H02413 Mechanical ptosis of bilateral eyelids: Secondary | ICD-10-CM | POA: Diagnosis not present

## 2018-07-22 DIAGNOSIS — Z09 Encounter for follow-up examination after completed treatment for conditions other than malignant neoplasm: Secondary | ICD-10-CM | POA: Diagnosis not present

## 2018-07-22 DIAGNOSIS — S0181XS Laceration without foreign body of other part of head, sequela: Secondary | ICD-10-CM | POA: Diagnosis not present

## 2018-07-22 DIAGNOSIS — H57813 Brow ptosis, bilateral: Secondary | ICD-10-CM | POA: Diagnosis not present

## 2018-07-22 DIAGNOSIS — H04552 Acquired stenosis of left nasolacrimal duct: Secondary | ICD-10-CM | POA: Diagnosis not present

## 2018-07-22 DIAGNOSIS — H04222 Epiphora due to insufficient drainage, left lacrimal gland: Secondary | ICD-10-CM | POA: Diagnosis not present

## 2018-07-22 DIAGNOSIS — H53453 Other localized visual field defect, bilateral: Secondary | ICD-10-CM | POA: Diagnosis not present

## 2018-08-07 DIAGNOSIS — H04222 Epiphora due to insufficient drainage, left lacrimal gland: Secondary | ICD-10-CM | POA: Diagnosis not present

## 2018-08-07 DIAGNOSIS — H04552 Acquired stenosis of left nasolacrimal duct: Secondary | ICD-10-CM | POA: Diagnosis not present

## 2018-08-07 DIAGNOSIS — Z09 Encounter for follow-up examination after completed treatment for conditions other than malignant neoplasm: Secondary | ICD-10-CM | POA: Diagnosis not present

## 2018-08-07 DIAGNOSIS — Z4802 Encounter for removal of sutures: Secondary | ICD-10-CM | POA: Diagnosis not present

## 2018-08-22 ENCOUNTER — Other Ambulatory Visit: Payer: Self-pay | Admitting: Student

## 2018-08-22 DIAGNOSIS — Z96653 Presence of artificial knee joint, bilateral: Secondary | ICD-10-CM | POA: Diagnosis not present

## 2018-08-22 DIAGNOSIS — M1611 Unilateral primary osteoarthritis, right hip: Secondary | ICD-10-CM | POA: Diagnosis not present

## 2018-08-22 DIAGNOSIS — Z96642 Presence of left artificial hip joint: Secondary | ICD-10-CM | POA: Diagnosis not present

## 2018-08-22 DIAGNOSIS — Z471 Aftercare following joint replacement surgery: Secondary | ICD-10-CM | POA: Diagnosis not present

## 2018-08-26 ENCOUNTER — Encounter (HOSPITAL_COMMUNITY)
Admission: RE | Admit: 2018-08-26 | Discharge: 2018-08-26 | Disposition: A | Discharge status: Home / Self Care | Payer: Medicare Other | Source: Ambulatory Visit | Attending: Student | Admitting: Student

## 2018-08-26 ENCOUNTER — Other Ambulatory Visit: Payer: Self-pay

## 2018-08-26 ENCOUNTER — Ambulatory Visit (HOSPITAL_COMMUNITY)
Admission: RE | Admit: 2018-08-26 | Discharge: 2018-08-26 | Disposition: A | Discharge status: Home / Self Care | Payer: Medicare Other | Source: Ambulatory Visit | Attending: Student | Admitting: Student

## 2018-08-26 DIAGNOSIS — Z96653 Presence of artificial knee joint, bilateral: Secondary | ICD-10-CM

## 2018-08-26 DIAGNOSIS — R600 Localized edema: Secondary | ICD-10-CM | POA: Diagnosis not present

## 2018-08-26 MED ORDER — TECHNETIUM TC 99M MEDRONATE IV KIT
20.0000 | PACK | Freq: Once | INTRAVENOUS | Status: AC | PRN
  Administered 2018-08-26: 20 via INTRAVENOUS

## 2018-10-08 DIAGNOSIS — B351 Tinea unguium: Secondary | ICD-10-CM | POA: Diagnosis not present

## 2018-10-08 DIAGNOSIS — M79672 Pain in left foot: Secondary | ICD-10-CM | POA: Diagnosis not present

## 2018-10-08 DIAGNOSIS — M79671 Pain in right foot: Secondary | ICD-10-CM | POA: Diagnosis not present

## 2018-10-08 DIAGNOSIS — L84 Corns and callosities: Secondary | ICD-10-CM | POA: Diagnosis not present

## 2018-11-14 DIAGNOSIS — H04123 Dry eye syndrome of bilateral lacrimal glands: Secondary | ICD-10-CM | POA: Diagnosis not present

## 2018-11-14 DIAGNOSIS — H04223 Epiphora due to insufficient drainage, bilateral lacrimal glands: Secondary | ICD-10-CM | POA: Diagnosis not present

## 2018-11-14 DIAGNOSIS — L209 Atopic dermatitis, unspecified: Secondary | ICD-10-CM | POA: Diagnosis not present

## 2018-11-14 DIAGNOSIS — H04553 Acquired stenosis of bilateral nasolacrimal duct: Secondary | ICD-10-CM | POA: Diagnosis not present

## 2018-12-15 DIAGNOSIS — L281 Prurigo nodularis: Secondary | ICD-10-CM | POA: Diagnosis not present

## 2018-12-19 DIAGNOSIS — Z0000 Encounter for general adult medical examination without abnormal findings: Secondary | ICD-10-CM | POA: Diagnosis not present

## 2018-12-19 DIAGNOSIS — Z1389 Encounter for screening for other disorder: Secondary | ICD-10-CM | POA: Diagnosis not present

## 2019-01-15 ENCOUNTER — Other Ambulatory Visit: Payer: Self-pay | Admitting: Internal Medicine

## 2019-01-15 DIAGNOSIS — Z1231 Encounter for screening mammogram for malignant neoplasm of breast: Secondary | ICD-10-CM

## 2019-01-16 ENCOUNTER — Other Ambulatory Visit: Payer: Self-pay

## 2019-01-16 ENCOUNTER — Ambulatory Visit
Admission: RE | Admit: 2019-01-16 | Discharge: 2019-01-16 | Disposition: A | Discharge status: Home / Self Care | Payer: Medicare Other | Source: Ambulatory Visit | Attending: Internal Medicine | Admitting: Internal Medicine

## 2019-01-16 DIAGNOSIS — Z1231 Encounter for screening mammogram for malignant neoplasm of breast: Secondary | ICD-10-CM | POA: Diagnosis not present

## 2019-01-19 DIAGNOSIS — E669 Obesity, unspecified: Secondary | ICD-10-CM | POA: Diagnosis not present

## 2019-01-19 DIAGNOSIS — E039 Hypothyroidism, unspecified: Secondary | ICD-10-CM | POA: Diagnosis not present

## 2019-01-19 DIAGNOSIS — I10 Essential (primary) hypertension: Secondary | ICD-10-CM | POA: Diagnosis not present

## 2019-01-19 DIAGNOSIS — E7800 Pure hypercholesterolemia, unspecified: Secondary | ICD-10-CM | POA: Diagnosis not present

## 2019-01-19 DIAGNOSIS — Z1211 Encounter for screening for malignant neoplasm of colon: Secondary | ICD-10-CM | POA: Diagnosis not present

## 2019-03-09 DIAGNOSIS — Z23 Encounter for immunization: Secondary | ICD-10-CM | POA: Diagnosis not present

## 2019-03-18 DIAGNOSIS — Z1159 Encounter for screening for other viral diseases: Secondary | ICD-10-CM | POA: Diagnosis not present

## 2019-03-19 DIAGNOSIS — Z471 Aftercare following joint replacement surgery: Secondary | ICD-10-CM | POA: Diagnosis not present

## 2019-03-19 DIAGNOSIS — Z96653 Presence of artificial knee joint, bilateral: Secondary | ICD-10-CM | POA: Diagnosis not present

## 2019-03-19 DIAGNOSIS — Z96651 Presence of right artificial knee joint: Secondary | ICD-10-CM | POA: Diagnosis not present

## 2019-03-23 ENCOUNTER — Other Ambulatory Visit (HOSPITAL_COMMUNITY)
Admission: RE | Admit: 2019-03-23 | Discharge: 2019-03-23 | Disposition: A | Discharge status: Home / Self Care | Payer: Medicare Other | Source: Ambulatory Visit | Attending: Gastroenterology | Admitting: Gastroenterology

## 2019-03-23 ENCOUNTER — Encounter: Payer: Self-pay | Admitting: Cardiology

## 2019-03-23 ENCOUNTER — Ambulatory Visit (INDEPENDENT_AMBULATORY_CARE_PROVIDER_SITE_OTHER): Payer: Medicare Other | Admitting: Cardiology

## 2019-03-23 ENCOUNTER — Other Ambulatory Visit: Payer: Self-pay | Admitting: Gastroenterology

## 2019-03-23 ENCOUNTER — Encounter (HOSPITAL_COMMUNITY): Payer: Self-pay | Admitting: *Deleted

## 2019-03-23 ENCOUNTER — Other Ambulatory Visit: Payer: Self-pay

## 2019-03-23 ENCOUNTER — Telehealth: Payer: Self-pay | Admitting: Orthopaedic Surgery

## 2019-03-23 VITALS — BP 150/68 | HR 62 | Temp 97.3°F | Ht 64.0 in | Wt 217.0 lb

## 2019-03-23 DIAGNOSIS — Z20828 Contact with and (suspected) exposure to other viral communicable diseases: Secondary | ICD-10-CM | POA: Insufficient documentation

## 2019-03-23 DIAGNOSIS — I4891 Unspecified atrial fibrillation: Secondary | ICD-10-CM | POA: Insufficient documentation

## 2019-03-23 LAB — BASIC METABOLIC PANEL
BUN: 15 (ref 4–21)
Creatinine: 0.8 (ref 0.5–1.1)
Glucose: 128
Potassium: 3.9 (ref 3.4–5.3)
Sodium: 143 (ref 137–147)

## 2019-03-23 LAB — CBC AND DIFFERENTIAL
HCT: 42 (ref 36–46)
Hemoglobin: 14.2 (ref 12.0–16.0)
Platelets: 306 (ref 150–399)
WBC: 9.2

## 2019-03-23 NOTE — Progress Notes (Addendum)
Ms Linda Rice denies chest pain or shortness of breath. Patient had Covid test this afternoon and has been in quarantine since that time.  Ms Linda Rice was at [place] getting ready for a Colonoscopy and  EKG strip looked like Afib.  Patient was seen by Dr Virgina Jock this after noon and he did not find patient to be in Afib, it did look like much change from previous EKG.  Patient is to follow up with cardiologist  in 3 months.

## 2019-03-23 NOTE — Progress Notes (Signed)
03/23/2019

## 2019-03-23 NOTE — Progress Notes (Signed)
Patient referred by [name], MD for irregular heart rhythm  Subjective:   Linda Rice, female    DOB: 1941/01/16, 78 y.o.   MRN: [id number]   Chief Complaint  Patient presents with  . Atrial Fibrillation  . New Patient (Initial Visit)     HPI  79 y.o. Caucasian female with hypertension, hyperlipidemia, asthma, hypothyroidism, now referred for evaluation of possible atrial fibrillation.  Stated atrial fibrillation was reportedly found to today on telemetry, when patient was scheduled to undergo screening colonoscopy given fecal blood test being positive. Colonoscopy was canceled and she was referred for evaluation.  Patient is fairly active at baseline, walks her dog 6 time a day. She used to swim regularly prior to the pandemic. She denies chest pain, shortness of breath, palpitations, leg edema, orthopnea, PND, TIA/syncope.   Her blood pressure is slightly elevated today, but she is visibly anxious.   I have reviewed telemetry form earlier today, EKG from today and prior EKG from 2017. Details below. I also reviewed her echocardiogram from 06/2018.   Past Medical History:  Diagnosis Date  . Allergy   . Alopecia   . Arthritis   . Asthma   . Bladder incontinence    "I HAVE A SLIGHT PROBLEM"  . Complication of anesthesia    "I CRY WHEN I WAKE UP"  . Eczema   . History of transfusion    AUTOGOLUS  . Hypertension   . Osteopenia   . Thyroid disease      Past Surgical History:  Procedure Laterality Date  . APPENDECTOMY    . BREAST EXCISIONAL BIOPSY Right 1990  . BREAST SURGERY     BENIGN MASS REMOVED RT BREAST  . CHOLECYSTECTOMY    . EYE SURGERY     LID SURGERY  . JOINT REPLACEMENT     RT KNEE/ REVISION / LEFT KNEE   . LEFT OOPHORECTOMY    . TOTAL HIP ARTHROPLASTY Left 09/05/2015   Procedure: LEFT TOTAL HIP ARTHROPLASTY ANTERIOR APPROACH;  Surgeon: [name], MD;  Location: [facility];  Service: Orthopedics;  Laterality: Left;  . TUBAL LIGATION        Social History   Socioeconomic History  . Marital status: Divorced    Spouse name: Not on file  . Number of children: Not on file  . Years of education: Not on file  . Highest education level: Not on file  Occupational History  . Not on file  Social Needs  . Financial resource strain: Not on file  . Food insecurity    Worry: Not on file    Inability: Not on file  . Transportation needs    Medical: Not on file    Non-medical: Not on file  Tobacco Use  . Smoking status: Never Smoker  . Smokeless tobacco: Never Used  Substance and Sexual Activity  . Alcohol use: Yes    Alcohol/week: 1.0 - 5.0 standard drinks    Types: 1 - 5 Standard drinks or equivalent per week    Comment: 3 PER WEEK  . Drug use: No  . Sexual activity: Not on file  Lifestyle  . Physical activity    Days per week: Not on file    Minutes per session: Not on file  . Stress: Not on file  Relationships  . Social Herbalist on phone: Not on file    Gets together: Not on file    Attends religious service: Not on file  Active member of club or organization: Not on file    Attends meetings of clubs or organizations: Not on file    Relationship status: Not on file  . Intimate partner violence    Fear of current or ex partner: Not on file    Emotionally abused: Not on file    Physically abused: Not on file    Forced sexual activity: Not on file  Other Topics Concern  . Not on file  Social History Narrative  . Not on file     Family History  Problem Relation Age of Onset  . Diabetes Mother   . Heart disease Mother   . Hyperlipidemia Mother   . Hypertension Mother   . Stroke Mother   . Hyperlipidemia Father   . Hyperlipidemia Sister   . Breast cancer Neg Hx      Current Outpatient Medications on File Prior to Visit  Medication Sig Dispense Refill  . albuterol (PROVENTIL HFA;VENTOLIN HFA) 108 (90 BASE) MCG/ACT inhaler Inhale 1-2 puffs into the lungs every 6 (six) hours as needed  for wheezing or shortness of breath.     Marland Kitchen amLODipine (NORVASC) 5 MG tablet Take 5 mg by mouth daily.    . cetirizine (ZYRTEC) 10 MG tablet Take 10 mg by mouth daily.    Marland Kitchen doxycycline (VIBRAMYCIN) 100 MG capsule Take 100 mg by mouth 2 (two) times daily.  4  . Fluticasone-Salmeterol (ADVAIR) 250-50 MCG/DOSE AEPB Inhale 1 puff into the lungs 2 (two) times daily.    Marland Kitchen levothyroxine (SYNTHROID, LEVOTHROID) 88 MCG tablet Take 88 mcg by mouth daily before breakfast.    . methocarbamol (ROBAXIN) 500 MG tablet Take 1 tablet (500 mg total) by mouth every 6 (six) hours as needed for muscle spasms. 80 tablet 0  . oxyCODONE (OXY IR/ROXICODONE) 5 MG immediate release tablet Take 1-2 tablets (5-10 mg total) by mouth every 3 (three) hours as needed for moderate pain or severe pain. (Patient not taking: Reported on 06/18/2016) 80 tablet 0  . rivaroxaban (XARELTO) 10 MG TABS tablet Take 1 tablet (10 mg total) by mouth daily with breakfast. Take Xarelto for two and a half more weeks, then discontinue Xarelto. Once the patient has completed the blood thinner regimen, then take a Baby 81 mg Aspirin daily for three more weeks. (Patient not taking: Reported on 06/18/2016) 19 tablet 0  . sulfamethoxazole-trimethoprim (BACTRIM,SEPTRA) 400-80 MG tablet Take 0.5 tablets by mouth every other day.  5  . traMADol (ULTRAM) 50 MG tablet Take 1-2 tablets (50-100 mg total) by mouth every 6 (six) hours as needed (mild pain). (Patient not taking: Reported on 06/18/2016) 80 tablet 1   No current facility-administered medications on file prior to visit.     Cardiovascular studies:  EKG 03/23/2019:  Sinus rhythm 86 bpm with sinus arrhtymia. Occasional PAC.    Poor R-wave progression. Nonspecific ST depression.  Echocardiogram 06/23/2018: - Left ventricle: The cavity size was normal. Wall thickness was   normal. Wall motion was normal; there were no regional wall   motion abnormalities. - Right atrium: The atrium was mildly dilated.  - Pulmonary arteries: Systolic pressure was mildly increased. PA   peak pressure: 34 mm Hg (S).   Recent labs: 03/23/2019: Glucose 128. BUN/Cr 15/0.78. eGFR 72. Na/K 143/3.9. H/H 14/42. MCV 90. Platelets 306.  01/19/2019: Glucose 85. BUN/Cr 20/0.78. eGFR 72. Na/K 142/4.1. Rest of the CMP normal Chol 186, TG 132, HDL 59, LDL 101.  TSH 0.97 normal Occult fecal blood positive  Review of Systems  Constitution: Negative for decreased appetite, malaise/fatigue, weight gain and weight loss.  HENT: Negative for congestion.   Eyes: Negative for visual disturbance.  Cardiovascular: Negative for chest pain, dyspnea on exertion, leg swelling, palpitations and syncope.  Respiratory: Negative for cough.   Endocrine: Negative for cold intolerance.  Hematologic/Lymphatic: Does not bruise/bleed easily.  Skin: Negative for itching and rash.       Chronic hair loss  Musculoskeletal: Negative for myalgias.  Gastrointestinal: Negative for abdominal pain, nausea and vomiting.  Genitourinary: Negative for dysuria.  Neurological: Negative for dizziness and weakness.  Psychiatric/Behavioral: The patient is nervous/anxious.   All other systems reviewed and are negative.        Vitals:   03/23/19 1241  BP: (!) 150/68  Pulse: 62  Temp: (!) 97.3 F (36.3 C)  SpO2: 99%     Body mass index is 37.25 kg/m. Filed Weights   03/23/19 1241  Weight: 217 lb (98.4 kg)     Objective:   Physical Exam  Constitutional: She is oriented to person, place, and time. She appears well-developed and well-nourished. No distress.  HENT:  Head: Normocephalic and atraumatic.  Eyes: Pupils are equal, round, and reactive to light. Conjunctivae are normal.  Neck: No JVD present.  Cardiovascular: Normal rate and intact distal pulses. A regularly irregular rhythm present.  No murmur heard. Pulmonary/Chest: Effort normal and breath sounds normal. She has no wheezes. She has no rales.  Abdominal: Soft. Bowel  sounds are normal. There is no rebound.  Musculoskeletal:        General: No edema.  Lymphadenopathy:    She has no cervical adenopathy.  Neurological: She is alert and oriented to person, place, and time. No cranial nerve deficit.  Skin: Skin is warm and dry.  Psychiatric: She has a normal mood and affect.  Nursing note and vitals reviewed.         Assessment & Recommendations:   78 y.o. Caucasian female with hypertension, hyperlipidemia, asthma, hypothyroidism, referred for evaluation of irregular heart rhythm  Irregular heart rhythm: While limited telemetry from earlier today was suspicious for atrial fibrillation, careful evaluation shows small P waves with sinus arrhythmia. This was also present on EKG in 2017, and is present on EKG today. This is baseline for the patient, and possibly related to her asthma. I do not see any convincing evidence of atrial fibrillation. Even if she were to have atrial fibrillation, as long as rate is controlled, I recommend proceeding with colonoscopy. This is because, In light of fecal occult blood test, I would like to know information from colonoscopy before making recommendation regarding anticoagulation or cardioversion.   In summary, I recommend proceeding with colonoscopy at this time.  I will see her back in 3 months for follow up.  Thank you for referring the patient to [place]. Please feel free to contact with any questions.  Nigel Mormon, MD [facility] Pager: [phone] Office: [phone] If no answer Cell [phone]

## 2019-03-23 NOTE — Telephone Encounter (Signed)
Received vm from pt wanting xrays from 2008-2010. I spoke with our xray dept and those xrays are no longer available. IC pt and lmvm and advised.

## 2019-03-23 NOTE — Anesthesia Preprocedure Evaluation (Addendum)
Anesthesia Evaluation  Patient identified by MRN, date of birth, ID band Patient awake    Reviewed: Allergy & Precautions, H&P , NPO status , Patient's Chart, lab work & pertinent test results  Airway Mallampati: II  TM Distance: >3 FB Neck ROM: Full    Dental no notable dental hx. (+) Partial Upper, Dental Advisory Given   Pulmonary asthma , former smoker,    Pulmonary exam normal breath sounds clear to auscultation       Cardiovascular Exercise Tolerance: Good hypertension, Pt. on medications  Rhythm:Regular Rate:Normal     Neuro/Psych Depression negative neurological ROS     GI/Hepatic negative GI ROS, Neg liver ROS,   Endo/Other  Hypothyroidism   Renal/GU negative Renal ROS  negative genitourinary   Musculoskeletal  (+) Arthritis , Osteoarthritis,    Abdominal   Peds  Hematology negative hematology ROS (+)   Anesthesia Other Findings   Reproductive/Obstetrics negative OB ROS                            Anesthesia Physical Anesthesia Plan  ASA: II  Anesthesia Plan: MAC   Post-op Pain Management:    Induction: Intravenous  PONV Risk Score and Plan: 2 and Propofol infusion and Ondansetron  Airway Management Planned: Simple Face Mask  Additional Equipment:   Intra-op Plan:   Post-operative Plan:   Informed Consent: I have reviewed the patients History and Physical, chart, labs and discussed the procedure including the risks, benefits and alternatives for the proposed anesthesia with the patient or authorized representative who has indicated his/her understanding and acceptance.     Dental advisory given  Plan Discussed with: CRNA  Anesthesia Plan Comments:         Anesthesia Quick Evaluation

## 2019-03-24 ENCOUNTER — Ambulatory Visit (HOSPITAL_COMMUNITY)
Admission: RE | Admit: 2019-03-24 | Discharge: 2019-03-24 | Disposition: A | Discharge status: Home / Self Care | Payer: Medicare Other | Attending: Gastroenterology | Admitting: Gastroenterology

## 2019-03-24 ENCOUNTER — Encounter (HOSPITAL_COMMUNITY)
Admission: RE | Disposition: A | Discharge status: Home / Self Care | Payer: Self-pay | Source: Home / Self Care | Attending: Gastroenterology

## 2019-03-24 ENCOUNTER — Ambulatory Visit (HOSPITAL_COMMUNITY): Payer: Medicare Other | Admitting: Anesthesiology

## 2019-03-24 ENCOUNTER — Encounter (HOSPITAL_COMMUNITY): Payer: Self-pay | Admitting: Certified Registered Nurse Anesthetist

## 2019-03-24 DIAGNOSIS — Q438 Other specified congenital malformations of intestine: Secondary | ICD-10-CM | POA: Insufficient documentation

## 2019-03-24 DIAGNOSIS — J4520 Mild intermittent asthma, uncomplicated: Secondary | ICD-10-CM | POA: Insufficient documentation

## 2019-03-24 DIAGNOSIS — Z91018 Allergy to other foods: Secondary | ICD-10-CM | POA: Diagnosis not present

## 2019-03-24 DIAGNOSIS — Z96642 Presence of left artificial hip joint: Secondary | ICD-10-CM | POA: Diagnosis not present

## 2019-03-24 DIAGNOSIS — K5790 Diverticulosis of intestine, part unspecified, without perforation or abscess without bleeding: Secondary | ICD-10-CM | POA: Diagnosis not present

## 2019-03-24 DIAGNOSIS — K635 Polyp of colon: Secondary | ICD-10-CM | POA: Diagnosis not present

## 2019-03-24 DIAGNOSIS — Z7951 Long term (current) use of inhaled steroids: Secondary | ICD-10-CM | POA: Insufficient documentation

## 2019-03-24 DIAGNOSIS — E039 Hypothyroidism, unspecified: Secondary | ICD-10-CM | POA: Insufficient documentation

## 2019-03-24 DIAGNOSIS — Z79890 Hormone replacement therapy (postmenopausal): Secondary | ICD-10-CM | POA: Diagnosis not present

## 2019-03-24 DIAGNOSIS — Z87891 Personal history of nicotine dependence: Secondary | ICD-10-CM | POA: Insufficient documentation

## 2019-03-24 DIAGNOSIS — Z96651 Presence of right artificial knee joint: Secondary | ICD-10-CM | POA: Diagnosis not present

## 2019-03-24 DIAGNOSIS — D123 Benign neoplasm of transverse colon: Secondary | ICD-10-CM | POA: Diagnosis not present

## 2019-03-24 DIAGNOSIS — Z79899 Other long term (current) drug therapy: Secondary | ICD-10-CM | POA: Insufficient documentation

## 2019-03-24 DIAGNOSIS — I10 Essential (primary) hypertension: Secondary | ICD-10-CM | POA: Insufficient documentation

## 2019-03-24 DIAGNOSIS — E785 Hyperlipidemia, unspecified: Secondary | ICD-10-CM | POA: Insufficient documentation

## 2019-03-24 DIAGNOSIS — Z86010 Personal history of colonic polyps: Secondary | ICD-10-CM | POA: Insufficient documentation

## 2019-03-24 DIAGNOSIS — K5730 Diverticulosis of large intestine without perforation or abscess without bleeding: Secondary | ICD-10-CM | POA: Insufficient documentation

## 2019-03-24 DIAGNOSIS — Z6837 Body mass index (BMI) 37.0-37.9, adult: Secondary | ICD-10-CM | POA: Diagnosis not present

## 2019-03-24 DIAGNOSIS — R195 Other fecal abnormalities: Secondary | ICD-10-CM | POA: Diagnosis present

## 2019-03-24 HISTORY — DX: Pneumonia, unspecified organism: J18.9

## 2019-03-24 HISTORY — PX: COLONOSCOPY WITH PROPOFOL: SHX5780

## 2019-03-24 HISTORY — PX: POLYPECTOMY: SHX5525

## 2019-03-24 HISTORY — DX: Ventricular premature depolarization: I49.3

## 2019-03-24 HISTORY — DX: Hypothyroidism, unspecified: E03.9

## 2019-03-24 LAB — SARS CORONAVIRUS 2 (TAT 6-24 HRS): SARS Coronavirus 2: NEGATIVE

## 2019-03-24 SURGERY — COLONOSCOPY WITH PROPOFOL
Anesthesia: Monitor Anesthesia Care

## 2019-03-24 MED ORDER — SODIUM CHLORIDE 0.9 % IV SOLN: INTRAVENOUS | Status: DC

## 2019-03-24 MED ORDER — PROPOFOL 10 MG/ML IV BOLUS
INTRAVENOUS | Status: DC | PRN
  Administered 2019-03-24 (×2): 20 mg via INTRAVENOUS
  Administered 2019-03-24: 30 mg via INTRAVENOUS

## 2019-03-24 MED ORDER — LACTATED RINGERS IV SOLN
INTRAVENOUS | Status: AC | PRN
  Administered 2019-03-24: 1000 mL via INTRAVENOUS

## 2019-03-24 MED ORDER — PROPOFOL 500 MG/50ML IV EMUL
INTRAVENOUS | Status: DC | PRN
  Administered 2019-03-24: 75 ug/kg/min via INTRAVENOUS

## 2019-03-24 MED ORDER — LIDOCAINE 2% (20 MG/ML) 5 ML SYRINGE
INTRAMUSCULAR | Status: DC | PRN
  Administered 2019-03-24: 60 mg via INTRAVENOUS

## 2019-03-24 SURGICAL SUPPLY — 22 items

## 2019-03-24 NOTE — Discharge Instructions (Signed)
Call if question or problem otherwise call for biopsy report in 1 week and first meal have soft solids and slowly advance as tolerated and follow-up in 1 month to repeat outpatient stool for blood just to be sure and make sure no further work-up and plans as needed but call sooner if question or problem  YOU HAD AN ENDOSCOPIC PROCEDURE TODAY: Refer to the procedure report and other information in the discharge instructions given to you for any specific questions about what was found during the examination. If this information does not answer your questions, please call [facility] office at [phone] to clarify.   YOU SHOULD EXPECT: Some feelings of bloating in the abdomen. Passage of more gas than usual. Walking can help get rid of the air that was put into your GI tract during the procedure and reduce the bloating. If you had a lower endoscopy (such as a colonoscopy or flexible sigmoidoscopy) you may notice spotting of blood in your stool or on the toilet paper. Some abdominal soreness may be present for a day or two, also.  DIET: Your first meal following the procedure should be a light meal and then it is ok to progress to your normal diet. A half-sandwich or bowl of soup is an example of a good first meal. Heavy or fried foods are harder to digest and may make you feel nauseous or bloated. Drink plenty of fluids but you should avoid alcoholic beverages for 24 hours. If you had a esophageal dilation, please see attached instructions for diet.   ACTIVITY: Your care partner should take you home directly after the procedure. You should plan to take it easy, moving slowly for the rest of the day. You can resume normal activity the day after the procedure however YOU SHOULD NOT DRIVE, use power tools, machinery or perform tasks that involve climbing or major physical exertion for 24 hours (because of the sedation medicines used during the test).   SYMPTOMS TO REPORT IMMEDIATELY: A gastroenterologist can be  reached at any hour. Please call [phone]  for any of the following symptoms:   Following lower endoscopy (colonoscopy, flexible sigmoidoscopy) Excessive amounts of blood in the stool  Significant tenderness, worsening of abdominal pains  Swelling of the abdomen that is new, acute  Fever of 100 or higher   Following upper endoscopy (EGD, EUS, ERCP, esophageal dilation) Vomiting of blood or coffee ground material  New, significant abdominal pain  New, significant chest pain or pain under the shoulder blades  Painful or persistently difficult swallowing  New shortness of breath  Black, tarry-looking or red, bloody stools  FOLLOW UP:  If any biopsies were taken you will be contacted by phone or by letter within the next 1-3 weeks. Call [phone]  if you have not heard about the biopsies in 3 weeks.  Please also call with any specific questions about appointments or follow up tests.

## 2019-03-24 NOTE — Op Note (Signed)
[facility] Patient Name: Linda Rice Procedure Date : 03/24/2019 MRN: [id number] Attending MD: Clarene Essex , MD Date of Birth: [birth year] CSN: [id number] Age: 78 Admit Type: Inpatient Procedure:                Colonoscopy Indications:              High risk colon cancer surveillance: Personal                            history of colonic polyps, Last colonoscopy: 2013,                            Incidental - Positive fecal immunochemical test Providers:                Clarene Essex, MD, Jeanella Cara, RN, Lina Sar, Technician, Wyatt Haste Muqtasid CRNA Referring MD:              Medicines:                Propofol total dose 460 mg IV,60mg  IV lidocaine Complications:            No immediate complications. Estimated Blood Loss:     Estimated blood loss: none. Procedure:                Pre-Anesthesia Assessment:                           - Prior to the procedure, a History and Physical                            was performed, and patient medications and                            allergies were reviewed. [name] tolerance of                            previous anesthesia was also reviewed. The risks                            and benefits of the procedure and the sedation                            options and risks were discussed with the patient.                            All questions were answered, and informed consent                            was obtained. Prior Anticoagulants: The patient has                            taken no previous anticoagulant or antiplatelet  agents. ASA Grade Assessment: II - A patient with                            mild systemic disease. After reviewing the risks                            and benefits, the patient was deemed in                            satisfactory condition to undergo the procedure.                           After obtaining informed  consent, the colonoscope                            was passed under direct vision. Throughout the                            procedure, the patient's blood pressure, pulse, and                            oxygen saturations were monitored continuously. The                            [id number]-[id number] ([id number]) Olympus pediatric colonscope                            was introduced through the anus and advanced to the                            the cecum, identified by appendiceal orifice and                            ileocecal valve. The ileocecal valve, appendiceal                            orifice, and rectum were photographed. The                            colonoscopy was somewhat difficult due to multiple                            diverticula in the colon, inadequate bowel prep,                            significant looping and a tortuous colon.                            Successful completion of the procedure was aided by                            applying abdominal pressure. The patient tolerated  the procedure fairly well. The quality of the bowel                            preparation was fair over 1 L of liquid was used                            for washing. Scope In: 7:47:23 AM Scope Out: 8:24:58 AM Scope Withdrawal Time: 0 hours 23 minutes 42 seconds  Total Procedure Duration: 0 hours 37 minutes 35 seconds  Findings:      Internal hemorrhoids were found during retroflexion, during perianal       exam and during digital exam. The hemorrhoids were small.      Multiple small and large-mouthed diverticula were found in the sigmoid       colon, descending colon, transverse colon and ascending colon.      A small polyp was found in the distal transverse colon. The polyp was       semi-sessile. The polyp was removed with a cold snare. Resection and       retrieval were complete.      The exam was otherwise without abnormality. Impression:               -  Internal hemorrhoids.                           - Diverticulosis in the sigmoid colon, in the                            descending colon, in the transverse colon and in                            the ascending colon.                           - One small polyp in the distal transverse colon,                            removed with a cold snare. Resected and retrieved.                           - The examination was otherwise normal. Recommendation:           - Patient has a contact number available for                            emergencies. The signs and symptoms of potential                            delayed complications were discussed with the                            patient. Return to normal activities tomorrow.                            Written discharge instructions were provided to the  patient.                           - Soft diet today.                           - Continue present medications.                           - Await pathology results.                           - Repeat colonoscopy date to be determined after                            pending pathology results are reviewed for                            surveillance based on pathology results.                           - Return to GI clinic in 4 weeks. To repeat                            outpatient guaiacs to make sure no further work-up                            is needed                           - Telephone GI clinic for pathology results in 1                            week.                           - Telephone GI clinic if symptomatic PRN. Procedure Code(s):        --- Professional ---                           [phone], Colonoscopy, flexible; with removal of                            tumor(s), polyp(s), or other lesion(s) by snare                            technique Diagnosis Code(s):        --- Professional ---                           Z86.010, Personal history of colonic  polyps                           K63.5, Polyp of colon                           K57.30, Diverticulosis of large intestine without  perforation or abscess without bleeding CPT copyright 2019 [employer]. All rights reserved. The codes documented in this report are preliminary and upon coder review may  be revised to meet current compliance requirements. Clarene Essex, MD 03/24/2019 8:36:32 AM This report has been signed electronically. Number of Addenda: 0

## 2019-03-24 NOTE — Anesthesia Procedure Notes (Signed)
Procedure Name: MAC Date/Time: 03/24/2019 7:47 AM Performed by: Larene Beach, CRNA Pre-anesthesia Checklist: Patient identified, Emergency Drugs available, Suction available and Patient being monitored Patient Re-evaluated:Patient Re-evaluated prior to induction Oxygen Delivery Method: Nasal cannula

## 2019-03-24 NOTE — Transfer of Care (Signed)
Immediate Anesthesia Transfer of Care Note  Patient: Linda Rice  Procedure(s) Performed: COLONOSCOPY WITH PROPOFOL (N/A ) POLYPECTOMY  Patient Location: Endoscopy Unit  Anesthesia Type:MAC  Level of Consciousness: drowsy and patient cooperative  Airway & Oxygen Therapy: Patient Spontanous Breathing and Patient connected to nasal cannula oxygen  Post-op Assessment: Report given to RN, Post -op Vital signs reviewed and stable and Patient moving all extremities X 4  Post vital signs: Reviewed and stable  Last Vitals:  Vitals Value Taken Time  BP    Temp    Pulse    Resp    SpO2      Last Pain:  Vitals:   03/24/19 0714  TempSrc: Temporal  PainSc: 0-No pain         Complications: No apparent anesthesia complications

## 2019-03-24 NOTE — Anesthesia Postprocedure Evaluation (Signed)
Anesthesia Post Note  Patient: Linda Rice  Procedure(s) Performed: COLONOSCOPY WITH PROPOFOL (N/A ) POLYPECTOMY     Patient location during evaluation: Endoscopy Anesthesia Type: MAC Level of consciousness: awake and alert Pain management: pain level controlled Vital Signs Assessment: post-procedure vital signs reviewed and stable Respiratory status: spontaneous breathing, nonlabored ventilation and respiratory function stable Cardiovascular status: stable and blood pressure returned to baseline Postop Assessment: no apparent nausea or vomiting Anesthetic complications: no    Last Vitals:  Vitals:   03/24/19 0835 03/24/19 0845  BP: (!) 166/105 (!) 141/80  Pulse: (!) 105 99  Resp: 15 15  Temp: (!) 36.2 C   SpO2: 95% 100%    Last Pain:  Vitals:   03/24/19 0835  TempSrc: Temporal  PainSc: 1                  Corey Caulfield,W. EDMOND

## 2019-03-24 NOTE — Progress Notes (Signed)
Linda Rice 7:30 AM  Subjective: Patient without any GI complaints but found to be guaiac positive as an outpatient and underwent cardiology work-up yesterday and is cleared for colonoscopy and she also has a history of colon polyps and the procedure was rediscussed  Objective: Vital signs stable afebrile no acute distress exam please see preassessment evaluation  Assessment: Guaiac positive history of colon polyps overdue for colonic screening  Plan: Okay to proceed with colonoscopy today with anesthesia assistance  [facility]  office [phone] After 5PM or if no answer call [phone]

## 2019-03-25 LAB — SURGICAL PATHOLOGY

## 2019-03-25 NOTE — Progress Notes (Signed)
Called patient for post procedure follow up for colonoscopy.  Pt stated that when she got home, she had severe pain in her abdomen at her bladder.  She called her urologist and was told to straight cath at home.  Her output was dark, bloody, and purple in color and the urine output was over 1L.  The pt stated that she normally straight cath 2-3x a day.  Pt stated she was no longer in pain.  Advised pt to inform providers of need to straight cath daily.  Pt verbalized understanding.

## 2019-04-13 ENCOUNTER — Encounter: Payer: Self-pay | Admitting: Family Medicine

## 2019-04-13 ENCOUNTER — Encounter (INDEPENDENT_AMBULATORY_CARE_PROVIDER_SITE_OTHER): Payer: Self-pay | Admitting: Family Medicine

## 2019-04-13 ENCOUNTER — Ambulatory Visit (INDEPENDENT_AMBULATORY_CARE_PROVIDER_SITE_OTHER): Payer: Medicare Other | Admitting: Family Medicine

## 2019-04-13 ENCOUNTER — Other Ambulatory Visit (INDEPENDENT_AMBULATORY_CARE_PROVIDER_SITE_OTHER): Payer: Self-pay

## 2019-04-13 ENCOUNTER — Other Ambulatory Visit: Payer: Self-pay

## 2019-04-13 VITALS — BP 120/78 | HR 58 | Temp 97.9°F | Ht 64.0 in | Wt 213.0 lb

## 2019-04-13 DIAGNOSIS — Z91018 Allergy to other foods: Secondary | ICD-10-CM | POA: Diagnosis not present

## 2019-04-13 DIAGNOSIS — E559 Vitamin D deficiency, unspecified: Secondary | ICD-10-CM | POA: Diagnosis not present

## 2019-04-13 DIAGNOSIS — Z9189 Other specified personal risk factors, not elsewhere classified: Secondary | ICD-10-CM | POA: Diagnosis not present

## 2019-04-13 DIAGNOSIS — E039 Hypothyroidism, unspecified: Secondary | ICD-10-CM | POA: Diagnosis not present

## 2019-04-13 DIAGNOSIS — Z0289 Encounter for other administrative examinations: Secondary | ICD-10-CM

## 2019-04-13 DIAGNOSIS — R5383 Other fatigue: Secondary | ICD-10-CM

## 2019-04-13 DIAGNOSIS — R739 Hyperglycemia, unspecified: Secondary | ICD-10-CM | POA: Diagnosis not present

## 2019-04-13 DIAGNOSIS — Z6836 Body mass index (BMI) 36.0-36.9, adult: Secondary | ICD-10-CM

## 2019-04-13 DIAGNOSIS — R0600 Dyspnea, unspecified: Secondary | ICD-10-CM | POA: Diagnosis not present

## 2019-04-13 DIAGNOSIS — I10 Essential (primary) hypertension: Secondary | ICD-10-CM

## 2019-04-13 DIAGNOSIS — R0609 Other forms of dyspnea: Secondary | ICD-10-CM

## 2019-04-13 NOTE — Progress Notes (Signed)
.  Office: [phone]  /  Fax: [phone]   HPI:   Chief Complaint: OBESITY  Linda Rice (MR# [id number]) is a 78 y.o. female who presents on 04/14/2019 for obesity evaluation and treatment. Current BMI is Body mass index is 36.56 kg/m.Linda Rice has struggled with obesity for years and has been unsuccessful in either losing weight or maintaining long term weight loss. Linda Rice attended our information session and states she is currently in the action stage of change and ready to dedicate time achieving and maintaining a healthier weight.   Linda Rice has a history of multiple food allergies and eats lots of simple carbohydrates since they are safe. She states she wants to be healthier. Her daughter is in the program and has lost 50 lbs.  Linda Rice states she eats with friends in the community 2-3 times weekly she thinks her family will eat healthier with her her desired weight loss is 38 lbs she has been heavy most of adult her life she started gaining weight after giving birth to twins her heaviest weight ever was 250 lbs. she has significant food cravings issues  she frequently eats larger portions than normal  she has binge eating behaviors she struggles with emotional eating    Fatigue Linda Rice feels her energy is lower than it should be. This has worsened with weight gain and has worsened recently. Linda Rice denies daytime somnolence and denies waking up still tired. Patient generally gets 7.5-8.5 hours of sleep per night, and states they generally have restful sleep. Snoring is not present. Apneic episodes are not present. Epworth Sleepiness Score is 4.  Dyspnea on exertion Linda Rice notes increasing shortness of breath with exercising and seems to be worsening over time with weight gain. She notes getting out of breath sooner with activity than she used to. This has not gotten worse recently. Linda Rice denies orthopnea.  Hypothyroidism Linda Rice has a diagnosis of hypothyroidism.  She is on levothyroxine. She denies hot or cold intolerance or palpitations, but does admit to ongoing fatigue.  Hypertension Linda Rice is a 78 y.o. female with hypertension.  Linda Rice denies chest pain or shortness of breath on exertion. She is working weight loss to help control her blood pressure with the goal of decreasing her risk of heart attack and stroke. Barbaras blood pressure is currently controlled.  Food Allergies Linda Rice   Depression Screen Linda Rice's Food and Mood (modified PHQ-9) score was 12. Depression screen PHQ 2/9 04/13/2019  Decreased Interest 1  Down, Depressed, Hopeless 1  PHQ - 2 Score 2  Altered sleeping 1  Tired, decreased energy 1  Change in appetite 2  Feeling bad or failure about yourself  3  Trouble concentrating 1  Moving slowly or fidgety/restless 1  Suicidal thoughts 1  PHQ-9 Score 12  Difficult doing work/chores Somewhat difficult    ASSESSMENT AND PLAN:  Other fatigue - Plan: CBC with Differential/Platelet, Comprehensive metabolic panel, TSH, T4, free  Dyspnea on exertion  Hyperglycemia - Plan: Insulin, Free (Bioactive)  Acquired hypothyroidism - Plan: T3  Essential hypertension  Food allergy - Significant issues with food and environmental allergies. Causing patient distress re: her ability to make healthy food choices. - Plan: Ambulatory referral to Allergy  Vitamin D deficiency - Plan: VITAMIN D 25 Hydroxy (Vit-D Deficiency, Fractures)  At risk for osteoporosis  Class 2 severe obesity with serious comorbidity and body mass index (BMI) of 36.0 to 36.9 in adult, unspecified obesity type ([place])  PLAN:  Fatigue  Linda Rice was informed that her fatigue may be related to obesity, depression or many other causes. Labs will be ordered, and in the meanwhile Linda Rice has agreed to work on diet, exercise and weight loss to help with fatigue. Proper sleep hygiene was discussed including the need for 7-8 hours of quality sleep  each night. A sleep study was not ordered based on symptoms and Epworth score.  Dyspnea on exertion Linda Rice's shortness of breath appears to be obesity related and exercise induced. She has agreed to work on weight loss and gradually increase exercise to treat her exercise induced shortness of breath. If Linda Rice follows our instructions and loses weight without improvement of her shortness of breath, we will plan to refer to pulmonology. We will monitor this condition regularly. [name] agrees to this plan.  Hypothyroidism Linda Rice was informed of the importance of good thyroid control to help with weight loss efforts. She was also informed that supertheraputic thyroid levels are dangerous and will not improve weight loss results. Linda Rice will have labs drawn and follow-up as directed.  Hypertension We discussed sodium restriction, working on healthy weight loss, and a regular exercise program as the means to achieve improved blood pressure control. Linda Rice agreed with this plan and agreed to follow up as directed. We will continue to monitor her blood pressure as well as her progress with the above lifestyle modifications. She will have labs drawn and will watch for signs of hypotension as she continues her lifestyle modifications.  Food Allergies Linda Rice will be referred to an Allergist since she has not seen one since the 1990s.   Depression Screen Linda Rice had a moderately positive depression screening. Depression is commonly associated with obesity and often results in emotional eating behaviors. We will monitor this closely and work on CBT to help improve the non-hunger eating patterns. Referral to Psychology may be required if no improvement is seen as she continues in our clinic.  Obesity Linda Rice is currently in the action stage of change and her goal is to continue with weight loss efforts. She has agreed to follow the Category 2 plan. Linda Rice has been instructed to exercise as tolerated  (walks dog 15 minutes 4-5 times daily) for weight loss and overall health benefits. We discussed the following Behavioral Modification Strategies today: increasing lean protein intake, decreasing simple carbohydrates, increasing vegetables, increase H20 intake, and decrease liquid calories.  Deetta has agreed to follow-up with our clinic in 2 weeks. She was informed of the importance of frequent follow-up visits to maximize her success with intensive lifestyle modifications for her multiple health conditions. She was informed we would discuss her lab results at her next visit unless there is a critical issue that needs to be addressed sooner. [place] agreed to keep her next visit at the agreed upon time to discuss these results.  ALLERGIES: Allergies  Allergen Reactions  . Eggs Or Egg-Derived Products Hives    eczema Poultry Rash and GI symptoms  . Erythromycin Other (See Comments)    REACTION: stomach and hives REACTION: stomach symptoms  and hives  . Fish Allergy Other (See Comments) and Swelling    Lip swelling and eczema With fins hives   . Gatifloxacin Hives, Nausea Only and Swelling  . Other Anaphylaxis, Itching and Swelling    Legumes- all beans and peas And tree nuts  Lip swelling and eczema Legumes- all beans and peas, peanuts Orange Vegetables   . Peanut-Containing Drug Products Anaphylaxis    And tree nuts   . Poultry  Meal Itching  . Banana Itching  . Keflex [Cephalexin] Rash  . Latex Itching    MEDICATIONS: Current Outpatient Medications on File Prior to Visit  Medication Sig Dispense Refill  . albuterol (PROVENTIL HFA;VENTOLIN HFA) 108 (90 BASE) MCG/ACT inhaler Inhale 1-2 puffs into the lungs every 6 (six) hours as needed for wheezing or shortness of breath.     Marland Kitchen amLODipine (NORVASC) 5 MG tablet Take 5 mg by mouth daily.    Marland Kitchen atorvastatin (LIPITOR) 10 MG tablet Take 10 mg by mouth daily.    . cholecalciferol (VITAMIN D3) 25 MCG (1000 UT) tablet Take 1,000  Units by mouth daily.    Marland Kitchen doxycycline (VIBRAMYCIN) 100 MG capsule Take 100 mg by mouth 2 (two) times daily.  4  . fluticasone furoate-vilanterol (BREO ELLIPTA) 100-25 MCG/INH AEPB Inhale 1 puff into the lungs daily as needed (asthma).     Marland Kitchen levocetirizine (XYZAL) 5 MG tablet Take 5 mg by mouth every evening.    Marland Kitchen levothyroxine (SYNTHROID) 100 MCG tablet Take 100 mcg by mouth daily before breakfast.     . mirabegron ER (MYRBETRIQ) 25 MG TB24 tablet Take 25 mg by mouth every evening.     . naproxen sodium (ALEVE) 220 MG tablet Take 220 mg by mouth 2 (two) times daily with a meal.     . nitrofurantoin (MACRODANTIN) 100 MG capsule Take 100 mg by mouth every evening.      No current facility-administered medications on file prior to visit.     PAST MEDICAL HISTORY: Past Medical History:  Diagnosis Date  . Allergy   . Alopecia   . Alopecia   . Anxiety   . Arthritis   . Aspergillosis ([place])   . Asthma   . Atopic dermatitis   . Bladder incontinence    "I HAVE A SLIGHT PROBLEM"  . Complication of anesthesia    "I CRY WHEN I WAKE UP" 03/23/2019- "not recently"  . Depression    situational - 03/23/2019- not current  . Eczema   . Follicular cystitis   . Gallbladder problem   . History of bilateral knee replacement   . History of left hip replacement   . History of transfusion    AUTOGOLUS  . Hyperlipidemia   . Hypertension   . Hypothyroidism   . Lactose intolerance   . Multiple food allergies   . Osteoarthritis   . Osteopenia   . Pneumonia   . PVC (premature ventricular contraction)   . Shortness of breath   . Thyroid disease   . Vitamin D deficiency     PAST SURGICAL HISTORY: Past Surgical History:  Procedure Laterality Date  . APPENDECTOMY    . BREAST EXCISIONAL BIOPSY Right 1990  . BREAST SURGERY     BENIGN MASS REMOVED RT BREAST  . BROW LIFT  2020  . BROW LIFT    . CARPAL TUNNEL RELEASE    . CHOLECYSTECTOMY    . COLONOSCOPY    . COLONOSCOPY W/ POLYPECTOMY    .  COLONOSCOPY WITH PROPOFOL N/A 03/24/2019   Procedure: COLONOSCOPY WITH PROPOFOL;  Surgeon: Clarene Essex, MD;  Location: [facility];  Service: Endoscopy;  Laterality: N/A;  . EYE SURGERY  2020   LID SURGERY  . EYELID REPAIR W/ SKIN GRAFT    . JOINT REPLACEMENT Right    RT KNEE/ REVISION / LEFT KNEE   . KNEE ARTHROSCOPY Right    numerous for infection  . LEFT OOPHORECTOMY    . POLYPECTOMY  03/24/2019   Procedure: POLYPECTOMY;  Surgeon: Clarene Essex, MD;  Location: [facility];  Service: Endoscopy;;  . REPLACEMENT TOTAL KNEE BILATERAL    . TOTAL HIP ARTHROPLASTY Left 09/05/2015   Procedure: LEFT TOTAL HIP ARTHROPLASTY ANTERIOR APPROACH;  Surgeon: [name], MD;  Location: [facility];  Service: Orthopedics;  Laterality: Left;  . TUBAL LIGATION      SOCIAL HISTORY: Social History   Tobacco Use  . Smoking status: Former Smoker    Years: 4.00    Quit date: 1981    Years since quitting: 39.8  . Smokeless tobacco: Never Used  . Tobacco comment: 1 pkg a week  Substance Use Topics  . Alcohol use: Yes    Alcohol/week: 2.0 standard drinks    Types: 1 Glasses of wine, 1 Standard drinks or equivalent per week  . Drug use: No    FAMILY HISTORY: Family History  Problem Relation Age of Onset  . Diabetes Mother   . Heart disease Mother   . Hyperlipidemia Mother   . Hypertension Mother   . Stroke Mother   . Anxiety disorder Mother   . Obesity Mother   . Hyperlipidemia Father   . Heart disease Father   . Hyperlipidemia Sister   . Breast cancer Neg Hx    ROS: Review of Systems  Constitutional: Positive for malaise/fatigue.  HENT:       Positive for nose stuffiness. Positive for nasal discharge. Positive for hay fever. Positive for dentures (partial plate).  Eyes:       Positive for wearing glasses or contacts.  Respiratory: Positive for shortness of breath (with activity with asthma) and wheezing.   Cardiovascular: Negative for chest pain, palpitations and orthopnea.    Genitourinary:       Positive for frequent urination.  Skin:       Positive for skin dryness. Positive for alopecia.  Endo/Heme/Allergies: Bruises/bleeds easily.       Negative for hot/cold intolerance.   PHYSICAL EXAM: Blood pressure 120/78, pulse (!) 58, temperature 97.9 F (36.6 C), temperature source Oral, height 5\' 4"  (1.626 m), weight 213 lb (96.6 kg), SpO2 96 %. Body mass index is 36.56 kg/m. Physical Exam Vitals signs reviewed.  Constitutional:      Appearance: Normal appearance. She is well-developed. She is obese.  HENT:     Head: Normocephalic and atraumatic.     Nose: Nose normal.  Eyes:     General: No scleral icterus. Neck:     Musculoskeletal: Normal range of motion.  Cardiovascular:     Rate and Rhythm: Normal rate and regular rhythm.  Pulmonary:     Effort: Pulmonary effort is normal. No respiratory distress.  Abdominal:     Palpations: Abdomen is soft.     Tenderness: There is no abdominal tenderness.  Musculoskeletal: Normal range of motion.     Comments: Range of motion normal in all four extremities.  Skin:    General: Skin is warm and dry.  Neurological:     Mental Status: She is alert and oriented to person, place, and time.     Coordination: Coordination normal.  Psychiatric:        Mood and Affect: Mood and affect normal.        Behavior: Behavior normal.   RECENT LABS AND TESTS: BMET    Component Value Date/Time   NA 142 04/13/2019 1159   K 4.0 04/13/2019 1159   CL 102 04/13/2019 1159   CO2 22 04/13/2019 1159   GLUCOSE  99 04/13/2019 1159   GLUCOSE 130 (H) 09/07/2015 0358   BUN 14 04/13/2019 1159   CREATININE 0.62 04/13/2019 1159   CALCIUM 9.4 04/13/2019 1159   GFRNONAA 88 04/13/2019 1159   GFRAA 101 04/13/2019 1159   No results found for: HGBA1C No results found for: INSULIN CBC    Component Value Date/Time   WBC 7.8 04/13/2019 1159   WBC 13.7 (H) 09/07/2015 0358   RBC 4.86 04/13/2019 1159   RBC 3.84 (L) 09/07/2015 0358    HGB 14.1 04/13/2019 1159   HCT 43.9 04/13/2019 1159   PLT 376 04/13/2019 1159   MCV 90 04/13/2019 1159   MCH 29.0 04/13/2019 1159   MCH 29.4 09/07/2015 0358   MCHC 32.1 04/13/2019 1159   MCHC 34.0 09/07/2015 0358   RDW 13.0 04/13/2019 1159   LYMPHSABS 2.3 04/13/2019 1159   MONOABS 0.6 02/05/2008 2058   EOSABS 0.4 04/13/2019 1159   BASOSABS 0.1 04/13/2019 1159   Iron/TIBC/Ferritin/ %Sat No results found for: IRON, TIBC, FERRITIN, IRONPCTSAT Lipid Panel  No results found for: CHOL, TRIG, HDL, CHOLHDL, VLDL, LDLCALC, LDLDIRECT Hepatic Function Panel     Component Value Date/Time   PROT 7.2 04/13/2019 1159   ALBUMIN 4.3 04/13/2019 1159   AST 36 04/13/2019 1159   ALT 13 04/13/2019 1159   ALKPHOS 95 04/13/2019 1159   BILITOT 0.2 04/13/2019 1159      Component Value Date/Time   TSH 1.920 04/13/2019 1159   No results found for: Vitamin D, 25-Hydroxy  ECG on 03/23/2019 shows sinus rhythm with a rate of 86 BPM with sinus arrhtymia. Occasional PAC. Poor R-wave progression. Nonspecific ST depression.  INDIRECT CALORIMETER done today shows a VO2 of 258 and a REE of 1799. Her calculated basal metabolic rate is 5956 thus her basal metabolic rate is better than expected.  OBESITY BEHAVIORAL INTERVENTION VISIT  Today's visit was #1  Starting weight: 213 lbs Starting date: 04/13/2019 Today's weight: 213 lbs  Today's date: 04/14/2019 Total lbs lost to date: 0 At least 15 minutes were spent on discussing the following behavioral intervention visit.    04/13/2019  Height 5\' 4"  (1.626 m)  Weight 213 lb (96.6 kg)  BMI (Calculated) 36.54  BLOOD PRESSURE - SYSTOLIC 387  BLOOD PRESSURE - DIASTOLIC 78  Waist Measurement  45 inches   Body Fat % 50.1 %  RMR 1799   ASK: We discussed the diagnosis of obesity with [name] today and Linda Rice agreed to give [place] permission to discuss obesity behavioral modification therapy today.  ASSESS: Shelie has the diagnosis of obesity and  her BMI today is 36.7. Shemiah is in the action stage of change.   ADVISE: Leydy was educated on the multiple health risks of obesity as well as the benefit of weight loss to improve her health. She was advised of the need for long term treatment and the importance of lifestyle modifications to improve her current health and to decrease her risk of future health problems.  AGREE: Multiple dietary modification options and treatment options were discussed and  Marsa agreed to follow the recommendations documented in the above note.  ARRANGE: Meron was educated on the importance of frequent visits to treat obesity as outlined per CMS and USPSTF guidelines and agreed to schedule her next follow up appointment today.  IMichaelene Song, am acting as Location manager for [employer]. Juleen China, DO  I have reviewed the above documentation for accuracy and completeness, and I agree with the above. -  [name], DO

## 2019-04-13 NOTE — Progress Notes (Deleted)
.  Office: [phone]  /  Fax: [phone]   HPI:   Chief Complaint: OBESITY  Linda Rice (MR# [id number]) is a 78 y.o. female who presents on 04/13/2019 for obesity evaluation and treatment. Current BMI is Body mass index is 36.56 kg/m.Linda Rice has struggled with obesity for years and has been unsuccessful in either losing weight or maintaining long term weight loss. Linda Rice attended our information session and states she is currently in the action stage of change and ready to dedicate time achieving and maintaining a healthier weight.  Linda Rice states her desired weight loss is 30-40 pounds she skips meals frequently she is frequently drinking liquids with calories   Fatigue Linda Rice feels her energy is lower than it should be. This has worsened with weight gain and has worsened recently. Linda Rice admits to daytime somnolence and denies waking up still tired. Linda Rice is at risk for obstructive sleep apnea. Patent has a history of symptoms of daytime fatigue. Linda Rice generally gets 7 or 8 hours of sleep per night, and states they generally have nightime awakenings. Snoring is not present. Apneic episodes are not present. Epworth Sleepiness Score is 4.  Dyspnea on exertion Linda Rice notes increasing shortness of breath with exercising and seems to be worsening over time with weight gain. She notes getting out of breath sooner with activity than she used to. This has gotten worse recently. Linda Rice denies orthopnea.  Depression Screen Linda Rice (modified PHQ-9) score was  Depression screen PHQ 2/9 04/13/2019  Decreased Interest 1  Down, Depressed, Hopeless 1  PHQ - 2 Score 2  Altered sleeping 1  Tired, decreased energy 1  Change in appetite 2  Feeling bad or failure about yourself  3  Trouble concentrating 1  Moving slowly or fidgety/restless 1  Suicidal thoughts 1  PHQ-9 Score 12  Difficult doing work/chores Somewhat difficult    ASSESSMENT AND  PLAN:  Essential hypertension  Acquired hypothyroidism  Other fatigue - Plan: CBC with Differential/Platelet, Comprehensive metabolic panel, TSH, T4, free  Vitamin D deficiency - Plan: VITAMIN D 25 Hydroxy (Vit-D Deficiency, Fractures)  Hyperglycemia - Plan: Insulin, Free (Bioactive)  Food allergy - Significant issues with food and environmental allergies. Causing Linda Rice distress re: her ability to make healthy food choices. - Plan: Ambulatory referral to Allergy  Class 2 severe obesity due to excess calories with serious comorbidity and body mass index (BMI) of 36.0 to 36.9 in adult [facility])  PLAN:  Fatigue Linda Rice was informed that her fatigue may be related to obesity, depression or many other causes. Labs will be ordered, and in the meanwhile Linda Rice has agreed to work on diet, exercise and weight loss to help with fatigue. Proper sleep hygiene was discussed including the need for 7-8 hours of quality sleep each night. A sleep study was not ordered based on symptoms and Epworth score.  Dyspnea on exertion Linda Rice's shortness of breath appears to be obesity related and exercise induced. She has agreed to work on weight loss and gradually increase exercise to treat her exercise induced shortness of breath. If Linda Rice follows our instructions and loses weight without improvement of her shortness of breath, we will plan to refer to pulmonology. We will monitor this condition regularly. Linda Rice agrees to this plan.  Depression Screen Linda Rice had a moderately positive depression screening. Depression is commonly associated with obesity and often results in emotional eating behaviors. We will monitor this closely and work on CBT to help improve the non-hunger eating patterns. Referral  to Psychology may be required if no improvement is seen as she continues in our clinic.  Risk counselling ***  Obesity Linda Rice is currently in the action stage of change and her goal is to continue with  weight loss efforts She has agreed to follow the Category 2 plan Linda Rice has been instructed to work up to a goal of 150 minutes of combined cardio and strengthening exercise per week for weight loss and overall health benefits. We discussed the following Behavioral Modification Stratagies today: increasing lean protein intake and decreasing simple carbohydrates   Linda Rice has agreed to follow up with our clinic in 2 weeks. She was informed of the importance of frequent follow up visits to maximize her success with intensive lifestyle modifications for her multiple health conditions. She was informed we would discuss her lab results at her next visit unless there is a critical issue that needs to be addressed sooner. Linda Rice agreed to keep her next visit at the agreed upon time to discuss these results.  ALLERGIES: Allergies  Allergen Reactions  . Eggs Or Egg-Derived Products Hives    eczema Poultry Rash and GI symptoms  . Erythromycin Other (See Comments)    REACTION: stomach and hives REACTION: stomach symptoms  and hives  . Fish Allergy Other (See Comments) and Swelling    Lip swelling and eczema With fins hives   . Gatifloxacin Hives, Nausea Only and Swelling  . Other Anaphylaxis, Itching and Swelling    Legumes- all beans and peas And tree nuts  Lip swelling and eczema Legumes- all beans and peas, peanuts Orange Vegetables   . Peanut-Containing Drug Products Anaphylaxis    And tree nuts   . Poultry Meal Itching  . Banana Itching  . Keflex [Cephalexin] Rash  . Latex Itching    MEDICATIONS: Current Outpatient Medications on File Prior to Visit  Medication Sig Dispense Refill  . albuterol (PROVENTIL HFA;VENTOLIN HFA) 108 (90 BASE) MCG/ACT inhaler Inhale 1-2 puffs into the lungs every 6 (six) hours as needed for wheezing or shortness of breath.     Linda Rice Kitchen amLODipine (NORVASC) 5 MG tablet Take 5 mg by mouth daily.    Linda Rice Kitchen atorvastatin (LIPITOR) 10 MG tablet Take 10 mg by mouth  daily.    . cholecalciferol (VITAMIN D3) 25 MCG (1000 UT) tablet Take 1,000 Units by mouth daily.    Linda Rice Kitchen doxycycline (VIBRAMYCIN) 100 MG capsule Take 100 mg by mouth 2 (two) times daily.  4  . fluticasone furoate-vilanterol (BREO ELLIPTA) 100-25 MCG/INH AEPB Inhale 1 puff into the lungs daily as needed (asthma).     Linda Rice Kitchen levocetirizine (XYZAL) 5 MG tablet Take 5 mg by mouth every evening.    Linda Rice Kitchen levothyroxine (SYNTHROID) 100 MCG tablet Take 100 mcg by mouth daily before breakfast.     . mirabegron ER (MYRBETRIQ) 25 MG TB24 tablet Take 25 mg by mouth every evening.     . naproxen sodium (ALEVE) 220 MG tablet Take 220 mg by mouth 2 (two) times daily with a meal.     . nitrofurantoin (MACRODANTIN) 100 MG capsule Take 100 mg by mouth every evening.      No current facility-administered medications on file prior to visit.     PAST MEDICAL HISTORY: Past Medical History:  Diagnosis Date  . Allergy   . Alopecia   . Alopecia   . Anxiety   . Arthritis   . Aspergillosis ([place])   . Asthma   . Atopic dermatitis   . Bladder incontinence    "  I HAVE A SLIGHT PROBLEM"  . Complication of anesthesia    "I CRY WHEN I WAKE UP" 03/23/2019- "not recently"  . Depression    situational - 03/23/2019- not current  . Eczema   . Follicular cystitis   . Gallbladder problem   . History of bilateral knee replacement   . History of left hip replacement   . History of transfusion    AUTOGOLUS  . Hyperlipidemia   . Hypertension   . Hypothyroidism   . Lactose intolerance   . Multiple food allergies   . Osteoarthritis   . Osteopenia   . Pneumonia   . PVC (premature ventricular contraction)   . Shortness of breath   . Thyroid disease   . Vitamin D deficiency     PAST SURGICAL HISTORY: Past Surgical History:  Procedure Laterality Date  . APPENDECTOMY    . BREAST EXCISIONAL BIOPSY Right 1990  . BREAST SURGERY     BENIGN MASS REMOVED RT BREAST  . BROW LIFT  2020  . BROW LIFT    . CARPAL TUNNEL RELEASE     . CHOLECYSTECTOMY    . COLONOSCOPY    . COLONOSCOPY W/ POLYPECTOMY    . COLONOSCOPY WITH PROPOFOL N/A 03/24/2019   Procedure: COLONOSCOPY WITH PROPOFOL;  Surgeon: Clarene Essex, MD;  Location: [place];  Service: Endoscopy;  Laterality: N/A;  . EYE SURGERY  2020   LID SURGERY  . EYELID REPAIR W/ SKIN GRAFT    . JOINT REPLACEMENT Right    RT KNEE/ REVISION / LEFT KNEE   . KNEE ARTHROSCOPY Right    numerous for infection  . LEFT OOPHORECTOMY    . POLYPECTOMY  03/24/2019   Procedure: POLYPECTOMY;  Surgeon: Clarene Essex, MD;  Location: [place];  Service: Endoscopy;;  . REPLACEMENT TOTAL KNEE BILATERAL    . TOTAL HIP ARTHROPLASTY Left 09/05/2015   Procedure: LEFT TOTAL HIP ARTHROPLASTY ANTERIOR APPROACH;  Surgeon: [name], MD;  Location: [facility];  Service: Orthopedics;  Laterality: Left;  . TUBAL LIGATION      SOCIAL HISTORY: Social History   Tobacco Use  . Smoking status: Former Smoker    Years: 4.00    Quit date: 1981    Years since quitting: 39.8  . Smokeless tobacco: Never Used  . Tobacco comment: 1 pkg a week  Substance Use Topics  . Alcohol use: Yes    Alcohol/week: 2.0 standard drinks    Types: 1 Glasses of wine, 1 Standard drinks or equivalent per week  . Drug use: No    FAMILY HISTORY: Family History  Problem Relation Age of Onset  . Diabetes Mother   . Heart disease Mother   . Hyperlipidemia Mother   . Hypertension Mother   . Stroke Mother   . Anxiety disorder Mother   . Obesity Mother   . Hyperlipidemia Father   . Heart disease Father   . Hyperlipidemia Sister   . Breast cancer Neg Hx     ROS: ROS  PHYSICAL EXAM: Blood pressure 120/78, pulse (!) 58, temperature 97.9 F (36.6 C), temperature source Oral, height 5\' 4"  (1.626 m), weight 213 lb (96.6 kg), SpO2 96 %. Body mass index is 36.56 kg/m.   Physical Exam Vitals signs and nursing note reviewed.  Constitutional:      Appearance: Normal appearance.  HENT:     Head: Normocephalic  and atraumatic.     Nose: Nose normal.  Eyes:     Extraocular Movements: Extraocular movements intact.  Conjunctiva/sclera: Conjunctivae normal.  Neck:     Musculoskeletal: Normal range of motion and neck supple.  Cardiovascular:     Rate and Rhythm: Normal rate and regular rhythm.  Pulmonary:     Effort: Pulmonary effort is normal.  Musculoskeletal:     Right lower leg: No edema.     Left lower leg: No edema.  Neurological:     General: No focal deficit present.     Mental Status: She is alert.  Psychiatric:        Rice and Affect: Rice normal.        Behavior: Behavior normal.        Thought Content: Thought content normal.     RECENT LABS AND TESTS: BMET    Component Value Date/Time   NA 140 09/07/2015 0358   K 4.4 09/07/2015 0358   CL 110 09/07/2015 0358   CO2 22 09/07/2015 0358   GLUCOSE 130 (H) 09/07/2015 0358   BUN 17 09/07/2015 0358   CREATININE 0.73 09/07/2015 0358   CALCIUM 8.9 09/07/2015 0358   GFRNONAA >60 09/07/2015 0358   GFRAA >60 09/07/2015 0358   No results found for: HGBA1C No results found for: INSULIN CBC    Component Value Date/Time   WBC 13.7 (H) 09/07/2015 0358   RBC 3.84 (L) 09/07/2015 0358   HGB 11.3 (L) 09/07/2015 0358   HCT 33.2 (L) 09/07/2015 0358   PLT 227 09/07/2015 0358   MCV 86.5 09/07/2015 0358   MCH 29.4 09/07/2015 0358   MCHC 34.0 09/07/2015 0358   RDW 13.6 09/07/2015 0358   LYMPHSABS 2.9 02/05/2008 2058   MONOABS 0.6 02/05/2008 2058   EOSABS 0.6 02/05/2008 2058   BASOSABS 0.0 02/05/2008 2058   Iron/TIBC/Ferritin/ %Sat No results found for: IRON, TIBC, FERRITIN, IRONPCTSAT Lipid Panel  No results found for: CHOL, TRIG, HDL, CHOLHDL, VLDL, LDLCALC, LDLDIRECT Hepatic Function Panel     Component Value Date/Time   PROT 7.1 08/26/2015 1110   ALBUMIN 4.1 08/26/2015 1110   AST 42 (H) 08/26/2015 1110   ALT 16 08/26/2015 1110   ALKPHOS 74 08/26/2015 1110   BILITOT 0.5 08/26/2015 1110      Component Value Date/Time    TSH 2.924 02/05/2008 2058   ECG from last month reviewed. NSR with PAC. INDIRECT CALORIMETER done today shows a VO2 of 258 and a REE of 1799. Her calculated basal metabolic rate is 0277 thus her basal metabolic rate is better than expected.     OBESITY BEHAVIORAL INTERVENTION VISIT  Today's visit was # 1   Starting weight: 213 pounds Starting date: 04/13/2019  Today's weight : Weight: 213 lb (96.6 kg)  Today's date: 04/13/2019 At least 15 minutes were spent on discussing the following behavioral intervention visit.  ASK: We discussed the diagnosis of obesity with Linda Rice today and Linda Rice agreed to give [place] permission to discuss obesity behavioral modification therapy today.  ASSESS: Linda Rice has the diagnosis of obesity and her BMI today is @TBMI @ Linda Rice is in the action stage of change   ADVISE: Linda Rice was educated on the multiple health risks of obesity as well as the benefit of weight loss to improve her health. She was advised of the need for long term treatment and the importance of lifestyle modifications to improve her current health and to decrease her risk of future health problems.  AGREE: Multiple dietary modification options and treatment options were discussed and  Markia agreed to follow the recommendations documented in the above  note.  ARRANGE: Lurae was educated on the importance of frequent visits to treat obesity as outlined per CMS and USPSTF guidelines and agreed to schedule her next follow up appointment today.

## 2019-04-14 ENCOUNTER — Encounter (INDEPENDENT_AMBULATORY_CARE_PROVIDER_SITE_OTHER): Payer: Self-pay | Admitting: Family Medicine

## 2019-04-14 LAB — COMPREHENSIVE METABOLIC PANEL
ALT: 13 IU/L (ref 0–32)
AST: 36 IU/L (ref 0–40)
Albumin/Globulin Ratio: 1.5 (ref 1.2–2.2)
Albumin: 4.3 g/dL (ref 3.7–4.7)
Alkaline Phosphatase: 95 IU/L (ref 39–117)
BUN/Creatinine Ratio: 23 (ref 12–28)
BUN: 14 mg/dL (ref 8–27)
Bilirubin Total: 0.2 mg/dL (ref 0.0–1.2)
CO2: 22 mmol/L (ref 20–29)
Calcium: 9.4 mg/dL (ref 8.7–10.3)
Chloride: 102 mmol/L (ref 96–106)
Creatinine, Ser: 0.62 mg/dL (ref 0.57–1.00)
GFR calc Af Amer: 101 mL/min/{1.73_m2} (ref >59)
GFR calc non Af Amer: 88 mL/min/{1.73_m2} (ref >59)
Globulin, Total: 2.9 g/dL (ref 1.5–4.5)
Glucose: 99 mg/dL (ref 65–99)
Potassium: 4 mmol/L (ref 3.5–5.2)
Sodium: 142 mmol/L (ref 134–144)
Total Protein: 7.2 g/dL (ref 6.0–8.5)

## 2019-04-14 LAB — CBC WITH DIFFERENTIAL/PLATELET
Basophils Absolute: 0.1 10*3/uL (ref 0.0–0.2)
Basos: 1 %
EOS (ABSOLUTE): 0.4 10*3/uL (ref 0.0–0.4)
Eos: 5 %
Hematocrit: 43.9 % (ref 34.0–46.6)
Hemoglobin: 14.1 g/dL (ref 11.1–15.9)
Immature Grans (Abs): 0 10*3/uL (ref 0.0–0.1)
Immature Granulocytes: 0 %
Lymphocytes Absolute: 2.3 10*3/uL (ref 0.7–3.1)
Lymphs: 29 %
MCH: 29 pg (ref 26.6–33.0)
MCHC: 32.1 g/dL (ref 31.5–35.7)
MCV: 90 fL (ref 79–97)
Monocytes Absolute: 0.6 10*3/uL (ref 0.1–0.9)
Monocytes: 8 %
Neutrophils Absolute: 4.5 10*3/uL (ref 1.4–7.0)
Neutrophils: 57 %
Platelets: 376 10*3/uL (ref 150–450)
RBC: 4.86 x10E6/uL (ref 3.77–5.28)
RDW: 13 % (ref 11.7–15.4)
WBC: 7.8 10*3/uL (ref 3.4–10.8)

## 2019-04-14 LAB — T4, FREE: Free T4: 1.75 ng/dL (ref 0.82–1.77)

## 2019-04-14 LAB — VITAMIN D 25 HYDROXY (VIT D DEFICIENCY, FRACTURES): Vit D, 25-Hydroxy: 38.5 ng/mL (ref 30.0–100.0)

## 2019-04-14 LAB — TSH: TSH: 1.92 u[IU]/mL (ref 0.450–4.500)

## 2019-04-14 NOTE — Telephone Encounter (Signed)
Please advise 

## 2019-04-20 ENCOUNTER — Other Ambulatory Visit: Payer: Self-pay

## 2019-04-20 ENCOUNTER — Encounter: Payer: Self-pay | Admitting: Pediatrics

## 2019-04-20 ENCOUNTER — Ambulatory Visit (INDEPENDENT_AMBULATORY_CARE_PROVIDER_SITE_OTHER): Payer: Medicare Other | Admitting: Pediatrics

## 2019-04-20 VITALS — BP 132/80 | HR 86 | Temp 98.5°F | Resp 20 | Ht 63.0 in | Wt 213.0 lb

## 2019-04-20 DIAGNOSIS — J301 Allergic rhinitis due to pollen: Secondary | ICD-10-CM

## 2019-04-20 DIAGNOSIS — J4520 Mild intermittent asthma, uncomplicated: Secondary | ICD-10-CM | POA: Diagnosis not present

## 2019-04-20 DIAGNOSIS — T7800XD Anaphylactic reaction due to unspecified food, subsequent encounter: Secondary | ICD-10-CM

## 2019-04-20 DIAGNOSIS — L630 Alopecia (capitis) totalis: Secondary | ICD-10-CM | POA: Diagnosis not present

## 2019-04-20 DIAGNOSIS — I499 Cardiac arrhythmia, unspecified: Secondary | ICD-10-CM | POA: Diagnosis not present

## 2019-04-20 DIAGNOSIS — R011 Cardiac murmur, unspecified: Secondary | ICD-10-CM

## 2019-04-20 DIAGNOSIS — M8589 Other specified disorders of bone density and structure, multiple sites: Secondary | ICD-10-CM | POA: Diagnosis not present

## 2019-04-20 DIAGNOSIS — L2089 Other atopic dermatitis: Secondary | ICD-10-CM | POA: Diagnosis not present

## 2019-04-20 MED ORDER — FLOVENT HFA 110 MCG/ACT IN AERO: INHALATION_SPRAY | RESPIRATORY_TRACT | 3 refills | Status: DC

## 2019-04-20 MED ORDER — EPINEPHRINE 0.3 MG/0.3ML IJ SOAJ: 0.3000 mg | INTRAMUSCULAR | 1 refills | Status: DC | PRN

## 2019-04-20 MED ORDER — FLUTICASONE PROPIONATE 50 MCG/ACT NA SUSP: 2.0000 | Freq: Every day | NASAL | 5 refills | Status: DC | PRN

## 2019-04-20 MED ORDER — MONTELUKAST SODIUM 10 MG PO TABS: ORAL_TABLET | ORAL | 5 refills | Status: DC

## 2019-04-20 NOTE — Progress Notes (Signed)
[place] [address] Dept: [phone]  New Patient Note  Patient ID: Linda Rice, female    DOB: 04-20-1941  Age: 78 y.o. MRN: [id number] Date of Office Visit: 04/20/2019 Referring provider: [name], DO [address] [place],  [place] [id number]    Chief Complaint: Food Intolerance, Asthma, and Eczema  HPI Linda Rice presents for an allergy evaluation because of food allergies, eczema and asthma.  Her asthma is controlled by using Ventolin 2 puffs every 4 hours but if she needs Ventolin daily she has Breo Ellipta 101 puff every 24 hours.  We should keep in mind that for many years she has had an irregular heartbeat and at times PVCs.  She has never had atrial fibrillation according to her.  She has had frequent courses of steroids in the past.  She was treated for pulmonary aspergillosis with itraconazole.  She did not have  ABPA  She avoids all poultry including eggs, fish , both salt water fresh water, tree nuts, legumes including peanuts, bananas and orange.  If she eats peaches with the peel ,she has itching of her mouth.. She has had food allergy since infancy and eczema since infancy  She has been followed by Dr. Wilhemina Bonito for her eczema.  She has been on steroids for a long time and has had lens implants as a result.  She has alopecia totalis which began about 40 years ago.  She has had a long-term history of needing steroids to control her eczema  She has had seasonal allergic rhinitis since childhood.  She has aggravation of her symptoms on exposure to dust, cigarette smoke, cats and dogs.  She is on Xyzal 5 mg once a day to help with her allergic symptoms  Review of Systems  Constitutional: Negative.   HENT:       Seasonal allergic rhinitis since early childhood  Eyes:       Lens implants secondary to steroid use  Respiratory:       Asthma since early childhood.  Pneumonia 2 or 3 times as a child but none since   Cardiovascular:       Irregular heartbeat with occasional PVCs.  She denies atrial fibrillation.  Hypertension  Gastrointestinal:       Cholecystectomy and appendectomy  Genitourinary:       Follicular cystitis.  Removal of her left ovary  Musculoskeletal:       Osteoarthritis .  Total knee replacements  Skin:       Eczema since infancy.  Itching if she gets overheated.  Alopecia totalis  Neurological: Negative.   Endo/Heme/Allergies:       No diabetes.  Low thyroid function  Psychiatric/Behavioral: Negative.     Outpatient Encounter Medications as of 04/20/2019  Medication Sig  . albuterol (PROVENTIL HFA;VENTOLIN HFA) 108 (90 BASE) MCG/ACT inhaler Inhale 1-2 puffs into the lungs every 6 (six) hours as needed for wheezing or shortness of breath.   Marland Kitchen amLODipine (NORVASC) 5 MG tablet Take 5 mg by mouth daily.  Marland Kitchen atorvastatin (LIPITOR) 10 MG tablet Take 10 mg by mouth daily.  . cholecalciferol (VITAMIN D3) 25 MCG (1000 UT) tablet Take 1,000 Units by mouth daily.  Marland Kitchen doxycycline (VIBRAMYCIN) 100 MG capsule Take 100 mg by mouth 2 (two) times daily.  . fluticasone furoate-vilanterol (BREO ELLIPTA) 100-25 MCG/INH AEPB Inhale 1 puff into the lungs daily as needed (asthma).   Marland Kitchen levocetirizine (XYZAL) 5 MG tablet Take  5 mg by mouth every evening.  Marland Kitchen levothyroxine (SYNTHROID) 100 MCG tablet Take 100 mcg by mouth daily before breakfast.   . mirabegron ER (MYRBETRIQ) 25 MG TB24 tablet Take 25 mg by mouth every evening.   . naproxen sodium (ALEVE) 220 MG tablet Take 220 mg by mouth 2 (two) times daily with a meal.   . nitrofurantoin (MACRODANTIN) 100 MG capsule Take 100 mg by mouth every evening.   Marland Kitchen EPINEPHrine 0.3 mg/0.3 mL IJ SOAJ injection Inject 0.3 mLs (0.3 mg total) into the muscle as needed for anaphylaxis.  . fluticasone (FLONASE) 50 MCG/ACT nasal spray Place 2 sprays into both nostrils daily as needed (stuffy nose).  . fluticasone (FLOVENT HFA) 110 MCG/ACT inhaler If you need Ventolin more  than 2 days/week start Flovent. 2 puffs every 12 hours. Stop when you are cough & wheeze free.  . montelukast (SINGULAIR) 10 MG tablet 1 tablet once a day to prevent coughing or wheezing   No facility-administered encounter medications on file as of 04/20/2019.      Drug Allergies:  Allergies  Allergen Reactions  . Eggs Or Egg-Derived Products Hives    eczema Poultry Rash and GI symptoms  . Erythromycin Other (See Comments)    REACTION: stomach and hives REACTION: stomach symptoms  and hives  . Fish Allergy Other (See Comments) and Swelling    Lip swelling and eczema With fins hives   . Gatifloxacin Hives, Nausea Only and Swelling  . Other Anaphylaxis, Itching and Swelling    Legumes- all beans and peas And tree nuts  Lip swelling and eczema Legumes- all beans and peas, peanuts Orange Vegetables   . Peanut-Containing Drug Products Anaphylaxis    And tree nuts   . Poultry Meal Itching  . Banana Itching  . Keflex [Cephalexin] Rash  . Latex Itching    Family History: Debie's family history includes Allergic rhinitis in her father; Anxiety disorder in her mother; Asthma in her maternal grandfather; Diabetes in her mother; Heart disease in her father and mother; Hyperlipidemia in her father, mother, and sister; Hypertension in her mother; Obesity in her mother; Stroke in her mother..  Family history is positive for eczema in a grandchild.  and food allergies.  Family history is negative for sinus problems, angioedema, chronic hives, lupus, chronic bronchitis or emphysema.  Social and environmental.  She lives in an home by herself in a cottage.  She has a golden labradoodle..  She is not exposed to cigarette smoking.  She smoked for 2 years in the past averaging about a pack per week  Physical Exam: BP 132/80 (BP Location: Left Arm, Patient Position: Sitting, Cuff Size: Large)   Pulse 86   Temp 98.5 F (36.9 C) (Temporal)   Resp 20   Ht 5\' 3"  (1.6 m)   Wt 213 lb (96.6 kg)    SpO2 98%   BMI 37.73 kg/m    Physical Exam Vitals signs reviewed.  Constitutional:      Appearance: Normal appearance. She is obese.  HENT:     Head:     Comments: Eyes normal.  Ears normal.  Nose normal.  Pharynx normal. Neck:     Musculoskeletal: Neck supple.     Comments: No thyromegaly Cardiovascular:     Rate and Rhythm: Rhythm irregular.     Comments: S1-S2 normal.  She had a grade 2/6 systolic ejection murmur best heard in the aortic valve area Pulmonary:     Comments: Clear to percussion and auscultation  Abdominal:     Palpations: Abdomen is soft.     Tenderness: There is no abdominal tenderness.     Comments: No hepatosplenomegaly  Lymphadenopathy:     Cervical: No cervical adenopathy.  Skin:    Comments: Eczematoid changes on her face.  Alopecia totalis  Neurological:     General: No focal deficit present.     Mental Status: She is alert and oriented to person, place, and time. Mental status is at baseline.  Psychiatric:        Mood and Affect: Mood normal.        Behavior: Behavior normal.        Thought Content: Thought content normal.        Judgment: Judgment normal.     Diagnostics: FVC 2.06 L FEV1 1.49 L.  Predicted FVC 2.65 L predicted FEV1 1.98 L.  After albuterol 2 puffs FVC 2.35 L FEV1 1.70 L-this shows a mild reduction in the forced vital capacity.  The FEV1 did improve 14%.  Allergy skin test were extremely positive to peanuts and tree nuts.  No significant testing to a variety of fish, egg white , [place], chicken.. She had positive skin test to grass pollens, birch pollen dust mite, cat, dog.  Milder reactivity to ragweed   Assessment  Assessment and Plan: 1. Anaphylactic reaction due to food, subsequent encounter   2. Mild intermittent asthma without complication   3. Seasonal allergic rhinitis due to pollen   4. Irregular heartbeat   5. Heart murmur, systolic   6. Alopecia totalis   7. Flexural atopic dermatitis   8. Osteopenia of  multiple sites     Meds ordered this encounter  Medications  . montelukast (SINGULAIR) 10 MG tablet    Sig: 1 tablet once a day to prevent coughing or wheezing    Dispense:  30 tablet    Refill:  5  . fluticasone (FLONASE) 50 MCG/ACT nasal spray    Sig: Place 2 sprays into both nostrils daily as needed (stuffy nose).    Dispense:  16 g    Refill:  5  . EPINEPHrine 0.3 mg/0.3 mL IJ SOAJ injection    Sig: Inject 0.3 mLs (0.3 mg total) into the muscle as needed for anaphylaxis.    Dispense:  2 each    Refill:  1    Please dispense Mylan brand generic only. Thank you.  . fluticasone (FLOVENT HFA) 110 MCG/ACT inhaler    Sig: If you need Ventolin more than 2 days/week start Flovent. 2 puffs every 12 hours. Stop when you are cough & wheeze free.    Dispense:  1 Inhaler    Refill:  3    Patient Instructions  Environmental control of dust mite Xyzal 5 mg-take 1 tablet once a day if needed for runny nose or itchy eyes or itching Fluticasone 2 sprays per nostril once a day if needed for stuffy nose Opcon-A-1 drop 3 times a day if needed for itchy eyes  Montelukast 10 mg-take 1 tablet once a day to prevent coughing or wheezing Ventolin 2 puffs every 4 hours if needed for wheezing or coughing spells.  You may use Ventolin 2 puffs 5 to 15 minutes before exercise If you need Ventolin more than 2 days/week, start on Flovent 110-2 puffs every 12 hours then stop when you are cough and wheeze free  Avoid peanuts and tree nuts and until I get the results of your blood work, avoid egg, chicken, [place] and fish.  If you have an allergic reaction take Benadryl 50 mg every 4 hours and if you have life-threatening symptoms inject with EpiPen 0.3 mg I gave you a list of foods associated with the oral allergy syndrome  Take calcium citrate 500 mg 3 times a day to help with your osteopenia Continue on the treatment plan of your eczema prescribed by your dermatologist Call us if you are not doing well on  this treatment plan   Return in about 4 weeks (around 05/18/2019).   Thank you for the opportunity to care for this patient.  Please do not hesitate to contact me with questions.  Penne Lash, M.D.  [facility] [address] [place], [place] [address] [phone]

## 2019-04-20 NOTE — Patient Instructions (Addendum)
Environmental control of dust mite Xyzal 5 mg-take 1 tablet once a day if needed for runny nose or itchy eyes or itching Fluticasone 2 sprays per nostril once a day if needed for stuffy nose Opcon-A-1 drop 3 times a day if needed for itchy eyes  Montelukast 10 mg-take 1 tablet once a day to prevent coughing or wheezing Ventolin 2 puffs every 4 hours if needed for wheezing or coughing spells.  You may use Ventolin 2 puffs 5 to 15 minutes before exercise If you need Ventolin more than 2 days/week, start on Flovent 110-2 puffs every 12 hours then stop when you are cough and wheeze free  Avoid peanuts and tree nuts and until I get the results of your blood work, avoid egg, chicken, [place] and fish.  If you have an allergic reaction take Benadryl 50 mg every 4 hours and if you have life-threatening symptoms inject with EpiPen 0.3 mg I gave you a list of foods associated with the oral allergy syndrome  Take calcium citrate 500 mg 3 times a day to help with your osteopenia Continue on the treatment plan of your eczema prescribed by your dermatologist Call us if you are not doing well on this treatment plan

## 2019-04-22 LAB — ALLERGEN PROFILE, FOOD-FISH
Allergen Mackerel IgE: <0.1 kU/L
Allergen Salmon IgE: <0.1 kU/L
Allergen Trout IgE: <0.1 kU/L
Allergen Walley Pike IgE: <0.1 kU/L
Codfish IgE: <0.1 kU/L
Halibut IgE: <0.1 kU/L
Tuna: 0.15 kU/L — AB

## 2019-04-22 LAB — ALLERGEN, CHICKEN F83: Chicken IgE: 0.24 kU/L — AB

## 2019-04-22 LAB — EGG COMPONENT PANEL
F232-IgE Ovalbumin: <0.1 kU/L
F233-IgE Ovomucoid: 0.24 kU/L — AB

## 2019-04-22 LAB — ALLERGEN EGG WHITE F1: Egg White IgE: 3.47 kU/L — AB

## 2019-04-22 LAB — ALLERGEN, TURKEY, F284: Allergen Turkey IgE: 0.18 kU/L — AB

## 2019-04-24 ENCOUNTER — Telehealth: Payer: Self-pay

## 2019-04-24 NOTE — Telephone Encounter (Signed)
My chart message from pt: I've been awaiting your call with the results of the [facility] blood tests with both curiosity and nervous anticipation...  You had mentioned that you would call me when the results crossed your desk and I ask your forgiveness for my impatience but I am seeing Dr. Juleen China on Monday to plan my diet for the coming weeks anti would be so helpful to know what I might be able to add.  Will the results be added to this Patient Portal when you have seen them?  With many thanks, Linda Rice   SPoke with pt and informed her you are out of clinic until Monday and that you would look over lab results when back in clinic and you or a CMA would call her with he results.

## 2019-04-27 ENCOUNTER — Other Ambulatory Visit: Payer: Self-pay

## 2019-04-27 ENCOUNTER — Ambulatory Visit (INDEPENDENT_AMBULATORY_CARE_PROVIDER_SITE_OTHER): Payer: Medicare Other | Admitting: Family Medicine

## 2019-04-27 VITALS — BP 143/92 | HR 56 | Temp 97.6°F | Ht 64.0 in | Wt 209.0 lb

## 2019-04-27 DIAGNOSIS — R7303 Prediabetes: Secondary | ICD-10-CM

## 2019-04-27 DIAGNOSIS — Z91018 Allergy to other foods: Secondary | ICD-10-CM | POA: Diagnosis not present

## 2019-04-27 DIAGNOSIS — R262 Difficulty in walking, not elsewhere classified: Secondary | ICD-10-CM

## 2019-04-27 DIAGNOSIS — Z6835 Body mass index (BMI) 35.0-35.9, adult: Secondary | ICD-10-CM | POA: Diagnosis not present

## 2019-04-27 DIAGNOSIS — E039 Hypothyroidism, unspecified: Secondary | ICD-10-CM

## 2019-04-27 DIAGNOSIS — E559 Vitamin D deficiency, unspecified: Secondary | ICD-10-CM | POA: Diagnosis not present

## 2019-04-27 NOTE — Telephone Encounter (Signed)
Results discussed with the patient this morning

## 2019-04-28 ENCOUNTER — Other Ambulatory Visit (INDEPENDENT_AMBULATORY_CARE_PROVIDER_SITE_OTHER): Payer: Self-pay | Admitting: Family Medicine

## 2019-04-28 DIAGNOSIS — E559 Vitamin D deficiency, unspecified: Secondary | ICD-10-CM

## 2019-04-28 LAB — HEMOGLOBIN A1C
Est. average glucose Bld gHb Est-mCnc: 114 mg/dL
Hgb A1c MFr Bld: 5.6 % (ref 4.8–5.6)

## 2019-04-28 LAB — INSULIN, RANDOM: INSULIN: 13.3 u[IU]/mL (ref 2.6–24.9)

## 2019-04-28 MED ORDER — VITAMIN D (ERGOCALCIFEROL) 1.25 MG (50000 UNIT) PO CAPS: 50000.0000 [IU] | ORAL_CAPSULE | ORAL | 0 refills | Status: DC

## 2019-04-29 ENCOUNTER — Encounter (INDEPENDENT_AMBULATORY_CARE_PROVIDER_SITE_OTHER): Payer: Self-pay | Admitting: Family Medicine

## 2019-04-29 NOTE — Progress Notes (Signed)
Office: [phone]  /  Fax: [phone]   HPI:   Chief Complaint: OBESITY Joniah is here to discuss her progress with her obesity treatment plan. She is on the Category 2 plan and is following her eating plan approximately 98 % of the time. She states she is walking the dog for 30-40 minutes 7 times per week. Sundeep is doing well. She had a visit with a Allergist. She has positive allergies to egg. She was offered a food challenge but she is not sure that she would like to do it.  Her weight is 209 lb (94.8 kg) today and has had a weight loss of 4 pounds over a period of 2 weeks since her last visit. She has lost 4 lbs since starting treatment with [place].  Vitamin D Deficiency Manisha has a diagnosis of vitamin D deficiency. She is currently taking OTC Vit D and denies nausea, vomiting or muscle weakness.  Hypothyroidism Loralei has a diagnosis of hypothyroidism. Last labs were within normal limits. She is currently on levothyroxine. She denies hot or cold intolerance or palpitations, but does admit to ongoing fatigue.  Pre-Diabetes Kamron has a diagnosis of pre-diabetes based on her elevated Hgb A1c and was informed this puts her at greater risk of developing diabetes. She is not taking metformin currently and continues to work on diet and exercise to decrease risk of diabetes. She denies nausea or hypoglycemia.  Food Allergies Gabbrielle has food allergies. She is now followed by an [place] notes trouble walking. Will refer to PT at her retirement home. See letter.  ASSESSMENT AND PLAN:  Vitamin D deficiency - Plan: Vitamin D, Ergocalciferol, (DRISDOL) 1.25 MG (50000 UT) CAPS capsule  Prediabetes - Plan: HgB A1c, Insulin, random  Multiple food allergies  Acquired hypothyroidism  Trouble walking  Class 2 severe obesity with serious comorbidity and body mass index (BMI) of 35.0 to 35.9 in adult, unspecified obesity type ([place])  PLAN:  Vitamin D  Deficiency Mazzie was informed that low vitamin D levels contributes to fatigue and are associated with obesity, breast, and colon cancer. Celes agrees to start prescription Vit D 50,000 IU every week #4 with no refills. She will follow up for routine testing of vitamin D, at least 2-3 times per year. She was informed of the risk of over-replacement of vitamin D and agrees to not increase her dose unless she discusses this with [place] first. Ritta agrees to follow up with our clinic in 2 to 3 weeks.  Hypothyroidism Carmon was informed of the importance of good thyroid control to help with weight loss efforts. She was also informed that supertheraputic thyroid levels are dangerous and will not improve weight loss results.   Pre-Diabetes Krystale will continue to work on weight loss, exercise, and decreasing simple carbohydrates in her diet to help decrease the risk of diabetes. We dicussed metformin including benefits and risks. She was informed that eating too many simple carbohydrates or too many calories at one sitting increases the likelihood of GI side effects. We will check A1c and insulin level today. Teigan agrees to follow up with [place] as directed to monitor her progress.  Food Allergies Wilmina will follow up with her Allergist, and she agrees to follow up with our clinic in 2 to 3 weeks.  Gail has a physical therapy referral at [place] and will need letter (MyChart).  Obesity Eliyah is currently in the action stage of change. As such, her goal is to  continue with weight loss efforts She has agreed to follow the Category 2 plan Oliviah has been instructed to work up to a goal of 150 minutes of combined cardio and strengthening exercise per week or as tolerated for weight loss and overall health benefits. We discussed the following Behavioral Modification Strategies today: increasing lean protein intake, decreasing simple carbohydrates, increasing vegetables,  increase H20 intake, work on meal planning and easy cooking plans, holiday eating strategies  and decrease liquid calories   Lajean has agreed to follow up with our clinic in 2 to 3 weeks. She was informed of the importance of frequent follow up visits to maximize her success with intensive lifestyle modifications for her multiple health conditions.  ALLERGIES: Allergies  Allergen Reactions  . Eggs Or Egg-Derived Products Hives    eczema Poultry Rash and GI symptoms  . Erythromycin Other (See Comments)    REACTION: stomach and hives REACTION: stomach symptoms  and hives  . Fish Allergy Other (See Comments) and Swelling    Lip swelling and eczema With fins hives   . Gatifloxacin Hives, Nausea Only and Swelling  . Other Anaphylaxis, Itching and Swelling    Legumes- all beans and peas And tree nuts  Lip swelling and eczema Legumes- all beans and peas, peanuts Orange Vegetables   . Peanut-Containing Drug Products Anaphylaxis    And tree nuts   . Poultry Meal Itching  . Banana Itching  . Keflex [Cephalexin] Rash  . Latex Itching    MEDICATIONS: Current Outpatient Medications on File Prior to Visit  Medication Sig Dispense Refill  . albuterol (PROVENTIL HFA;VENTOLIN HFA) 108 (90 BASE) MCG/ACT inhaler Inhale 1-2 puffs into the lungs every 6 (six) hours as needed for wheezing or shortness of breath.     Marland Kitchen amLODipine (NORVASC) 5 MG tablet Take 5 mg by mouth daily.    Marland Kitchen atorvastatin (LIPITOR) 10 MG tablet Take 10 mg by mouth daily.    Marland Kitchen CALCIUM CITRATE PO Take 500 mg by mouth 3 (three) times daily.    . cholecalciferol (VITAMIN D3) 25 MCG (1000 UT) tablet Take 1,000 Units by mouth daily.    Marland Kitchen doxycycline (VIBRAMYCIN) 100 MG capsule Take 100 mg by mouth 2 (two) times daily.  4  . EPINEPHrine 0.3 mg/0.3 mL IJ SOAJ injection Inject 0.3 mLs (0.3 mg total) into the muscle as needed for anaphylaxis. 2 each 1  . fluticasone (FLONASE) 50 MCG/ACT nasal spray Place 2 sprays into both  nostrils daily as needed (stuffy nose). 16 g 5  . fluticasone (FLOVENT HFA) 110 MCG/ACT inhaler If you need Ventolin more than 2 days/week start Flovent. 2 puffs every 12 hours. Stop when you are cough & wheeze free. 1 Inhaler 3  . fluticasone furoate-vilanterol (BREO ELLIPTA) 100-25 MCG/INH AEPB Inhale 1 puff into the lungs daily as needed (asthma).     Marland Kitchen levocetirizine (XYZAL) 5 MG tablet Take 5 mg by mouth every evening.    Marland Kitchen levothyroxine (SYNTHROID) 100 MCG tablet Take 100 mcg by mouth daily before breakfast.     . mirabegron ER (MYRBETRIQ) 25 MG TB24 tablet Take 25 mg by mouth every evening.     . montelukast (SINGULAIR) 10 MG tablet 1 tablet once a day to prevent coughing or wheezing 30 tablet 5  . naproxen sodium (ALEVE) 220 MG tablet Take 220 mg by mouth 2 (two) times daily with a meal.     . nitrofurantoin (MACRODANTIN) 100 MG capsule Take 100 mg by mouth every evening.  No current facility-administered medications on file prior to visit.     PAST MEDICAL HISTORY: Past Medical History:  Diagnosis Date  . Allergy   . Alopecia   . Alopecia   . Anxiety   . Arthritis   . Aspergillosis ([place])   . Asthma   . Atopic dermatitis   . Bladder incontinence    "I HAVE A SLIGHT PROBLEM"  . Complication of anesthesia    "I CRY WHEN I WAKE UP" 03/23/2019- "not recently"  . Depression    situational - 03/23/2019- not current  . Eczema   . Follicular cystitis   . Gallbladder problem   . History of bilateral knee replacement   . History of left hip replacement   . History of transfusion    AUTOGOLUS  . Hyperlipidemia   . Hypertension   . Hypothyroidism   . Lactose intolerance   . Multiple food allergies   . Osteoarthritis   . Osteopenia   . Pneumonia   . PVC (premature ventricular contraction)   . Shortness of breath   . Thyroid disease   . Urticaria   . Vitamin D deficiency     PAST SURGICAL HISTORY: Past Surgical History:  Procedure Laterality Date  . ADENOIDECTOMY     . APPENDECTOMY    . BREAST EXCISIONAL BIOPSY Right 1990  . BREAST SURGERY     BENIGN MASS REMOVED RT BREAST  . BROW LIFT  2020  . BROW LIFT    . CARPAL TUNNEL RELEASE    . CHOLECYSTECTOMY    . COLONOSCOPY    . COLONOSCOPY W/ POLYPECTOMY    . COLONOSCOPY WITH PROPOFOL N/A 03/24/2019   Procedure: COLONOSCOPY WITH PROPOFOL;  Surgeon: Clarene Essex, MD;  Location: [facility];  Service: Endoscopy;  Laterality: N/A;  . EYE SURGERY  2020   LID SURGERY  . EYELID REPAIR W/ SKIN GRAFT    . JOINT REPLACEMENT Right    RT KNEE/ REVISION / LEFT KNEE   . KNEE ARTHROSCOPY Right    numerous for infection  . LEFT OOPHORECTOMY    . POLYPECTOMY  03/24/2019   Procedure: POLYPECTOMY;  Surgeon: Clarene Essex, MD;  Location: [facility];  Service: Endoscopy;;  . REPLACEMENT TOTAL KNEE BILATERAL    . TONSILLECTOMY    . TOTAL HIP ARTHROPLASTY Left 09/05/2015   Procedure: LEFT TOTAL HIP ARTHROPLASTY ANTERIOR APPROACH;  Surgeon: [name], MD;  Location: [facility];  Service: Orthopedics;  Laterality: Left;  . TUBAL LIGATION      SOCIAL HISTORY: Social History   Tobacco Use  . Smoking status: Former Smoker    Years: 4.00    Types: Cigarettes    Quit date: 1981    Years since quitting: 39.9  . Smokeless tobacco: Never Used  . Tobacco comment: 1 pkg a week  Substance Use Topics  . Alcohol use: Yes    Alcohol/week: 2.0 standard drinks    Types: 1 Glasses of wine, 1 Standard drinks or equivalent per week  . Drug use: No    FAMILY HISTORY: Family History  Problem Relation Age of Onset  . Diabetes Mother   . Heart disease Mother   . Hyperlipidemia Mother   . Hypertension Mother   . Stroke Mother   . Anxiety disorder Mother   . Obesity Mother   . Hyperlipidemia Father   . Heart disease Father   . Allergic rhinitis Father   . Hyperlipidemia Sister   . Asthma Maternal Grandfather   . Breast cancer  Neg Hx     ROS: Review of Systems  Constitutional: Positive for weight loss.    Cardiovascular: Negative for palpitations.  Gastrointestinal: Negative for nausea and vomiting.  Musculoskeletal:       Negative muscle weakness  Endo/Heme/Allergies:       Negative hot/cold intolerance    PHYSICAL EXAM: Blood pressure (!) 143/92, pulse (!) 56, temperature 97.6 F (36.4 C), temperature source Oral, height 5\' 4"  (1.626 m), weight 209 lb (94.8 kg), SpO2 97 %. Body mass index is 35.87 kg/m. Physical Exam Vitals signs reviewed.  Constitutional:      Appearance: Normal appearance. She is obese.  Cardiovascular:     Rate and Rhythm: Normal rate.     Pulses: Normal pulses.  Pulmonary:     Effort: Pulmonary effort is normal.     Breath sounds: Normal breath sounds.  Musculoskeletal: Normal range of motion.  Skin:    General: Skin is warm and dry.  Neurological:     Mental Status: She is alert and oriented to person, place, and time.  Psychiatric:        Mood and Affect: Mood normal.        Behavior: Behavior normal.     RECENT LABS AND TESTS: BMET    Component Value Date/Time   NA 142 04/13/2019 1159   K 4.0 04/13/2019 1159   CL 102 04/13/2019 1159   CO2 22 04/13/2019 1159   GLUCOSE 99 04/13/2019 1159   GLUCOSE 130 (H) 09/07/2015 0358   BUN 14 04/13/2019 1159   CREATININE 0.62 04/13/2019 1159   CALCIUM 9.4 04/13/2019 1159   GFRNONAA 88 04/13/2019 1159   GFRAA 101 04/13/2019 1159   Lab Results  Component Value Date   HGBA1C 5.6 04/27/2019   Lab Results  Component Value Date   INSULIN 13.3 04/27/2019   CBC    Component Value Date/Time   WBC 7.8 04/13/2019 1159   WBC 13.7 (H) 09/07/2015 0358   RBC 4.86 04/13/2019 1159   RBC 3.84 (L) 09/07/2015 0358   HGB 14.1 04/13/2019 1159   HCT 43.9 04/13/2019 1159   PLT 376 04/13/2019 1159   MCV 90 04/13/2019 1159   MCH 29.0 04/13/2019 1159   MCH 29.4 09/07/2015 0358   MCHC 32.1 04/13/2019 1159   MCHC 34.0 09/07/2015 0358   RDW 13.0 04/13/2019 1159   LYMPHSABS 2.3 04/13/2019 1159   MONOABS 0.6  02/05/2008 2058   EOSABS 0.4 04/13/2019 1159   BASOSABS 0.1 04/13/2019 1159   Iron/TIBC/Ferritin/ %Sat No results found for: IRON, TIBC, FERRITIN, IRONPCTSAT Lipid Panel  No results found for: CHOL, TRIG, HDL, CHOLHDL, VLDL, LDLCALC, LDLDIRECT Hepatic Function Panel     Component Value Date/Time   PROT 7.2 04/13/2019 1159   ALBUMIN 4.3 04/13/2019 1159   AST 36 04/13/2019 1159   ALT 13 04/13/2019 1159   ALKPHOS 95 04/13/2019 1159   BILITOT 0.2 04/13/2019 1159      Component Value Date/Time   TSH 1.920 04/13/2019 1159   TSH 2.924 02/05/2008 2058      OBESITY BEHAVIORAL INTERVENTION VISIT  Today's visit was # 2   Starting weight: 213 lbs Starting date: 04/13/2019 Today's weight : 209 lbs  Today's date: 04/27/2019 Total lbs lost to date: 4 At least 15 minutes were spent on discussing the following behavioral intervention visit.   ASK: We discussed the diagnosis of obesity with [name] today and Daisy agreed to give [place] permission to discuss obesity behavioral modification therapy  today.  ASSESS: Willye has the diagnosis of obesity and her BMI today is 35.86 Mikaya is in the action stage of change   ADVISE: Kaitlan was educated on the multiple health risks of obesity as well as the benefit of weight loss to improve her health. She was advised of the need for long term treatment and the importance of lifestyle modifications to improve her current health and to decrease her risk of future health problems.  AGREE: Multiple dietary modification options and treatment options were discussed and  Aarini agreed to follow the recommendations documented in the above note.  ARRANGE: Perris was educated on the importance of frequent visits to treat obesity as outlined per CMS and USPSTF guidelines and agreed to schedule her next follow up appointment today.  Wilhemena Durie, am acting as transcriptionist for [name], DO  I have reviewed the above  documentation for accuracy and completeness, and I agree with the above. [name], DO

## 2019-04-30 ENCOUNTER — Other Ambulatory Visit: Payer: Self-pay | Admitting: *Deleted

## 2019-05-05 DIAGNOSIS — R278 Other lack of coordination: Secondary | ICD-10-CM | POA: Diagnosis not present

## 2019-05-05 DIAGNOSIS — R2689 Other abnormalities of gait and mobility: Secondary | ICD-10-CM | POA: Diagnosis not present

## 2019-05-05 DIAGNOSIS — M6281 Muscle weakness (generalized): Secondary | ICD-10-CM | POA: Diagnosis not present

## 2019-05-05 DIAGNOSIS — M150 Primary generalized (osteo)arthritis: Secondary | ICD-10-CM | POA: Diagnosis not present

## 2019-05-11 ENCOUNTER — Other Ambulatory Visit (INDEPENDENT_AMBULATORY_CARE_PROVIDER_SITE_OTHER): Payer: Self-pay | Admitting: Family Medicine

## 2019-05-11 ENCOUNTER — Ambulatory Visit (INDEPENDENT_AMBULATORY_CARE_PROVIDER_SITE_OTHER): Payer: Medicare Other | Admitting: Family Medicine

## 2019-05-11 ENCOUNTER — Other Ambulatory Visit: Payer: Self-pay

## 2019-05-11 VITALS — BP 128/82 | HR 58 | Temp 97.8°F | Ht 64.0 in | Wt 208.0 lb

## 2019-05-11 DIAGNOSIS — R262 Difficulty in walking, not elsewhere classified: Secondary | ICD-10-CM | POA: Diagnosis not present

## 2019-05-11 DIAGNOSIS — I4891 Unspecified atrial fibrillation: Secondary | ICD-10-CM | POA: Diagnosis not present

## 2019-05-11 DIAGNOSIS — E559 Vitamin D deficiency, unspecified: Secondary | ICD-10-CM

## 2019-05-11 DIAGNOSIS — J4520 Mild intermittent asthma, uncomplicated: Secondary | ICD-10-CM | POA: Diagnosis not present

## 2019-05-11 DIAGNOSIS — F329 Major depressive disorder, single episode, unspecified: Secondary | ICD-10-CM | POA: Diagnosis not present

## 2019-05-11 DIAGNOSIS — E039 Hypothyroidism, unspecified: Secondary | ICD-10-CM

## 2019-05-11 DIAGNOSIS — E8881 Metabolic syndrome: Secondary | ICD-10-CM | POA: Diagnosis not present

## 2019-05-11 DIAGNOSIS — E7800 Pure hypercholesterolemia, unspecified: Secondary | ICD-10-CM | POA: Diagnosis not present

## 2019-05-11 DIAGNOSIS — M8580 Other specified disorders of bone density and structure, unspecified site: Secondary | ICD-10-CM | POA: Diagnosis not present

## 2019-05-11 DIAGNOSIS — R2689 Other abnormalities of gait and mobility: Secondary | ICD-10-CM | POA: Diagnosis not present

## 2019-05-11 DIAGNOSIS — Z6835 Body mass index (BMI) 35.0-35.9, adult: Secondary | ICD-10-CM

## 2019-05-11 DIAGNOSIS — M6281 Muscle weakness (generalized): Secondary | ICD-10-CM | POA: Diagnosis not present

## 2019-05-11 DIAGNOSIS — I10 Essential (primary) hypertension: Secondary | ICD-10-CM | POA: Diagnosis not present

## 2019-05-11 DIAGNOSIS — R278 Other lack of coordination: Secondary | ICD-10-CM | POA: Diagnosis not present

## 2019-05-11 DIAGNOSIS — M150 Primary generalized (osteo)arthritis: Secondary | ICD-10-CM | POA: Diagnosis not present

## 2019-05-11 DIAGNOSIS — M179 Osteoarthritis of knee, unspecified: Secondary | ICD-10-CM | POA: Diagnosis not present

## 2019-05-11 MED ORDER — METFORMIN HCL 500 MG PO TABS: 500.0000 mg | ORAL_TABLET | Freq: Every day | ORAL | 0 refills | Status: DC

## 2019-05-11 MED ORDER — VITAMIN D (ERGOCALCIFEROL) 1.25 MG (50000 UNIT) PO CAPS: 50000.0000 [IU] | ORAL_CAPSULE | ORAL | 0 refills | Status: DC

## 2019-05-11 NOTE — Progress Notes (Signed)
Office: [phone]  /  Fax: [phone]   HPI:   Chief Complaint: OBESITY Linda Rice is here to discuss her progress with her obesity treatment plan. She is on the Category 2 plan and is following her eating plan approximately 98 % of the time. She states she is doing physical therapy for 60 minutes 3 times per week, and is swimming for 45 minutes 2 times per week.  Her weight is 208 lb (94.3 kg) today and has had a weight loss of 1 pound over a period of 2 weeks since her last visit. She has lost 5 lbs since starting treatment with [place].  Vitamin D Deficiency Linda Rice has a diagnosis of vitamin D deficiency. She is currently taking OTC Vit D and denies nausea, vomiting or muscle weakness.  Insulin Resistance Linda Rice has a diagnosis of insulin resistance based on her elevated fasting insulin level >5. Last insulin level was 13.3. Although Shawniece's blood glucose readings are still under good control, insulin resistance puts her at greater risk of metabolic syndrome and diabetes. She is not taking metformin currently and continues to work on diet and exercise to decrease risk of diabetes.  Acquired Hypothyroidism Linda Rice has a diagnosis of hypothyroidism. She is currently on levothyroxine. She denies hot or cold intolerance or palpitations.  Linda Rice has started physical therapy and notes it is helping balance.  ASSESSMENT AND PLAN:  Vitamin D deficiency - Plan: Vitamin D, Ergocalciferol, (DRISDOL) 1.25 MG (50000 UT) CAPS capsule  Acquired hypothyroidism  Insulin resistance - Plan: metFORMIN (GLUCOPHAGE) 500 MG tablet  Trouble walking  Class 2 severe obesity with serious comorbidity and body mass index (BMI) of 35.0 to 35.9 in adult, unspecified obesity type ([place])  PLAN:  Vitamin D Deficiency [name] was informed that low vitamin D levels contributes to fatigue and are associated with obesity, breast, and colon cancer. [name] agrees to start prescription Vit D  50,000 IU every week #4 with no refills. She will follow up for routine testing of vitamin D, at least 2-3 times per year. She was informed of the risk of over-replacement of vitamin D and agrees to not increase her dose unless she discusses this with [place] first. [name] agrees to follow up with our clinic in 2 weeks.  Insulin Resistance Linda Rice will continue to work on weight loss, exercise, and decreasing simple carbohydrates in her diet to help decrease the risk of diabetes. We dicussed metformin including benefits and risks. She was informed that eating too many simple carbohydrates or too many calories at one sitting increases the likelihood of GI side effects. Linda Rice agrees to start metformin 500 mg PO daily #30 with no refills. [name] agrees to follow up with our clinic in 2 weeks as directed to monitor her progress.  Acquired Hypothyroidism [name] was informed of the importance of good thyroid control to help with weight loss efforts. She was also informed that supertheraputic thyroid levels are dangerous and will not improve weight loss results. Linda Rice agrees to continue taking levothyroxine, and she agrees to follow up with our clinic in 2 weeks.  Linda Rice will continue with physical therapy and we will continue to monitor.  Obesity Linda Rice is currently in the action stage of change. As such, her goal is to continue with weight loss efforts She has agreed to follow the Category 2 plan Linda Rice has been instructed to work up to a goal of 150 minutes of combined cardio and strengthening exercise per week as tolerated plus physical therapy  for weight loss and overall health benefits. We discussed the following Behavioral Modification Strategies today: increasing lean protein intake, decreasing simple carbohydrates, increasing vegetables, increase H20 intake, and no skipping meals   Linda Rice has agreed to follow up with our clinic in 2 weeks. She was informed of the importance  of frequent follow up visits to maximize her success with intensive lifestyle modifications for her multiple health conditions.  ALLERGIES: Allergies  Allergen Reactions  . Eggs Or Egg-Derived Products Hives    eczema Poultry Rash and GI symptoms  . Erythromycin Other (See Comments)    REACTION: stomach and hives REACTION: stomach symptoms  and hives  . Fish Allergy Other (See Comments) and Swelling    Lip swelling and eczema With Linda Rice hives   . Gatifloxacin Hives, Nausea Only and Swelling  . Other Anaphylaxis, Itching and Swelling    Legumes- all beans and peas And tree nuts  Lip swelling and eczema Legumes- all beans and peas, peanuts Orange Vegetables   . Peanut-Containing Drug Products Anaphylaxis    And tree nuts   . Poultry Meal Itching  . Banana Itching  . Keflex [Cephalexin] Rash  . Latex Itching    MEDICATIONS: Current Outpatient Medications on File Prior to Visit  Medication Sig Dispense Refill  . albuterol (PROVENTIL HFA;VENTOLIN HFA) 108 (90 BASE) MCG/ACT inhaler Inhale 1-2 puffs into the lungs every 6 (six) hours as needed for wheezing or shortness of breath.     Linda Rice amLODipine (NORVASC) 5 MG tablet Take 5 mg by mouth daily.    Linda Rice atorvastatin (LIPITOR) 10 MG tablet Take 10 mg by mouth daily.    Linda Rice CALCIUM CITRATE PO Take 500 mg by mouth 3 (three) times daily.    . cholecalciferol (VITAMIN D3) 25 MCG (1000 UT) tablet Take 1,000 Units by mouth daily.    Linda Rice doxycycline (VIBRAMYCIN) 100 MG capsule Take 100 mg by mouth 2 (two) times daily.  4  . EPINEPHrine 0.3 mg/0.3 mL IJ SOAJ injection Inject 0.3 mLs (0.3 mg total) into the muscle as needed for anaphylaxis. 2 each 1  . fluticasone (FLONASE) 50 MCG/ACT nasal spray Place 2 sprays into both nostrils daily as needed (stuffy nose). 16 g 5  . fluticasone (FLOVENT HFA) 110 MCG/ACT inhaler If you need Ventolin more than 2 days/week start Flovent. 2 puffs every 12 hours. Stop when you are cough & wheeze free. 1 Inhaler 3  .  fluticasone furoate-vilanterol (BREO ELLIPTA) 100-25 MCG/INH AEPB Inhale 1 puff into the lungs daily as needed (asthma).     Linda Rice levocetirizine (XYZAL) 5 MG tablet Take 5 mg by mouth every evening.    Linda Rice levothyroxine (SYNTHROID) 100 MCG tablet Take 100 mcg by mouth daily before breakfast.     . mirabegron ER (MYRBETRIQ) 25 MG TB24 tablet Take 25 mg by mouth every evening.     . montelukast (SINGULAIR) 10 MG tablet 1 tablet once a day to prevent coughing or wheezing 30 tablet 5  . naproxen sodium (ALEVE) 220 MG tablet Take 220 mg by mouth 2 (two) times daily with a meal.     . nitrofurantoin (MACRODANTIN) 100 MG capsule Take 100 mg by mouth every evening.      No current facility-administered medications on file prior to visit.     PAST MEDICAL HISTORY: Past Medical History:  Diagnosis Date  . Allergy   . Alopecia   . Alopecia   . Anxiety   . Arthritis   . Aspergillosis ([place])   .  Asthma   . Atopic dermatitis   . Bladder incontinence    "I HAVE A SLIGHT PROBLEM"  . Complication of anesthesia    "I CRY WHEN I WAKE UP" 03/23/2019- "not recently"  . Depression    situational - 03/23/2019- not current  . Eczema   . Follicular cystitis   . Gallbladder problem   . History of bilateral knee replacement   . History of left hip replacement   . History of transfusion    AUTOGOLUS  . Hyperlipidemia   . Hypertension   . Hypothyroidism   . Lactose intolerance   . Multiple food allergies   . Osteoarthritis   . Osteopenia   . Pneumonia   . PVC (premature ventricular contraction)   . Shortness of breath   . Thyroid disease   . Urticaria   . Vitamin D deficiency     PAST SURGICAL HISTORY: Past Surgical History:  Procedure Laterality Date  . ADENOIDECTOMY    . APPENDECTOMY    . BREAST EXCISIONAL BIOPSY Right 1990  . BREAST SURGERY     BENIGN MASS REMOVED RT BREAST  . BROW LIFT  2020  . BROW LIFT    . CARPAL TUNNEL RELEASE    . CHOLECYSTECTOMY    . COLONOSCOPY    . COLONOSCOPY  W/ POLYPECTOMY    . COLONOSCOPY WITH PROPOFOL N/A 03/24/2019   Procedure: COLONOSCOPY WITH PROPOFOL;  Surgeon: Clarene Essex, MD;  Location: [facility];  Service: Endoscopy;  Laterality: N/A;  . EYE SURGERY  2020   LID SURGERY  . EYELID REPAIR W/ SKIN GRAFT    . JOINT REPLACEMENT Right    RT KNEE/ REVISION / LEFT KNEE   . KNEE ARTHROSCOPY Right    numerous for infection  . LEFT OOPHORECTOMY    . POLYPECTOMY  03/24/2019   Procedure: POLYPECTOMY;  Surgeon: Clarene Essex, MD;  Location: [facility];  Service: Endoscopy;;  . REPLACEMENT TOTAL KNEE BILATERAL    . TONSILLECTOMY    . TOTAL HIP ARTHROPLASTY Left 09/05/2015   Procedure: LEFT TOTAL HIP ARTHROPLASTY ANTERIOR APPROACH;  Surgeon: [name], MD;  Location: [facility];  Service: Orthopedics;  Laterality: Left;  . TUBAL LIGATION      SOCIAL HISTORY: Social History   Tobacco Use  . Smoking status: Former Smoker    Years: 4.00    Types: Cigarettes    Quit date: 1981    Years since quitting: 39.9  . Smokeless tobacco: Never Used  . Tobacco comment: 1 pkg a week  Substance Use Topics  . Alcohol use: Yes    Alcohol/week: 2.0 standard drinks    Types: 1 Glasses of wine, 1 Standard drinks or equivalent per week  . Drug use: No    FAMILY HISTORY: Family History  Problem Relation Age of Onset  . Diabetes Mother   . Heart disease Mother   . Hyperlipidemia Mother   . Hypertension Mother   . Stroke Mother   . Anxiety disorder Mother   . Obesity Mother   . Hyperlipidemia Father   . Heart disease Father   . Allergic rhinitis Father   . Hyperlipidemia Sister   . Asthma Maternal Grandfather   . Breast cancer Neg Hx     ROS: Review of Systems  Constitutional: Positive for weight loss.  Cardiovascular: Negative for palpitations.  Gastrointestinal: Negative for nausea and vomiting.  Musculoskeletal:       Negative muscle weakness  Endo/Heme/Allergies:       Negative hot/cold  intolerance    PHYSICAL EXAM: Blood pressure  128/82, pulse (!) 58, temperature 97.8 F (36.6 C), temperature source Oral, height 5\' 4"  (1.626 m), weight 208 lb (94.3 kg), SpO2 97 %. Body mass index is 35.7 kg/m. Physical Exam Vitals signs reviewed.  Constitutional:      Appearance: Normal appearance. She is obese.  Cardiovascular:     Rate and Rhythm: Normal rate.     Pulses: Normal pulses.  Pulmonary:     Effort: Pulmonary effort is normal.     Breath sounds: Normal breath sounds.  Musculoskeletal: Normal range of motion.  Skin:    General: Skin is warm and dry.  Neurological:     Mental Status: She is alert and oriented to person, place, and time.  Psychiatric:        Mood and Affect: Mood normal.        Behavior: Behavior normal.     RECENT LABS AND TESTS: BMET    Component Value Date/Time   NA 142 04/13/2019 1159   K 4.0 04/13/2019 1159   CL 102 04/13/2019 1159   CO2 22 04/13/2019 1159   GLUCOSE 99 04/13/2019 1159   GLUCOSE 130 (H) 09/07/2015 0358   BUN 14 04/13/2019 1159   CREATININE 0.62 04/13/2019 1159   CALCIUM 9.4 04/13/2019 1159   GFRNONAA 88 04/13/2019 1159   GFRAA 101 04/13/2019 1159   Lab Results  Component Value Date   HGBA1C 5.6 04/27/2019   Lab Results  Component Value Date   INSULIN 13.3 04/27/2019   CBC    Component Value Date/Time   WBC 7.8 04/13/2019 1159   WBC 13.7 (H) 09/07/2015 0358   RBC 4.86 04/13/2019 1159   RBC 3.84 (L) 09/07/2015 0358   HGB 14.1 04/13/2019 1159   HCT 43.9 04/13/2019 1159   PLT 376 04/13/2019 1159   MCV 90 04/13/2019 1159   MCH 29.0 04/13/2019 1159   MCH 29.4 09/07/2015 0358   MCHC 32.1 04/13/2019 1159   MCHC 34.0 09/07/2015 0358   RDW 13.0 04/13/2019 1159   LYMPHSABS 2.3 04/13/2019 1159   MONOABS 0.6 02/05/2008 2058   EOSABS 0.4 04/13/2019 1159   BASOSABS 0.1 04/13/2019 1159   Iron/TIBC/Ferritin/ %Sat No results found for: IRON, TIBC, FERRITIN, IRONPCTSAT Lipid Panel  No results found for: CHOL, TRIG, HDL, CHOLHDL, VLDL, LDLCALC,  LDLDIRECT Hepatic Function Panel     Component Value Date/Time   PROT 7.2 04/13/2019 1159   ALBUMIN 4.3 04/13/2019 1159   AST 36 04/13/2019 1159   ALT 13 04/13/2019 1159   ALKPHOS 95 04/13/2019 1159   BILITOT 0.2 04/13/2019 1159      Component Value Date/Time   TSH 1.920 04/13/2019 1159   TSH 2.924 02/05/2008 2058      OBESITY BEHAVIORAL INTERVENTION VISIT  Today's visit was # 3   Starting weight: 213 lbs Starting date: 04/13/2019 Today's weight : 208 lbs  Today's date: 05/11/2019 Total lbs lost to date: 5 At least 15 minutes were spent on discussing the following behavioral intervention visit.   ASK: We discussed the diagnosis of obesity with [name] today and Jyla agreed to give [place] permission to discuss obesity behavioral modification therapy today.  ASSESS: Jaree has the diagnosis of obesity and her BMI today is 35.69 Dnasia is in the action stage of change   ADVISE: Maguire was educated on the multiple health risks of obesity as well as the benefit of weight loss to improve her health. She was advised  of the need for long term treatment and the importance of lifestyle modifications to improve her current health and to decrease her risk of future health problems.  AGREE: Multiple dietary modification options and treatment options were discussed and  Afnan agreed to follow the recommendations documented in the above note.  ARRANGE: Bobbette was educated on the importance of frequent visits to treat obesity as outlined per CMS and USPSTF guidelines and agreed to schedule her next follow up appointment today.  Wilhemena Durie, am acting as transcriptionist for [name], DO  I have reviewed the above documentation for accuracy and completeness, and I agree with the above. [name], DO

## 2019-05-12 ENCOUNTER — Encounter (INDEPENDENT_AMBULATORY_CARE_PROVIDER_SITE_OTHER): Payer: Self-pay | Admitting: Family Medicine

## 2019-05-12 DIAGNOSIS — M6281 Muscle weakness (generalized): Secondary | ICD-10-CM | POA: Diagnosis not present

## 2019-05-12 DIAGNOSIS — R2689 Other abnormalities of gait and mobility: Secondary | ICD-10-CM | POA: Diagnosis not present

## 2019-05-14 DIAGNOSIS — M6281 Muscle weakness (generalized): Secondary | ICD-10-CM | POA: Diagnosis not present

## 2019-05-14 DIAGNOSIS — R2689 Other abnormalities of gait and mobility: Secondary | ICD-10-CM | POA: Diagnosis not present

## 2019-05-18 DIAGNOSIS — M6281 Muscle weakness (generalized): Secondary | ICD-10-CM | POA: Diagnosis not present

## 2019-05-18 DIAGNOSIS — R2689 Other abnormalities of gait and mobility: Secondary | ICD-10-CM | POA: Diagnosis not present

## 2019-05-19 DIAGNOSIS — R2689 Other abnormalities of gait and mobility: Secondary | ICD-10-CM | POA: Diagnosis not present

## 2019-05-19 DIAGNOSIS — M6281 Muscle weakness (generalized): Secondary | ICD-10-CM | POA: Diagnosis not present

## 2019-05-21 DIAGNOSIS — M6281 Muscle weakness (generalized): Secondary | ICD-10-CM | POA: Diagnosis not present

## 2019-05-21 DIAGNOSIS — R2689 Other abnormalities of gait and mobility: Secondary | ICD-10-CM | POA: Diagnosis not present

## 2019-05-25 ENCOUNTER — Encounter (INDEPENDENT_AMBULATORY_CARE_PROVIDER_SITE_OTHER): Payer: Self-pay | Admitting: Family Medicine

## 2019-05-25 ENCOUNTER — Ambulatory Visit (INDEPENDENT_AMBULATORY_CARE_PROVIDER_SITE_OTHER): Payer: Medicare Other | Admitting: Family Medicine

## 2019-05-25 ENCOUNTER — Encounter: Payer: Self-pay | Admitting: Pediatrics

## 2019-05-25 ENCOUNTER — Ambulatory Visit (INDEPENDENT_AMBULATORY_CARE_PROVIDER_SITE_OTHER): Payer: Medicare Other | Admitting: Pediatrics

## 2019-05-25 ENCOUNTER — Other Ambulatory Visit: Payer: Self-pay

## 2019-05-25 ENCOUNTER — Other Ambulatory Visit (INDEPENDENT_AMBULATORY_CARE_PROVIDER_SITE_OTHER): Payer: Self-pay | Admitting: Family Medicine

## 2019-05-25 VITALS — BP 124/78 | HR 88 | Temp 98.0°F | Resp 16

## 2019-05-25 VITALS — HR 66 | Temp 97.7°F | Ht 64.0 in | Wt 203.0 lb

## 2019-05-25 DIAGNOSIS — Z96653 Presence of artificial knee joint, bilateral: Secondary | ICD-10-CM | POA: Diagnosis not present

## 2019-05-25 DIAGNOSIS — R011 Cardiac murmur, unspecified: Secondary | ICD-10-CM

## 2019-05-25 DIAGNOSIS — I499 Cardiac arrhythmia, unspecified: Secondary | ICD-10-CM

## 2019-05-25 DIAGNOSIS — R2689 Other abnormalities of gait and mobility: Secondary | ICD-10-CM | POA: Diagnosis not present

## 2019-05-25 DIAGNOSIS — Z91018 Allergy to other foods: Secondary | ICD-10-CM

## 2019-05-25 DIAGNOSIS — E8881 Metabolic syndrome: Secondary | ICD-10-CM | POA: Diagnosis not present

## 2019-05-25 DIAGNOSIS — L630 Alopecia (capitis) totalis: Secondary | ICD-10-CM

## 2019-05-25 DIAGNOSIS — E669 Obesity, unspecified: Secondary | ICD-10-CM | POA: Diagnosis not present

## 2019-05-25 DIAGNOSIS — E559 Vitamin D deficiency, unspecified: Secondary | ICD-10-CM | POA: Diagnosis not present

## 2019-05-25 DIAGNOSIS — J4530 Mild persistent asthma, uncomplicated: Secondary | ICD-10-CM

## 2019-05-25 DIAGNOSIS — T7800XD Anaphylactic reaction due to unspecified food, subsequent encounter: Secondary | ICD-10-CM

## 2019-05-25 DIAGNOSIS — Z6834 Body mass index (BMI) 34.0-34.9, adult: Secondary | ICD-10-CM

## 2019-05-25 DIAGNOSIS — J301 Allergic rhinitis due to pollen: Secondary | ICD-10-CM

## 2019-05-25 DIAGNOSIS — L2089 Other atopic dermatitis: Secondary | ICD-10-CM

## 2019-05-25 DIAGNOSIS — M6281 Muscle weakness (generalized): Secondary | ICD-10-CM | POA: Diagnosis not present

## 2019-05-25 MED ORDER — VITAMIN D (ERGOCALCIFEROL) 1.25 MG (50000 UNIT) PO CAPS: 50000.0000 [IU] | ORAL_CAPSULE | ORAL | 0 refills | Status: DC

## 2019-05-25 NOTE — Progress Notes (Signed)
[place] [address] Dept: [phone]  FOLLOW UP NOTE  Patient ID: Linda Rice, female    DOB: 11/17/1940  Age: 78 y.o. MRN: [id number] Date of Office Visit: 05/25/2019  Assessment  Chief Complaint: Asthma  HPI Linda Rice presents for follow-up of asthma, allergic rhinitis, eczema and food allergies.  Her asthma is well controlled by using montelukast 10 mg once a day.  If she exercises she uses Ventolin 2 puffs 5 to 15 minutes before exercise.  She used Flovent 110 -2 puffs twice a day when she would use Ventolin  She had serum testing for food allergy and had very positive testing to eggs.  Her serum IgE to chicken and [place] was extremely low and her serum IgE to a variety of fish was negative except for a minimally low level to tuna.  She is being evaluated by a wellness  physician and at this time she  does not want to do any food challenges.  She is happy with her current diet.  She has insulin resistance and was recently started on Metformin  Her eczema is followed by Dr. Wilhemina Bonito.   Drug Allergies:  Allergies  Allergen Reactions  . Eggs Or Egg-Derived Products Hives    eczema Poultry Rash and GI symptoms  . Erythromycin Other (See Comments)    REACTION: stomach and hives REACTION: stomach symptoms  and hives  . Fish Allergy Other (See Comments) and Swelling    Lip swelling and eczema With fins hives   . Gatifloxacin Hives, Nausea Only and Swelling  . Other Anaphylaxis, Itching and Swelling    Legumes- all beans and peas And tree nuts  Lip swelling and eczema Legumes- all beans and peas, peanuts Orange Vegetables   . Peanut-Containing Drug Products Anaphylaxis    And tree nuts   . Poultry Meal Itching  . Banana Itching  . Keflex [Cephalexin] Rash  . Latex Itching    Physical Exam: BP 124/78   Pulse 88   Temp 98 F (36.7 C) (Temporal)   Resp 16   SpO2 98%    Physical Exam Vitals reviewed.  Constitutional:     Appearance: She is obese.  HENT:     Head:     Comments: Eyes normal.  Ears normal.  Nose normal.  Pharynx normal. Cardiovascular:     Comments: Rhythm was irregular.  She had a grade 2/6 systolic ejection murmur best heard in the aortic valve area Pulmonary:     Comments: Clear to percussion and auscultation Musculoskeletal:     Cervical back: Neck supple.  Lymphadenopathy:     Cervical: No cervical adenopathy.  Skin:    Comments: Clear except for mild erythema of her face  Neurological:     General: No focal deficit present.     Mental Status: She is alert and oriented to person, place, and time. Mental status is at baseline.  Psychiatric:        Mood and Affect: Mood normal.        Behavior: Behavior normal.        Thought Content: Thought content normal.        Judgment: Judgment normal.     Diagnostics: FVC 2.29 L FEV1 1.51 L.  Predicted FVC 2.79 L predicted FEV1 2.09 L-the spirometry is in the normal range  Assessment and Plan: 1. Mild persistent asthma without complication   2. Anaphylactic reaction due to food, subsequent encounter   3. Irregular  heartbeat   4. Heart murmur, systolic   5. Alopecia totalis   6. Flexural atopic dermatitis   7. Seasonal allergic rhinitis due to pollen     No orders of the defined types were placed in this encounter.   Patient Instructions  Xyzal 5 mg-take 1 tablet once a day  for runny nose or itchy eyes or itching Fluticasone 2 sprays per nostril once a day if needed for stuffy nose Opcon-A-1 drop 3 times a day if needed for itchy eyes  Montelukast 10 mg-take 1 tablet once a day to prevent coughing or wheezing. Ventolin 2 puffs every 4 hours if needed for wheezing or coughing spells.  She may use Ventolin 2 puffs 5 to 15 minutes before exercise. She does not need to use Flovent unless she has cold or respiratory infection and needs to use Ventolin more than 2 days/week.  Ventolin before exercise does not count.  In that case she  will start on Flovent 110-2 puffs every 12 hours and then stop when she is cough and wheeze free.  Continue avoiding peanuts, tree nuts, egg, chicken, [place], fish.  If she has an allergic reaction take Benadryl 50 mg every 4 hours and if she has life-threatening symptoms inject with EpiPen 0.3 mg  Continue on the treatment of her eczema prescribed by Dr. Wilhemina Bonito Continue on her other medications Call us if she is not doing well on this treatment plan   Return in about 6 months (around 11/23/2019).    Thank you for the opportunity to care for this patient.  Please do not hesitate to contact me with questions.  Penne Lash, M.D.  [facility] [address] [place], [place] [address] [phone]

## 2019-05-25 NOTE — Patient Instructions (Addendum)
Xyzal 5 mg-take 1 tablet once a day  for runny nose or itchy eyes or itching Fluticasone 2 sprays per nostril once a day if needed for stuffy nose Opcon-A-1 drop 3 times a day if needed for itchy eyes  Montelukast 10 mg-take 1 tablet once a day to prevent coughing or wheezing. Ventolin 2 puffs every 4 hours if needed for wheezing or coughing spells.  She may use Ventolin 2 puffs 5 to 15 minutes before exercise. She does not need to use Flovent unless she has cold or respiratory infection and needs to use Ventolin more than 2 days/week.  Ventolin before exercise does not count.  In that case she will start on Flovent 110-2 puffs every 12 hours and then stop when she is cough and wheeze free.  Continue avoiding peanuts, tree nuts, egg, chicken, [place], fish.  If she has an allergic reaction take Benadryl 50 mg every 4 hours and if she has life-threatening symptoms inject with EpiPen 0.3 mg  Continue on the treatment of her eczema prescribed by Dr. Wilhemina Bonito Continue on her other medications Call us if she is not doing well on this treatment plan

## 2019-05-26 DIAGNOSIS — R2689 Other abnormalities of gait and mobility: Secondary | ICD-10-CM | POA: Diagnosis not present

## 2019-05-26 DIAGNOSIS — M6281 Muscle weakness (generalized): Secondary | ICD-10-CM | POA: Diagnosis not present

## 2019-05-27 NOTE — Progress Notes (Signed)
Office: [phone]  /  Fax: [phone]   HPI:  Chief Complaint: OBESITY Linda Rice is here to discuss her progress with her obesity treatment plan. She is on the follow the Category 2 plan and states she is following her eating plan approximately 100 % of the time. She states she is exercising by doing PT and cardio for 60 minutes 3 times per week.  Linda Rice states there are positive COVID cases at [employer]. She is staying at home more due to taking Calcium Citrate but she is able to get to PT which has been helpful. Note from PT will be scanned into media. Reviewed food journal today and she is doing an excellent job of getting adequate protein. She is sometimes deficient in calories. She is walking more.   Today's visit was # 4  Starting weight: 213 lbs Starting date: 04/13/19 Today's weight : Weight: 203 lb (92.1 kg)  Today's date: 05/25/19 Total lbs lost to date:10 lbs Total lbs lost since last in-office visit: 5 lbs  Insulin Resistance Linda Rice has insulin resistance. Her insulin level on 04/27/19 was 13.3. She is tolerating Metformin. She feels that it has been helpful for weight loss.   Vitamin D Deficiency Linda Rice has a diagnosis of vit D deficiency. Her vit D level on 04/13/19 was 38.5. She was previously taking OTC vit D but is now tolerating high dose vit D.   History of Bilateral Knee Replacement  Linda Rice has a history of bilateral knee replacement. She is undergoing PT for strength and balance and is doing well.   Multiple Food Allergies Linda Rice has multiple food allergies. She is now followed by allergist.   ASSESSMENT AND PLAN:  Insulin resistance - Plan: metFORMIN (GLUCOPHAGE) 500 MG tablet  Vitamin D deficiency - Plan: Vitamin D, Ergocalciferol, (DRISDOL) 1.25 MG (50000 UT) CAPS capsule  Multiple food allergies  History of total knee replacement, bilateral  Class 1 obesity with serious comorbidity and body mass index (BMI) of 34.0 to 34.9 in adult,  unspecified obesity type  PLAN:  Insulin Resistance Linda Rice will continue to work on weight loss, exercise, and decreasing simple carbohydrates to help decrease the risk of diabetes. Metformin 500 mg daily #90 with no refills sent in today.  Linda Rice agreed to follow up with [place] as directed to closely monitor her progress. We will repeat labs in 2 months.   Vitamin D Deficiency Low vitamin D level contributes to fatigue and are associated with obesity, breast, and colon cancer. She agrees to continue to take prescription Vit D @ 50,000 IU every week #4 with no refills sent in today and she will hold daily vit D for now. She will follow up for routine testing of vitamin D, at least 2-3 times per year to avoid over-replacement.  History of Bilateral Knee Replacement  Linda Rice agrees to continue with PT. Will continue to monitor.  Multiple Food allergies  We discussed her food allergies. She is doing well and agrees to continue with whole foods. Will continue to monitor along with her new Allergist.  Obesity Linda Rice is currently in the action stage of change. As such, her goal is to continue with weight loss efforts. She has agreed to follow the Category 2 plan.  Linda Rice has been instructed to work up to a goal of 150 minutes of combined cardio and strengthening exercise per week for weight loss and overall health benefits.  We discussed the following Behavioral Modification Strategies today: work on meal planning and easy cooking plans and  planning for success.   Linda Rice has agreed to follow up with our clinic in 2 weeks. She was informed of the importance of frequent follow up visits to maximize her success with intensive lifestyle modifications for her multiple health conditions.  ALLERGIES: Allergies  Allergen Reactions  . Eggs Or Egg-Derived Products Hives    eczema Poultry Rash and GI symptoms  . Erythromycin Other (See Comments)    REACTION: stomach and hives REACTION: stomach  symptoms  and hives  . Fish Allergy Other (See Comments) and Swelling    Lip swelling and eczema With fins hives   . Gatifloxacin Hives, Nausea Only and Swelling  . Other Anaphylaxis, Itching and Swelling    Legumes- all beans and peas And tree nuts  Lip swelling and eczema Legumes- all beans and peas, peanuts Orange Vegetables   . Peanut-Containing Drug Products Anaphylaxis    And tree nuts   . Poultry Meal Itching  . Banana Itching  . Keflex [Cephalexin] Rash  . Latex Itching   MEDICATIONS: Current Outpatient Medications on File Prior to Visit  Medication Sig Dispense Refill  . albuterol (PROVENTIL HFA;VENTOLIN HFA) 108 (90 BASE) MCG/ACT inhaler Inhale 1-2 puffs into the lungs every 6 (six) hours as needed for wheezing or shortness of breath.     Marland Kitchen amLODipine (NORVASC) 5 MG tablet Take 5 mg by mouth daily.    Marland Kitchen atorvastatin (LIPITOR) 10 MG tablet Take 10 mg by mouth daily.    Marland Kitchen CALCIUM CITRATE PO Take 500 mg by mouth 3 (three) times daily.    . cholecalciferol (VITAMIN D3) 25 MCG (1000 UT) tablet Take 1,000 Units by mouth daily.    Marland Kitchen doxycycline (VIBRAMYCIN) 100 MG capsule Take 100 mg by mouth 2 (two) times daily.  4  . EPINEPHrine 0.3 mg/0.3 mL IJ SOAJ injection Inject 0.3 mLs (0.3 mg total) into the muscle as needed for anaphylaxis. 2 each 1  . fluticasone (FLONASE) 50 MCG/ACT nasal spray Place 2 sprays into both nostrils daily as needed (stuffy nose). 16 g 5  . fluticasone (FLOVENT HFA) 110 MCG/ACT inhaler If you need Ventolin more than 2 days/week start Flovent. 2 puffs every 12 hours. Stop when you are cough & wheeze free. 1 Inhaler 3  . levocetirizine (XYZAL) 5 MG tablet Take 5 mg by mouth every evening.    Marland Kitchen levothyroxine (SYNTHROID) 100 MCG tablet Take 100 mcg by mouth daily before breakfast.     . montelukast (SINGULAIR) 10 MG tablet 1 tablet once a day to prevent coughing or wheezing 30 tablet 5  . naproxen sodium (ALEVE) 220 MG tablet Take 220 mg by mouth 2 (two)  times daily with a meal.     . nitrofurantoin (MACRODANTIN) 100 MG capsule Take 100 mg by mouth every evening.      No current facility-administered medications on file prior to visit.    PAST MEDICAL HISTORY: Past Medical History:  Diagnosis Date  . Alopecia   . Anxiety   . Arthritis   . Aspergillosis ([place])   . Asthma   . Atopic dermatitis   . Bladder incontinence   . Complication of anesthesia, mood lability   . Depression, situational   . Follicular cystitis   . History of bilateral knee replacement   . History of left hip replacement   . History of transfusion   . Hyperlipidemia   . Hypertension   . Hypothyroidism   . Lactose intolerance   . Multiple food allergies   . Osteoarthritis   .  Osteopenia   . Pneumonia   . PVC (premature ventricular contraction)   . Shortness of breath   . Urticaria   . Vitamin D deficiency     PAST SURGICAL HISTORY: Past Surgical History:  Procedure Laterality Date  . ADENOIDECTOMY    . APPENDECTOMY    . BREAST EXCISIONAL BIOPSY Right 1990  . BROW LIFT  2020  . CARPAL TUNNEL RELEASE    . CHOLECYSTECTOMY    . COLONOSCOPY W/ POLYPECTOMY    . COLONOSCOPY WITH PROPOFOL N/A 03/24/2019   Procedure: COLONOSCOPY WITH PROPOFOL;  Surgeon: Clarene Essex, MD;  Location: [facility];  Service: Endoscopy;  Laterality: N/A;  . EYELID REPAIR W/ SKIN GRAFT    . LEFT OOPHORECTOMY    . POLYPECTOMY  03/24/2019   Procedure: POLYPECTOMY;  Surgeon: Clarene Essex, MD;  Location: [facility];  Service: Endoscopy;;  . REPLACEMENT TOTAL KNEE BILATERAL    . TONSILLECTOMY    . TOTAL HIP ARTHROPLASTY Left 09/05/2015   Procedure: LEFT TOTAL HIP ARTHROPLASTY ANTERIOR APPROACH;  Surgeon: [name], MD;  Location: [facility];  Service: Orthopedics;  Laterality: Left;  . TUBAL LIGATION     SOCIAL HISTORY: Social History   Tobacco Use  . Smoking status: Former Smoker    Years: 4.00    Types: Cigarettes    Quit date: 1981    Years since quitting: 39.9  .  Smokeless tobacco: Never Used  . Tobacco comment: 1 pkg a week  Substance Use Topics  . Alcohol use: Yes    Alcohol/week: 2.0 standard drinks    Types: 1 Glasses of wine, 1 Standard drinks or equivalent per week  . Drug use: No   FAMILY HISTORY: Family History  Problem Relation Age of Onset  . Diabetes Mother   . Heart disease Mother   . Hyperlipidemia Mother   . Hypertension Mother   . Stroke Mother   . Anxiety disorder Mother   . Obesity Mother   . Hyperlipidemia Father   . Heart disease Father   . Allergic rhinitis Father   . Hyperlipidemia Sister   . Asthma Maternal Grandfather   . Breast cancer Neg Hx    ROS: Review of Systems  Constitutional: Positive for weight loss.   PHYSICAL EXAM: Pulse 66, temperature 97.7 F (36.5 C), temperature source Oral, height 5\' 4"  (1.626 m), weight 203 lb (92.1 kg), SpO2 97 %. Body mass index is 34.84 kg/m.   Physical Exam Vitals reviewed.  Constitutional:      Appearance: Normal appearance.  HENT:     Head: Normocephalic.  Eyes:     Conjunctiva/sclera: Conjunctivae normal.  Cardiovascular:     Rate and Rhythm: Normal rate.  Pulmonary:     Effort: Pulmonary effort is normal.  Neurological:     General: No focal deficit present.     Mental Status: She is alert.  Psychiatric:        Mood and Affect: Mood normal.        Behavior: Behavior normal.    RECENT LABS AND TESTS: BMET    Component Value Date/Time   NA 142 04/13/2019 1159   K 4.0 04/13/2019 1159   CL 102 04/13/2019 1159   CO2 22 04/13/2019 1159   GLUCOSE 99 04/13/2019 1159   GLUCOSE 130 (H) 09/07/2015 0358   BUN 14 04/13/2019 1159   CREATININE 0.62 04/13/2019 1159   CALCIUM 9.4 04/13/2019 1159   GFRNONAA 88 04/13/2019 1159   GFRAA 101 04/13/2019 1159   Lab  Results  Component Value Date   HGBA1C 5.6 04/27/2019   Lab Results  Component Value Date   INSULIN 13.3 04/27/2019   CBC    Component Value Date/Time   WBC 7.8 04/13/2019 1159   WBC 13.7  (H) 09/07/2015 0358   RBC 4.86 04/13/2019 1159   RBC 3.84 (L) 09/07/2015 0358   HGB 14.1 04/13/2019 1159   HCT 43.9 04/13/2019 1159   PLT 376 04/13/2019 1159   MCV 90 04/13/2019 1159   MCH 29.0 04/13/2019 1159   MCH 29.4 09/07/2015 0358   MCHC 32.1 04/13/2019 1159   MCHC 34.0 09/07/2015 0358   RDW 13.0 04/13/2019 1159   LYMPHSABS 2.3 04/13/2019 1159   MONOABS 0.6 02/05/2008 2058   EOSABS 0.4 04/13/2019 1159   BASOSABS 0.1 04/13/2019 1159   Hepatic Function Panel     Component Value Date/Time   PROT 7.2 04/13/2019 1159   ALBUMIN 4.3 04/13/2019 1159   AST 36 04/13/2019 1159   ALT 13 04/13/2019 1159   ALKPHOS 95 04/13/2019 1159   BILITOT 0.2 04/13/2019 1159      Component Value Date/Time   TSH 1.920 04/13/2019 1159   TSH 2.924 02/05/2008 2058    OBESITY BEHAVIORAL INTERVENTION VISIT DOCUMENTATION FOR INSURANCE (~15 minutes)   ASK: We discussed the diagnosis of obesity with [name] today and Shizuye agreed to give [place] permission to discuss obesity behavioral modification therapy today.  ASSESS: Tahja has the diagnosis of obesity and her BMI today is 34.84 Mekhi is in the action stage of change   ADVISE: Neala was educated on the multiple health risks of obesity as well as the benefit of weight loss to improve her health. She was advised of the need for long term treatment and the importance of lifestyle modifications to improve her current health and to decrease her risk of future health problems.  AGREE: Multiple dietary modification options and treatment options were discussed and  Elecia agreed to follow the recommendations documented in the above note.  ARRANGE: Lynise was educated on the importance of frequent visits to treat obesity as outlined per CMS and USPSTF guidelines and agreed to schedule her next follow up appointment today.  Leary Roca, am acting as transcriptionist for [name], DO   I have reviewed the above  documentation for accuracy and completeness, and I agree with the above. [name], DO

## 2019-05-29 ENCOUNTER — Encounter (INDEPENDENT_AMBULATORY_CARE_PROVIDER_SITE_OTHER): Payer: Self-pay | Admitting: Family Medicine

## 2019-05-29 DIAGNOSIS — Z6830 Body mass index (BMI) 30.0-30.9, adult: Secondary | ICD-10-CM | POA: Insufficient documentation

## 2019-05-29 DIAGNOSIS — E669 Obesity, unspecified: Secondary | ICD-10-CM | POA: Insufficient documentation

## 2019-05-29 DIAGNOSIS — E8881 Metabolic syndrome: Secondary | ICD-10-CM | POA: Insufficient documentation

## 2019-05-29 DIAGNOSIS — Z91018 Allergy to other foods: Secondary | ICD-10-CM | POA: Insufficient documentation

## 2019-05-29 DIAGNOSIS — E559 Vitamin D deficiency, unspecified: Secondary | ICD-10-CM | POA: Insufficient documentation

## 2019-05-29 DIAGNOSIS — E88819 Insulin resistance, unspecified: Secondary | ICD-10-CM | POA: Insufficient documentation

## 2019-05-29 MED ORDER — METFORMIN HCL 500 MG PO TABS: ORAL_TABLET | ORAL | 0 refills | Status: DC

## 2019-06-01 DIAGNOSIS — M6281 Muscle weakness (generalized): Secondary | ICD-10-CM | POA: Diagnosis not present

## 2019-06-01 DIAGNOSIS — R2689 Other abnormalities of gait and mobility: Secondary | ICD-10-CM | POA: Diagnosis not present

## 2019-06-02 DIAGNOSIS — R2689 Other abnormalities of gait and mobility: Secondary | ICD-10-CM | POA: Diagnosis not present

## 2019-06-02 DIAGNOSIS — M6281 Muscle weakness (generalized): Secondary | ICD-10-CM | POA: Diagnosis not present

## 2019-06-03 DIAGNOSIS — M6281 Muscle weakness (generalized): Secondary | ICD-10-CM | POA: Diagnosis not present

## 2019-06-03 DIAGNOSIS — R2689 Other abnormalities of gait and mobility: Secondary | ICD-10-CM | POA: Diagnosis not present

## 2019-06-09 DIAGNOSIS — R2689 Other abnormalities of gait and mobility: Secondary | ICD-10-CM | POA: Diagnosis not present

## 2019-06-09 DIAGNOSIS — M6281 Muscle weakness (generalized): Secondary | ICD-10-CM | POA: Diagnosis not present

## 2019-06-09 DIAGNOSIS — Z23 Encounter for immunization: Secondary | ICD-10-CM | POA: Diagnosis not present

## 2019-06-10 DIAGNOSIS — R2689 Other abnormalities of gait and mobility: Secondary | ICD-10-CM | POA: Diagnosis not present

## 2019-06-10 DIAGNOSIS — M6281 Muscle weakness (generalized): Secondary | ICD-10-CM | POA: Diagnosis not present

## 2019-06-12 DIAGNOSIS — R278 Other lack of coordination: Secondary | ICD-10-CM | POA: Diagnosis not present

## 2019-06-12 DIAGNOSIS — R2689 Other abnormalities of gait and mobility: Secondary | ICD-10-CM | POA: Diagnosis not present

## 2019-06-12 DIAGNOSIS — M6281 Muscle weakness (generalized): Secondary | ICD-10-CM | POA: Diagnosis not present

## 2019-06-12 DIAGNOSIS — M150 Primary generalized (osteo)arthritis: Secondary | ICD-10-CM | POA: Diagnosis not present

## 2019-06-15 ENCOUNTER — Other Ambulatory Visit: Payer: Self-pay

## 2019-06-15 ENCOUNTER — Ambulatory Visit (INDEPENDENT_AMBULATORY_CARE_PROVIDER_SITE_OTHER): Payer: Medicare Other | Admitting: Family Medicine

## 2019-06-15 ENCOUNTER — Encounter (INDEPENDENT_AMBULATORY_CARE_PROVIDER_SITE_OTHER): Payer: Self-pay | Admitting: Family Medicine

## 2019-06-15 VITALS — HR 65 | Temp 97.8°F | Ht 64.0 in | Wt 200.0 lb

## 2019-06-15 DIAGNOSIS — Z96653 Presence of artificial knee joint, bilateral: Secondary | ICD-10-CM

## 2019-06-15 DIAGNOSIS — E8881 Metabolic syndrome: Secondary | ICD-10-CM | POA: Diagnosis not present

## 2019-06-15 DIAGNOSIS — E669 Obesity, unspecified: Secondary | ICD-10-CM | POA: Diagnosis not present

## 2019-06-15 DIAGNOSIS — N390 Urinary tract infection, site not specified: Secondary | ICD-10-CM | POA: Diagnosis not present

## 2019-06-15 DIAGNOSIS — E559 Vitamin D deficiency, unspecified: Secondary | ICD-10-CM

## 2019-06-15 DIAGNOSIS — N3090 Cystitis, unspecified without hematuria: Secondary | ICD-10-CM

## 2019-06-15 DIAGNOSIS — Z6834 Body mass index (BMI) 34.0-34.9, adult: Secondary | ICD-10-CM | POA: Diagnosis not present

## 2019-06-15 DIAGNOSIS — Z91018 Allergy to other foods: Secondary | ICD-10-CM

## 2019-06-15 MED ORDER — VITAMIN D (ERGOCALCIFEROL) 1.25 MG (50000 UNIT) PO CAPS: 50000.0000 [IU] | ORAL_CAPSULE | ORAL | 0 refills | Status: DC

## 2019-06-15 MED ORDER — METFORMIN HCL 500 MG PO TABS: ORAL_TABLET | ORAL | 0 refills | Status: DC

## 2019-06-15 NOTE — Progress Notes (Deleted)
Chief Complaint: OBESITY Linda Rice is here to discuss her progress with her obesity treatment plan. Linda Rice is on the {MWMwtlossportion/plan2:23431} and states she is following her eating plan approximately *** % of the time. Linda Rice states she is *** *** minutes *** times per week.  Today's visit was # {Numbers; [id number]}  Starting weight: *** Starting date: *** Today's weight: *** Today's date: 06/15/2019 Total lbs lost to date: *** Total lbs lost since last in-office visit: ***  Subjective:   Interim History: ***.  {KPTWSF6:81275}  Assessment/Plan:   There are no diagnoses linked to this encounter.  Linda Rice {CHL AMB IS/IS NOT:[id number]} currently in the action stage of change. As such, her goal is to {MWM PLAN GOAL:23509}. She has agreed to {MWMwtlossportion/plan2:23431}. We discussed the following exercise goals today: {MWM EXERCISE RECS:23473}. We discussed the following behavioral modification strategies today: {MWMwtlossdietstrategies3:23432}.  Linda Rice has agreed to follow-up with our clinic in {NUMBER 1-10:22536} weeks. She was informed of the importance of frequent follow-up visits to maximize her success with intensive lifestyle modifications for her multiple health conditions.  Objective:   Pulse 65, temperature 97.8 F (36.6 C), temperature source Oral, height 5\' 4"  (1.626 m), weight 200 lb (90.7 kg), SpO2 98 %. Body mass index is 34.33 kg/m.  General: Cooperative, alert, well developed, in no acute distress. HEENT: Conjunctivae and lids unremarkable. Neck: No thyromegaly.  Cardiovascular: Regular rhythm.  Lungs: Normal work of breathing. Extremities: No edema.  Neurologic: No focal deficits.   Lab Results  Component Value Date   CREATININE 0.62 04/13/2019   BUN 14 04/13/2019   NA 142 04/13/2019   K 4.0 04/13/2019   CL 102 04/13/2019   CO2 22 04/13/2019   Lab Results  Component Value Date   ALT 13 04/13/2019   AST 36  04/13/2019   ALKPHOS 95 04/13/2019   BILITOT 0.2 04/13/2019   Lab Results  Component Value Date   HGBA1C 5.6 04/27/2019   Lab Results  Component Value Date   INSULIN 13.3 04/27/2019   Lab Results  Component Value Date   TSH 1.920 04/13/2019   No results found for: CHOL, HDL, LDLCALC, LDLDIRECT, TRIG, CHOLHDL Lab Results  Component Value Date   WBC 7.8 04/13/2019   HGB 14.1 04/13/2019   HCT 43.9 04/13/2019   MCV 90 04/13/2019   PLT 376 04/13/2019   No results found for: IRON, TIBC, FERRITIN Obesity Behavioral Intervention Visit Documentation for Insurance (15 Minutes):   ASK: We discussed the diagnosis of obesity with [name] today and [name] agreed to give [place] permission to discuss obesity behavioral modification therapy today.  ASSESS: Antonette has the diagnosis of obesity and her BMI today is ***. Ginny {ACTION; IS/IS TZG:[id number]} in the action stage of change.   ADVISE: Saralee was educated on the multiple health risks of obesity as well as the benefit of weight loss to improve her health. She was advised of the need for long term treatment and the importance of lifestyle modifications to improve her current health and to decrease her risk of future health problems.  AGREE: Multiple dietary modification options and treatment options were discussed and Mirabel agreed to follow the recommendations documented in the above note.  ARRANGE: Jennifermarie was educated on the importance of frequent visits to treat obesity as outlined per CMS and USPSTF guidelines and agreed to schedule her next follow up appointment today.  Attestation Statements:   Reviewed by clinician on day of  visit: {MWMREVIEWED:23436::"allergies","medications","problem list","medical history","surgical history","family history","social history","previous encounter notes"}.  This visit occurred during the SARS-CoV-2 public health emergency.  Safety protocols were in place, including screening  questions prior to the visit, additional usage of staff PPE, and extensive cleaning of exam room while observing appropriate contact time as indicated for disinfecting solutions. (CPT [id number])  I, ***, am acting as transcriptionist for ***.  I have reviewed the above documentation for accuracy and completeness, and I agree with the above. - ***

## 2019-06-16 DIAGNOSIS — M150 Primary generalized (osteo)arthritis: Secondary | ICD-10-CM | POA: Diagnosis not present

## 2019-06-16 DIAGNOSIS — R278 Other lack of coordination: Secondary | ICD-10-CM | POA: Diagnosis not present

## 2019-06-16 DIAGNOSIS — M6281 Muscle weakness (generalized): Secondary | ICD-10-CM | POA: Diagnosis not present

## 2019-06-16 DIAGNOSIS — R2689 Other abnormalities of gait and mobility: Secondary | ICD-10-CM | POA: Diagnosis not present

## 2019-06-16 NOTE — Progress Notes (Signed)
Chief Complaint: OBESITY Linda Rice is here to discuss her progress with her obesity treatment plan and obesity-related conditions. Linda Rice is on the Category 2 Plan and states she is following her eating plan approximately 98% of the time. Lauralynn states she is doing PT for 60-70 minutes 3 times per week.  Today's visit was #: 5 Starting weight: 213 lbs Starting date: 04/13/2019 Today's weight: 200 lbs Today's date: 06/16/2019 Total lbs lost to date: 13 lbs Total lbs lost since last in-office visit: 3 lbs  Subjective:   Interim History: Linda Rice had a cystoscopy this morning at [facility].  She states the inflammation is worse.  Specialist suspects eosinophilic cystitis.  She has been able to adhere to the diet and cannot exercise.  She feels she has much more energy.  Assessment/Plan:   1. Insulin resistance Last random insulin was drawn on 04/27/2019 and was 13.3.  She is tolerating metformin without side effects, and I will refill this. Linda Rice will continue to work on weight loss, exercise, and decreasing simple carbohydrates to help decrease the risk of diabetes. Linda Rice agreed to follow-up with [place] as directed to closely monitor her progress.  - MetFORMIN (GLUCOPHAGE) 500 MG tablet; TAKE 1 TABLET(500 MG) BY MOUTH DAILY WITH BREAKFAST  Dispense: 90 tablet; Refill: 0.  She agrees to follow-up with our clinic in 2 weeks.  2. Vitamin D deficiency Vitamin D level was 38 on 04/13/2019.  Low Vitamin D level contributes to fatigue and are associated with obesity, breast, and colon cancer. She agrees to continue to take prescription Vitamin D @50 ,000 IU every week and will follow-up for routine testing of vitamin D, at least 2-3 times per year to avoid over-replacement.  - Vitamin D, Ergocalciferol, (DRISDOL) 1.25 MG (50000 UT) CAPS capsule; Take 1 capsule (50,000 Units total) by mouth every 7 (seven) days.  Dispense: 4 capsule; Refill: 0.  She agrees to follow-up with our  clinic in 2 weeks.  3. Cystitis, chronic Will reach out to specialist to see if symptomatic care available and to see if diet changes appropriate.  Reinforced decreasing caffeine, alcohol, and spicy foods. I will send a message to her Urologist to see if there is an appropriate medication to use for symptom relief.  4. Food allergies, multiple She is followed by an allergist.  She will see if he has anything to add regarding her cystitis.  Will continue to follow this.  5. History of total knee replacement, bilateral She continues to do well with PT. Would like her to continue this.  6. Class 1 obesity with serious comorbidity and body mass index (BMI) of 34.0 to 34.9 in adult, unspecified obesity type Linda Rice is currently in the action stage of change. As such, her goal is to continue with weight loss efforts. She has agreed to Category 2 Plan.   We discussed the following exercise goals today: Older adults should follow the adult guidelines. When older adults cannot meet the adult guidelines, they should be as physically active as their abilities and conditions will allow.  Older adults should do exercises that maintain or improve balance if they are at risk of falling.   We discussed the following behavioral modification strategies today: increasing water intake and keeping a strict food journal.  Linda Rice has agreed to follow-up with our clinic in 2 weeks. She was informed of the importance of frequent follow-up visits to maximize her success with intensive lifestyle modifications for her  multiple health conditions.  Objective:   Pulse 65, temperature 97.8 F (36.6 C), temperature source Oral, height 5\' 4"  (1.626 m), weight 200 lb (90.7 kg), SpO2 98 %. Body mass index is 34.33 kg/m.  General: Cooperative, alert, well developed, in no acute distress. HEENT: Conjunctivae and lids unremarkable. Neck: No thyromegaly.  Cardiovascular: Regular rhythm.  Lungs: Normal work of  breathing. Extremities: No edema.  Neurologic: No focal deficits.   Lab Results  Component Value Date   CREATININE 0.62 04/13/2019   BUN 14 04/13/2019   NA 142 04/13/2019   K 4.0 04/13/2019   CL 102 04/13/2019   CO2 22 04/13/2019   Lab Results  Component Value Date   ALT 13 04/13/2019   AST 36 04/13/2019   ALKPHOS 95 04/13/2019   BILITOT 0.2 04/13/2019   Lab Results  Component Value Date   HGBA1C 5.6 04/27/2019   Lab Results  Component Value Date   INSULIN 13.3 04/27/2019   Lab Results  Component Value Date   TSH 1.920 04/13/2019   No results found for: CHOL, HDL, LDLCALC, LDLDIRECT, TRIG, CHOLHDL Lab Results  Component Value Date   WBC 7.8 04/13/2019   HGB 14.1 04/13/2019   HCT 43.9 04/13/2019   MCV 90 04/13/2019   PLT 376 04/13/2019   No results found for: IRON, TIBC, FERRITIN Obesity Behavioral Intervention Visit Documentation for Insurance (15 Minutes):   ASK: We discussed the diagnosis of obesity with Linda Rice today and Linda Rice agreed to give [place] permission to discuss obesity behavioral modification therapy today.  ASSESS: Linda Rice has the diagnosis of obesity and her BMI today is 34.3. Linda Rice is in the action stage of change.   ADVISE: Riona was educated on the multiple health risks of obesity as well as the benefit of weight loss to improve her health. She was advised of the need for long term treatment and the importance of lifestyle modifications to improve her current health and to decrease her risk of future health problems.  AGREE: Multiple dietary modification options and treatment options were discussed and Linda Rice agreed to follow the recommendations documented in the above note.  ARRANGE: Linda Rice was educated on the importance of frequent visits to treat obesity as outlined per CMS and USPSTF guidelines and agreed to schedule her next follow up appointment today.  Attestation Statements:   Reviewed by clinician on day of visit:  allergies, medications, problem list, medical history, surgical history, family history, social history and previous encounter notes.  This visit occurred during the SARS-CoV-2 public health emergency. Safety protocols were in place, including screening questions prior to the visit, additional usage of staff PPE, and extensive cleaning of exam room while observing appropriate contact time as indicated for disinfecting solutions. (CPT [id number])  I, Water quality scientist, am acting as Location manager for [employer], DO.  I have reviewed the above documentation for accuracy and completeness, and I agree with the above. [name], DO

## 2019-06-17 DIAGNOSIS — R278 Other lack of coordination: Secondary | ICD-10-CM | POA: Diagnosis not present

## 2019-06-17 DIAGNOSIS — M6281 Muscle weakness (generalized): Secondary | ICD-10-CM | POA: Diagnosis not present

## 2019-06-17 DIAGNOSIS — M150 Primary generalized (osteo)arthritis: Secondary | ICD-10-CM | POA: Diagnosis not present

## 2019-06-17 DIAGNOSIS — R2689 Other abnormalities of gait and mobility: Secondary | ICD-10-CM | POA: Diagnosis not present

## 2019-06-19 ENCOUNTER — Encounter (INDEPENDENT_AMBULATORY_CARE_PROVIDER_SITE_OTHER): Payer: Self-pay | Admitting: Family Medicine

## 2019-06-19 DIAGNOSIS — R2689 Other abnormalities of gait and mobility: Secondary | ICD-10-CM | POA: Diagnosis not present

## 2019-06-19 DIAGNOSIS — M150 Primary generalized (osteo)arthritis: Secondary | ICD-10-CM | POA: Diagnosis not present

## 2019-06-19 DIAGNOSIS — M6281 Muscle weakness (generalized): Secondary | ICD-10-CM | POA: Diagnosis not present

## 2019-06-19 DIAGNOSIS — R278 Other lack of coordination: Secondary | ICD-10-CM | POA: Diagnosis not present

## 2019-06-23 DIAGNOSIS — R2689 Other abnormalities of gait and mobility: Secondary | ICD-10-CM | POA: Diagnosis not present

## 2019-06-23 DIAGNOSIS — R278 Other lack of coordination: Secondary | ICD-10-CM | POA: Diagnosis not present

## 2019-06-23 DIAGNOSIS — M150 Primary generalized (osteo)arthritis: Secondary | ICD-10-CM | POA: Diagnosis not present

## 2019-06-23 DIAGNOSIS — M6281 Muscle weakness (generalized): Secondary | ICD-10-CM | POA: Diagnosis not present

## 2019-06-24 DIAGNOSIS — R278 Other lack of coordination: Secondary | ICD-10-CM | POA: Diagnosis not present

## 2019-06-24 DIAGNOSIS — M150 Primary generalized (osteo)arthritis: Secondary | ICD-10-CM | POA: Diagnosis not present

## 2019-06-24 DIAGNOSIS — R2689 Other abnormalities of gait and mobility: Secondary | ICD-10-CM | POA: Diagnosis not present

## 2019-06-24 DIAGNOSIS — M6281 Muscle weakness (generalized): Secondary | ICD-10-CM | POA: Diagnosis not present

## 2019-06-25 ENCOUNTER — Encounter: Payer: Self-pay | Admitting: Cardiology

## 2019-06-25 ENCOUNTER — Other Ambulatory Visit: Payer: Self-pay

## 2019-06-25 ENCOUNTER — Ambulatory Visit (INDEPENDENT_AMBULATORY_CARE_PROVIDER_SITE_OTHER): Payer: Medicare Other | Admitting: Cardiology

## 2019-06-25 VITALS — BP 122/62 | HR 71 | Temp 97.6°F | Resp 16 | Ht 64.0 in | Wt 201.9 lb

## 2019-06-25 DIAGNOSIS — I498 Other specified cardiac arrhythmias: Secondary | ICD-10-CM

## 2019-06-25 NOTE — Progress Notes (Signed)
Patient referred by [name], MD for irregular heart rhythm  Subjective:   Linda Rice, female    DOB: 01/07/1941, 79 y.o.   MRN: [id number]  Chief Complaint  Patient presents with  . Sinus arrhythmia     HPI  79 y.o. Caucasian female with hypertension, hyperlipidemia, asthma, hypothyroidism, referred for evaluation of irregular heart rhythm  Careful evaluation of telemetry and EKG showed her irregular rhythm to be sinus arrhythmia and not atrial fibrillation. Patient underwent successful colonoscopy without any cardiac events.   Current Outpatient Medications on File Prior to Visit  Medication Sig Dispense Refill  . albuterol (PROVENTIL HFA;VENTOLIN HFA) 108 (90 BASE) MCG/ACT inhaler Inhale 1-2 puffs into the lungs every 6 (six) hours as needed for wheezing or shortness of breath.     Marland Kitchen amLODipine (NORVASC) 5 MG tablet Take 5 mg by mouth daily.    Marland Kitchen atorvastatin (LIPITOR) 10 MG tablet Take 10 mg by mouth daily.    Marland Kitchen CALCIUM CITRATE PO Take 500 mg by mouth 3 (three) times daily.    Marland Kitchen Conj Estrog-Medroxyprogest Ace (PREMPRO PO) Place vaginally. Prempro cream    . doxycycline (VIBRAMYCIN) 100 MG capsule Take 100 mg by mouth 2 (two) times daily.  4  . EPINEPHrine 0.3 mg/0.3 mL IJ SOAJ injection Inject 0.3 mLs (0.3 mg total) into the muscle as needed for anaphylaxis. 2 each 1  . fluticasone (FLONASE) 50 MCG/ACT nasal spray Place 2 sprays into both nostrils daily as needed (stuffy nose). 16 g 5  . fluticasone (FLOVENT HFA) 110 MCG/ACT inhaler If you need Ventolin more than 2 days/week start Flovent. 2 puffs every 12 hours. Stop when you are cough & wheeze free. 1 Inhaler 3  . levocetirizine (XYZAL) 5 MG tablet Take 5 mg by mouth every evening.    Marland Kitchen levothyroxine (SYNTHROID) 100 MCG tablet Take 100 mcg by mouth daily before breakfast.     . metFORMIN (GLUCOPHAGE) 500 MG tablet TAKE 1 TABLET(500 MG) BY MOUTH DAILY WITH BREAKFAST 90 tablet 0  . montelukast (SINGULAIR) 10 MG  tablet 1 tablet once a day to prevent coughing or wheezing 30 tablet 5  . naproxen sodium (ALEVE) 220 MG tablet Take 220 mg by mouth 2 (two) times daily with a meal.     . nitrofurantoin (MACRODANTIN) 100 MG capsule Take 100 mg by mouth every evening.     . Vitamin D, Ergocalciferol, (DRISDOL) 1.25 MG (50000 UT) CAPS capsule Take 1 capsule (50,000 Units total) by mouth every 7 (seven) days. 4 capsule 0   No current facility-administered medications on file prior to visit.    Cardiovascular studies:  EKG 06/25/2019: Sinus rhythm 59 bpm. Occasional PAC's Cannot exclude old anteroseptal infarct.  EKG 03/23/2019:  Sinus rhythm 86 bpm with sinus arrhtymia. Occasional PAC.    Poor R-wave progression. Nonspecific ST depression.  Echocardiogram 06/23/2018: - Left ventricle: The cavity size was normal. Wall thickness was   normal. Wall motion was normal; there were no regional wall   motion abnormalities. - Right atrium: The atrium was mildly dilated. - Pulmonary arteries: Systolic pressure was mildly increased. PA   peak pressure: 34 mm Hg (S).   Recent labs: 03/23/2019: Glucose 128. BUN/Cr 15/0.78. eGFR 72. Na/K 143/3.9. H/H 14/42. MCV 90. Platelets 306.  01/19/2019: Glucose 85. BUN/Cr 20/0.78. eGFR 72. Na/K 142/4.1. Rest of the CMP normal Chol 186, TG 132, HDL 59, LDL 101.  TSH 0.97 normal Occult fecal blood positive   Review of Systems  Cardiovascular: Negative for chest pain, dyspnea on exertion, leg swelling, palpitations and syncope.         Vitals:   06/25/19 1128  BP: 122/62  Pulse: 71  Resp: 16  Temp: 97.6 F (36.4 C)  SpO2: 97%     Body mass index is 34.66 kg/m. Filed Weights   06/25/19 1128  Weight: 201 lb 14.4 oz (91.6 kg)     Objective:   Physical Exam  Constitutional: She appears well-developed and well-nourished.  Neck: No JVD present.  Cardiovascular: Normal rate, regular rhythm, normal heart sounds and intact distal pulses.  No murmur  heard. Pulmonary/Chest: Effort normal and breath sounds normal. She has no wheezes. She has no rales.  Musculoskeletal:        General: No edema.  Nursing note and vitals reviewed.         Assessment & Recommendations:   79 y.o. Caucasian female with hypertension, hyperlipidemia, asthma, hypothyroidism, sinus arrhtymia.  Occasional PAC's seen on EKG. No Afib. Continue follow up with PCP. I will see her on as needed basis.   Nigel Mormon, MD [facility] Pager: [phone] Office: [phone] If no answer Cell [phone]

## 2019-06-26 DIAGNOSIS — M6281 Muscle weakness (generalized): Secondary | ICD-10-CM | POA: Diagnosis not present

## 2019-06-26 DIAGNOSIS — N323 Diverticulum of bladder: Secondary | ICD-10-CM | POA: Diagnosis not present

## 2019-06-26 DIAGNOSIS — R9341 Abnormal radiologic findings on diagnostic imaging of renal pelvis, ureter, or bladder: Secondary | ICD-10-CM | POA: Diagnosis not present

## 2019-06-26 DIAGNOSIS — R278 Other lack of coordination: Secondary | ICD-10-CM | POA: Diagnosis not present

## 2019-06-26 DIAGNOSIS — R2689 Other abnormalities of gait and mobility: Secondary | ICD-10-CM | POA: Diagnosis not present

## 2019-06-26 DIAGNOSIS — R310 Gross hematuria: Secondary | ICD-10-CM | POA: Diagnosis not present

## 2019-06-26 DIAGNOSIS — M150 Primary generalized (osteo)arthritis: Secondary | ICD-10-CM | POA: Diagnosis not present

## 2019-06-29 ENCOUNTER — Ambulatory Visit (INDEPENDENT_AMBULATORY_CARE_PROVIDER_SITE_OTHER): Payer: Medicare Other | Admitting: Family Medicine

## 2019-06-29 ENCOUNTER — Other Ambulatory Visit: Payer: Self-pay

## 2019-06-29 ENCOUNTER — Encounter (INDEPENDENT_AMBULATORY_CARE_PROVIDER_SITE_OTHER): Payer: Self-pay | Admitting: Family Medicine

## 2019-06-29 VITALS — BP 106/66 | HR 57 | Temp 97.8°F | Ht 64.0 in | Wt 199.0 lb

## 2019-06-29 DIAGNOSIS — Z91018 Allergy to other foods: Secondary | ICD-10-CM | POA: Diagnosis not present

## 2019-06-29 DIAGNOSIS — N3020 Other chronic cystitis without hematuria: Secondary | ICD-10-CM

## 2019-06-29 DIAGNOSIS — E8881 Metabolic syndrome: Secondary | ICD-10-CM

## 2019-06-29 DIAGNOSIS — E559 Vitamin D deficiency, unspecified: Secondary | ICD-10-CM | POA: Diagnosis not present

## 2019-06-29 DIAGNOSIS — Z6834 Body mass index (BMI) 34.0-34.9, adult: Secondary | ICD-10-CM

## 2019-06-29 DIAGNOSIS — Z96653 Presence of artificial knee joint, bilateral: Secondary | ICD-10-CM

## 2019-06-29 DIAGNOSIS — E669 Obesity, unspecified: Secondary | ICD-10-CM | POA: Diagnosis not present

## 2019-06-29 MED ORDER — VITAMIN D (ERGOCALCIFEROL) 1.25 MG (50000 UNIT) PO CAPS: 50000.0000 [IU] | ORAL_CAPSULE | ORAL | 0 refills | Status: DC

## 2019-06-30 ENCOUNTER — Encounter (INDEPENDENT_AMBULATORY_CARE_PROVIDER_SITE_OTHER): Payer: Self-pay | Admitting: Family Medicine

## 2019-06-30 DIAGNOSIS — M6281 Muscle weakness (generalized): Secondary | ICD-10-CM | POA: Diagnosis not present

## 2019-06-30 DIAGNOSIS — M150 Primary generalized (osteo)arthritis: Secondary | ICD-10-CM | POA: Diagnosis not present

## 2019-06-30 DIAGNOSIS — R278 Other lack of coordination: Secondary | ICD-10-CM | POA: Diagnosis not present

## 2019-06-30 DIAGNOSIS — R2689 Other abnormalities of gait and mobility: Secondary | ICD-10-CM | POA: Diagnosis not present

## 2019-06-30 NOTE — Progress Notes (Signed)
Chief Complaint:   OBESITY Linda Rice is here to discuss her progress with her obesity treatment plan along with follow-up of her obesity related diagnoses. Linda Rice is on the Category 2 Plan and states she is following her eating plan approximately 85% of the time. Linda Rice states she is doing PT or walking for 45 minutes 3 times per week.  Today's visit was #: 6 Starting weight: 213 lbs Starting date: 04/13/2019 Today's weight: 199 lbs Today's date: 06/30/2019 Total lbs lost to date: 14 lbs Total lbs lost since last in-office visit: 1 lb  Interim History: Linda Rice says she has not been hungry and has not been meeting her protein goals this week.  She has been preoccupied with her urology workup.  Subjective:   1. Vitamin D deficiency Linda Rice's Vitamin D level was 38 on 04/23/2019. She is currently taking vit D. She denies nausea, vomiting or muscle weakness.  2. History of total bilateral knee replacement Linda Rice states she is doing well with physical therapy.  3. Insulin resistance Linda Rice has a diagnosis of insulin resistance based on her elevated fasting insulin level >5. She continues to work on diet and exercise to decrease her risk of diabetes.  Taking metformin 500 mg daily.  Lab Results  Component Value Date   INSULIN 13.3 04/27/2019   Lab Results  Component Value Date   HGBA1C 5.6 04/27/2019   4. History of food allergy The patient has a history of multiple food allergies.  5. Chronic cystitis No malignancy was found on 06/24/2017 with cytology wash.  I reviewed the today's CT with the patient.  Assessment/Plan:   1. Vitamin D deficiency Low Vitamin D level contributes to fatigue and are associated with obesity, breast, and colon cancer. She agrees to continue to take prescription Vitamin D @50 ,000 IU every week and will follow-up for routine testing of Vitamin D, at least 2-3 times per year to avoid over-replacement.  - Vitamin D, Ergocalciferol, (DRISDOL) 1.25  MG (50000 UNIT) CAPS capsule; Take 1 capsule (50,000 Units total) by mouth every 7 (seven) days.  Dispense: 12 capsule; Refill: 0  2. History of total bilateral knee replacement Will follow because mobility and pain control are important for weight management.  3. Insulin resistance Linda Rice will continue to work on weight loss, exercise, and decreasing simple carbohydrates to help decrease the risk of diabetes. Linda Rice agreed to follow-up with Linda Rice as directed to closely monitor her progress.  4. History of food allergy Will monitor.  5. Chronic cystitis Will follow along.  6. Class 1 obesity with serious comorbidity and body mass index (BMI) of 34.0 to 34.9 in adult, unspecified obesity type Linda Rice is currently in the action stage of change. As such, her goal is to continue with weight loss efforts. She has agreed to the Category 2 Plan.   Exercise goals: Linda Rice should follow the adult guidelines. When Linda Rice cannot meet the adult guidelines, they should be as physically active as their abilities and conditions will allow.  Linda Rice should do exercises that maintain or improve balance if they are at risk of falling.   Behavioral modification strategies: increasing lean protein intake, decreasing simple carbohydrates and increasing vegetables.  Linda Rice has agreed to follow-up with our clinic in 2 weeks. She was informed of the importance of frequent follow-up visits to maximize her success with intensive lifestyle modifications for her multiple health conditions.   Objective:   Blood pressure 106/66, pulse (!) 57, temperature 97.8 F (36.6 C),  temperature source Oral, height 5\' 4"  (1.626 m), weight 199 lb (90.3 kg), SpO2 99 %. Body mass index is 34.16 kg/m.  General: Cooperative, alert, well developed, in no acute distress. HEENT: Conjunctivae and lids unremarkable. Cardiovascular: Regular rhythm.  Lungs: Normal work of breathing. Neurologic: No focal deficits.    Lab Results  Component Value Date   CREATININE 0.62 04/13/2019   BUN 14 04/13/2019   NA 142 04/13/2019   K 4.0 04/13/2019   CL 102 04/13/2019   CO2 22 04/13/2019   Lab Results  Component Value Date   ALT 13 04/13/2019   AST 36 04/13/2019   ALKPHOS 95 04/13/2019   BILITOT 0.2 04/13/2019   Lab Results  Component Value Date   HGBA1C 5.6 04/27/2019   Lab Results  Component Value Date   INSULIN 13.3 04/27/2019   Lab Results  Component Value Date   TSH 1.920 04/13/2019   No results found for: CHOL, HDL, LDLCALC, LDLDIRECT, TRIG, CHOLHDL Lab Results  Component Value Date   WBC 7.8 04/13/2019   HGB 14.1 04/13/2019   HCT 43.9 04/13/2019   MCV 90 04/13/2019   PLT 376 04/13/2019   Obesity Behavioral Intervention:   Approximately 15 minutes were spent on the discussion below.  Repetitive spaced learning was employed today to elicit superior memory formation and behavioral change.  ASK: We discussed the diagnosis of obesity with Linda Rice today and Linda Rice agreed to give Linda Rice permission to discuss obesity behavioral modification therapy today.  ASSESS: [name] has the diagnosis of obesity and her BMI today is 34.2. [name] is in the action stage of change.   ADVISE: Linda Rice was educated on the multiple health risks of obesity as well as the benefit of weight loss to improve her health. She was advised of the need for long term treatment and the importance of lifestyle modifications to improve her current health and to decrease her risk of future health problems.  AGREE: Multiple dietary modification options and treatment options were discussed and Linda Rice agreed to follow the recommendations documented in the above note.  ARRANGE: [name] was educated on the importance of frequent visits to treat obesity as outlined per CMS and USPSTF guidelines and agreed to schedule her next follow up appointment today.  Attestation Statements:   Reviewed by clinician on day of visit:  allergies, medications, problem list, medical history, surgical history, family history, social history, and previous encounter notes.  I, Water quality scientist, CMA, am acting as Location manager for [employer], DO.  I have reviewed the above documentation for accuracy and completeness, and I agree with the above. [name], DO

## 2019-07-01 DIAGNOSIS — R278 Other lack of coordination: Secondary | ICD-10-CM | POA: Diagnosis not present

## 2019-07-01 DIAGNOSIS — M6281 Muscle weakness (generalized): Secondary | ICD-10-CM | POA: Diagnosis not present

## 2019-07-01 DIAGNOSIS — M150 Primary generalized (osteo)arthritis: Secondary | ICD-10-CM | POA: Diagnosis not present

## 2019-07-01 DIAGNOSIS — R2689 Other abnormalities of gait and mobility: Secondary | ICD-10-CM | POA: Diagnosis not present

## 2019-07-03 DIAGNOSIS — R278 Other lack of coordination: Secondary | ICD-10-CM | POA: Diagnosis not present

## 2019-07-03 DIAGNOSIS — M150 Primary generalized (osteo)arthritis: Secondary | ICD-10-CM | POA: Diagnosis not present

## 2019-07-03 DIAGNOSIS — R2689 Other abnormalities of gait and mobility: Secondary | ICD-10-CM | POA: Diagnosis not present

## 2019-07-03 DIAGNOSIS — M6281 Muscle weakness (generalized): Secondary | ICD-10-CM | POA: Diagnosis not present

## 2019-07-05 DIAGNOSIS — M179 Osteoarthritis of knee, unspecified: Secondary | ICD-10-CM | POA: Diagnosis not present

## 2019-07-05 DIAGNOSIS — E039 Hypothyroidism, unspecified: Secondary | ICD-10-CM | POA: Diagnosis not present

## 2019-07-05 DIAGNOSIS — E7800 Pure hypercholesterolemia, unspecified: Secondary | ICD-10-CM | POA: Diagnosis not present

## 2019-07-05 DIAGNOSIS — M8580 Other specified disorders of bone density and structure, unspecified site: Secondary | ICD-10-CM | POA: Diagnosis not present

## 2019-07-05 DIAGNOSIS — F329 Major depressive disorder, single episode, unspecified: Secondary | ICD-10-CM | POA: Diagnosis not present

## 2019-07-05 DIAGNOSIS — J4520 Mild intermittent asthma, uncomplicated: Secondary | ICD-10-CM | POA: Diagnosis not present

## 2019-07-05 DIAGNOSIS — I10 Essential (primary) hypertension: Secondary | ICD-10-CM | POA: Diagnosis not present

## 2019-07-05 DIAGNOSIS — I4891 Unspecified atrial fibrillation: Secondary | ICD-10-CM | POA: Diagnosis not present

## 2019-07-07 DIAGNOSIS — Z23 Encounter for immunization: Secondary | ICD-10-CM | POA: Diagnosis not present

## 2019-07-07 DIAGNOSIS — R2689 Other abnormalities of gait and mobility: Secondary | ICD-10-CM | POA: Diagnosis not present

## 2019-07-07 DIAGNOSIS — M150 Primary generalized (osteo)arthritis: Secondary | ICD-10-CM | POA: Diagnosis not present

## 2019-07-07 DIAGNOSIS — M6281 Muscle weakness (generalized): Secondary | ICD-10-CM | POA: Diagnosis not present

## 2019-07-07 DIAGNOSIS — R278 Other lack of coordination: Secondary | ICD-10-CM | POA: Diagnosis not present

## 2019-07-08 ENCOUNTER — Telehealth: Payer: Self-pay

## 2019-07-08 DIAGNOSIS — E785 Hyperlipidemia, unspecified: Secondary | ICD-10-CM | POA: Diagnosis not present

## 2019-07-08 DIAGNOSIS — R791 Abnormal coagulation profile: Secondary | ICD-10-CM | POA: Diagnosis not present

## 2019-07-08 DIAGNOSIS — N3289 Other specified disorders of bladder: Secondary | ICD-10-CM | POA: Diagnosis not present

## 2019-07-08 DIAGNOSIS — N329 Bladder disorder, unspecified: Secondary | ICD-10-CM | POA: Diagnosis not present

## 2019-07-08 DIAGNOSIS — Z96642 Presence of left artificial hip joint: Secondary | ICD-10-CM | POA: Diagnosis not present

## 2019-07-08 DIAGNOSIS — J4520 Mild intermittent asthma, uncomplicated: Secondary | ICD-10-CM | POA: Diagnosis not present

## 2019-07-08 DIAGNOSIS — Z01812 Encounter for preprocedural laboratory examination: Secondary | ICD-10-CM | POA: Diagnosis not present

## 2019-07-08 DIAGNOSIS — Z01818 Encounter for other preprocedural examination: Secondary | ICD-10-CM | POA: Diagnosis not present

## 2019-07-08 DIAGNOSIS — I10 Essential (primary) hypertension: Secondary | ICD-10-CM | POA: Diagnosis not present

## 2019-07-08 DIAGNOSIS — J45909 Unspecified asthma, uncomplicated: Secondary | ICD-10-CM | POA: Diagnosis not present

## 2019-07-08 DIAGNOSIS — Z96653 Presence of artificial knee joint, bilateral: Secondary | ICD-10-CM | POA: Diagnosis not present

## 2019-07-08 DIAGNOSIS — Z79899 Other long term (current) drug therapy: Secondary | ICD-10-CM | POA: Diagnosis not present

## 2019-07-08 DIAGNOSIS — E039 Hypothyroidism, unspecified: Secondary | ICD-10-CM | POA: Diagnosis not present

## 2019-07-08 NOTE — Telephone Encounter (Signed)
Please pre-op letter. Low risk. Not sure what Fax number to send to. Please check with the patient.   Thanks MJP

## 2019-07-08 NOTE — Telephone Encounter (Signed)
Telephone encounter:  Reason for call: Dr. Kathrynn Ducking from [facility] called wanted cardiac clearance. Pt is having surgery on 07/21/19. She did not have a clearance form or anything to fax over but stated you can call her with any questions. [phone]   Usual provider: MP  Last office visit: 06/25/19  Next office visit: NA   Last hospitalization: 03/24/19   Current Outpatient Medications on File Prior to Visit  Medication Sig Dispense Refill  . albuterol (PROVENTIL HFA;VENTOLIN HFA) 108 (90 BASE) MCG/ACT inhaler Inhale 1-2 puffs into the lungs every 6 (six) hours as needed for wheezing or shortness of breath.     Marland Kitchen amLODipine (NORVASC) 5 MG tablet Take 5 mg by mouth daily.    Marland Kitchen atorvastatin (LIPITOR) 10 MG tablet Take 10 mg by mouth daily.    Marland Kitchen CALCIUM CITRATE PO Take 500 mg by mouth 3 (three) times daily.    Marland Kitchen Conj Estrog-Medroxyprogest Ace (PREMPRO PO) Place vaginally. Prempro cream    . doxycycline (VIBRAMYCIN) 100 MG capsule Take 100 mg by mouth 2 (two) times daily.  4  . EPINEPHrine 0.3 mg/0.3 mL IJ SOAJ injection Inject 0.3 mLs (0.3 mg total) into the muscle as needed for anaphylaxis. 2 each 1  . fluticasone (FLONASE) 50 MCG/ACT nasal spray Place 2 sprays into both nostrils daily as needed (stuffy nose). 16 g 5  . fluticasone (FLOVENT HFA) 110 MCG/ACT inhaler If you need Ventolin more than 2 days/week start Flovent. 2 puffs every 12 hours. Stop when you are cough & wheeze free. 1 Inhaler 3  . levocetirizine (XYZAL) 5 MG tablet Take 5 mg by mouth every evening.    Marland Kitchen levothyroxine (SYNTHROID) 100 MCG tablet Take 100 mcg by mouth daily before breakfast.     . metFORMIN (GLUCOPHAGE) 500 MG tablet TAKE 1 TABLET(500 MG) BY MOUTH DAILY WITH BREAKFAST 90 tablet 0  . montelukast (SINGULAIR) 10 MG tablet 1 tablet once a day to prevent coughing or wheezing 30 tablet 5  . naproxen sodium (ALEVE) 220 MG tablet Take 220 mg by mouth 2 (two) times daily with a meal.     . nitrofurantoin  (MACRODANTIN) 100 MG capsule Take 100 mg by mouth every evening.     . solifenacin (VESICARE) 10 MG tablet Take by mouth.    . Vitamin D, Ergocalciferol, (DRISDOL) 1.25 MG (50000 UNIT) CAPS capsule Take 1 capsule (50,000 Units total) by mouth every 7 (seven) days. 12 capsule 0   No current facility-administered medications on file prior to visit.

## 2019-07-09 DIAGNOSIS — M6281 Muscle weakness (generalized): Secondary | ICD-10-CM | POA: Diagnosis not present

## 2019-07-09 DIAGNOSIS — R278 Other lack of coordination: Secondary | ICD-10-CM | POA: Diagnosis not present

## 2019-07-09 DIAGNOSIS — M150 Primary generalized (osteo)arthritis: Secondary | ICD-10-CM | POA: Diagnosis not present

## 2019-07-09 DIAGNOSIS — R2689 Other abnormalities of gait and mobility: Secondary | ICD-10-CM | POA: Diagnosis not present

## 2019-07-09 NOTE — Telephone Encounter (Signed)
BG ger with me on this please.//ah

## 2019-07-10 DIAGNOSIS — R278 Other lack of coordination: Secondary | ICD-10-CM | POA: Diagnosis not present

## 2019-07-10 DIAGNOSIS — M150 Primary generalized (osteo)arthritis: Secondary | ICD-10-CM | POA: Diagnosis not present

## 2019-07-10 DIAGNOSIS — R2689 Other abnormalities of gait and mobility: Secondary | ICD-10-CM | POA: Diagnosis not present

## 2019-07-10 DIAGNOSIS — M6281 Muscle weakness (generalized): Secondary | ICD-10-CM | POA: Diagnosis not present

## 2019-07-13 ENCOUNTER — Encounter (INDEPENDENT_AMBULATORY_CARE_PROVIDER_SITE_OTHER): Payer: Self-pay | Admitting: Family Medicine

## 2019-07-13 ENCOUNTER — Other Ambulatory Visit: Payer: Self-pay

## 2019-07-13 ENCOUNTER — Ambulatory Visit (INDEPENDENT_AMBULATORY_CARE_PROVIDER_SITE_OTHER): Payer: Medicare Other | Admitting: Family Medicine

## 2019-07-13 VITALS — BP 117/73 | HR 67 | Temp 98.0°F | Ht 64.0 in | Wt 194.0 lb

## 2019-07-13 DIAGNOSIS — E669 Obesity, unspecified: Secondary | ICD-10-CM | POA: Diagnosis not present

## 2019-07-13 DIAGNOSIS — E559 Vitamin D deficiency, unspecified: Secondary | ICD-10-CM | POA: Diagnosis not present

## 2019-07-13 DIAGNOSIS — E8881 Metabolic syndrome: Secondary | ICD-10-CM

## 2019-07-13 DIAGNOSIS — E88819 Insulin resistance, unspecified: Secondary | ICD-10-CM

## 2019-07-13 DIAGNOSIS — R300 Dysuria: Secondary | ICD-10-CM

## 2019-07-13 DIAGNOSIS — Z6833 Body mass index (BMI) 33.0-33.9, adult: Secondary | ICD-10-CM

## 2019-07-13 DIAGNOSIS — E66811 Obesity, class 1: Secondary | ICD-10-CM

## 2019-07-13 NOTE — Progress Notes (Signed)
Chief Complaint:   OBESITY Linda Rice is here to discuss her progress with her obesity treatment plan along with follow-up of her obesity related diagnoses. Linda Rice is on the Category 2 Plan and states she is following her eating plan approximately 95% of the time. Linda Rice states she is PT or walking for 65 minutes 3 times per week.  Today's visit was #: 7 Starting weight: 213 lbs Starting date: 04/13/2019 Today's weight: 195 lbs Today's date: 07/13/2019 Total lbs lost to date: 18 lbs Total lbs lost since last in-office visit: 4 lbs  Interim History: Linda Rice is happy with her 4 pound weight loss.  She has a bladder biopsy scheduled next week.  She has increased her water intake.    Subjective:   1. Insulin resistance Linda Rice has a diagnosis of insulin resistance based on her elevated fasting insulin level >5. She continues to work on diet and exercise to decrease her risk of diabetes.  She is taking metformin 500 mg daily.  Lab Results  Component Value Date   INSULIN 13.3 04/27/2019   Lab Results  Component Value Date   HGBA1C 5.6 04/27/2019   2. Vitamin D deficiency Linda Rice's Vitamin D level was 38.5 on 04/13/2019. She is currently taking vit D. She denies nausea, vomiting or muscle weakness.  3. Dysuria This is chronic and she is followed by Urology.  Assessment/Plan:   1. Insulin resistance Linda Rice will continue to work on weight loss, exercise, and decreasing simple carbohydrates to help decrease the risk of diabetes. Linda Rice agreed to follow-up with Linda Rice as directed to closely monitor her progress.  2. Vitamin D deficiency Low Vitamin D level contributes to fatigue and are associated with obesity, breast, and colon cancer. She agrees to continue to take prescription Vitamin D @50 ,000 IU every week and will follow-up for routine testing of Vitamin D, at least 2-3 times per year to avoid over-replacement.  3. Dysuria Will monitor for results of biopsy next week.  4.  Class 1 obesity with serious comorbidity and body mass index (BMI) of 33.0 to 33.9 in adult, unspecified obesity type Linda Rice is currently in the action stage of change. As such, her goal is to continue with weight loss efforts. She has agreed to the Category 2 Plan.   Exercise goals: Continue PT.  Behavioral modification strategies: increasing water intake and increasing high fiber foods.  Start Metamucil 1 capful twice daily.  Linda Rice has agreed to follow-up with our clinic in 2 weeks. She was informed of the importance of frequent follow-up visits to maximize her success with intensive lifestyle modifications for her multiple health conditions.   Objective:   Blood pressure 117/73, pulse 67, temperature 98 F (36.7 C), temperature source Oral, height 5\' 4"  (1.626 m), weight 194 lb (88 kg), SpO2 98 %. Body mass index is 33.3 kg/m.  General: Cooperative, alert, well developed, in no acute distress. HEENT: Conjunctivae and lids unremarkable. Cardiovascular: Regular rhythm.  Lungs: Normal work of breathing. Neurologic: No focal deficits.   Lab Results  Component Value Date   CREATININE 0.62 04/13/2019   BUN 14 04/13/2019   NA 142 04/13/2019   K 4.0 04/13/2019   CL 102 04/13/2019   CO2 22 04/13/2019   Lab Results  Component Value Date   ALT 13 04/13/2019   AST 36 04/13/2019   ALKPHOS 95 04/13/2019   BILITOT 0.2 04/13/2019   Lab Results  Component Value Date   HGBA1C 5.6 04/27/2019   Lab Results  Component  Value Date   INSULIN 13.3 04/27/2019   Lab Results  Component Value Date   TSH 1.920 04/13/2019   No results found for: CHOL, HDL, LDLCALC, LDLDIRECT, TRIG, CHOLHDL Lab Results  Component Value Date   WBC 7.8 04/13/2019   HGB 14.1 04/13/2019   HCT 43.9 04/13/2019   MCV 90 04/13/2019   PLT 376 04/13/2019   Obesity Behavioral Intervention:   Approximately 15 minutes were spent on the discussion below.  ASK: We discussed the diagnosis of obesity with  Linda Rice today and Linda Rice agreed to give Linda Rice permission to discuss obesity behavioral modification therapy today.  ASSESS: Linda Rice has the diagnosis of obesity and her BMI today is 33.6. Linda Rice is in the action stage of change.   ADVISE: Linda Rice was educated on the multiple health risks of obesity as well as the benefit of weight loss to improve her health. She was advised of the need for long term treatment and the importance of lifestyle modifications to improve her current health and to decrease her risk of future health problems.  AGREE: Multiple dietary modification options and treatment options were discussed and Linda Rice agreed to follow the recommendations documented in the above note.  ARRANGE: Linda Rice was educated on the importance of frequent visits to treat obesity as outlined per CMS and USPSTF guidelines and agreed to schedule her next follow up appointment today.  Attestation Statements:   Reviewed by clinician on day of visit: allergies, medications, problem list, medical history, surgical history, family history, social history, and previous encounter notes. Time spent: 30 minutes, including pre and post visit time.  I, Water quality scientist, CMA, am acting as Location manager for [employer], DO.  I have reviewed the above documentation for accuracy and completeness, and I agree with the above. [name], DO

## 2019-07-14 DIAGNOSIS — R2689 Other abnormalities of gait and mobility: Secondary | ICD-10-CM | POA: Diagnosis not present

## 2019-07-14 DIAGNOSIS — M150 Primary generalized (osteo)arthritis: Secondary | ICD-10-CM | POA: Diagnosis not present

## 2019-07-14 DIAGNOSIS — M6281 Muscle weakness (generalized): Secondary | ICD-10-CM | POA: Diagnosis not present

## 2019-07-14 DIAGNOSIS — R278 Other lack of coordination: Secondary | ICD-10-CM | POA: Diagnosis not present

## 2019-07-16 DIAGNOSIS — M150 Primary generalized (osteo)arthritis: Secondary | ICD-10-CM | POA: Diagnosis not present

## 2019-07-16 DIAGNOSIS — R278 Other lack of coordination: Secondary | ICD-10-CM | POA: Diagnosis not present

## 2019-07-16 DIAGNOSIS — R2689 Other abnormalities of gait and mobility: Secondary | ICD-10-CM | POA: Diagnosis not present

## 2019-07-16 DIAGNOSIS — M6281 Muscle weakness (generalized): Secondary | ICD-10-CM | POA: Diagnosis not present

## 2019-07-17 DIAGNOSIS — R2689 Other abnormalities of gait and mobility: Secondary | ICD-10-CM | POA: Diagnosis not present

## 2019-07-17 DIAGNOSIS — M150 Primary generalized (osteo)arthritis: Secondary | ICD-10-CM | POA: Diagnosis not present

## 2019-07-17 DIAGNOSIS — R278 Other lack of coordination: Secondary | ICD-10-CM | POA: Diagnosis not present

## 2019-07-17 DIAGNOSIS — M6281 Muscle weakness (generalized): Secondary | ICD-10-CM | POA: Diagnosis not present

## 2019-07-18 DIAGNOSIS — Z20822 Contact with and (suspected) exposure to covid-19: Secondary | ICD-10-CM | POA: Diagnosis not present

## 2019-07-18 DIAGNOSIS — Z01812 Encounter for preprocedural laboratory examination: Secondary | ICD-10-CM | POA: Diagnosis not present

## 2019-07-21 DIAGNOSIS — E039 Hypothyroidism, unspecified: Secondary | ICD-10-CM | POA: Diagnosis not present

## 2019-07-21 DIAGNOSIS — Z96642 Presence of left artificial hip joint: Secondary | ICD-10-CM | POA: Diagnosis not present

## 2019-07-21 DIAGNOSIS — N3289 Other specified disorders of bladder: Secondary | ICD-10-CM | POA: Diagnosis not present

## 2019-07-21 DIAGNOSIS — M8580 Other specified disorders of bone density and structure, unspecified site: Secondary | ICD-10-CM | POA: Diagnosis not present

## 2019-07-21 DIAGNOSIS — E559 Vitamin D deficiency, unspecified: Secondary | ICD-10-CM | POA: Diagnosis not present

## 2019-07-21 DIAGNOSIS — Z87891 Personal history of nicotine dependence: Secondary | ICD-10-CM | POA: Diagnosis not present

## 2019-07-21 DIAGNOSIS — Z79899 Other long term (current) drug therapy: Secondary | ICD-10-CM | POA: Diagnosis not present

## 2019-07-21 DIAGNOSIS — R319 Hematuria, unspecified: Secondary | ICD-10-CM | POA: Diagnosis not present

## 2019-07-21 DIAGNOSIS — E669 Obesity, unspecified: Secondary | ICD-10-CM | POA: Diagnosis not present

## 2019-07-21 DIAGNOSIS — J4520 Mild intermittent asthma, uncomplicated: Secondary | ICD-10-CM | POA: Diagnosis not present

## 2019-07-21 DIAGNOSIS — N329 Bladder disorder, unspecified: Secondary | ICD-10-CM | POA: Diagnosis not present

## 2019-07-21 DIAGNOSIS — E785 Hyperlipidemia, unspecified: Secondary | ICD-10-CM | POA: Diagnosis not present

## 2019-07-21 DIAGNOSIS — R310 Gross hematuria: Secondary | ICD-10-CM | POA: Diagnosis not present

## 2019-07-21 DIAGNOSIS — I10 Essential (primary) hypertension: Secondary | ICD-10-CM | POA: Diagnosis not present

## 2019-07-23 DIAGNOSIS — I10 Essential (primary) hypertension: Secondary | ICD-10-CM | POA: Diagnosis not present

## 2019-07-23 DIAGNOSIS — N329 Bladder disorder, unspecified: Secondary | ICD-10-CM | POA: Diagnosis not present

## 2019-07-23 DIAGNOSIS — Z6834 Body mass index (BMI) 34.0-34.9, adult: Secondary | ICD-10-CM | POA: Diagnosis not present

## 2019-07-23 DIAGNOSIS — J4520 Mild intermittent asthma, uncomplicated: Secondary | ICD-10-CM | POA: Diagnosis not present

## 2019-07-23 DIAGNOSIS — E669 Obesity, unspecified: Secondary | ICD-10-CM | POA: Diagnosis not present

## 2019-07-24 DIAGNOSIS — M150 Primary generalized (osteo)arthritis: Secondary | ICD-10-CM | POA: Diagnosis not present

## 2019-07-24 DIAGNOSIS — R278 Other lack of coordination: Secondary | ICD-10-CM | POA: Diagnosis not present

## 2019-07-24 DIAGNOSIS — M6281 Muscle weakness (generalized): Secondary | ICD-10-CM | POA: Diagnosis not present

## 2019-07-24 DIAGNOSIS — R2689 Other abnormalities of gait and mobility: Secondary | ICD-10-CM | POA: Diagnosis not present

## 2019-07-27 ENCOUNTER — Encounter (INDEPENDENT_AMBULATORY_CARE_PROVIDER_SITE_OTHER): Payer: Self-pay | Admitting: Family Medicine

## 2019-07-27 ENCOUNTER — Other Ambulatory Visit: Payer: Self-pay

## 2019-07-27 ENCOUNTER — Ambulatory Visit (INDEPENDENT_AMBULATORY_CARE_PROVIDER_SITE_OTHER): Payer: Medicare Other | Admitting: Family Medicine

## 2019-07-27 VITALS — BP 102/52 | HR 59 | Temp 97.7°F | Ht 64.0 in | Wt 194.0 lb

## 2019-07-27 DIAGNOSIS — N3020 Other chronic cystitis without hematuria: Secondary | ICD-10-CM | POA: Diagnosis not present

## 2019-07-27 DIAGNOSIS — Z6833 Body mass index (BMI) 33.0-33.9, adult: Secondary | ICD-10-CM

## 2019-07-27 DIAGNOSIS — E8881 Metabolic syndrome: Secondary | ICD-10-CM

## 2019-07-27 DIAGNOSIS — E559 Vitamin D deficiency, unspecified: Secondary | ICD-10-CM | POA: Diagnosis not present

## 2019-07-27 DIAGNOSIS — E669 Obesity, unspecified: Secondary | ICD-10-CM | POA: Diagnosis not present

## 2019-07-27 DIAGNOSIS — E88819 Insulin resistance, unspecified: Secondary | ICD-10-CM

## 2019-07-28 DIAGNOSIS — M6281 Muscle weakness (generalized): Secondary | ICD-10-CM | POA: Diagnosis not present

## 2019-07-28 DIAGNOSIS — R278 Other lack of coordination: Secondary | ICD-10-CM | POA: Diagnosis not present

## 2019-07-28 DIAGNOSIS — R2689 Other abnormalities of gait and mobility: Secondary | ICD-10-CM | POA: Diagnosis not present

## 2019-07-28 DIAGNOSIS — M150 Primary generalized (osteo)arthritis: Secondary | ICD-10-CM | POA: Diagnosis not present

## 2019-07-28 MED ORDER — VITAMIN D (ERGOCALCIFEROL) 1.25 MG (50000 UNIT) PO CAPS: 50000.0000 [IU] | ORAL_CAPSULE | ORAL | 0 refills | Status: DC

## 2019-07-28 NOTE — Progress Notes (Signed)
Chief Complaint:   OBESITY Floetta is here to discuss her progress with her obesity treatment plan along with follow-up of her obesity related diagnoses. Raul is on the Category 2 Plan and states she is following her eating plan approximately 80% of the time. [name] states she is dong physical therapy for 60-70 minutes 3 times per week.  Today's visit was #: 8 Starting weight: 213 lbs Starting date: 04/13/2019 Today's weight: 194 lbs Today's date: 07/27/2019 Total lbs lost to date: 19 lbs Total lbs lost since last in-office visit: 1 lb  Interim History: Louana had a bladder biopsy last week.  She was down for several days and off plan.  She is feeling better.  She awoke hungry for the first time this morning.  Subjective:   1. Insulin resistance Yasmeen has a diagnosis of insulin resistance based on her elevated fasting insulin level >5. She continues to work on diet and exercise to decrease her risk of diabetes.  She is taking metformin 500 mg daily.  Lab Results  Component Value Date   INSULIN 13.3 04/27/2019   Lab Results  Component Value Date   HGBA1C 5.6 04/27/2019   2. Vitamin D deficiency Caryl's Vitamin D level was 38.5 on 04/13/2019. She is currently taking vit D. She denies nausea, vomiting or muscle weakness.  3. Chronic cystitis Reviewed recent procedure with patient.  Assessment/Plan:   1. Insulin resistance Kadajah will continue to work on weight loss, exercise, and decreasing simple carbohydrates to help decrease the risk of diabetes. Ryonna agreed to follow-up with [place] as directed to closely monitor her progress.  2. Vitamin D deficiency Low Vitamin D level contributes to fatigue and are associated with obesity, breast, and colon cancer. She agrees to continue to take prescription Vitamin D @50 ,000 IU every week and will follow-up for routine testing of Vitamin D, at least 2-3 times per year to avoid over-replacement.  Orders - Vitamin D,  Ergocalciferol, (DRISDOL) 1.25 MG (50000 UNIT) CAPS capsule; Take 1 capsule (50,000 Units total) by mouth every 7 (seven) days.  Dispense: 4 capsule; Refill: 0  3. Chronic cystitis Will follow along.   4. Class 1 obesity with serious comorbidity and body mass index (BMI) of 33.0 to 33.9 in adult, unspecified obesity type Ramonda is currently in the action stage of change. As such, her goal is to continue with weight loss efforts. She has agreed to the Category 2 Plan.  Okay 100 calorie snack if hungry.  Exercise goals: As is.  Behavioral modification strategies: increasing lean protein intake.  Reviewed Wahl's protocol for chronic inflammation.  Tanequa has agreed to follow-up with our clinic in 2 weeks. She was informed of the importance of frequent follow-up visits to maximize her success with intensive lifestyle modifications for her multiple health conditions.   Objective:   Blood pressure (!) 102/52, pulse (!) 59, temperature 97.7 F (36.5 C), temperature source Oral, height 5\' 4"  (1.626 m), weight 194 lb (88 kg), SpO2 98 %. Body mass index is 33.3 kg/m.  General: Cooperative, alert, well developed, in no acute distress. HEENT: Conjunctivae and lids unremarkable. Cardiovascular: Regular rhythm.  Lungs: Normal work of breathing. Neurologic: No focal deficits.   Lab Results  Component Value Date   CREATININE 0.62 04/13/2019   BUN 14 04/13/2019   NA 142 04/13/2019   K 4.0 04/13/2019   CL 102 04/13/2019   CO2 22 04/13/2019   Lab Results  Component Value Date   ALT 13 04/13/2019  AST 36 04/13/2019   ALKPHOS 95 04/13/2019   BILITOT 0.2 04/13/2019   Lab Results  Component Value Date   HGBA1C 5.6 04/27/2019   Lab Results  Component Value Date   INSULIN 13.3 04/27/2019   Lab Results  Component Value Date   TSH 1.920 04/13/2019   Lab Results  Component Value Date   WBC 7.8 04/13/2019   HGB 14.1 04/13/2019   HCT 43.9 04/13/2019   MCV 90 04/13/2019   PLT 376  04/13/2019   Obesity Behavioral Intervention:   Approximately 15 minutes were spent on the discussion below.  ASK: We discussed the diagnosis of obesity with [name] today and Catherin agreed to give [place] permission to discuss obesity behavioral modification therapy today.  ASSESS: Maree has the diagnosis of obesity and her BMI today is 33.3. Husna is in the action stage of change.   ADVISE: Danea was educated on the multiple health risks of obesity as well as the benefit of weight loss to improve her health. She was advised of the need for long term treatment and the importance of lifestyle modifications to improve her current health and to decrease her risk of future health problems.  AGREE: Multiple dietary modification options and treatment options were discussed and Seletha agreed to follow the recommendations documented in the above note.  ARRANGE: December was educated on the importance of frequent visits to treat obesity as outlined per CMS and USPSTF guidelines and agreed to schedule her next follow up appointment today.  Attestation Statements:   Reviewed by clinician on day of visit: allergies, medications, problem list, medical history, surgical history, family history, social history, and previous encounter notes.  I, Water quality scientist, CMA, am acting as Location manager for [employer], DO.  I have reviewed the above documentation for accuracy and completeness, and I agree with the above. [name], DO

## 2019-07-29 DIAGNOSIS — M150 Primary generalized (osteo)arthritis: Secondary | ICD-10-CM | POA: Diagnosis not present

## 2019-07-29 DIAGNOSIS — R2689 Other abnormalities of gait and mobility: Secondary | ICD-10-CM | POA: Diagnosis not present

## 2019-07-29 DIAGNOSIS — R278 Other lack of coordination: Secondary | ICD-10-CM | POA: Diagnosis not present

## 2019-07-29 DIAGNOSIS — M6281 Muscle weakness (generalized): Secondary | ICD-10-CM | POA: Diagnosis not present

## 2019-07-31 DIAGNOSIS — R278 Other lack of coordination: Secondary | ICD-10-CM | POA: Diagnosis not present

## 2019-07-31 DIAGNOSIS — R2689 Other abnormalities of gait and mobility: Secondary | ICD-10-CM | POA: Diagnosis not present

## 2019-07-31 DIAGNOSIS — M6281 Muscle weakness (generalized): Secondary | ICD-10-CM | POA: Diagnosis not present

## 2019-07-31 DIAGNOSIS — M150 Primary generalized (osteo)arthritis: Secondary | ICD-10-CM | POA: Diagnosis not present

## 2019-08-04 DIAGNOSIS — R2689 Other abnormalities of gait and mobility: Secondary | ICD-10-CM | POA: Diagnosis not present

## 2019-08-04 DIAGNOSIS — R278 Other lack of coordination: Secondary | ICD-10-CM | POA: Diagnosis not present

## 2019-08-04 DIAGNOSIS — M150 Primary generalized (osteo)arthritis: Secondary | ICD-10-CM | POA: Diagnosis not present

## 2019-08-04 DIAGNOSIS — M6281 Muscle weakness (generalized): Secondary | ICD-10-CM | POA: Diagnosis not present

## 2019-08-05 DIAGNOSIS — M8580 Other specified disorders of bone density and structure, unspecified site: Secondary | ICD-10-CM | POA: Diagnosis not present

## 2019-08-05 DIAGNOSIS — R2689 Other abnormalities of gait and mobility: Secondary | ICD-10-CM | POA: Diagnosis not present

## 2019-08-05 DIAGNOSIS — R278 Other lack of coordination: Secondary | ICD-10-CM | POA: Diagnosis not present

## 2019-08-05 DIAGNOSIS — M150 Primary generalized (osteo)arthritis: Secondary | ICD-10-CM | POA: Diagnosis not present

## 2019-08-05 DIAGNOSIS — J4520 Mild intermittent asthma, uncomplicated: Secondary | ICD-10-CM | POA: Diagnosis not present

## 2019-08-05 DIAGNOSIS — E039 Hypothyroidism, unspecified: Secondary | ICD-10-CM | POA: Diagnosis not present

## 2019-08-05 DIAGNOSIS — M179 Osteoarthritis of knee, unspecified: Secondary | ICD-10-CM | POA: Diagnosis not present

## 2019-08-05 DIAGNOSIS — E7800 Pure hypercholesterolemia, unspecified: Secondary | ICD-10-CM | POA: Diagnosis not present

## 2019-08-05 DIAGNOSIS — I4891 Unspecified atrial fibrillation: Secondary | ICD-10-CM | POA: Diagnosis not present

## 2019-08-05 DIAGNOSIS — F329 Major depressive disorder, single episode, unspecified: Secondary | ICD-10-CM | POA: Diagnosis not present

## 2019-08-05 DIAGNOSIS — I10 Essential (primary) hypertension: Secondary | ICD-10-CM | POA: Diagnosis not present

## 2019-08-05 DIAGNOSIS — M6281 Muscle weakness (generalized): Secondary | ICD-10-CM | POA: Diagnosis not present

## 2019-08-07 DIAGNOSIS — M150 Primary generalized (osteo)arthritis: Secondary | ICD-10-CM | POA: Diagnosis not present

## 2019-08-07 DIAGNOSIS — M6281 Muscle weakness (generalized): Secondary | ICD-10-CM | POA: Diagnosis not present

## 2019-08-07 DIAGNOSIS — R2689 Other abnormalities of gait and mobility: Secondary | ICD-10-CM | POA: Diagnosis not present

## 2019-08-07 DIAGNOSIS — R278 Other lack of coordination: Secondary | ICD-10-CM | POA: Diagnosis not present

## 2019-08-11 DIAGNOSIS — R278 Other lack of coordination: Secondary | ICD-10-CM | POA: Diagnosis not present

## 2019-08-11 DIAGNOSIS — R2689 Other abnormalities of gait and mobility: Secondary | ICD-10-CM | POA: Diagnosis not present

## 2019-08-11 DIAGNOSIS — M150 Primary generalized (osteo)arthritis: Secondary | ICD-10-CM | POA: Diagnosis not present

## 2019-08-11 DIAGNOSIS — M6281 Muscle weakness (generalized): Secondary | ICD-10-CM | POA: Diagnosis not present

## 2019-08-12 ENCOUNTER — Encounter (INDEPENDENT_AMBULATORY_CARE_PROVIDER_SITE_OTHER): Payer: Self-pay | Admitting: Family Medicine

## 2019-08-12 ENCOUNTER — Ambulatory Visit (INDEPENDENT_AMBULATORY_CARE_PROVIDER_SITE_OTHER): Payer: Medicare Other | Admitting: Family Medicine

## 2019-08-12 ENCOUNTER — Other Ambulatory Visit: Payer: Self-pay

## 2019-08-12 VITALS — BP 109/74 | HR 53 | Temp 97.7°F | Ht 64.0 in | Wt 191.0 lb

## 2019-08-12 DIAGNOSIS — R278 Other lack of coordination: Secondary | ICD-10-CM | POA: Diagnosis not present

## 2019-08-12 DIAGNOSIS — Z91018 Allergy to other foods: Secondary | ICD-10-CM | POA: Diagnosis not present

## 2019-08-12 DIAGNOSIS — E669 Obesity, unspecified: Secondary | ICD-10-CM | POA: Diagnosis not present

## 2019-08-12 DIAGNOSIS — Z6832 Body mass index (BMI) 32.0-32.9, adult: Secondary | ICD-10-CM

## 2019-08-12 DIAGNOSIS — M150 Primary generalized (osteo)arthritis: Secondary | ICD-10-CM | POA: Diagnosis not present

## 2019-08-12 DIAGNOSIS — E039 Hypothyroidism, unspecified: Secondary | ICD-10-CM | POA: Diagnosis not present

## 2019-08-12 DIAGNOSIS — E88819 Insulin resistance, unspecified: Secondary | ICD-10-CM

## 2019-08-12 DIAGNOSIS — R2689 Other abnormalities of gait and mobility: Secondary | ICD-10-CM | POA: Diagnosis not present

## 2019-08-12 DIAGNOSIS — M6281 Muscle weakness (generalized): Secondary | ICD-10-CM | POA: Diagnosis not present

## 2019-08-12 DIAGNOSIS — E559 Vitamin D deficiency, unspecified: Secondary | ICD-10-CM

## 2019-08-12 DIAGNOSIS — E8881 Metabolic syndrome: Secondary | ICD-10-CM | POA: Diagnosis not present

## 2019-08-12 NOTE — Telephone Encounter (Signed)
Please advise. Thank you

## 2019-08-13 LAB — VITAMIN D 25 HYDROXY (VIT D DEFICIENCY, FRACTURES): Vit D, 25-Hydroxy: 53.9 ng/mL (ref 30.0–100.0)

## 2019-08-13 LAB — LIPID PANEL
Chol/HDL Ratio: 2.6 ratio (ref 0.0–4.4)
Cholesterol, Total: 169 mg/dL (ref 100–199)
HDL: 65 mg/dL (ref >39)
LDL Chol Calc (NIH): 92 mg/dL (ref 0–99)
Triglycerides: 62 mg/dL (ref 0–149)
VLDL Cholesterol Cal: 12 mg/dL (ref 5–40)

## 2019-08-13 LAB — CBC WITH DIFFERENTIAL/PLATELET
Basophils Absolute: 0 10*3/uL (ref 0.0–0.2)
Basos: 1 %
EOS (ABSOLUTE): 0.3 10*3/uL (ref 0.0–0.4)
Eos: 4 %
Hematocrit: 42.5 % (ref 34.0–46.6)
Hemoglobin: 14.2 g/dL (ref 11.1–15.9)
Immature Grans (Abs): 0 10*3/uL (ref 0.0–0.1)
Immature Granulocytes: 0 %
Lymphocytes Absolute: 2.6 10*3/uL (ref 0.7–3.1)
Lymphs: 32 %
MCH: 29.9 pg (ref 26.6–33.0)
MCHC: 33.4 g/dL (ref 31.5–35.7)
MCV: 90 fL (ref 79–97)
Monocytes Absolute: 0.6 10*3/uL (ref 0.1–0.9)
Monocytes: 7 %
Neutrophils Absolute: 4.8 10*3/uL (ref 1.4–7.0)
Neutrophils: 56 %
Platelets: 342 10*3/uL (ref 150–450)
RBC: 4.75 x10E6/uL (ref 3.77–5.28)
RDW: 13.5 % (ref 11.7–15.4)
WBC: 8.3 10*3/uL (ref 3.4–10.8)

## 2019-08-13 LAB — COMPREHENSIVE METABOLIC PANEL
ALT: 14 IU/L (ref 0–32)
AST: 39 IU/L (ref 0–40)
Albumin/Globulin Ratio: 1.6 (ref 1.2–2.2)
Albumin: 4.1 g/dL (ref 3.7–4.7)
Alkaline Phosphatase: 76 IU/L (ref 39–117)
BUN/Creatinine Ratio: 31 — ABNORMAL HIGH (ref 12–28)
BUN: 24 mg/dL (ref 8–27)
Bilirubin Total: 0.3 mg/dL (ref 0.0–1.2)
CO2: 22 mmol/L (ref 20–29)
Calcium: 9.5 mg/dL (ref 8.7–10.3)
Chloride: 101 mmol/L (ref 96–106)
Creatinine, Ser: 0.78 mg/dL (ref 0.57–1.00)
GFR calc Af Amer: 85 mL/min/{1.73_m2} (ref >59)
GFR calc non Af Amer: 74 mL/min/{1.73_m2} (ref >59)
Globulin, Total: 2.6 g/dL (ref 1.5–4.5)
Glucose: 87 mg/dL (ref 65–99)
Potassium: 4.9 mmol/L (ref 3.5–5.2)
Sodium: 140 mmol/L (ref 134–144)
Total Protein: 6.7 g/dL (ref 6.0–8.5)

## 2019-08-13 LAB — T4, FREE: Free T4: 1.6 ng/dL (ref 0.82–1.77)

## 2019-08-13 LAB — INSULIN, RANDOM: INSULIN: 5.2 u[IU]/mL (ref 2.6–24.9)

## 2019-08-13 LAB — T3: T3, Total: 76 ng/dL (ref 71–180)

## 2019-08-13 LAB — TSH: TSH: 2.67 u[IU]/mL (ref 0.450–4.500)

## 2019-08-13 NOTE — Progress Notes (Signed)
Chief Complaint:   OBESITY Linda Rice is here to discuss her progress with her obesity treatment plan along with follow-up of her obesity related diagnoses. Linda Rice is on the Category 2 Plan and states she is following her eating plan approximately 90-95% of the time. Linda Rice states she is seeing the physical therapist and walking the dog for 45-60 minutes 7 times per week.  Today's visit was #: 9 Starting weight: 213 lbs Starting date: 04/13/2019 Today's weight: 191 lbs Today's date: 08/12/2019 Total lbs lost to date: 22 lbs Total lbs lost since last in-office visit: 3 lbs  Interim History: Linda Rice says she is still waiting for an appointment for bladder pain/infammation intervention.  She reports feeling stronger.  Says she is hopeful.  We reviewed her bioimpedancy results since her first visit.  She has had a 20 pound fat loss.  Subjective:   1. Vitamin D deficiency Linda Rice's Vitamin D level was 38.5 on 04/13/2019. She is currently taking vit D. She denies nausea, vomiting or muscle weakness.  2. Insulin resistance Linda Rice has a diagnosis of insulin resistance based on her elevated fasting insulin level >5. She continues to work on diet and exercise to decrease her risk of diabetes.  She is taking metformin 500 mg daily.  Lab Results  Component Value Date   INSULIN 5.2 08/12/2019   INSULIN 13.3 04/27/2019   Lab Results  Component Value Date   HGBA1C 5.6 04/27/2019   3. Food allergies, multiple Linda Rice is followed by an allergist for her food allergies.  4. Acquired hypothyroidism Linda Rice takes levothyroxine 100 mcg daily.  Lab Results  Component Value Date   TSH 2.670 08/12/2019   Assessment/Plan:   1. Vitamin D deficiency Low Vitamin D level contributes to fatigue and are associated with obesity, breast, and colon cancer. She agrees to continue to take prescription Vitamin D @50 ,000 IU every week and will follow-up for routine testing of Vitamin D, at least 2-3 times  per year to avoid over-replacement.  Orders - CBC with Differential/Platelet - Lipid panel - VITAMIN D 25 Hydroxy (Vit-D Deficiency, Fractures)  2. Insulin resistance Linda Rice will continue to work on weight loss, exercise, and decreasing simple carbohydrates to help decrease the risk of diabetes. Linda Rice agreed to follow-up with [place] as directed to closely monitor her progress.  Orders - Comprehensive metabolic panel - CBC with Differential/Platelet - Insulin, random - Lipid panel  3. Food allergies, multiple Current treatment plan is effective, no change in therapy. We will continue to monitor. Orders and follow up as documented in patient record.  4. Acquired hypothyroidism Patient with long-standing hypothyroidism, on levothyroxine therapy. She appears euthyroid. Orders and follow up as documented in patient record.  Counseling . Good thyroid control is important for overall health. Supratherapeutic thyroid levels are dangerous and will not improve weight loss results. . The correct way to take levothyroxine is fasting, with water, separated by at least 30 minutes from breakfast, and separated by more than 4 hours from calcium, iron, multivitamins, acid reflux medications (PPIs).   Orders - Lipid panel - TSH - T4, free - T3  5. Class 1 obesity with serious comorbidity and body mass index (BMI) of 32.0 to 32.9 in adult, unspecified obesity type Linda Rice is currently in the action stage of change. As such, her goal is to continue with weight loss efforts. She has agreed to the Category 2 Plan.   Exercise goals: As is.  Behavioral modification strategies: increasing lean protein intake.  Linda Rice  has agreed to follow-up with our clinic in 2 weeks. She was informed of the importance of frequent follow-up visits to maximize her success with intensive lifestyle modifications for her multiple health conditions.   Linda Rice was informed we would discuss her lab results at her next visit  unless there is a critical issue that needs to be addressed sooner. Linda Rice agreed to keep her next visit at the agreed upon time to discuss these results.  Objective:   Blood pressure 109/74, pulse (!) 53, temperature 97.7 F (36.5 C), temperature source Oral, height 5\' 4"  (1.626 m), weight 191 lb (86.6 kg), SpO2 98 %. Body mass index is 32.79 kg/m.  General: Cooperative, alert, well developed, in no acute distress. HEENT: Conjunctivae and lids unremarkable. Cardiovascular: Regular rhythm.  Lungs: Normal work of breathing. Neurologic: No focal deficits.   Lab Results  Component Value Date   CREATININE 0.78 08/12/2019   BUN 24 08/12/2019   NA 140 08/12/2019   K 4.9 08/12/2019   CL 101 08/12/2019   CO2 22 08/12/2019   Lab Results  Component Value Date   ALT 14 08/12/2019   AST 39 08/12/2019   ALKPHOS 76 08/12/2019   BILITOT 0.3 08/12/2019   Lab Results  Component Value Date   HGBA1C 5.6 04/27/2019   Lab Results  Component Value Date   INSULIN 5.2 08/12/2019   INSULIN 13.3 04/27/2019   Lab Results  Component Value Date   TSH 2.670 08/12/2019   Lab Results  Component Value Date   CHOL 169 08/12/2019   HDL 65 08/12/2019   LDLCALC 92 08/12/2019   TRIG 62 08/12/2019   CHOLHDL 2.6 08/12/2019   Lab Results  Component Value Date   WBC 8.3 08/12/2019   HGB 14.2 08/12/2019   HCT 42.5 08/12/2019   MCV 90 08/12/2019   PLT 342 08/12/2019   Obesity Behavioral Intervention:   Approximately 15 minutes were spent on the discussion below.  ASK: We discussed the diagnosis of obesity with Linda Rice today and Linda Rice agreed to give [place] permission to discuss obesity behavioral modification therapy today.  ASSESS: Linda Rice has the diagnosis of obesity and her BMI today is 32.9. Linda Rice is in the action stage of change.   ADVISE: Linda Rice was educated on the multiple health risks of obesity as well as the benefit of weight loss to improve her health. She was advised of the  need for long term treatment and the importance of lifestyle modifications to improve her current health and to decrease her risk of future health problems.  AGREE: Multiple dietary modification options and treatment options were discussed and Lincoln agreed to follow the recommendations documented in the above note.  ARRANGE: Harvest was educated on the importance of frequent visits to treat obesity as outlined per CMS and USPSTF guidelines and agreed to schedule her next follow up appointment today.  Attestation Statements:   Reviewed by clinician on day of visit: allergies, medications, problem list, medical history, surgical history, family history, social history, and previous encounter notes.  I, Water quality scientist, CMA, am acting as Location manager for [employer], DO.  I have reviewed the above documentation for accuracy and completeness, and I agree with the above. [name], DO

## 2019-08-14 DIAGNOSIS — M6281 Muscle weakness (generalized): Secondary | ICD-10-CM | POA: Diagnosis not present

## 2019-08-14 DIAGNOSIS — R278 Other lack of coordination: Secondary | ICD-10-CM | POA: Diagnosis not present

## 2019-08-14 DIAGNOSIS — R2689 Other abnormalities of gait and mobility: Secondary | ICD-10-CM | POA: Diagnosis not present

## 2019-08-14 DIAGNOSIS — M150 Primary generalized (osteo)arthritis: Secondary | ICD-10-CM | POA: Diagnosis not present

## 2019-08-18 DIAGNOSIS — R2689 Other abnormalities of gait and mobility: Secondary | ICD-10-CM | POA: Diagnosis not present

## 2019-08-18 DIAGNOSIS — R278 Other lack of coordination: Secondary | ICD-10-CM | POA: Diagnosis not present

## 2019-08-18 DIAGNOSIS — M150 Primary generalized (osteo)arthritis: Secondary | ICD-10-CM | POA: Diagnosis not present

## 2019-08-18 DIAGNOSIS — M6281 Muscle weakness (generalized): Secondary | ICD-10-CM | POA: Diagnosis not present

## 2019-08-21 DIAGNOSIS — M6281 Muscle weakness (generalized): Secondary | ICD-10-CM | POA: Diagnosis not present

## 2019-08-21 DIAGNOSIS — R278 Other lack of coordination: Secondary | ICD-10-CM | POA: Diagnosis not present

## 2019-08-21 DIAGNOSIS — M150 Primary generalized (osteo)arthritis: Secondary | ICD-10-CM | POA: Diagnosis not present

## 2019-08-21 DIAGNOSIS — R2689 Other abnormalities of gait and mobility: Secondary | ICD-10-CM | POA: Diagnosis not present

## 2019-08-24 ENCOUNTER — Telehealth: Payer: Self-pay

## 2019-08-24 NOTE — Telephone Encounter (Signed)
Let us know how you do with bladder irrigations

## 2019-08-24 NOTE — Telephone Encounter (Signed)
Called and spoke with patient and she stated that she receives her first irrigation on the 31st of this month and will follow up with how this is helping her. She stated that they will be using a combination of antibiotics, steroids, and analgesics to help have her be able to retain the irrigation mixture for up to 1 hour to give the steroids time to react. Patient stated that she will keep you informed via MyChart message.

## 2019-08-24 NOTE — Telephone Encounter (Signed)
Tried calling pt, number was not in service

## 2019-08-24 NOTE — Telephone Encounter (Signed)
Linda Rice, Billie Ruddy, Jens Som, MD 4 days ago   Dear Dr, Shaune Leeks,  Thank you for your response to my message.  May I remind you that you put me on montelukast in November and I have been taking it daily since that time.  They are no longer calling my cystitis "interstitial" because the presumption now is that my body has identified that tissue as "foreign" and is throwing my immune system into overdrive--making it an autoimmune response.  I am scheduled to begin treatment with weekly instillation of steroids mixed with antibiotic and analgesic for 8 weeks, beginning on March 31.   Charlies Silvers, MD  Linda Rice, [name] D 4 days ago   Hi Neveah If you have interstitial cystitis ,some patients have improved with Singulair.Please discuss with your urologist . Singulair is available as a generic called montelukast 10 mg once a day. Dr. Lewayne Bunting, Damita, CMA routed conversation to Charlies Silvers, MD 12 days ago  Linda Rice, Billie Ruddy, Jens Som, MD 12 days ago   I think you will remember that I see Dr. Alto Denver at [facility] for care of my ongoing bladder inflammation issues.  She thinks that the lesions in my bladder may very well be part and parcel of my continuing immune issues.  She is undertaking instillations of antibiotics, steroids and analgesic beginning at the end of March to try to shrink the inflamed tissue.  Dr. Juleen China, your [facility] colleague and my weight loss doctor, thinks that you may have some insight on dealing with the underlying immune response.  Is there anything that comes to mind?  My record at [facility] is available to you through the joint charting options between [facility] and [facility].  Should I come in to see you or is there an immunologist either at [facility] or [facility] that may have some ideas?  Dr. Juleen China wondered about ideas that might come up from a rheumatolist...  If I were still in the [place] area I might consult the allergy folks at [facility] who cared for me in the 1980s...  Please advise to pts response to your mychart message

## 2019-08-26 DIAGNOSIS — M150 Primary generalized (osteo)arthritis: Secondary | ICD-10-CM | POA: Diagnosis not present

## 2019-08-26 DIAGNOSIS — R2689 Other abnormalities of gait and mobility: Secondary | ICD-10-CM | POA: Diagnosis not present

## 2019-08-26 DIAGNOSIS — R278 Other lack of coordination: Secondary | ICD-10-CM | POA: Diagnosis not present

## 2019-08-26 DIAGNOSIS — M6281 Muscle weakness (generalized): Secondary | ICD-10-CM | POA: Diagnosis not present

## 2019-08-31 ENCOUNTER — Other Ambulatory Visit: Payer: Self-pay

## 2019-08-31 ENCOUNTER — Ambulatory Visit (INDEPENDENT_AMBULATORY_CARE_PROVIDER_SITE_OTHER): Payer: Medicare Other | Admitting: Family Medicine

## 2019-08-31 ENCOUNTER — Encounter (INDEPENDENT_AMBULATORY_CARE_PROVIDER_SITE_OTHER): Payer: Self-pay | Admitting: Family Medicine

## 2019-08-31 VITALS — BP 114/75 | HR 54 | Temp 98.0°F | Ht 64.0 in | Wt 189.0 lb

## 2019-08-31 DIAGNOSIS — E039 Hypothyroidism, unspecified: Secondary | ICD-10-CM

## 2019-08-31 DIAGNOSIS — E669 Obesity, unspecified: Secondary | ICD-10-CM

## 2019-08-31 DIAGNOSIS — R3989 Other symptoms and signs involving the genitourinary system: Secondary | ICD-10-CM | POA: Diagnosis not present

## 2019-08-31 DIAGNOSIS — E8881 Metabolic syndrome: Secondary | ICD-10-CM | POA: Diagnosis not present

## 2019-08-31 DIAGNOSIS — E559 Vitamin D deficiency, unspecified: Secondary | ICD-10-CM

## 2019-08-31 DIAGNOSIS — E88819 Insulin resistance, unspecified: Secondary | ICD-10-CM

## 2019-08-31 DIAGNOSIS — Z6832 Body mass index (BMI) 32.0-32.9, adult: Secondary | ICD-10-CM | POA: Diagnosis not present

## 2019-08-31 MED ORDER — ESTRADIOL 0.1 MG/GM VA CREA: 1.0000 | TOPICAL_CREAM | VAGINAL | 2 refills | Status: DC

## 2019-08-31 MED ORDER — URIBEL 118 MG PO CAPS: 1.0000 | ORAL_CAPSULE | Freq: Four times a day (QID) | ORAL | 1 refills | Status: DC | PRN

## 2019-08-31 MED ORDER — METFORMIN HCL 500 MG PO TABS: ORAL_TABLET | ORAL | 0 refills | Status: DC

## 2019-08-31 NOTE — Progress Notes (Signed)
Chief Complaint:   OBESITY Linda Rice is here to discuss her progress with her obesity treatment plan along with follow-up of her obesity related diagnoses. Linda Rice is on the Category 2 Plan and states she is following her eating plan approximately 90% of the time. Linda Rice states she is going to the gym for 60 minutes 3 times per week and doing PT for 60 minutes 1 time per week.  Today's visit was #: 10 Starting weight: 213 lbs Starting date: 04/13/2019 Today's weight: 189 lbs Today's date: 08/31/2019 Total lbs lost to date: 24 lbs Total lbs lost since last in-office visit: 2 lbs  Interim History: Linda Rice reports being happy with her weight loss.  Subjective:   1. Insulin resistance Linda Rice has a diagnosis of insulin resistance based on her elevated fasting insulin level >5. She continues to work on diet and exercise to decrease her risk of diabetes.  She is taking metformin 500 mg daily.  Lab Results  Component Value Date   INSULIN 5.2 08/12/2019   INSULIN 13.3 04/27/2019   Lab Results  Component Value Date   HGBA1C 5.6 04/27/2019   2. Hypothyroidism, unspecified type She is taking levothyroxine 100 mcg daily.   Lab Results  Component Value Date   TSH 2.670 08/12/2019   3. Vitamin D deficiency Linda Rice's Vitamin D level was 53.9 on 08/12/2019. She is currently taking vit D. She denies nausea, vomiting or muscle weakness.  4. Bladder pain Linda Rice is complaining of pain in her bladder. She does see Urology for this issue and will have a procedure at the end of the month. Urology said okay to Linda Rice but did not call in per patient.   Assessment/Plan:   1. Insulin resistance Linda Rice will continue to work on weight loss, exercise, and decreasing simple carbohydrates to help decrease the risk of diabetes. Linda Rice agreed to follow-up with [place] as directed to closely monitor her progress.  Orders - metFORMIN (GLUCOPHAGE) 500 MG tablet; TAKE 1 TABLET(500 MG) BY MOUTH DAILY WITH  BREAKFAST  Dispense: 90 tablet; Refill: 0  2. Hypothyroidism Patient with long-standing hypothyroidism, on levothyroxine therapy. She appears euthyroid. Orders and follow up as documented in patient record.  Counseling . Good thyroid control is important for overall health. Supratherapeutic thyroid levels are dangerous and will not improve weight loss results. . The correct way to take levothyroxine is fasting, with water, separated by at least 30 minutes from breakfast, and separated by more than 4 hours from calcium, iron, multivitamins, acid reflux medications (PPIs).   3. Vitamin D deficiency Low Vitamin D level contributes to fatigue and are associated with obesity, breast, and colon cancer. She agrees to continue to take Vitamin D @2000  IU daily and will follow-up for routine testing of Vitamin D, at least 2-3 times per year to avoid over-replacement.  4. Bladder pain Linda Rice has been sent to the pharmacy for the patient.  5. Class 1 obesity with serious comorbidity and body mass index (BMI) of 32.0 to 32.9 in adult, unspecified obesity type Linda Rice is currently in the action stage of change. As such, her goal is to continue with weight loss efforts. She has agreed to the Category 2 Plan.   Exercise goals: As is.  Behavioral modification strategies: increasing lean protein intake.  Linda Rice has agreed to follow-up with our clinic in 2 weeks. She was informed of the importance of frequent follow-up visits to maximize her success with intensive lifestyle modifications for her multiple health conditions.   Objective:  Blood pressure 114/75, pulse (!) 54, temperature 98 F (36.7 C), temperature source Oral, height 5\' 4"  (1.626 m), weight 189 lb (85.7 kg), SpO2 99 %. Body mass index is 32.44 kg/m.  General: Cooperative, alert, well developed, in no acute distress. HEENT: Conjunctivae and lids unremarkable. Cardiovascular: Regular rhythm.  Lungs: Normal work of  breathing. Neurologic: No focal deficits.   Lab Results  Component Value Date   CREATININE 0.78 08/12/2019   BUN 24 08/12/2019   NA 140 08/12/2019   K 4.9 08/12/2019   CL 101 08/12/2019   CO2 22 08/12/2019   Lab Results  Component Value Date   ALT 14 08/12/2019   AST 39 08/12/2019   ALKPHOS 76 08/12/2019   BILITOT 0.3 08/12/2019   Lab Results  Component Value Date   HGBA1C 5.6 04/27/2019   Lab Results  Component Value Date   INSULIN 5.2 08/12/2019   INSULIN 13.3 04/27/2019   Lab Results  Component Value Date   TSH 2.670 08/12/2019   Lab Results  Component Value Date   CHOL 169 08/12/2019   HDL 65 08/12/2019   LDLCALC 92 08/12/2019   TRIG 62 08/12/2019   CHOLHDL 2.6 08/12/2019   Lab Results  Component Value Date   WBC 8.3 08/12/2019   HGB 14.2 08/12/2019   HCT 42.5 08/12/2019   MCV 90 08/12/2019   PLT 342 08/12/2019   Obesity Behavioral Intervention:   Approximately 15 minutes were spent on the discussion below.  ASK: We discussed the diagnosis of obesity with [name] today and Beautiful agreed to give [place] permission to discuss obesity behavioral modification therapy today.  ASSESS: Maylie has the diagnosis of obesity and her BMI today is 32.5. Raychelle is in the action stage of change.   ADVISE: Tehila was educated on the multiple health risks of obesity as well as the benefit of weight loss to improve her health. She was advised of the need for long term treatment and the importance of lifestyle modifications to improve her current health and to decrease her risk of future health problems.  AGREE: Multiple dietary modification options and treatment options were discussed and Yasenia agreed to follow the recommendations documented in the above note.  ARRANGE: Aviona was educated on the importance of frequent visits to treat obesity as outlined per CMS and USPSTF guidelines and agreed to schedule her next follow up appointment today.  Attestation  Statements:   Reviewed by clinician on day of visit: allergies, medications, problem list, medical history, surgical history, family history, social history, and previous encounter notes.  I, Water quality scientist, CMA, am acting as Location manager for [employer], DO.  I have reviewed the above documentation for accuracy and completeness, and I agree with the above. [name], DO

## 2019-09-01 DIAGNOSIS — M6281 Muscle weakness (generalized): Secondary | ICD-10-CM | POA: Diagnosis not present

## 2019-09-01 DIAGNOSIS — M150 Primary generalized (osteo)arthritis: Secondary | ICD-10-CM | POA: Diagnosis not present

## 2019-09-01 DIAGNOSIS — R278 Other lack of coordination: Secondary | ICD-10-CM | POA: Diagnosis not present

## 2019-09-01 DIAGNOSIS — R2689 Other abnormalities of gait and mobility: Secondary | ICD-10-CM | POA: Diagnosis not present

## 2019-09-03 DIAGNOSIS — R278 Other lack of coordination: Secondary | ICD-10-CM | POA: Diagnosis not present

## 2019-09-03 DIAGNOSIS — R2689 Other abnormalities of gait and mobility: Secondary | ICD-10-CM | POA: Diagnosis not present

## 2019-09-03 DIAGNOSIS — M150 Primary generalized (osteo)arthritis: Secondary | ICD-10-CM | POA: Diagnosis not present

## 2019-09-03 DIAGNOSIS — M6281 Muscle weakness (generalized): Secondary | ICD-10-CM | POA: Diagnosis not present

## 2019-09-04 DIAGNOSIS — Z96653 Presence of artificial knee joint, bilateral: Secondary | ICD-10-CM | POA: Diagnosis not present

## 2019-09-04 DIAGNOSIS — Z471 Aftercare following joint replacement surgery: Secondary | ICD-10-CM | POA: Diagnosis not present

## 2019-09-04 DIAGNOSIS — Z96652 Presence of left artificial knee joint: Secondary | ICD-10-CM | POA: Diagnosis not present

## 2019-09-04 DIAGNOSIS — Z96642 Presence of left artificial hip joint: Secondary | ICD-10-CM | POA: Diagnosis not present

## 2019-09-04 DIAGNOSIS — Z96651 Presence of right artificial knee joint: Secondary | ICD-10-CM | POA: Diagnosis not present

## 2019-09-08 DIAGNOSIS — M150 Primary generalized (osteo)arthritis: Secondary | ICD-10-CM | POA: Diagnosis not present

## 2019-09-08 DIAGNOSIS — R278 Other lack of coordination: Secondary | ICD-10-CM | POA: Diagnosis not present

## 2019-09-08 DIAGNOSIS — M6281 Muscle weakness (generalized): Secondary | ICD-10-CM | POA: Diagnosis not present

## 2019-09-08 DIAGNOSIS — R2689 Other abnormalities of gait and mobility: Secondary | ICD-10-CM | POA: Diagnosis not present

## 2019-09-09 DIAGNOSIS — N3030 Trigonitis without hematuria: Secondary | ICD-10-CM | POA: Diagnosis not present

## 2019-09-16 DIAGNOSIS — N3030 Trigonitis without hematuria: Secondary | ICD-10-CM | POA: Diagnosis not present

## 2019-09-17 DIAGNOSIS — M6281 Muscle weakness (generalized): Secondary | ICD-10-CM | POA: Diagnosis not present

## 2019-09-17 DIAGNOSIS — R278 Other lack of coordination: Secondary | ICD-10-CM | POA: Diagnosis not present

## 2019-09-17 DIAGNOSIS — M150 Primary generalized (osteo)arthritis: Secondary | ICD-10-CM | POA: Diagnosis not present

## 2019-09-17 DIAGNOSIS — R2689 Other abnormalities of gait and mobility: Secondary | ICD-10-CM | POA: Diagnosis not present

## 2019-09-21 ENCOUNTER — Ambulatory Visit (INDEPENDENT_AMBULATORY_CARE_PROVIDER_SITE_OTHER): Payer: Medicare Other | Admitting: Family Medicine

## 2019-09-21 ENCOUNTER — Encounter (INDEPENDENT_AMBULATORY_CARE_PROVIDER_SITE_OTHER): Payer: Self-pay | Admitting: Family Medicine

## 2019-09-21 ENCOUNTER — Other Ambulatory Visit: Payer: Self-pay

## 2019-09-21 VITALS — BP 110/71 | HR 50 | Temp 97.7°F | Ht 64.0 in | Wt 187.0 lb

## 2019-09-21 DIAGNOSIS — E8881 Metabolic syndrome: Secondary | ICD-10-CM

## 2019-09-21 DIAGNOSIS — R262 Difficulty in walking, not elsewhere classified: Secondary | ICD-10-CM

## 2019-09-21 DIAGNOSIS — E669 Obesity, unspecified: Secondary | ICD-10-CM

## 2019-09-21 DIAGNOSIS — R3989 Other symptoms and signs involving the genitourinary system: Secondary | ICD-10-CM | POA: Diagnosis not present

## 2019-09-21 DIAGNOSIS — Z6832 Body mass index (BMI) 32.0-32.9, adult: Secondary | ICD-10-CM | POA: Diagnosis not present

## 2019-09-21 DIAGNOSIS — J302 Other seasonal allergic rhinitis: Secondary | ICD-10-CM

## 2019-09-21 DIAGNOSIS — E559 Vitamin D deficiency, unspecified: Secondary | ICD-10-CM

## 2019-09-21 NOTE — Progress Notes (Signed)
Chief Complaint:   OBESITY Linda Rice is here to discuss her progress with her obesity treatment plan along with follow-up of her obesity related diagnoses. Linda Rice is on the Category 2 Plan and states she is following her eating plan approximately 95% of the time. Linda Rice states she is going to PT for 60 minutes 3 times per week and going to the gym for 60 minutes 1 time per week.  Today's visit was #: 11 Starting weight: 213 lbs Starting date: 04/13/2019 Today's weight: 187 lbs Today's date: 09/21/2019 Total lbs lost to date: 26 lbs Total lbs lost since last in-office visit: 2 lbs  Interim History: Linda Rice happily reports that her bladder infusions are helping.  She has decreased pain and sense of urgency.  She continues to track food and exercise.  Her physical therapist has discussed graduation from the program.  Subjective:   1. Vitamin D deficiency Linda Rice's Vitamin D level was 53.9 on 08/12/2019.  Vitamin D level is currently at goal.  2. Bladder pain Improving.  Still also taking Uribel at bedtime.  3. Insulin resistance Linda Rice has a diagnosis of insulin resistance based on her elevated fasting insulin level >5. She continues to work on diet and exercise to decrease her risk of diabetes.  She is taking metformin 500 mg daily.  Lab Results  Component Value Date   INSULIN 5.2 08/12/2019   INSULIN 13.3 04/27/2019   Lab Results  Component Value Date   HGBA1C 5.6 04/27/2019   4. Seasonal allergies She says that her allergies have been stable this year since taking Singulair.  5. Trouble walking Linda Rice has had marked improvement in balance and strength with PT.  Assessment/Plan:   1. Vitamin D deficiency Current treatment plan is effective, no change in therapy. Orders and follow up as documented in patient record.  2. Bladder pain Will continue to monitor.  3. Insulin resistance Linda Rice will continue to work on weight loss, exercise, and decreasing simple  carbohydrates to help decrease the risk of diabetes. Linda Rice agreed to follow-up with [place] as directed to closely monitor her progress.  4. Seasonal allergies .Well controlled.  No signs of complications, medication side effects, or red flags.  Continue current regimen.    5. Trouble walking Continue exercise pre PT recommendations.  6. Class 1 obesity with serious comorbidity and body mass index (BMI) of 32.0 to 32.9 in adult, unspecified obesity type Linda Rice is currently in the action stage of change. As such, her goal is to continue with weight loss efforts. She has agreed to the Category 2 Plan.   Exercise goals: As is.  Behavioral modification strategies: increasing lean protein intake and increasing water intake.  Linda Rice has agreed to follow-up with our clinic in 2 weeks. She was informed of the importance of frequent follow-up visits to maximize her success with intensive lifestyle modifications for her multiple health conditions.   Objective:   Blood pressure 110/71, pulse (!) 50, temperature 97.7 F (36.5 C), temperature source Oral, height 5\' 4"  (1.626 m), weight 187 lb (84.8 kg), SpO2 98 %. Body mass index is 32.1 kg/m.  General: Cooperative, alert, well developed, in no acute distress. HEENT: Conjunctivae and lids unremarkable. Cardiovascular: Regular rhythm.  Lungs: Normal work of breathing. Neurologic: No focal deficits.   Lab Results  Component Value Date   CREATININE 0.78 08/12/2019   BUN 24 08/12/2019   NA 140 08/12/2019   K 4.9 08/12/2019   CL 101 08/12/2019   CO2 22 08/12/2019  Lab Results  Component Value Date   ALT 14 08/12/2019   AST 39 08/12/2019   ALKPHOS 76 08/12/2019   BILITOT 0.3 08/12/2019   Lab Results  Component Value Date   HGBA1C 5.6 04/27/2019   Lab Results  Component Value Date   INSULIN 5.2 08/12/2019   INSULIN 13.3 04/27/2019   Lab Results  Component Value Date   TSH 2.670 08/12/2019   Lab Results  Component Value Date    CHOL 169 08/12/2019   HDL 65 08/12/2019   LDLCALC 92 08/12/2019   TRIG 62 08/12/2019   CHOLHDL 2.6 08/12/2019   Lab Results  Component Value Date   WBC 8.3 08/12/2019   HGB 14.2 08/12/2019   HCT 42.5 08/12/2019   MCV 90 08/12/2019   PLT 342 08/12/2019   Obesity Behavioral Intervention:   Approximately 15 minutes were spent on the discussion below.  ASK: We discussed the diagnosis of obesity with Linda Rice today and Linda Rice agreed to give [place] permission to discuss obesity behavioral modification therapy today.  ASSESS: Linda Rice has the diagnosis of obesity and her BMI today is 32.1. Linda Rice is in the action stage of change.   ADVISE: Linda Rice was educated on the multiple health risks of obesity as well as the benefit of weight loss to improve her health. She was advised of the need for long term treatment and the importance of lifestyle modifications to improve her current health and to decrease her risk of future health problems.  AGREE: Multiple dietary modification options and treatment options were discussed and Brocha agreed to follow the recommendations documented in the above note.  ARRANGE: Virlee was educated on the importance of frequent visits to treat obesity as outlined per CMS and USPSTF guidelines and agreed to schedule her next follow up appointment today.  Attestation Statements:   Reviewed by clinician on day of visit: allergies, medications, problem list, medical history, surgical history, family history, social history, and previous encounter notes.  I, Water quality scientist, CMA, am acting as Location manager for [employer], DO.  I have reviewed the above documentation for accuracy and completeness, and I agree with the above. [name], DO

## 2019-09-23 DIAGNOSIS — N3030 Trigonitis without hematuria: Secondary | ICD-10-CM | POA: Diagnosis not present

## 2019-09-30 DIAGNOSIS — N3030 Trigonitis without hematuria: Secondary | ICD-10-CM | POA: Diagnosis not present

## 2019-10-07 DIAGNOSIS — N3030 Trigonitis without hematuria: Secondary | ICD-10-CM | POA: Diagnosis not present

## 2019-10-09 DIAGNOSIS — M179 Osteoarthritis of knee, unspecified: Secondary | ICD-10-CM | POA: Diagnosis not present

## 2019-10-09 DIAGNOSIS — I4891 Unspecified atrial fibrillation: Secondary | ICD-10-CM | POA: Diagnosis not present

## 2019-10-09 DIAGNOSIS — M8580 Other specified disorders of bone density and structure, unspecified site: Secondary | ICD-10-CM | POA: Diagnosis not present

## 2019-10-09 DIAGNOSIS — E039 Hypothyroidism, unspecified: Secondary | ICD-10-CM | POA: Diagnosis not present

## 2019-10-09 DIAGNOSIS — I10 Essential (primary) hypertension: Secondary | ICD-10-CM | POA: Diagnosis not present

## 2019-10-09 DIAGNOSIS — F329 Major depressive disorder, single episode, unspecified: Secondary | ICD-10-CM | POA: Diagnosis not present

## 2019-10-09 DIAGNOSIS — E7800 Pure hypercholesterolemia, unspecified: Secondary | ICD-10-CM | POA: Diagnosis not present

## 2019-10-09 DIAGNOSIS — J4520 Mild intermittent asthma, uncomplicated: Secondary | ICD-10-CM | POA: Diagnosis not present

## 2019-10-11 ENCOUNTER — Other Ambulatory Visit: Payer: Self-pay | Admitting: Pediatrics

## 2019-10-12 NOTE — Telephone Encounter (Signed)
Refill for montelukast sent to pharmacy x 1 with no refills. She is due for a 6 month follow up. Patient called and appointment made for June.

## 2019-10-13 DIAGNOSIS — N3030 Trigonitis without hematuria: Secondary | ICD-10-CM | POA: Diagnosis not present

## 2019-10-14 ENCOUNTER — Ambulatory Visit (INDEPENDENT_AMBULATORY_CARE_PROVIDER_SITE_OTHER): Payer: Medicare Other | Admitting: Family Medicine

## 2019-10-14 ENCOUNTER — Encounter (INDEPENDENT_AMBULATORY_CARE_PROVIDER_SITE_OTHER): Payer: Self-pay | Admitting: Family Medicine

## 2019-10-14 ENCOUNTER — Other Ambulatory Visit: Payer: Self-pay

## 2019-10-14 VITALS — BP 93/66 | HR 77 | Temp 98.0°F | Ht 64.0 in | Wt 184.0 lb

## 2019-10-14 DIAGNOSIS — Z6831 Body mass index (BMI) 31.0-31.9, adult: Secondary | ICD-10-CM

## 2019-10-14 DIAGNOSIS — E669 Obesity, unspecified: Secondary | ICD-10-CM

## 2019-10-14 DIAGNOSIS — I10 Essential (primary) hypertension: Secondary | ICD-10-CM

## 2019-10-14 DIAGNOSIS — N3030 Trigonitis without hematuria: Secondary | ICD-10-CM | POA: Diagnosis not present

## 2019-10-14 DIAGNOSIS — E8881 Metabolic syndrome: Secondary | ICD-10-CM | POA: Diagnosis not present

## 2019-10-14 NOTE — Progress Notes (Signed)
Chief Complaint:   OBESITY Linda Rice is here to discuss her progress with her obesity treatment plan along with follow-up of her obesity related diagnoses. Linda Rice is on the Category 2 Plan and states she is following her eating plan approximately 85-90% of the time. Linda Rice states she is is walking the dog and going to the gym for 45 minutes 7 times per week.  Today's visit was #: 12 Starting weight: 213 lbs Starting date: 04/13/2019 Today's weight: 184 lbs Today's date: 10/14/2019 Total lbs lost to date: 29 Total lbs lost since last in-office visit: 3  Interim History: Linda Rice has converted over to journaling plan via guidance from [employer], and she has been eating between 1000-1300 calories per day and a minimum of 80 grams of protein per day. She continues to be closely followed by Urology.  Subjective:   1. Insulin resistance Linda Rice's insulin level on 08/12/2019 was 5.2, and A1c on 04/27/2019 was 5.6. She is currently on metformin 500 mg with breakfast.  2. Essential hypertension Linda Rice's blood pressure at her office visit today was 93/66, unable to check on the other arm. She denies headaches, dizziness, or fatigue. She is currently on amlodipine 5 mg q daily.  Assessment/Plan:   1. Insulin resistance Linda Rice will continue metformin, and will continue to work on weight loss, exercise, and decreasing simple carbohydrates to help decrease the risk of diabetes. Linda Rice agreed to follow-up with [place] as directed to closely monitor her progress.  2. Essential hypertension Linda Rice is working on healthy weight loss and exercise to improve blood pressure control. We will watch for signs of hypotension as she continues her lifestyle modifications. Linda Rice is to check her blood pressure at home several times per week, and if symptoms of hypotension develops she is to contact her primary care physician.  3. Class 1 obesity with serious comorbidity and body mass index (BMI) of 31.0 to  31.9 in adult, unspecified obesity type Linda Rice is currently in the action stage of change. As such, her goal is to continue with weight loss efforts. She has agreed to change to keeping a food journal and adhering to recommended goals of 1100-1300 calories and 75+ grams of protein daily.   Exercise goals: As is.  Behavioral modification strategies: no skipping meals, planning for success and keeping a strict food journal.  Deziah has agreed to follow-up with our clinic in 2 to 3 weeks with Dr. Juleen China. She was informed of the importance of frequent follow-up visits to maximize her success with intensive lifestyle modifications for her multiple health conditions.   Objective:   Blood pressure 93/66, pulse 77, temperature 98 F (36.7 C), temperature source Oral, height 5\' 4"  (1.626 m), weight 184 lb (83.5 kg), SpO2 97 %. Body mass index is 31.58 kg/m.  General: Cooperative, alert, well developed, in no acute distress. HEENT: Conjunctivae and lids unremarkable. Cardiovascular: Regular rhythm.  Lungs: Normal work of breathing. Neurologic: No focal deficits.   Lab Results  Component Value Date   CREATININE 0.78 08/12/2019   BUN 24 08/12/2019   NA 140 08/12/2019   K 4.9 08/12/2019   CL 101 08/12/2019   CO2 22 08/12/2019   Lab Results  Component Value Date   ALT 14 08/12/2019   AST 39 08/12/2019   ALKPHOS 76 08/12/2019   BILITOT 0.3 08/12/2019   Lab Results  Component Value Date   HGBA1C 5.6 04/27/2019   Lab Results  Component Value Date   INSULIN 5.2 08/12/2019  INSULIN 13.3 04/27/2019   Lab Results  Component Value Date   TSH 2.670 08/12/2019   Lab Results  Component Value Date   CHOL 169 08/12/2019   HDL 65 08/12/2019   LDLCALC 92 08/12/2019   TRIG 62 08/12/2019   CHOLHDL 2.6 08/12/2019   Lab Results  Component Value Date   WBC 8.3 08/12/2019   HGB 14.2 08/12/2019   HCT 42.5 08/12/2019   MCV 90 08/12/2019   PLT 342 08/12/2019   No results found for:  IRON, TIBC, FERRITIN  Attestation Statements:   Reviewed by clinician on day of visit: allergies, medications, problem list, medical history, surgical history, family history, social history, and previous encounter notes.  Time spent on visit including pre-visit chart review and post-visit care and charting was 24 minutes.    I, Linda Rice, am acting as transcriptionist for Dennard Nip, MD.  I have reviewed the above documentation for accuracy and completeness, and I agree with the above. -  Dennard Nip, MD

## 2019-10-21 DIAGNOSIS — N3030 Trigonitis without hematuria: Secondary | ICD-10-CM | POA: Diagnosis not present

## 2019-10-22 ENCOUNTER — Other Ambulatory Visit: Payer: Self-pay

## 2019-10-28 DIAGNOSIS — N3030 Trigonitis without hematuria: Secondary | ICD-10-CM | POA: Diagnosis not present

## 2019-11-04 ENCOUNTER — Encounter (INDEPENDENT_AMBULATORY_CARE_PROVIDER_SITE_OTHER): Payer: Self-pay | Admitting: Family Medicine

## 2019-11-04 ENCOUNTER — Other Ambulatory Visit: Payer: Self-pay

## 2019-11-04 ENCOUNTER — Ambulatory Visit (INDEPENDENT_AMBULATORY_CARE_PROVIDER_SITE_OTHER): Payer: Medicare Other | Admitting: Family Medicine

## 2019-11-04 VITALS — BP 107/60 | HR 50 | Temp 97.5°F | Ht 64.0 in | Wt 180.0 lb

## 2019-11-04 DIAGNOSIS — E559 Vitamin D deficiency, unspecified: Secondary | ICD-10-CM

## 2019-11-04 DIAGNOSIS — Z6831 Body mass index (BMI) 31.0-31.9, adult: Secondary | ICD-10-CM | POA: Diagnosis not present

## 2019-11-04 DIAGNOSIS — E8881 Metabolic syndrome: Secondary | ICD-10-CM | POA: Diagnosis not present

## 2019-11-04 DIAGNOSIS — E669 Obesity, unspecified: Secondary | ICD-10-CM

## 2019-11-04 DIAGNOSIS — I10 Essential (primary) hypertension: Secondary | ICD-10-CM

## 2019-11-04 DIAGNOSIS — N3030 Trigonitis without hematuria: Secondary | ICD-10-CM | POA: Diagnosis not present

## 2019-11-04 NOTE — Progress Notes (Signed)
Chief Complaint:   OBESITY Linda Rice is here to discuss her progress with her obesity treatment plan along with follow-up of her obesity related diagnoses. Linda Rice is on keeping a food journal and adhering to recommended goals of 1100-1300 calories and 75+ grams of protein and states she is following her eating plan approximately 90% of the time. Linda Rice states she is walking 7 days a week and doing leg exercises 2-3 times per week.  Today's visit was #: 13 Starting weight: 213 lbs Starting date: 04/13/2019 Today's weight: 180 lbs Today's date: 11/04/2019 Total lbs lost to date: 33 lbs Total lbs lost since last in-office visit: 4 lbs  Interim History: Linda Rice is down 33 pounds.  She says she is still happy with the plan.  Subjective:   1. Insulin resistance Linda Rice has a diagnosis of insulin resistance based on her elevated fasting insulin level >5. She continues to work on diet and exercise to decrease her risk of diabetes.  Lab Results  Component Value Date   INSULIN 5.2 08/12/2019   INSULIN 13.3 04/27/2019   Lab Results  Component Value Date   HGBA1C 5.6 04/27/2019   2. Vitamin D deficiency Linda Rice's Vitamin D level was 53.9 on 08/12/2019. She is currently taking OTC vitamin D 5000 IU each day. She denies nausea, vomiting or muscle weakness.  3. Essential hypertension Review: taking medications as instructed, no medication side effects noted, no chest pain on exertion, no dyspnea on exertion, no swelling of ankles.  Blood pressure is low today.  She has no orthostatic symptoms.  BP Readings from Last 3 Encounters:  11/04/19 107/60  10/14/19 93/66  09/21/19 110/71   Assessment/Plan:   1. Insulin resistance Linda Rice will continue to work on weight loss, exercise, and decreasing simple carbohydrates to help decrease the risk of diabetes. Linda Rice agreed to follow-up with [place] as directed to closely monitor her progress.  2. Vitamin D deficiency Low Vitamin D level contributes to  fatigue and are associated with obesity, breast, and colon cancer. She agrees to continue to take OTC Vitamin D @5 ,000 IU daily and will follow-up for routine testing of Vitamin D, at least 2-3 times per year to avoid over-replacement.  3. Essential hypertension Linda Rice is working on healthy weight loss and exercise to improve blood pressure control. We will watch for signs of hypotension as she continues her lifestyle modifications.  4. Class 1 obesity with serious comorbidity and body mass index (BMI) of 31.0 to 31.9 in adult, unspecified obesity type Linda Rice is currently in the action stage of change. As such, her goal is to continue with weight loss efforts. She has agreed to keeping a food journal and adhering to recommended goals of 1100-1300 calories and 75 grams of protein.   Exercise goals: As is.  Behavioral modification strategies: increasing water intake.  Linda Rice has agreed to follow-up with our clinic in 2 weeks. She was informed of the importance of frequent follow-up visits to maximize her success with intensive lifestyle modifications for her multiple health conditions.   Objective:   Blood pressure 107/60, pulse (!) 50, temperature (!) 97.5 F (36.4 C), temperature source Oral, height 5\' 4"  (1.626 m), weight 180 lb (81.6 kg), SpO2 95 %. Body mass index is 30.9 kg/m.  General: Cooperative, alert, well developed, in no acute distress. HEENT: Conjunctivae and lids unremarkable. Cardiovascular: Regular rhythm.  Lungs: Normal work of breathing. Neurologic: No focal deficits.   Lab Results  Component Value Date   CREATININE 0.78 08/12/2019   BUN  24 08/12/2019   NA 140 08/12/2019   K 4.9 08/12/2019   CL 101 08/12/2019   CO2 22 08/12/2019   Lab Results  Component Value Date   ALT 14 08/12/2019   AST 39 08/12/2019   ALKPHOS 76 08/12/2019   BILITOT 0.3 08/12/2019   Lab Results  Component Value Date   HGBA1C 5.6 04/27/2019   Lab Results  Component Value Date    INSULIN 5.2 08/12/2019   INSULIN 13.3 04/27/2019   Lab Results  Component Value Date   TSH 2.670 08/12/2019   Lab Results  Component Value Date   CHOL 169 08/12/2019   HDL 65 08/12/2019   LDLCALC 92 08/12/2019   TRIG 62 08/12/2019   CHOLHDL 2.6 08/12/2019   Lab Results  Component Value Date   WBC 8.3 08/12/2019   HGB 14.2 08/12/2019   HCT 42.5 08/12/2019   MCV 90 08/12/2019   PLT 342 08/12/2019   Obesity Behavioral Intervention:   Approximately 15 minutes were spent on the discussion below.  ASK: We discussed the diagnosis of obesity with Linda Rice today and Linda Rice agreed to give [place] permission to discuss obesity behavioral modification therapy today.  ASSESS: Linda Rice has the diagnosis of obesity and her BMI today is 31.0. Linda Rice is in the action stage of change.   ADVISE: Linda Rice was educated on the multiple health risks of obesity as well as the benefit of weight loss to improve her health. She was advised of the need for long term treatment and the importance of lifestyle modifications to improve her current health and to decrease her risk of future health problems.  AGREE: Multiple dietary modification options and treatment options were discussed and Linda Rice agreed to follow the recommendations documented in the above note.  ARRANGE: Linda Rice was educated on the importance of frequent visits to treat obesity as outlined per CMS and USPSTF guidelines and agreed to schedule her next follow up appointment today.  Attestation Statements:   Reviewed by clinician on day of visit: allergies, medications, problem list, medical history, surgical history, family history, social history, and previous encounter notes.  I, Water quality scientist, CMA, am acting as Location manager for [employer], DO.  I have reviewed the above documentation for accuracy and completeness, and I agree with the above. [name], DO

## 2019-11-11 DIAGNOSIS — N3030 Trigonitis without hematuria: Secondary | ICD-10-CM | POA: Diagnosis not present

## 2019-11-17 ENCOUNTER — Ambulatory Visit (INDEPENDENT_AMBULATORY_CARE_PROVIDER_SITE_OTHER): Payer: Medicare Other | Admitting: Family Medicine

## 2019-11-17 ENCOUNTER — Encounter: Payer: Self-pay | Admitting: Family Medicine

## 2019-11-17 ENCOUNTER — Other Ambulatory Visit: Payer: Self-pay

## 2019-11-17 ENCOUNTER — Other Ambulatory Visit: Payer: Self-pay | Admitting: Family Medicine

## 2019-11-17 VITALS — BP 118/60 | HR 74 | Temp 98.3°F | Resp 16 | Ht 63.39 in | Wt 182.5 lb

## 2019-11-17 DIAGNOSIS — T7800XD Anaphylactic reaction due to unspecified food, subsequent encounter: Secondary | ICD-10-CM

## 2019-11-17 DIAGNOSIS — J4530 Mild persistent asthma, uncomplicated: Secondary | ICD-10-CM | POA: Insufficient documentation

## 2019-11-17 DIAGNOSIS — I499 Cardiac arrhythmia, unspecified: Secondary | ICD-10-CM | POA: Diagnosis not present

## 2019-11-17 DIAGNOSIS — R011 Cardiac murmur, unspecified: Secondary | ICD-10-CM

## 2019-11-17 DIAGNOSIS — L2089 Other atopic dermatitis: Secondary | ICD-10-CM

## 2019-11-17 DIAGNOSIS — J301 Allergic rhinitis due to pollen: Secondary | ICD-10-CM | POA: Diagnosis not present

## 2019-11-17 MED ORDER — MONTELUKAST SODIUM 10 MG PO TABS: ORAL_TABLET | ORAL | 5 refills | Status: DC

## 2019-11-17 MED ORDER — ALBUTEROL SULFATE HFA 108 (90 BASE) MCG/ACT IN AERS: 1.0000 | INHALATION_SPRAY | Freq: Four times a day (QID) | RESPIRATORY_TRACT | 1 refills | Status: AC | PRN

## 2019-11-17 MED ORDER — EPINEPHRINE 0.3 MG/0.3ML IJ SOAJ: 0.3000 mg | INTRAMUSCULAR | 1 refills | Status: AC | PRN

## 2019-11-17 NOTE — Patient Instructions (Addendum)
Allergic rhinitis Continue Xyzal 5 mg-take 1 tablet once a day  for runny nose or itchy eyes or itching Continue fluticasone 2 sprays per nostril once a day if needed for stuffy nose For dry nostrils begin saline nasal gel as needed  Allergic conjunctivitis Opcon-A-1 drop 3 times a day if needed for itchy eyes  Asthma Continue montelukast 10 mg-take 1 tablet once a day to prevent coughing or wheezing. Ventolin 2 puffs every 4 hours if needed for wheezing or coughing spells.   You may use Ventolin 2 puffs 5 to 15 minutes before exercise. For asthma flare, begin Flovent 110-2 puffs twice a day with a spacer for 2 weeks or until cough and wheeze free  Food allergy Continue avoiding peanuts, tree nuts, egg, chicken, [place], fish.  If she has an allergic reaction take Benadryl 50 mg every 4 hours and if she has life-threatening symptoms inject with EpiPen 0.3 mg  Atopic dermaitiis Continue a daily moisturizing routine For red, itchy areas you may use hydrocortisone 1% twice a day as needed  Continue on your medications as listed in the chart  Call us if you are not doing well on this treatment plan  Follow up in 6 months or sooner if needed.

## 2019-11-17 NOTE — Progress Notes (Addendum)
[place] [id number] Dept: [phone]  FOLLOW UP NOTE  Patient ID: Linda Rice, female    DOB: 02-11-1941  Age: 79 y.o. MRN: [id number] Date of Office Visit: 11/17/2019  Assessment  Chief Complaint: Allergic Rhinitis  and Asthma  HPI Tijana Walder is a 79 year old female who presents to the clinic for follow-up visit.  She was last seen in this clinic on 05/15/2019 by Dr. Renaldo Reel for evaluation of asthma, allergic rhinitis, allergic conjunctivitis atopic dermatitis, and food allergy to peanut, tree nut, egg, chicken, [place], and fish.  Her history includes follicular cystitis, irregular heartbeat, and heart murmur.  At today's visit she reports her asthma has been well controlled with no shortness of breath, cough, and occasional wheeze with vigorous exertion.  She continues montelukast 10 mg once a day and albuterol 2 puffs before exercise with no need for rescue.  She has not needed to start Flovent 110 for asthma flare.  Allergic rhinitis is reported as well controlled this spring with occasional throat clearing and frequent dry nostrils.  She is not currently using Xyzal or Flonase.  Atopic dermatitis is reported as well controlled with 1 red itchy area noted on the left antecubital fossa for which she used hydrocortisone 1% with relief of symptoms.  She continues to avoid peanut, tree nut, egg, chicken, [place], and fish.  She reports she occasionally eats very limited amount of products containing baked eggs with no adverse reaction.  Her EpiPen is up-to-date.  Her current medications are listed in the chart.  Drug Allergies:  Allergies  Allergen Reactions   Eggs Or Egg-Derived Products Hives    eczema Poultry Rash and GI symptoms   Erythromycin Other (See Comments)    REACTION: stomach and hives REACTION: stomach symptoms  and hives   Fish Allergy Other (See Comments) and Swelling    Lip swelling and eczema With fins hives    Gatifloxacin  Hives, Nausea Only and Swelling   Other Anaphylaxis, Itching and Swelling    Legumes- all beans and peas And tree nuts  Lip swelling and eczema Legumes- all beans and peas, peanuts Orange Vegetables    Peanut-Containing Drug Products Anaphylaxis    And tree nuts    Poultry Meal Itching   Banana Itching   Keflex [Cephalexin] Rash   Latex Itching    Physical Exam: BP 118/60    Pulse 74    Temp 98.3 F (36.8 C) (Oral)    Resp 16    Ht 5' 3.39" (1.61 m)    Wt 182 lb 8.7 oz (82.8 kg)    SpO2 96%    BMI 31.94 kg/m    Physical Exam Vitals reviewed.  Constitutional:      Appearance: Normal appearance.  HENT:     Head: Normocephalic and atraumatic.     Right Ear: Tympanic membrane normal.     Left Ear: Tympanic membrane normal.     Nose:     Comments: Bilateral nares slightly erythematous with no nasal drainage noted.  Septal deviation noted pharynx normal.  Ears normal.  Eyes normal.    Mouth/Throat:     Pharynx: Oropharynx is clear.  Eyes:     Conjunctiva/sclera: Conjunctivae normal.  Cardiovascular:     Rate and Rhythm: Normal rate. Rhythm irregular.     Heart sounds: Murmur present.  Pulmonary:     Effort: Pulmonary effort is normal.     Breath sounds: Normal breath sounds.  Comments: Lungs clear to auscultation Musculoskeletal:        General: Normal range of motion.     Cervical back: Normal range of motion and neck supple.  Skin:    General: Skin is warm.     Comments: Slight erythema noted on right antecubital fossa.  No open areas noted  Neurological:     Mental Status: She is alert and oriented to person, place, and time.  Psychiatric:        Mood and Affect: Mood normal.        Behavior: Behavior normal.        Thought Content: Thought content normal.        Judgment: Judgment normal.    Diagnostics: FVC 2.72, FEV1 1.77.  Predicted FVC 2.61.  Predicted FEV1 1.95.  Spirometry indicates mild airway obstruction.  This is consistent with previous  spirometry readings.  Assessment and Plan: 1. Mild persistent asthma without complication   2. Anaphylactic reaction due to food, subsequent encounter   3. Flexural atopic dermatitis   4. Irregular heartbeat   5. Heart murmur, systolic   6. Seasonal allergic rhinitis due to pollen     Meds ordered this encounter  Medications   DISCONTD: montelukast (SINGULAIR) 10 MG tablet    Sig: TAKE 1 TABLET BY MOUTH ONCE DAILY TO PREVENT COUGHING OR WHEEZING    Dispense:  30 tablet    Refill:  5   EPINEPHrine 0.3 mg/0.3 mL IJ SOAJ injection    Sig: Inject 0.3 mLs (0.3 mg total) into the muscle as needed for anaphylaxis.    Dispense:  2 each    Refill:  1    Please dispense [employer] brand generic only. Thank you.   albuterol (VENTOLIN HFA) 108 (90 Base) MCG/ACT inhaler    Sig: Inhale 1-2 puffs into the lungs every 6 (six) hours as needed for wheezing or shortness of breath.    Dispense:  18 g    Refill:  1   montelukast (SINGULAIR) 10 MG tablet    Sig: TAKE 1 TABLET BY MOUTH ONCE DAILY TO PREVENT COUGHING OR WHEEZING    Dispense:  30 tablet    Refill:  5    Patient Instructions  Allergic rhinitis Continue Xyzal 5 mg-take 1 tablet once a day  for runny nose or itchy eyes or itching Continue fluticasone 2 sprays per nostril once a day if needed for stuffy nose For dry nostrils begin saline nasal gel as needed  Allergic conjunctivitis Opcon-A-1 drop 3 times a day if needed for itchy eyes  Asthma Continue montelukast 10 mg-take 1 tablet once a day to prevent coughing or wheezing. Ventolin 2 puffs every 4 hours if needed for wheezing or coughing spells.   You may use Ventolin 2 puffs 5 to 15 minutes before exercise. For asthma flare, begin Flovent 110-2 puffs twice a day with a spacer for 2 weeks or until cough and wheeze free  Food allergy Continue avoiding peanuts, tree nuts, egg, chicken, [place], fish.  If she has an allergic reaction take Benadryl 50 mg every 4 hours and if she has  life-threatening symptoms inject with EpiPen 0.3 mg  Atopic dermaitiis Continue a daily moisturizing routine For red, itchy areas you may use hydrocortisone 1% twice a day as needed  Continue on your medications as listed in the chart  Call us if you are not doing well on this treatment plan  Follow up in 6 months or sooner if needed.  Return in about 6 months (around 05/18/2020), or if symptoms worsen or fail to improve.    Thank you for the opportunity to care for this patient.  Please do not hesitate to contact me with questions.  Gareth Morgan, FNP [facility]  ________________________________________________  I have provided oversight concerning [facility]'s evaluation and treatment of this patient's health issues addressed during today's encounter.  I agree with the assessment and therapeutic plan as outlined in the note.   Signed,   R Edgar Frisk, MD

## 2019-11-18 DIAGNOSIS — N3030 Trigonitis without hematuria: Secondary | ICD-10-CM | POA: Diagnosis not present

## 2019-11-30 ENCOUNTER — Ambulatory Visit (INDEPENDENT_AMBULATORY_CARE_PROVIDER_SITE_OTHER): Payer: Medicare Other | Admitting: Family Medicine

## 2019-11-30 ENCOUNTER — Other Ambulatory Visit: Payer: Self-pay

## 2019-11-30 ENCOUNTER — Encounter (INDEPENDENT_AMBULATORY_CARE_PROVIDER_SITE_OTHER): Payer: Self-pay | Admitting: Family Medicine

## 2019-11-30 VITALS — BP 101/69 | HR 62 | Temp 97.5°F | Ht 64.0 in | Wt 176.0 lb

## 2019-11-30 DIAGNOSIS — E559 Vitamin D deficiency, unspecified: Secondary | ICD-10-CM | POA: Diagnosis not present

## 2019-11-30 DIAGNOSIS — E038 Other specified hypothyroidism: Secondary | ICD-10-CM | POA: Diagnosis not present

## 2019-11-30 DIAGNOSIS — R350 Frequency of micturition: Secondary | ICD-10-CM

## 2019-11-30 DIAGNOSIS — E669 Obesity, unspecified: Secondary | ICD-10-CM | POA: Diagnosis not present

## 2019-11-30 DIAGNOSIS — E538 Deficiency of other specified B group vitamins: Secondary | ICD-10-CM

## 2019-11-30 DIAGNOSIS — Z6830 Body mass index (BMI) 30.0-30.9, adult: Secondary | ICD-10-CM

## 2019-11-30 DIAGNOSIS — I10 Essential (primary) hypertension: Secondary | ICD-10-CM

## 2019-11-30 DIAGNOSIS — E7849 Other hyperlipidemia: Secondary | ICD-10-CM | POA: Diagnosis not present

## 2019-11-30 NOTE — Progress Notes (Signed)
Chief Complaint:   OBESITY Linda Rice is here to discuss her progress with her obesity treatment plan along with follow-up of her obesity related diagnoses. Linda Rice is on keeping a food journal and adhering to recommended goals of 1100-1300 calories and 75 grams of protein and states she is following her eating plan approximately 92% of the time. Linda Rice states she is walking for 45-60 minutes 7 times per week.  Today's visit was #: 14 Starting weight: 213 lbs Starting date: 04/13/2019 Today's weight: 176 lbs Today's date: 11/30/2019 Total lbs lost to date: 37 lbs Total lbs lost since last in-office visit: 4 lbs  Interim History: Linda Rice has lost 37 pounds!  She is happy with the plan.  Her BMI is 30.  Reviewed maintenance plan.  Subjective:   1. Essential hypertension Review: taking medications as instructed, no medication side effects noted, no chest pain on exertion, no dyspnea on exertion, no swelling of ankles.   BP Readings from Last 3 Encounters:  11/30/19 101/69  11/17/19 118/60  11/04/19 107/60   2. Vitamin D deficiency Linda Rice's Vitamin D level was 53.9 on 08/12/2019. She is currently taking prescription vitamin D 50,000 IU each week. She denies nausea, vomiting or muscle weakness.  3. Other hyperlipidemia Linda Rice has hyperlipidemia and has been trying to improve her cholesterol levels with intensive lifestyle modification including a low saturated fat diet, exercise and weight loss. She denies any chest pain, claudication or myalgias.  Lab Results  Component Value Date   ALT 14 08/12/2019   AST 39 08/12/2019   ALKPHOS 76 08/12/2019   BILITOT 0.3 08/12/2019   Lab Results  Component Value Date   CHOL 169 08/12/2019   HDL 65 08/12/2019   LDLCALC 92 08/12/2019   TRIG 62 08/12/2019   CHOLHDL 2.6 08/12/2019   4. Other specified hypothyroidism Linda Rice is taking levothyroxine 100 mcg daily.  Lab Results  Component Value Date   TSH 2.670 08/12/2019   5. B12  deficiency She does not have a previous diagnosis of pernicious anemia.  She does not have a history of weight loss surgery.   6. Urinary frequency Linda Rice complains of urinary frequency today.  Assessment/Plan:   1. Essential hypertension Chena is working on healthy weight loss and exercise to improve blood pressure control. We will watch for signs of hypotension as she continues her lifestyle modifications.  Orders - Comprehensive metabolic panel - CBC with Differential/Platelet - Insulin, random - Lipid Panel With LDL/HDL Ratio - T3 - T4, free - TSH  2. Vitamin D deficiency Low Vitamin D level contributes to fatigue and are associated with obesity, breast, and colon cancer. She agrees to continue to take prescription Vitamin D @50 ,000 IU every week and will follow-up for routine testing of Vitamin D, at least 2-3 times per year to avoid over-replacement.  Orders - VITAMIN D 25 Hydroxy (Vit-D Deficiency, Fractures)  3. Other hyperlipidemia Cardiovascular risk and specific lipid/LDL goals reviewed.  We discussed several lifestyle modifications today and Fionnuala will continue to work on diet, exercise and weight loss efforts. Orders and follow up as documented in patient record.   Counseling Intensive lifestyle modifications are the first line treatment for this issue. . Dietary changes: Increase soluble fiber. Decrease simple carbohydrates. . Exercise changes: Moderate to vigorous-intensity aerobic activity 150 minutes per week if tolerated. . Lipid-lowering medications: see documented in medical record.  Orders - Lipid Panel With LDL/HDL Ratio  4. Other specified hypothyroidism Patient with long-standing hypothyroidism, on levothyroxine therapy.  She appears euthyroid. Orders and follow up as documented in patient record.  Counseling . Good thyroid control is important for overall health. Supratherapeutic thyroid levels are dangerous and will not improve weight loss  results. . The correct way to take levothyroxine is fasting, with water, separated by at least 30 minutes from breakfast, and separated by more than 4 hours from calcium, iron, multivitamins, acid reflux medications (PPIs).  - T3 - T4, free - TSH  5. B12 deficiency The diagnosis was reviewed with the patient. Counseling provided today, see below. We will continue to monitor. Orders and follow up as documented in patient record.  Counseling . The body needs vitamin B12: to make red blood cells; to make DNA; and to help the nerves work properly so they can carry messages from the brain to the body.  . The main causes of vitamin B12 deficiency include dietary deficiency, digestive diseases, pernicious anemia, and having a surgery in which part of the stomach or small intestine is removed.  . Certain medicines can make it harder for the body to absorb vitamin B12. These medicines include: heartburn medications; some antibiotics; some medications used to treat diabetes, gout, and high cholesterol.  . In some cases, there are no symptoms of this condition. If the condition leads to anemia or nerve damage, various symptoms can occur, such as weakness or fatigue, shortness of breath, and numbness or tingling in your hands and feet.   . Treatment:  o May include taking vitamin B12 supplements.  o Avoid alcohol.  o Eat lots of healthy foods that contain vitamin B12: - Beef, pork, chicken, [place], and organ meats, such as liver.  - Seafood: This includes clams, rainbow trout, salmon, tuna, and haddock. Eggs.  - Cereal and dairy products that are fortified: This means that vitamin B12 has been added to the food.   Orders - Vitamin B12  6. Urinary frequency Will check UA and culture today.  Orders - Urinalysis, Routine w reflex microscopic - Urine Culture  7. Class 1 obesity with serious comorbidity and body mass index (BMI) of 30.0 to 30.9 in adult, unspecified obesity type Linda Rice is currently  in the action stage of change. As such, her goal is to continue with weight loss efforts. She has agreed to keeping a food journal and adhering to recommended goals of 1300-1800 calories and 75+ grams of protein.   Exercise goals: As is.  Behavioral modification strategies: meal planning and cooking strategies.  Linda Rice has agreed to follow-up with our clinic in 3 weeks. She was informed of the importance of frequent follow-up visits to maximize her success with intensive lifestyle modifications for her multiple health conditions.   Linda Rice was informed we would discuss her lab results at her next visit unless there is a critical issue that needs to be addressed sooner. Linda Rice agreed to keep her next visit at the agreed upon time to discuss these results.  Objective:   Blood pressure 101/69, pulse 62, temperature (!) 97.5 F (36.4 C), temperature source Oral, height 5\' 4"  (1.626 m), weight 176 lb (79.8 kg), SpO2 96 %. Body mass index is 30.21 kg/m.  General: Cooperative, alert, well developed, in no acute distress. HEENT: Conjunctivae and lids unremarkable. Cardiovascular: Regular rhythm.  Lungs: Normal work of breathing. Neurologic: No focal deficits.   Lab Results  Component Value Date   CREATININE 0.78 08/12/2019   BUN 24 08/12/2019   NA 140 08/12/2019   K 4.9 08/12/2019   CL 101 08/12/2019  CO2 22 08/12/2019   Lab Results  Component Value Date   ALT 14 08/12/2019   AST 39 08/12/2019   ALKPHOS 76 08/12/2019   BILITOT 0.3 08/12/2019   Lab Results  Component Value Date   HGBA1C 5.6 04/27/2019   Lab Results  Component Value Date   INSULIN 5.2 08/12/2019   INSULIN 13.3 04/27/2019   Lab Results  Component Value Date   TSH 2.670 08/12/2019   Lab Results  Component Value Date   CHOL 169 08/12/2019   HDL 65 08/12/2019   LDLCALC 92 08/12/2019   TRIG 62 08/12/2019   CHOLHDL 2.6 08/12/2019   Lab Results  Component Value Date   WBC 8.3 08/12/2019   HGB 14.2  08/12/2019   HCT 42.5 08/12/2019   MCV 90 08/12/2019   PLT 342 08/12/2019   Obesity Behavioral Intervention:   Approximately 15 minutes were spent on the discussion below.  ASK: We discussed the diagnosis of obesity with Linda Rice today and Linda Rice agreed to give [place] permission to discuss obesity behavioral modification therapy today.  ASSESS: Linda Rice has the diagnosis of obesity and her BMI today is 30.3. Linda Rice is in the action stage of change.   ADVISE: Linda Rice was educated on the multiple health risks of obesity as well as the benefit of weight loss to improve her health. She was advised of the need for long term treatment and the importance of lifestyle modifications to improve her current health and to decrease her risk of future health problems.  AGREE: Multiple dietary modification options and treatment options were discussed and Linda Rice agreed to follow the recommendations documented in the above note.  ARRANGE: Linda Rice was educated on the importance of frequent visits to treat obesity as outlined per CMS and USPSTF guidelines and agreed to schedule her next follow up appointment today.  Attestation Statements:   Reviewed by clinician on day of visit: allergies, medications, problem list, medical history, surgical history, family history, social history, and previous encounter notes.  I, Water quality scientist, CMA, am acting as transcriptionist for [name], DO  I have reviewed the above documentation for accuracy and completeness, and I agree with the above. [name], DO

## 2019-12-01 LAB — COMPREHENSIVE METABOLIC PANEL
ALT: 16 IU/L (ref 0–32)
AST: 43 IU/L — ABNORMAL HIGH (ref 0–40)
Albumin/Globulin Ratio: 1.4 (ref 1.2–2.2)
Albumin: 4 g/dL (ref 3.7–4.7)
Alkaline Phosphatase: 83 IU/L (ref 48–121)
BUN/Creatinine Ratio: 42 — ABNORMAL HIGH (ref 12–28)
BUN: 39 mg/dL — ABNORMAL HIGH (ref 8–27)
Bilirubin Total: 0.4 mg/dL (ref 0.0–1.2)
CO2: 22 mmol/L (ref 20–29)
Calcium: 9.8 mg/dL (ref 8.7–10.3)
Chloride: 103 mmol/L (ref 96–106)
Creatinine, Ser: 0.93 mg/dL (ref 0.57–1.00)
GFR calc Af Amer: 69 mL/min/{1.73_m2} (ref >59)
GFR calc non Af Amer: 59 mL/min/{1.73_m2} — ABNORMAL LOW (ref >59)
Globulin, Total: 2.8 g/dL (ref 1.5–4.5)
Glucose: 87 mg/dL (ref 65–99)
Potassium: 4.7 mmol/L (ref 3.5–5.2)
Sodium: 141 mmol/L (ref 134–144)
Total Protein: 6.8 g/dL (ref 6.0–8.5)

## 2019-12-01 LAB — URINALYSIS, ROUTINE W REFLEX MICROSCOPIC
Bilirubin, UA: NEGATIVE
Glucose, UA: NEGATIVE
Nitrite, UA: NEGATIVE
Specific Gravity, UA: 1.02 (ref 1.005–1.030)
Urobilinogen, Ur: 0.2 mg/dL (ref 0.2–1.0)
pH, UA: 6.5 (ref 5.0–7.5)

## 2019-12-01 LAB — LIPID PANEL WITH LDL/HDL RATIO
Cholesterol, Total: 163 mg/dL (ref 100–199)
HDL: 60 mg/dL (ref >39)
LDL Chol Calc (NIH): 88 mg/dL (ref 0–99)
LDL/HDL Ratio: 1.5 ratio (ref 0.0–3.2)
Triglycerides: 78 mg/dL (ref 0–149)
VLDL Cholesterol Cal: 15 mg/dL (ref 5–40)

## 2019-12-01 LAB — MICROSCOPIC EXAMINATION
Bacteria, UA: NONE SEEN
Casts: NONE SEEN /lpf
RBC, Urine: >30 /hpf — AB (ref 0–2)
WBC, UA: >30 /hpf — AB (ref 0–5)

## 2019-12-01 LAB — T4, FREE: Free T4: 1.59 ng/dL (ref 0.82–1.77)

## 2019-12-01 LAB — CBC WITH DIFFERENTIAL/PLATELET
Basophils Absolute: 0.1 10*3/uL (ref 0.0–0.2)
Basos: 1 %
EOS (ABSOLUTE): 0.3 10*3/uL (ref 0.0–0.4)
Eos: 3 %
Hematocrit: 39.1 % (ref 34.0–46.6)
Hemoglobin: 12.9 g/dL (ref 11.1–15.9)
Immature Grans (Abs): 0 10*3/uL (ref 0.0–0.1)
Immature Granulocytes: 0 %
Lymphocytes Absolute: 2.6 10*3/uL (ref 0.7–3.1)
Lymphs: 29 %
MCH: 30 pg (ref 26.6–33.0)
MCHC: 33 g/dL (ref 31.5–35.7)
MCV: 91 fL (ref 79–97)
Monocytes Absolute: 0.7 10*3/uL (ref 0.1–0.9)
Monocytes: 8 %
Neutrophils Absolute: 5.3 10*3/uL (ref 1.4–7.0)
Neutrophils: 59 %
Platelets: 347 10*3/uL (ref 150–450)
RBC: 4.3 x10E6/uL (ref 3.77–5.28)
RDW: 12.7 % (ref 11.7–15.4)
WBC: 8.9 10*3/uL (ref 3.4–10.8)

## 2019-12-01 LAB — VITAMIN D 25 HYDROXY (VIT D DEFICIENCY, FRACTURES): Vit D, 25-Hydroxy: 50.1 ng/mL (ref 30.0–100.0)

## 2019-12-01 LAB — TSH: TSH: 3.37 u[IU]/mL (ref 0.450–4.500)

## 2019-12-01 LAB — INSULIN, RANDOM: INSULIN: 3.9 u[IU]/mL (ref 2.6–24.9)

## 2019-12-01 LAB — VITAMIN B12: Vitamin B-12: 493 pg/mL (ref 232–1245)

## 2019-12-01 LAB — T3: T3, Total: 70 ng/dL — ABNORMAL LOW (ref 71–180)

## 2019-12-02 LAB — URINE CULTURE: Organism ID, Bacteria: NO GROWTH

## 2019-12-21 ENCOUNTER — Ambulatory Visit (INDEPENDENT_AMBULATORY_CARE_PROVIDER_SITE_OTHER): Payer: Medicare Other | Admitting: Family Medicine

## 2019-12-21 ENCOUNTER — Other Ambulatory Visit: Payer: Self-pay

## 2019-12-21 ENCOUNTER — Encounter (INDEPENDENT_AMBULATORY_CARE_PROVIDER_SITE_OTHER): Payer: Self-pay | Admitting: Family Medicine

## 2019-12-21 VITALS — BP 116/73 | HR 66 | Temp 97.9°F | Ht 64.0 in | Wt 172.0 lb

## 2019-12-21 DIAGNOSIS — N3030 Trigonitis without hematuria: Secondary | ICD-10-CM | POA: Diagnosis not present

## 2019-12-21 DIAGNOSIS — Z6830 Body mass index (BMI) 30.0-30.9, adult: Secondary | ICD-10-CM | POA: Diagnosis not present

## 2019-12-21 DIAGNOSIS — I10 Essential (primary) hypertension: Secondary | ICD-10-CM | POA: Diagnosis not present

## 2019-12-21 DIAGNOSIS — E559 Vitamin D deficiency, unspecified: Secondary | ICD-10-CM | POA: Diagnosis not present

## 2019-12-21 DIAGNOSIS — E038 Other specified hypothyroidism: Secondary | ICD-10-CM | POA: Diagnosis not present

## 2019-12-21 DIAGNOSIS — R7989 Other specified abnormal findings of blood chemistry: Secondary | ICD-10-CM | POA: Diagnosis not present

## 2019-12-21 DIAGNOSIS — E669 Obesity, unspecified: Secondary | ICD-10-CM

## 2019-12-22 NOTE — Progress Notes (Signed)
Chief Complaint:   OBESITY Linda Rice is here to discuss her progress with her obesity treatment plan along with follow-up of her obesity related diagnoses. Linda Rice is on keeping a food journal and adhering to recommended goals of 1100-1300 calories and 75+ grams of protein and states she is following her eating plan approximately 90% of the time. Linda Rice states she has increased her walking for exercise.  Today's visit was #: 15 Starting weight: 213 lbs Starting date: 04/13/2019 Today's weight: 172 lbs Today's date: 12/21/2019 Total lbs lost to date: 41 lbs Total lbs lost since last in-office visit: 4 lbs  Interim History: Linda Rice continues to meet her protein and calorie goals.  She says she is doing more walking.  Her BMI is below 30 today!  She says she will be going on vacation for a few weeks.  We discussed maintenance goals.  Subjective:   1. Essential hypertension Review: taking medications as instructed, no medication side effects noted, no chest pain on exertion, no dyspnea on exertion, no swelling of ankles.   BP Readings from Last 3 Encounters:  12/21/19 116/73  11/30/19 101/69  11/17/19 118/60   2. Vitamin D deficiency Linda Rice's Vitamin D level was 50.1 on 11/30/2019. She is currently taking OTC vitamin D 5000 IU each day. She denies nausea, vomiting or muscle weakness.  3. Other specified hypothyroidism Linda Rice takes levothyroxine 100 mcg daily.  Lab Results  Component Value Date   TSH 3.370 11/30/2019   4. Follicular cystitis Linda Rice is followed by Urology.  Recent note reviewed.  5. Elevated LFTs Linda Rice's AST was elevated slightly at last visit.  She has 1-2 alcoholic drinks per week.    Lab Results  Component Value Date   ALT 16 11/30/2019   AST 43 (H) 11/30/2019   ALKPHOS 83 11/30/2019   BILITOT 0.4 11/30/2019   Assessment/Plan:   1. Essential hypertension Linda Rice is working on healthy weight loss and exercise to improve blood pressure control.  We will watch for signs of hypotension as she continues her lifestyle modifications.  2. Vitamin D deficiency Low Vitamin D level contributes to fatigue and are associated with obesity, breast, and colon cancer. She agrees to continue to take OTC Vitamin D @5 ,000 IU daily and will follow-up for routine testing of Vitamin D, at least 2-3 times per year to avoid over-replacement.  3. Other specified hypothyroidism Patient with long-standing hypothyroidism, on levothyroxine therapy. She appears euthyroid. Orders and follow up as documented in patient record.  Counseling . Good thyroid control is important for overall health. Supratherapeutic thyroid levels are dangerous and will not improve weight loss results. . The correct way to take levothyroxine is fasting, with water, separated by at least 30 minutes from breakfast, and separated by more than 4 hours from calcium, iron, multivitamins, acid reflux medications (PPIs).   4. Follicular cystitis She will continue treatment through Urology.  5. Elevated LFTs Recheck at next blood draw.  6. Class 1 obesity with serious comorbidity and body mass index (BMI) of 30.0 to 30.9 in adult, unspecified obesity type Linda Rice is currently in the action stage of change. As such, her goal is to maintain weight for now. She has agreed to keeping a food journal and adhering to recommended goals of 1600-1700 calories and 85+ grams of protein.   Exercise goals: Older adults should follow the adult guidelines. When older adults cannot meet the adult guidelines, they should be as physically active as their abilities and conditions will allow.  Older adults should do exercises that maintain or improve balance if they are at risk of falling.   Behavioral modification strategies: increasing lean protein intake, decreasing simple carbohydrates, increasing water intake, decreasing sodium intake and increasing high fiber foods.  Linda Rice has agreed to follow-up with our  clinic in 2-3 weeks. She was informed of the importance of frequent follow-up visits to maximize her success with intensive lifestyle modifications for her multiple health conditions.   Objective:   Blood pressure 116/73, pulse 66, temperature 97.9 F (36.6 C), temperature source Oral, height 5\' 4"  (1.626 m), weight 172 lb (78 kg), SpO2 97 %. Body mass index is 29.52 kg/m.  General: Cooperative, alert, well developed, in no acute distress. HEENT: Conjunctivae and lids unremarkable. Cardiovascular: Regular rhythm.  Lungs: Normal work of breathing. Neurologic: No focal deficits.   Lab Results  Component Value Date   CREATININE 0.93 11/30/2019   BUN 39 (H) 11/30/2019   NA 141 11/30/2019   K 4.7 11/30/2019   CL 103 11/30/2019   CO2 22 11/30/2019   Lab Results  Component Value Date   ALT 16 11/30/2019   AST 43 (H) 11/30/2019   ALKPHOS 83 11/30/2019   BILITOT 0.4 11/30/2019   Lab Results  Component Value Date   HGBA1C 5.6 04/27/2019   Lab Results  Component Value Date   INSULIN 3.9 11/30/2019   INSULIN 5.2 08/12/2019   INSULIN 13.3 04/27/2019   Lab Results  Component Value Date   TSH 3.370 11/30/2019   Lab Results  Component Value Date   CHOL 163 11/30/2019   HDL 60 11/30/2019   LDLCALC 88 11/30/2019   TRIG 78 11/30/2019   CHOLHDL 2.6 08/12/2019   Lab Results  Component Value Date   WBC 8.9 11/30/2019   HGB 12.9 11/30/2019   HCT 39.1 11/30/2019   MCV 91 11/30/2019   PLT 347 11/30/2019   Obesity Behavioral Intervention:   Approximately 15 minutes were spent on the discussion below.  ASK: We discussed the diagnosis of obesity with Linda Rice today and Linda Rice agreed to give [place] permission to discuss obesity behavioral modification therapy today.  ASSESS: Linda Rice has the diagnosis of obesity and her BMI today is 29.7. Linda Rice is in the action stage of change.   ADVISE: Linda Rice was educated on the multiple health risks of obesity as well as the benefit of  weight loss to improve her health. She was advised of the need for long term treatment and the importance of lifestyle modifications to improve her current health and to decrease her risk of future health problems.  AGREE: Multiple dietary modification options and treatment options were discussed and Linda Rice agreed to follow the recommendations documented in the above note.  ARRANGE: Linda Rice was educated on the importance of frequent visits to treat obesity as outlined per CMS and USPSTF guidelines and agreed to schedule her next follow up appointment today.  Attestation Statements:   Reviewed by clinician on day of visit: allergies, medications, problem list, medical history, surgical history, family history, social history, and previous encounter notes.  I, Water quality scientist, CMA, am acting as transcriptionist for [name], DO  I have reviewed the above documentation for accuracy and completeness, and I agree with the above. [name], DO

## 2020-01-25 ENCOUNTER — Ambulatory Visit (INDEPENDENT_AMBULATORY_CARE_PROVIDER_SITE_OTHER): Payer: Medicare Other | Admitting: Family Medicine

## 2020-01-25 ENCOUNTER — Encounter (INDEPENDENT_AMBULATORY_CARE_PROVIDER_SITE_OTHER): Payer: Self-pay | Admitting: Family Medicine

## 2020-01-25 ENCOUNTER — Other Ambulatory Visit: Payer: Self-pay

## 2020-01-25 VITALS — BP 121/65 | HR 67 | Temp 98.0°F | Ht 64.0 in | Wt 167.0 lb

## 2020-01-25 DIAGNOSIS — Z6830 Body mass index (BMI) 30.0-30.9, adult: Secondary | ICD-10-CM

## 2020-01-25 DIAGNOSIS — E669 Obesity, unspecified: Secondary | ICD-10-CM | POA: Diagnosis not present

## 2020-01-25 DIAGNOSIS — R7989 Other specified abnormal findings of blood chemistry: Secondary | ICD-10-CM

## 2020-01-25 DIAGNOSIS — I10 Essential (primary) hypertension: Secondary | ICD-10-CM | POA: Diagnosis not present

## 2020-01-25 DIAGNOSIS — E8881 Metabolic syndrome: Secondary | ICD-10-CM | POA: Diagnosis not present

## 2020-01-25 MED ORDER — METFORMIN HCL 500 MG PO TABS: ORAL_TABLET | ORAL | 0 refills | Status: DC

## 2020-01-25 NOTE — Progress Notes (Signed)
Chief Complaint:   OBESITY Linda Rice is here to discuss her progress with her obesity treatment plan along with follow-up of her obesity related diagnoses. Linda Rice is on the Category 2 Plan and states she is following her eating plan approximately 85-90% of the time. Linda Rice states she is she is doing her normal daily walking.  Today's visit was #: 53 Starting weight: 213 lbs Starting date: 04/13/2019 Today's weight: 167 lbs Today's date: 01/25/2020 Total lbs lost to date: 46 lbs Total lbs lost since last in-office visit: 5 lbs Total weight loss percentage to date: 21.60% Body mass index is 28.67 kg/m.   Interim History: Linda Rice says she lost her appetite a few weeks ago but stopped taking metformin and this got better.  She is now using Linda Rice at night and sleeping much longer, more soundly. She admits to not drinking enough water.  She says she is going to [place] for a few weeks.  Assessment/Plan:   1. Elevated LFTs Lab Results  Component Value Date   ALT 16 11/30/2019   AST 43 (H) 11/30/2019   ALKPHOS 83 11/30/2019   BILITOT 0.4 11/30/2019   2. Essential hypertension  BP Readings from Last 3 Encounters:  01/25/20 121/65  12/21/19 116/73  11/30/19 101/69   Lab Results  Component Value Date   CREATININE 0.93 11/30/2019   CREATININE 0.78 08/12/2019   CREATININE 0.62 04/13/2019   The current medical regimen is effective;  continue present plan and medications.  3. Insulin resistance Goal is HgbA1c < 5.7 and insulin level closer to 5. Okay to stop Metformin if it decreases appetite too much at this point. She may restart it in the future if she develops polyphagia.  4. Class 1 obesity with serious comorbidity and body mass index (BMI) of 30.0 to 30.9 in adult, unspecified obesity type Linda Rice is currently in the action stage of change. As such, her goal is to maintain. She has agreed to 1500-1700 calories/> 95 grams of protein.  Exercise goals: Older adults  should follow the adult guidelines. When older adults cannot meet the adult guidelines, they should be as physically active as their abilities and conditions will allow.  Older adults should do exercises that maintain or improve balance if they are at risk of falling.   Behavioral modification strategies: increasing water intake.  Linda Rice has agreed to follow-up with our clinic in 4 weeks. She was informed of the importance of frequent follow-up visits to maximize her success with intensive lifestyle modifications for her multiple health conditions.   Objective:   Blood pressure 121/65, pulse 67, temperature 98 F (36.7 C), temperature source Oral, height 5\' 4"  (1.626 m), weight 167 lb (75.8 kg), SpO2 99 %. Body mass index is 28.67 kg/m.  General: Cooperative, alert, well developed, in no acute distress. HEENT: Conjunctivae and lids unremarkable. Cardiovascular: Regular rhythm.  Lungs: Normal work of breathing. Neurologic: No focal deficits.   Lab Results  Component Value Date   CREATININE 0.93 11/30/2019   BUN 39 (H) 11/30/2019   NA 141 11/30/2019   K 4.7 11/30/2019   CL 103 11/30/2019   CO2 22 11/30/2019   Lab Results  Component Value Date   ALT 16 11/30/2019   AST 43 (H) 11/30/2019   ALKPHOS 83 11/30/2019   BILITOT 0.4 11/30/2019   Lab Results  Component Value Date   HGBA1C 5.6 04/27/2019   Lab Results  Component Value Date   INSULIN 3.9 11/30/2019   INSULIN 5.2 08/12/2019  INSULIN 13.3 04/27/2019   Lab Results  Component Value Date   TSH 3.370 11/30/2019   Lab Results  Component Value Date   CHOL 163 11/30/2019   HDL 60 11/30/2019   LDLCALC 88 11/30/2019   TRIG 78 11/30/2019   CHOLHDL 2.6 08/12/2019   Lab Results  Component Value Date   WBC 8.9 11/30/2019   HGB 12.9 11/30/2019   HCT 39.1 11/30/2019   MCV 91 11/30/2019   PLT 347 11/30/2019   Obesity Behavioral Intervention:   Approximately 15 minutes were spent on the discussion  below.  ASK: We discussed the diagnosis of obesity with Linda Rice today and Linda Rice agreed to give [place] permission to discuss obesity behavioral modification therapy today.  ASSESS: Linda Rice has the diagnosis of obesity and her BMI today is 28.7. Linda Rice is in the action stage of change.   ADVISE: Linda Rice was educated on the multiple health risks of obesity as well as the benefit of weight loss to improve her health. She was advised of the need for long term treatment and the importance of lifestyle modifications to improve her current health and to decrease her risk of future health problems.  AGREE: Multiple dietary modification options and treatment options were discussed and Linda Rice agreed to follow the recommendations documented in the above note.  ARRANGE: Linda Rice was educated on the importance of frequent visits to treat obesity as outlined per CMS and USPSTF guidelines and agreed to schedule her next follow up appointment today.  Attestation Statements:   Reviewed by clinician on day of visit: allergies, medications, problem list, medical history, surgical history, family history, social history, and previous encounter notes.  I, Water quality scientist, CMA, am acting as transcriptionist for [name], DO  I have reviewed the above documentation for accuracy and completeness, and I agree with the above. [name], DO

## 2020-01-27 DIAGNOSIS — N3030 Trigonitis without hematuria: Secondary | ICD-10-CM | POA: Diagnosis not present

## 2020-02-17 DIAGNOSIS — E7800 Pure hypercholesterolemia, unspecified: Secondary | ICD-10-CM | POA: Diagnosis not present

## 2020-02-17 DIAGNOSIS — Z1389 Encounter for screening for other disorder: Secondary | ICD-10-CM | POA: Diagnosis not present

## 2020-02-17 DIAGNOSIS — I10 Essential (primary) hypertension: Secondary | ICD-10-CM | POA: Diagnosis not present

## 2020-02-17 DIAGNOSIS — Z0000 Encounter for general adult medical examination without abnormal findings: Secondary | ICD-10-CM | POA: Diagnosis not present

## 2020-02-17 DIAGNOSIS — E039 Hypothyroidism, unspecified: Secondary | ICD-10-CM | POA: Diagnosis not present

## 2020-02-17 DIAGNOSIS — Z7189 Other specified counseling: Secondary | ICD-10-CM | POA: Diagnosis not present

## 2020-02-17 DIAGNOSIS — N3946 Mixed incontinence: Secondary | ICD-10-CM | POA: Diagnosis not present

## 2020-02-17 DIAGNOSIS — J302 Other seasonal allergic rhinitis: Secondary | ICD-10-CM | POA: Diagnosis not present

## 2020-02-17 DIAGNOSIS — J4520 Mild intermittent asthma, uncomplicated: Secondary | ICD-10-CM | POA: Diagnosis not present

## 2020-02-19 DIAGNOSIS — M179 Osteoarthritis of knee, unspecified: Secondary | ICD-10-CM | POA: Diagnosis not present

## 2020-02-19 DIAGNOSIS — E7800 Pure hypercholesterolemia, unspecified: Secondary | ICD-10-CM | POA: Diagnosis not present

## 2020-02-19 DIAGNOSIS — J4520 Mild intermittent asthma, uncomplicated: Secondary | ICD-10-CM | POA: Diagnosis not present

## 2020-02-19 DIAGNOSIS — M8580 Other specified disorders of bone density and structure, unspecified site: Secondary | ICD-10-CM | POA: Diagnosis not present

## 2020-02-19 DIAGNOSIS — F329 Major depressive disorder, single episode, unspecified: Secondary | ICD-10-CM | POA: Diagnosis not present

## 2020-02-19 DIAGNOSIS — I4891 Unspecified atrial fibrillation: Secondary | ICD-10-CM | POA: Diagnosis not present

## 2020-02-19 DIAGNOSIS — E039 Hypothyroidism, unspecified: Secondary | ICD-10-CM | POA: Diagnosis not present

## 2020-02-19 DIAGNOSIS — I10 Essential (primary) hypertension: Secondary | ICD-10-CM | POA: Diagnosis not present

## 2020-02-22 ENCOUNTER — Other Ambulatory Visit: Payer: Self-pay

## 2020-02-22 ENCOUNTER — Ambulatory Visit (INDEPENDENT_AMBULATORY_CARE_PROVIDER_SITE_OTHER): Payer: Medicare Other | Admitting: Family Medicine

## 2020-02-22 ENCOUNTER — Encounter (INDEPENDENT_AMBULATORY_CARE_PROVIDER_SITE_OTHER): Payer: Self-pay | Admitting: Family Medicine

## 2020-02-22 VITALS — BP 128/81 | HR 53 | Temp 97.8°F | Ht 64.0 in | Wt 167.0 lb

## 2020-02-22 DIAGNOSIS — E669 Obesity, unspecified: Secondary | ICD-10-CM

## 2020-02-22 DIAGNOSIS — Z0184 Encounter for antibody response examination: Secondary | ICD-10-CM

## 2020-02-22 DIAGNOSIS — Z6830 Body mass index (BMI) 30.0-30.9, adult: Secondary | ICD-10-CM

## 2020-02-22 DIAGNOSIS — I10 Essential (primary) hypertension: Secondary | ICD-10-CM | POA: Diagnosis not present

## 2020-02-22 DIAGNOSIS — R7303 Prediabetes: Secondary | ICD-10-CM | POA: Diagnosis not present

## 2020-02-23 LAB — SARS-COV-2 SEMI-QUANTITATIVE TOTAL ANTIBODY, SPIKE
SARS-CoV-2 Semi-Quant Total Ab: 2326 U/mL (ref <0.8)
SARS-CoV-2 Spike Ab Interp: POSITIVE

## 2020-02-23 NOTE — Progress Notes (Signed)
Chief Complaint:   OBESITY Emylee is here to discuss her progress with her obesity treatment plan along with follow-up of her obesity related diagnoses. Patirica is on the Category 2 Plan and states she is following her eating plan approximately 95% of the time. Zaleigh states she is walking for 60 minutes 7 times per week.  Today's visit was #: 10 Starting weight: 213 lbs Starting date: 04/13/2019 Today's weight: 167 lbs Today's date: 02/22/2020 Total lbs lost to date: 46 lbs Total lbs lost since last in-office visit: 0 Total weight loss percentage to date: -21.60%  Interim History: Zelphia says she is getting in 1300-1400 calories and 64 grams of protein each day along with 138 grams of carbs and 2300 grams of sodium.  She wonders if she should be getting a Moderna booster.  Assessment/Plan:   1. Essential hypertension Kodee is working on healthy weight loss and exercise to improve blood pressure control. We will watch for signs of hypotension as she continues her lifestyle modifications.  She is off blood pressure medication.  BP Readings from Last 3 Encounters:  02/22/20 128/81  01/25/20 121/65  12/21/19 116/73   2. Immunity status testing Will test for COVID antibodies today.  - SARS-CoV-2 Semi-Quantitative Total Antibody, Spike  3. Prediabetes At goal. We discontinued Metformin at the last visit due to decreased appetite. Kesi will continue to work on weight loss, exercise, and decreasing simple carbohydrates to help decrease the risk of diabetes.   Lab Results  Component Value Date   HGBA1C 5.6 04/27/2019   Lab Results  Component Value Date   INSULIN 3.9 11/30/2019   INSULIN 5.2 08/12/2019   INSULIN 13.3 04/27/2019   4. Class 1 obesity with serious comorbidity and body mass index (BMI) of 30.0 to 30.9 in adult, unspecified obesity type Adam is currently in the action stage of change. As such, her goal is to maintain weight for now. She has agreed to  the Category 2 Plan.   Exercise goals: Add strength training.  Behavioral modification strategies: decreasing sodium intake.  Lynnex has agreed to follow-up with our clinic in 3 weeks. She was informed of the importance of frequent follow-up visits to maximize her success with intensive lifestyle modifications for her multiple health conditions.   Objective:   Blood pressure 128/81, pulse (!) 53, temperature 97.8 F (36.6 C), temperature source Oral, height 5\' 4"  (1.626 m), weight 167 lb (75.8 kg), SpO2 98 %. Body mass index is 28.67 kg/m.  General: Cooperative, alert, well developed, in no acute distress. HEENT: Conjunctivae and lids unremarkable. Cardiovascular: Regular rhythm.  Lungs: Normal work of breathing. Neurologic: No focal deficits.   Lab Results  Component Value Date   CREATININE 0.93 11/30/2019   BUN 39 (H) 11/30/2019   NA 141 11/30/2019   K 4.7 11/30/2019   CL 103 11/30/2019   CO2 22 11/30/2019   Lab Results  Component Value Date   ALT 16 11/30/2019   AST 43 (H) 11/30/2019   ALKPHOS 83 11/30/2019   BILITOT 0.4 11/30/2019   Lab Results  Component Value Date   HGBA1C 5.6 04/27/2019   Lab Results  Component Value Date   INSULIN 3.9 11/30/2019   INSULIN 5.2 08/12/2019   INSULIN 13.3 04/27/2019   Lab Results  Component Value Date   TSH 3.370 11/30/2019   Lab Results  Component Value Date   CHOL 163 11/30/2019   HDL 60 11/30/2019   LDLCALC 88 11/30/2019   TRIG 78  11/30/2019   CHOLHDL 2.6 08/12/2019   Lab Results  Component Value Date   WBC 8.9 11/30/2019   HGB 12.9 11/30/2019   HCT 39.1 11/30/2019   MCV 91 11/30/2019   PLT 347 11/30/2019   Obesity Behavioral Intervention:   Approximately 15 minutes were spent on the discussion below.  ASK: We discussed the diagnosis of obesity with [name] today and Auset agreed to give [place] permission to discuss obesity behavioral modification therapy today.  ASSESS: Nayleah has the diagnosis of  obesity and her BMI today is 28.7. Sayla is in the action stage of change.   ADVISE: Italia was educated on the multiple health risks of obesity as well as the benefit of weight loss to improve her health. She was advised of the need for long term treatment and the importance of lifestyle modifications to improve her current health and to decrease her risk of future health problems.  AGREE: Multiple dietary modification options and treatment options were discussed and Carlos agreed to follow the recommendations documented in the above note.  ARRANGE: Meisha was educated on the importance of frequent visits to treat obesity as outlined per CMS and USPSTF guidelines and agreed to schedule her next follow up appointment today.  Attestation Statements:   Reviewed by clinician on day of visit: allergies, medications, problem list, medical history, surgical history, family history, social history, and previous encounter notes.  I, Water quality scientist, CMA, am acting as transcriptionist for [name], DO  I have reviewed the above documentation for accuracy and completeness, and I agree with the above. [name], DO

## 2020-02-24 DIAGNOSIS — N3030 Trigonitis without hematuria: Secondary | ICD-10-CM | POA: Diagnosis not present

## 2020-02-29 ENCOUNTER — Other Ambulatory Visit: Payer: Self-pay | Admitting: Internal Medicine

## 2020-02-29 DIAGNOSIS — Z1231 Encounter for screening mammogram for malignant neoplasm of breast: Secondary | ICD-10-CM

## 2020-03-14 ENCOUNTER — Other Ambulatory Visit: Payer: Self-pay

## 2020-03-14 ENCOUNTER — Ambulatory Visit (INDEPENDENT_AMBULATORY_CARE_PROVIDER_SITE_OTHER): Payer: Medicare Other | Admitting: Family Medicine

## 2020-03-14 ENCOUNTER — Encounter (INDEPENDENT_AMBULATORY_CARE_PROVIDER_SITE_OTHER): Payer: Self-pay | Admitting: Family Medicine

## 2020-03-14 VITALS — BP 141/83 | HR 64 | Temp 97.7°F | Ht 64.0 in | Wt 165.0 lb

## 2020-03-14 DIAGNOSIS — R0602 Shortness of breath: Secondary | ICD-10-CM

## 2020-03-14 DIAGNOSIS — E669 Obesity, unspecified: Secondary | ICD-10-CM | POA: Diagnosis not present

## 2020-03-14 DIAGNOSIS — Z6830 Body mass index (BMI) 30.0-30.9, adult: Secondary | ICD-10-CM | POA: Diagnosis not present

## 2020-03-14 DIAGNOSIS — E038 Other specified hypothyroidism: Secondary | ICD-10-CM | POA: Diagnosis not present

## 2020-03-14 DIAGNOSIS — R630 Anorexia: Secondary | ICD-10-CM

## 2020-03-14 NOTE — Progress Notes (Addendum)
Chief Complaint:   OBESITY Linda Rice is here to discuss her progress with her obesity treatment plan along with follow-up of her obesity related diagnoses. Linda Rice is on the Category 2 Plan and states she is following her eating plan approximately 90% of the time. Linda Rice states she is walking and doing classes for 50 minutes 7 times per week.  Today's visit was #: 20 Starting weight: 213 lbs Starting date: 04/13/2019 Today's weight: 165 lbs Today's date: 03/14/2020 Total lbs lost to date: 48 lbs Total lbs lost since last in-office visit: 2 lbs Total weight loss percentage to date: -22.54%  Interim History: Linda Rice says she has decreased appetite.  She is averaging 1200-1500 calories and >60 grams of protein per day.  She had a new IC today.  RMR is down slightly. We discussed ways to increase calories through the addition of healthy fats or meal replacement shake daily.  Assessment/Plan:   1. Decreased appetite Will continue to monitor. No red flags today. If she continues to lose weight without intention, will check labs.  2. Other specified hypothyroidism Linda Rice is taking Synthroid 100 mcg daily.  RMR is 1648.  Lab Results  Component Value Date   TSH 3.370 11/30/2019   3. SOB (shortness of breath) on exertion Linda Rice had an IC done today. Lower than previous with weight loss. Adjusted goals.  4. Class 1 obesity with serious comorbidity and body mass index (BMI) of 30.0 to 30.9 in adult, unspecified obesity type  Linda Rice is currently in the action stage of change. As such, her goal is to continue with weight loss efforts. She has agreed to the Category 2 Plan.   Exercise goals: As is.  Behavioral modification strategies: increasing lean protein intake, decreasing simple carbohydrates, increasing vegetables and increasing water intake.  Linda Rice has agreed to follow-up with our clinic in 3 weeks. She was informed of the importance of frequent follow-up visits to maximize  her success with intensive lifestyle modifications for her multiple health conditions.   Objective:   Blood pressure (!) 141/83, pulse 64, temperature 97.7 F (36.5 C), temperature source Oral, height 5\' 4"  (1.626 m), weight 165 lb (74.8 kg), SpO2 100 %. Body mass index is 28.32 kg/m.  General: Cooperative, alert, well developed, in no acute distress. HEENT: Conjunctivae and lids unremarkable. Cardiovascular: Regular rhythm.  Lungs: Normal work of breathing. Neurologic: No focal deficits.   Lab Results  Component Value Date   CREATININE 0.93 11/30/2019   BUN 39 (H) 11/30/2019   NA 141 11/30/2019   K 4.7 11/30/2019   CL 103 11/30/2019   CO2 22 11/30/2019   Lab Results  Component Value Date   ALT 16 11/30/2019   AST 43 (H) 11/30/2019   ALKPHOS 83 11/30/2019   BILITOT 0.4 11/30/2019   Lab Results  Component Value Date   HGBA1C 5.6 04/27/2019   Lab Results  Component Value Date   INSULIN 3.9 11/30/2019   INSULIN 5.2 08/12/2019   INSULIN 13.3 04/27/2019   Lab Results  Component Value Date   TSH 3.370 11/30/2019   Lab Results  Component Value Date   CHOL 163 11/30/2019   HDL 60 11/30/2019   LDLCALC 88 11/30/2019   TRIG 78 11/30/2019   CHOLHDL 2.6 08/12/2019   Lab Results  Component Value Date   WBC 8.9 11/30/2019   HGB 12.9 11/30/2019   HCT 39.1 11/30/2019   MCV 91 11/30/2019   PLT 347 11/30/2019   Obesity Behavioral Intervention:  Approximately 15 minutes were spent on the discussion below.  ASK: We discussed the diagnosis of obesity with Linda Rice today and Linda Rice agreed to give [place] permission to discuss obesity behavioral modification therapy today.  ASSESS: Linda Rice has the diagnosis of obesity and her BMI today is 28.4. Linda Rice is in the action stage of change.   ADVISE: Linda Rice was educated on the multiple health risks of obesity as well as the benefit of weight loss to improve her health. She was advised of the need for long term treatment and  the importance of lifestyle modifications to improve her current health and to decrease her risk of future health problems.  AGREE: Multiple dietary modification options and treatment options were discussed and Linda Rice agreed to follow the recommendations documented in the above note.  ARRANGE: Linda Rice was educated on the importance of frequent visits to treat obesity as outlined per CMS and USPSTF guidelines and agreed to schedule her next follow up appointment today.  Attestation Statements:   Reviewed by clinician on day of visit: allergies, medications, problem list, medical history, surgical history, family history, social history, and previous encounter notes.  I, Water quality scientist, CMA, am acting as transcriptionist for [name], DO  I have reviewed the above documentation for accuracy and completeness, and I agree with the above. [name], DO

## 2020-03-16 ENCOUNTER — Ambulatory Visit
Admission: RE | Admit: 2020-03-16 | Discharge: 2020-03-16 | Disposition: A | Discharge status: Home / Self Care | Payer: Medicare Other | Source: Ambulatory Visit | Attending: Internal Medicine | Admitting: Internal Medicine

## 2020-03-16 ENCOUNTER — Other Ambulatory Visit: Payer: Self-pay

## 2020-03-16 DIAGNOSIS — Z1231 Encounter for screening mammogram for malignant neoplasm of breast: Secondary | ICD-10-CM | POA: Diagnosis not present

## 2020-03-23 DIAGNOSIS — N3030 Trigonitis without hematuria: Secondary | ICD-10-CM | POA: Diagnosis not present

## 2020-03-23 DIAGNOSIS — Z79899 Other long term (current) drug therapy: Secondary | ICD-10-CM | POA: Diagnosis not present

## 2020-04-06 DIAGNOSIS — Z23 Encounter for immunization: Secondary | ICD-10-CM | POA: Diagnosis not present

## 2020-04-11 ENCOUNTER — Ambulatory Visit (INDEPENDENT_AMBULATORY_CARE_PROVIDER_SITE_OTHER): Payer: Medicare Other | Admitting: Family Medicine

## 2020-04-11 ENCOUNTER — Encounter (INDEPENDENT_AMBULATORY_CARE_PROVIDER_SITE_OTHER): Payer: Self-pay | Admitting: Family Medicine

## 2020-04-11 ENCOUNTER — Other Ambulatory Visit: Payer: Self-pay

## 2020-04-11 VITALS — BP 126/63 | HR 62 | Temp 97.8°F | Ht 64.0 in | Wt 160.0 lb

## 2020-04-11 DIAGNOSIS — E669 Obesity, unspecified: Secondary | ICD-10-CM

## 2020-04-11 DIAGNOSIS — Z6830 Body mass index (BMI) 30.0-30.9, adult: Secondary | ICD-10-CM | POA: Diagnosis not present

## 2020-04-11 DIAGNOSIS — R1013 Epigastric pain: Secondary | ICD-10-CM | POA: Diagnosis not present

## 2020-04-11 DIAGNOSIS — F5089 Other specified eating disorder: Secondary | ICD-10-CM

## 2020-04-11 MED ORDER — FAMOTIDINE 20 MG PO TABS: 20.0000 mg | ORAL_TABLET | Freq: Two times a day (BID) | ORAL | 0 refills | Status: DC

## 2020-04-12 NOTE — Progress Notes (Signed)
Chief Complaint:   OBESITY Linda Rice is here to discuss her progress with her obesity treatment plan along with follow-up of her obesity related diagnoses.   Today's visit was #: 24 Starting weight: 213 lbs Starting date: 04/13/2019 Today's weight: 160 lbs Today's date: 04/11/2020 Total lbs lost to date: 53 lbs Body mass index is 27.46 kg/m.  Total weight loss percentage to date: -24.88%  Interim History: [name] has questionable gastritis.  She stopped taking Naproxen last week and is improving.  She says she is taking doxycycline with food.  She is taking Pepcid daily.  We discussed orthorexia.    Nutrition Plan: the Category 2 Plan 92% of the time.  Anti-obesity medications: None. Hunger is well controlled controlled. Cravings are well controlled controlled.  Activity: Increased walking.  Assessment/Plan:   1. Dyspepsia Sylvester has questionable gastritis.  She stopped taking Naproxen last week and is improving.  She says she is taking doxycycline with food.  I recommend taking Pepcid daily - BID. We will continue to monitor symptoms as they relate to her weight loss journey.  - Start famotidine (PEPCID) 20 MG tablet; Take 1 tablet (20 mg total) by mouth 2 (two) times daily.  Dispense: 40 tablet; Refill: 0  2. Other disorder of eating, orthorexia [name] and I discussed orthorexia - an obsession with 'healthful' eating. This can lead to restriction and malnourishment. Orthorexia is treated similarly to anorexia and OCD.   3. Class 1 obesity with serious comorbidity and body mass index (BMI) of 30.0 to 30.9 in adult, unspecified obesity type  [name] is currently in the action stage of change. As such, her goal is to continue with weight loss efforts.   Nutrition goals: She has agreed to the Category 2 Plan.   Stop journaling.  Exercise goals: For substantial health benefits, adults should do at least 150 minutes (2 hours and 30 minutes) a week of moderate-intensity, or  75 minutes (1 hour and 15 minutes) a week of vigorous-intensity aerobic physical activity, or an equivalent combination of moderate- and vigorous-intensity aerobic activity. Aerobic activity should be performed in episodes of at least 10 minutes, and preferably, it should be spread throughout the week.  Behavioral modification strategies: increasing water intake and planning for success.  [name] has agreed to follow-up with our clinic in 4 weeks. She was informed of the importance of frequent follow-up visits to maximize her success with intensive lifestyle modifications for her multiple health conditions.   Objective:   Blood pressure 126/63, pulse 62, temperature 97.8 F (36.6 C), temperature source Oral, height 5\' 4"  (1.626 m), weight 160 lb (72.6 kg), SpO2 99 %. Body mass index is 27.46 kg/m.  General: Cooperative, alert, well developed, in no acute distress. HEENT: Conjunctivae and lids unremarkable. Cardiovascular: Regular rhythm.  Lungs: Normal work of breathing. Neurologic: No focal deficits.   Lab Results  Component Value Date   CREATININE 0.93 11/30/2019   BUN 39 (H) 11/30/2019   NA 141 11/30/2019   K 4.7 11/30/2019   CL 103 11/30/2019   CO2 22 11/30/2019   Lab Results  Component Value Date   ALT 16 11/30/2019   AST 43 (H) 11/30/2019   ALKPHOS 83 11/30/2019   BILITOT 0.4 11/30/2019   Lab Results  Component Value Date   HGBA1C 5.6 04/27/2019   Lab Results  Component Value Date   INSULIN 3.9 11/30/2019   INSULIN 5.2 08/12/2019   INSULIN 13.3 04/27/2019   Lab Results  Component Value Date  TSH 3.370 11/30/2019   Lab Results  Component Value Date   CHOL 163 11/30/2019   HDL 60 11/30/2019   LDLCALC 88 11/30/2019   TRIG 78 11/30/2019   CHOLHDL 2.6 08/12/2019   Lab Results  Component Value Date   WBC 8.9 11/30/2019   HGB 12.9 11/30/2019   HCT 39.1 11/30/2019   MCV 91 11/30/2019   PLT 347 11/30/2019   Obesity Behavioral Intervention:    Approximately 15 minutes were spent on the discussion below.  ASK: We discussed the diagnosis of obesity with [name] today and [name] agreed to give [place] permission to discuss obesity behavioral modification therapy today.  ASSESS: [name] has the diagnosis of obesity and her BMI today is 27.6. [name] is in the action stage of change.   ADVISE: [name] was educated on the multiple health risks of obesity as well as the benefit of weight loss to improve her health. She was advised of the need for long term treatment and the importance of lifestyle modifications to improve her current health and to decrease her risk of future health problems.  AGREE: Multiple dietary modification options and treatment options were discussed and [name] agreed to follow the recommendations documented in the above note.  ARRANGE: [name] was educated on the importance of frequent visits to treat obesity as outlined per CMS and USPSTF guidelines and agreed to schedule her next follow up appointment today.  Attestation Statements:   Reviewed by clinician on day of visit: allergies, medications, problem list, medical history, surgical history, family history, social history, and previous encounter notes.  I, Water quality scientist, CMA, am acting as transcriptionist for [name], DO  I have reviewed the above documentation for accuracy and completeness, and I agree with the above. [name], DO

## 2020-04-20 DIAGNOSIS — N3030 Trigonitis without hematuria: Secondary | ICD-10-CM | POA: Diagnosis not present

## 2020-04-28 DIAGNOSIS — N3030 Trigonitis without hematuria: Secondary | ICD-10-CM | POA: Diagnosis not present

## 2020-04-28 DIAGNOSIS — N390 Urinary tract infection, site not specified: Secondary | ICD-10-CM | POA: Diagnosis not present

## 2020-04-29 DIAGNOSIS — N136 Pyonephrosis: Secondary | ICD-10-CM | POA: Diagnosis not present

## 2020-04-29 DIAGNOSIS — N1339 Other hydronephrosis: Secondary | ICD-10-CM | POA: Diagnosis not present

## 2020-04-29 DIAGNOSIS — I10 Essential (primary) hypertension: Secondary | ICD-10-CM | POA: Diagnosis present

## 2020-04-29 DIAGNOSIS — N12 Tubulo-interstitial nephritis, not specified as acute or chronic: Secondary | ICD-10-CM | POA: Diagnosis not present

## 2020-04-29 DIAGNOSIS — R109 Unspecified abdominal pain: Secondary | ICD-10-CM | POA: Diagnosis not present

## 2020-04-29 DIAGNOSIS — Z20822 Contact with and (suspected) exposure to covid-19: Secondary | ICD-10-CM | POA: Diagnosis present

## 2020-04-29 DIAGNOSIS — E7800 Pure hypercholesterolemia, unspecified: Secondary | ICD-10-CM | POA: Diagnosis present

## 2020-04-29 DIAGNOSIS — N1330 Unspecified hydronephrosis: Secondary | ICD-10-CM | POA: Diagnosis not present

## 2020-04-29 DIAGNOSIS — E039 Hypothyroidism, unspecified: Secondary | ICD-10-CM | POA: Diagnosis not present

## 2020-04-29 DIAGNOSIS — N189 Chronic kidney disease, unspecified: Secondary | ICD-10-CM | POA: Diagnosis not present

## 2020-04-29 DIAGNOSIS — R319 Hematuria, unspecified: Secondary | ICD-10-CM | POA: Diagnosis not present

## 2020-04-29 DIAGNOSIS — D72829 Elevated white blood cell count, unspecified: Secondary | ICD-10-CM | POA: Diagnosis not present

## 2020-04-29 DIAGNOSIS — D649 Anemia, unspecified: Secondary | ICD-10-CM | POA: Diagnosis not present

## 2020-04-29 DIAGNOSIS — J45909 Unspecified asthma, uncomplicated: Secondary | ICD-10-CM | POA: Diagnosis present

## 2020-04-29 DIAGNOSIS — R1110 Vomiting, unspecified: Secondary | ICD-10-CM | POA: Diagnosis not present

## 2020-04-29 DIAGNOSIS — Z79890 Hormone replacement therapy (postmenopausal): Secondary | ICD-10-CM | POA: Diagnosis not present

## 2020-04-29 DIAGNOSIS — N179 Acute kidney failure, unspecified: Secondary | ICD-10-CM | POA: Diagnosis present

## 2020-04-29 DIAGNOSIS — L659 Nonscarring hair loss, unspecified: Secondary | ICD-10-CM | POA: Diagnosis present

## 2020-04-29 DIAGNOSIS — E785 Hyperlipidemia, unspecified: Secondary | ICD-10-CM | POA: Diagnosis present

## 2020-04-29 DIAGNOSIS — N10 Acute tubulo-interstitial nephritis: Secondary | ICD-10-CM | POA: Diagnosis not present

## 2020-04-29 DIAGNOSIS — Z79899 Other long term (current) drug therapy: Secondary | ICD-10-CM | POA: Diagnosis not present

## 2020-04-29 DIAGNOSIS — A4189 Other specified sepsis: Secondary | ICD-10-CM | POA: Diagnosis not present

## 2020-04-29 DIAGNOSIS — N390 Urinary tract infection, site not specified: Secondary | ICD-10-CM | POA: Diagnosis not present

## 2020-04-29 DIAGNOSIS — K5900 Constipation, unspecified: Secondary | ICD-10-CM | POA: Diagnosis present

## 2020-04-29 DIAGNOSIS — K219 Gastro-esophageal reflux disease without esophagitis: Secondary | ICD-10-CM | POA: Diagnosis present

## 2020-04-29 DIAGNOSIS — Z96659 Presence of unspecified artificial knee joint: Secondary | ICD-10-CM | POA: Diagnosis present

## 2020-04-29 DIAGNOSIS — N3010 Interstitial cystitis (chronic) without hematuria: Secondary | ICD-10-CM | POA: Diagnosis not present

## 2020-04-29 DIAGNOSIS — Z881 Allergy status to other antibiotic agents status: Secondary | ICD-10-CM | POA: Diagnosis not present

## 2020-04-29 DIAGNOSIS — B9689 Other specified bacterial agents as the cause of diseases classified elsewhere: Secondary | ICD-10-CM | POA: Diagnosis present

## 2020-04-29 DIAGNOSIS — A419 Sepsis, unspecified organism: Secondary | ICD-10-CM | POA: Diagnosis not present

## 2020-04-29 DIAGNOSIS — Z87891 Personal history of nicotine dependence: Secondary | ICD-10-CM | POA: Diagnosis not present

## 2020-05-02 DIAGNOSIS — D649 Anemia, unspecified: Secondary | ICD-10-CM | POA: Diagnosis not present

## 2020-05-02 DIAGNOSIS — K5900 Constipation, unspecified: Secondary | ICD-10-CM | POA: Diagnosis not present

## 2020-05-02 DIAGNOSIS — N179 Acute kidney failure, unspecified: Secondary | ICD-10-CM | POA: Diagnosis not present

## 2020-05-02 DIAGNOSIS — J45909 Unspecified asthma, uncomplicated: Secondary | ICD-10-CM | POA: Diagnosis not present

## 2020-05-02 DIAGNOSIS — N189 Chronic kidney disease, unspecified: Secondary | ICD-10-CM | POA: Diagnosis not present

## 2020-05-02 DIAGNOSIS — K219 Gastro-esophageal reflux disease without esophagitis: Secondary | ICD-10-CM | POA: Diagnosis not present

## 2020-05-02 DIAGNOSIS — R531 Weakness: Secondary | ICD-10-CM | POA: Diagnosis not present

## 2020-05-02 DIAGNOSIS — E785 Hyperlipidemia, unspecified: Secondary | ICD-10-CM | POA: Diagnosis not present

## 2020-05-02 DIAGNOSIS — N10 Acute tubulo-interstitial nephritis: Secondary | ICD-10-CM | POA: Diagnosis not present

## 2020-05-02 DIAGNOSIS — N3010 Interstitial cystitis (chronic) without hematuria: Secondary | ICD-10-CM | POA: Diagnosis not present

## 2020-05-02 DIAGNOSIS — E039 Hypothyroidism, unspecified: Secondary | ICD-10-CM | POA: Diagnosis not present

## 2020-05-02 DIAGNOSIS — N390 Urinary tract infection, site not specified: Secondary | ICD-10-CM | POA: Diagnosis not present

## 2020-05-02 DIAGNOSIS — I10 Essential (primary) hypertension: Secondary | ICD-10-CM | POA: Diagnosis not present

## 2020-05-02 DIAGNOSIS — N12 Tubulo-interstitial nephritis, not specified as acute or chronic: Secondary | ICD-10-CM | POA: Diagnosis not present

## 2020-05-02 DIAGNOSIS — R319 Hematuria, unspecified: Secondary | ICD-10-CM | POA: Diagnosis not present

## 2020-05-02 DIAGNOSIS — L659 Nonscarring hair loss, unspecified: Secondary | ICD-10-CM | POA: Diagnosis not present

## 2020-05-04 ENCOUNTER — Telehealth (INDEPENDENT_AMBULATORY_CARE_PROVIDER_SITE_OTHER): Payer: Self-pay

## 2020-05-04 DIAGNOSIS — N179 Acute kidney failure, unspecified: Secondary | ICD-10-CM | POA: Diagnosis not present

## 2020-05-04 DIAGNOSIS — N12 Tubulo-interstitial nephritis, not specified as acute or chronic: Secondary | ICD-10-CM | POA: Diagnosis not present

## 2020-05-04 DIAGNOSIS — R531 Weakness: Secondary | ICD-10-CM | POA: Diagnosis not present

## 2020-05-04 DIAGNOSIS — E039 Hypothyroidism, unspecified: Secondary | ICD-10-CM | POA: Diagnosis not present

## 2020-05-04 DIAGNOSIS — I10 Essential (primary) hypertension: Secondary | ICD-10-CM | POA: Diagnosis not present

## 2020-05-04 NOTE — Telephone Encounter (Signed)
Pt called requesting a c/b frm Dr.Wallace about her being in a rest home, and that her son is with her, and she'd like to inform Dr.Wallace in on her current status, please give pt a c/b.

## 2020-05-09 ENCOUNTER — Encounter (INDEPENDENT_AMBULATORY_CARE_PROVIDER_SITE_OTHER): Payer: Self-pay | Admitting: Family Medicine

## 2020-05-09 ENCOUNTER — Ambulatory Visit (INDEPENDENT_AMBULATORY_CARE_PROVIDER_SITE_OTHER): Payer: Medicare Other | Admitting: Family Medicine

## 2020-05-09 NOTE — Telephone Encounter (Signed)
Please review

## 2020-05-09 NOTE — Telephone Encounter (Signed)
See my chart message

## 2020-05-16 DIAGNOSIS — N3030 Trigonitis without hematuria: Secondary | ICD-10-CM | POA: Diagnosis not present

## 2020-05-16 DIAGNOSIS — B999 Unspecified infectious disease: Secondary | ICD-10-CM | POA: Diagnosis not present

## 2020-05-16 DIAGNOSIS — Z23 Encounter for immunization: Secondary | ICD-10-CM | POA: Diagnosis not present

## 2020-05-17 DIAGNOSIS — M6281 Muscle weakness (generalized): Secondary | ICD-10-CM | POA: Diagnosis not present

## 2020-05-17 DIAGNOSIS — R2689 Other abnormalities of gait and mobility: Secondary | ICD-10-CM | POA: Diagnosis not present

## 2020-05-17 DIAGNOSIS — N10 Acute tubulo-interstitial nephritis: Secondary | ICD-10-CM | POA: Diagnosis not present

## 2020-05-18 DIAGNOSIS — N3030 Trigonitis without hematuria: Secondary | ICD-10-CM | POA: Diagnosis not present

## 2020-05-19 DIAGNOSIS — K5901 Slow transit constipation: Secondary | ICD-10-CM | POA: Diagnosis not present

## 2020-05-19 DIAGNOSIS — N10 Acute tubulo-interstitial nephritis: Secondary | ICD-10-CM | POA: Diagnosis not present

## 2020-05-19 DIAGNOSIS — K224 Dyskinesia of esophagus: Secondary | ICD-10-CM | POA: Diagnosis not present

## 2020-05-19 DIAGNOSIS — M6281 Muscle weakness (generalized): Secondary | ICD-10-CM | POA: Diagnosis not present

## 2020-05-19 DIAGNOSIS — R2689 Other abnormalities of gait and mobility: Secondary | ICD-10-CM | POA: Diagnosis not present

## 2020-05-20 ENCOUNTER — Ambulatory Visit: Payer: Medicare Other | Admitting: Family Medicine

## 2020-05-23 DIAGNOSIS — M6281 Muscle weakness (generalized): Secondary | ICD-10-CM | POA: Diagnosis not present

## 2020-05-23 DIAGNOSIS — R2689 Other abnormalities of gait and mobility: Secondary | ICD-10-CM | POA: Diagnosis not present

## 2020-05-23 DIAGNOSIS — N10 Acute tubulo-interstitial nephritis: Secondary | ICD-10-CM | POA: Diagnosis not present

## 2020-05-25 DIAGNOSIS — R2689 Other abnormalities of gait and mobility: Secondary | ICD-10-CM | POA: Diagnosis not present

## 2020-05-25 DIAGNOSIS — M6281 Muscle weakness (generalized): Secondary | ICD-10-CM | POA: Diagnosis not present

## 2020-05-25 DIAGNOSIS — N10 Acute tubulo-interstitial nephritis: Secondary | ICD-10-CM | POA: Diagnosis not present

## 2020-05-26 DIAGNOSIS — Z9049 Acquired absence of other specified parts of digestive tract: Secondary | ICD-10-CM | POA: Diagnosis not present

## 2020-05-26 DIAGNOSIS — N1339 Other hydronephrosis: Secondary | ICD-10-CM | POA: Diagnosis not present

## 2020-05-26 DIAGNOSIS — E039 Hypothyroidism, unspecified: Secondary | ICD-10-CM | POA: Diagnosis not present

## 2020-05-26 DIAGNOSIS — R32 Unspecified urinary incontinence: Secondary | ICD-10-CM | POA: Diagnosis not present

## 2020-05-26 DIAGNOSIS — Z79890 Hormone replacement therapy (postmenopausal): Secondary | ICD-10-CM | POA: Diagnosis not present

## 2020-05-26 DIAGNOSIS — E785 Hyperlipidemia, unspecified: Secondary | ICD-10-CM | POA: Diagnosis not present

## 2020-05-26 DIAGNOSIS — N3030 Trigonitis without hematuria: Secondary | ICD-10-CM | POA: Diagnosis not present

## 2020-05-26 DIAGNOSIS — J45909 Unspecified asthma, uncomplicated: Secondary | ICD-10-CM | POA: Diagnosis not present

## 2020-05-26 DIAGNOSIS — Z87891 Personal history of nicotine dependence: Secondary | ICD-10-CM | POA: Diagnosis not present

## 2020-05-26 DIAGNOSIS — I10 Essential (primary) hypertension: Secondary | ICD-10-CM | POA: Diagnosis not present

## 2020-05-26 DIAGNOSIS — Z90721 Acquired absence of ovaries, unilateral: Secondary | ICD-10-CM | POA: Diagnosis not present

## 2020-05-26 DIAGNOSIS — Z79899 Other long term (current) drug therapy: Secondary | ICD-10-CM | POA: Diagnosis not present

## 2020-05-27 ENCOUNTER — Other Ambulatory Visit: Payer: Self-pay | Admitting: Internal Medicine

## 2020-05-27 DIAGNOSIS — N12 Tubulo-interstitial nephritis, not specified as acute or chronic: Secondary | ICD-10-CM | POA: Diagnosis not present

## 2020-05-27 DIAGNOSIS — E039 Hypothyroidism, unspecified: Secondary | ICD-10-CM | POA: Diagnosis not present

## 2020-05-27 DIAGNOSIS — N3030 Trigonitis without hematuria: Secondary | ICD-10-CM | POA: Diagnosis not present

## 2020-05-27 DIAGNOSIS — N1330 Unspecified hydronephrosis: Secondary | ICD-10-CM

## 2020-05-27 DIAGNOSIS — D649 Anemia, unspecified: Secondary | ICD-10-CM | POA: Diagnosis not present

## 2020-05-27 DIAGNOSIS — N179 Acute kidney failure, unspecified: Secondary | ICD-10-CM | POA: Diagnosis not present

## 2020-05-27 DIAGNOSIS — I480 Paroxysmal atrial fibrillation: Secondary | ICD-10-CM | POA: Diagnosis not present

## 2020-05-27 DIAGNOSIS — R000 Tachycardia, unspecified: Secondary | ICD-10-CM | POA: Diagnosis not present

## 2020-05-30 ENCOUNTER — Ambulatory Visit
Admission: RE | Admit: 2020-05-30 | Discharge: 2020-05-30 | Disposition: A | Discharge status: Home / Self Care | Payer: Medicare Other | Source: Ambulatory Visit | Attending: Internal Medicine | Admitting: Internal Medicine

## 2020-05-30 DIAGNOSIS — M6281 Muscle weakness (generalized): Secondary | ICD-10-CM | POA: Diagnosis not present

## 2020-05-30 DIAGNOSIS — N281 Cyst of kidney, acquired: Secondary | ICD-10-CM | POA: Diagnosis not present

## 2020-05-30 DIAGNOSIS — N1330 Unspecified hydronephrosis: Secondary | ICD-10-CM | POA: Diagnosis not present

## 2020-05-30 DIAGNOSIS — R2689 Other abnormalities of gait and mobility: Secondary | ICD-10-CM | POA: Diagnosis not present

## 2020-05-30 DIAGNOSIS — N10 Acute tubulo-interstitial nephritis: Secondary | ICD-10-CM | POA: Diagnosis not present

## 2020-05-30 DIAGNOSIS — R319 Hematuria, unspecified: Secondary | ICD-10-CM | POA: Diagnosis not present

## 2020-05-31 DIAGNOSIS — N3030 Trigonitis without hematuria: Secondary | ICD-10-CM | POA: Diagnosis not present

## 2020-06-01 DIAGNOSIS — M6281 Muscle weakness (generalized): Secondary | ICD-10-CM | POA: Diagnosis not present

## 2020-06-01 DIAGNOSIS — N3030 Trigonitis without hematuria: Secondary | ICD-10-CM | POA: Diagnosis not present

## 2020-06-01 DIAGNOSIS — R2689 Other abnormalities of gait and mobility: Secondary | ICD-10-CM | POA: Diagnosis not present

## 2020-06-01 DIAGNOSIS — N10 Acute tubulo-interstitial nephritis: Secondary | ICD-10-CM | POA: Diagnosis not present

## 2020-06-07 DIAGNOSIS — M6281 Muscle weakness (generalized): Secondary | ICD-10-CM | POA: Diagnosis not present

## 2020-06-07 DIAGNOSIS — R2689 Other abnormalities of gait and mobility: Secondary | ICD-10-CM | POA: Diagnosis not present

## 2020-06-07 DIAGNOSIS — N10 Acute tubulo-interstitial nephritis: Secondary | ICD-10-CM | POA: Diagnosis not present

## 2020-06-08 DIAGNOSIS — N3030 Trigonitis without hematuria: Secondary | ICD-10-CM | POA: Diagnosis not present

## 2020-06-10 DIAGNOSIS — M6281 Muscle weakness (generalized): Secondary | ICD-10-CM | POA: Diagnosis not present

## 2020-06-10 DIAGNOSIS — N10 Acute tubulo-interstitial nephritis: Secondary | ICD-10-CM | POA: Diagnosis not present

## 2020-06-10 DIAGNOSIS — R2689 Other abnormalities of gait and mobility: Secondary | ICD-10-CM | POA: Diagnosis not present

## 2020-06-13 ENCOUNTER — Other Ambulatory Visit: Payer: Self-pay

## 2020-06-13 ENCOUNTER — Encounter: Payer: Self-pay | Admitting: Cardiology

## 2020-06-13 ENCOUNTER — Ambulatory Visit: Payer: Medicare Other | Admitting: Cardiology

## 2020-06-13 VITALS — BP 137/76 | HR 72 | Resp 16 | Ht 64.0 in | Wt 160.2 lb

## 2020-06-13 DIAGNOSIS — I498 Other specified cardiac arrhythmias: Secondary | ICD-10-CM | POA: Diagnosis not present

## 2020-06-13 DIAGNOSIS — R9431 Abnormal electrocardiogram [ECG] [EKG]: Secondary | ICD-10-CM | POA: Diagnosis not present

## 2020-06-13 DIAGNOSIS — I4719 Other supraventricular tachycardia: Secondary | ICD-10-CM | POA: Insufficient documentation

## 2020-06-13 DIAGNOSIS — I499 Cardiac arrhythmia, unspecified: Secondary | ICD-10-CM

## 2020-06-13 DIAGNOSIS — I491 Atrial premature depolarization: Secondary | ICD-10-CM | POA: Insufficient documentation

## 2020-06-13 NOTE — Progress Notes (Signed)
  Patient referred by Griffin, John, MD for irregular heart rhythm  Subjective:   Linda Rice, female    DOB: 03/23/1941, 80 y.o.   MRN: [id number]   Chief Complaint  Patient presents with  . Atrial Fibrillation     HPI  80 y.o. Caucasian female with hypertension, hyperlipidemia, asthma, hypothyroidism, referred for evaluation of irregular heart rhythm.  Patient was last seen by me in 06/2019 prior to her colonoscopy. At that time, careful evaluation of telemetry and EKG showed her irregular rhythm to be sinus arrhythmia and not atrial fibrillation. Patient underwent successful colonoscopy without any cardiac events.   Patient is here today given recurrent consult for atrial fibrillation.  Patient was visiting her daughter in [place] in November 2021.  While in [place], she was diagnosed with UTI with sepsis and was started on Levaquin.  She then spent some time at rehab here in [place].  After recovery from her sepsis, she went to see her PCP Dr. John Griffin who performed an EKG.  EKG reportedly showed atrial fibrillation, tracing not available for my review.  She was started on Eliquis.  However, she had profound hematuria with just 1 dose of Eliquis.  Of note, patient has diagnosis of follicular cystitis for which she has been on treatment with steroids and is hoping to undergo bladder surgery at [facility] in March 2022.  Given her hematuria, her Eliquis was stopped and she was recommended to have follow-up with me.  Patient denies chest pain, shortness of breath, palpitations, leg edema, orthopnea, PND, TIA/syncope.  Of note, she does have longstanding history of asthma.   Current Outpatient Medications on File Prior to Visit  Medication Sig Dispense Refill  . acetaminophen (TYLENOL) 500 MG tablet Take 500 mg by mouth every 6 (six) hours as needed.    . albuterol (VENTOLIN HFA) 108 (90 Base) MCG/ACT inhaler Inhale 1-2 puffs into the lungs every 6 (six) hours as needed  for wheezing or shortness of breath. 18 g 1  . atorvastatin (LIPITOR) 10 MG tablet Take 10 mg by mouth daily.    . CALCIUM CITRATE PO Take 500 mg by mouth 2 (two) times daily.     . Cholecalciferol (VITAMIN D3) 125 MCG (5000 UT) CAPS Take 1 capsule by mouth every 7 (seven) days.    . doxycycline (VIBRAMYCIN) 100 MG capsule Take 100 mg by mouth 2 (two) times daily.  4  . EPINEPHrine 0.3 mg/0.3 mL IJ SOAJ injection Inject 0.3 mLs (0.3 mg total) into the muscle as needed for anaphylaxis. 2 each 1  . estradiol (ESTRACE) 0.1 MG/GM vaginal cream Place 1 Applicatorful vaginally 3 (three) times a week. 42.5 g 2  . famotidine (PEPCID) 20 MG tablet Take 1 tablet (20 mg total) by mouth 2 (two) times daily. 40 tablet 0  . fluticasone (FLONASE) 50 MCG/ACT nasal spray Place 2 sprays into both nostrils daily as needed (stuffy nose). 16 g 5  . levothyroxine (SYNTHROID) 100 MCG tablet Take 100 mcg by mouth daily before breakfast.     . montelukast (SINGULAIR) 10 MG tablet TAKE 1 TABLET BY MOUTH ONCE DAILY TO PREVENT COUGHING OR WHEEZING 30 tablet 5  . Multiple Vitamins-Minerals (PRESERVISION AREDS 2 PO) Take 1 tablet by mouth in the morning and at bedtime.     . naproxen sodium (ALEVE) 220 MG tablet Take 220 mg by mouth 2 (two) times daily with a meal.     . nitrofurantoin (MACRODANTIN) 100 MG capsule Take   100 mg by mouth every evening.      No current facility-administered medications on file prior to visit.    Cardiovascular studies:  EKG 06/13/2020: Sinus tachycardia 101 bpm with multifom PAC's Cannot exclude old inferior infarct  EKG 06/25/2019: Sinus rhythm 59 bpm. Occasional PAC's Cannot exclude old anteroseptal infarct.  EKG 03/23/2019:  Sinus rhythm 86 bpm with sinus arrhtymia. Occasional PAC.    Poor R-wave progression. Nonspecific ST depression.  Echocardiogram 06/23/2018: - Left ventricle: The cavity size was normal. Wall thickness was   normal. Wall motion was normal; there were no  regional wall   motion abnormalities. - Right atrium: The atrium was mildly dilated. - Pulmonary arteries: Systolic pressure was mildly increased. PA   peak pressure: 34 mm Hg (S).   Recent labs: 03/23/2019: Glucose 128. BUN/Cr 15/0.78. eGFR 72. Na/K 143/3.9. H/H 14/42. MCV 90. Platelets 306.  01/19/2019: Glucose 85. BUN/Cr 20/0.78. eGFR 72. Na/K 142/4.1. Rest of the CMP normal Chol 186, TG 132, HDL 59, LDL 101.  TSH 0.97 normal Occult fecal blood positive   Review of Systems  Cardiovascular: Negative for chest pain, dyspnea on exertion, leg swelling, palpitations and syncope.         Vitals:   06/13/20 1140  BP: 137/76  Pulse: 72  Resp: 16  SpO2: 100%     Body mass index is 27.5 kg/m. Filed Weights   06/13/20 1140  Weight: 160 lb 3.2 oz (72.7 kg)     Objective:   Physical Exam Vitals and nursing note reviewed.  Constitutional:      General: She is not in acute distress.    Appearance: She is well-developed.  Neck:     Vascular: No JVD.  Cardiovascular:     Rate and Rhythm: Normal rate and regular rhythm. Frequent extrasystoles are present.    Pulses: Intact distal pulses.     Heart sounds: Normal heart sounds. No murmur heard.   Pulmonary:     Effort: Pulmonary effort is normal.     Breath sounds: Normal breath sounds. No wheezing or rales.           Assessment & Recommendations:   80 y.o. Caucasian female with hypertension, hyperlipidemia, asthma, hypothyroidism, referred for evaluation of irregular heart rhythm  Irregular heart rhythm: Reviewed EKG today and compared with EKG in January 2021.  Both EKGs show sinus tachycardia/arrhythmia with frequent multiform PACs.  I do not have EKG performed at Dr. Laurann Montana recently.  I suspect her rhythm abnormalities are stemming from her longstanding history of asthma.  Given her hematuria in the setting of follicular cystitis, I will have high threshold to recommend anticoagulation at this time, even if  she were to have atrial fibrillation.  I will obtain echocardiogram and 2-week monitor.  Given absence of any symptoms, I do not think she needs any AV nodal blocking agents at this time.  I will also obtain EKG from Dr. Delene Ruffini office.  In the long run, should she have indication for stroke prevention for atrial fibrillation, could consider referral for left atrial appendage closure given her high risk of bleeding.  Again, I do not think this is necessary at this time.  I will see for follow-up after echocardiogram and monitor.   Nigel Mormon, MD [facility] [place] Pager: [phone] Office: [phone] If no answer Cell [phone]

## 2020-06-14 DIAGNOSIS — R2689 Other abnormalities of gait and mobility: Secondary | ICD-10-CM | POA: Diagnosis not present

## 2020-06-14 DIAGNOSIS — N10 Acute tubulo-interstitial nephritis: Secondary | ICD-10-CM | POA: Diagnosis not present

## 2020-06-14 DIAGNOSIS — M6281 Muscle weakness (generalized): Secondary | ICD-10-CM | POA: Diagnosis not present

## 2020-06-15 ENCOUNTER — Ambulatory Visit: Payer: Medicare Other

## 2020-06-15 ENCOUNTER — Inpatient Hospital Stay: Payer: Medicare Other

## 2020-06-15 ENCOUNTER — Other Ambulatory Visit: Payer: Self-pay

## 2020-06-15 DIAGNOSIS — I499 Cardiac arrhythmia, unspecified: Secondary | ICD-10-CM | POA: Diagnosis not present

## 2020-06-15 DIAGNOSIS — I491 Atrial premature depolarization: Secondary | ICD-10-CM

## 2020-06-15 DIAGNOSIS — R9431 Abnormal electrocardiogram [ECG] [EKG]: Secondary | ICD-10-CM | POA: Diagnosis not present

## 2020-06-16 ENCOUNTER — Encounter (INDEPENDENT_AMBULATORY_CARE_PROVIDER_SITE_OTHER): Payer: Self-pay | Admitting: Family Medicine

## 2020-06-16 ENCOUNTER — Ambulatory Visit: Payer: Medicare Other | Admitting: Student

## 2020-06-16 ENCOUNTER — Ambulatory Visit (INDEPENDENT_AMBULATORY_CARE_PROVIDER_SITE_OTHER): Payer: Medicare Other | Admitting: Family Medicine

## 2020-06-16 ENCOUNTER — Other Ambulatory Visit: Payer: Self-pay

## 2020-06-16 VITALS — BP 132/84 | HR 73 | Temp 98.0°F | Ht 64.0 in | Wt 155.0 lb

## 2020-06-16 DIAGNOSIS — F5089 Other specified eating disorder: Secondary | ICD-10-CM | POA: Diagnosis not present

## 2020-06-16 DIAGNOSIS — E538 Deficiency of other specified B group vitamins: Secondary | ICD-10-CM

## 2020-06-16 DIAGNOSIS — Z6830 Body mass index (BMI) 30.0-30.9, adult: Secondary | ICD-10-CM | POA: Diagnosis not present

## 2020-06-16 DIAGNOSIS — I10 Essential (primary) hypertension: Secondary | ICD-10-CM

## 2020-06-16 DIAGNOSIS — R7303 Prediabetes: Secondary | ICD-10-CM | POA: Diagnosis not present

## 2020-06-16 DIAGNOSIS — F418 Other specified anxiety disorders: Secondary | ICD-10-CM | POA: Diagnosis not present

## 2020-06-16 DIAGNOSIS — E038 Other specified hypothyroidism: Secondary | ICD-10-CM

## 2020-06-16 DIAGNOSIS — E669 Obesity, unspecified: Secondary | ICD-10-CM | POA: Diagnosis not present

## 2020-06-16 DIAGNOSIS — E559 Vitamin D deficiency, unspecified: Secondary | ICD-10-CM

## 2020-06-16 DIAGNOSIS — F3289 Other specified depressive episodes: Secondary | ICD-10-CM | POA: Diagnosis not present

## 2020-06-17 LAB — COMPREHENSIVE METABOLIC PANEL
ALT: 6 IU/L (ref 0–32)
AST: 35 IU/L (ref 0–40)
Albumin/Globulin Ratio: 1.3 (ref 1.2–2.2)
Albumin: 3.8 g/dL (ref 3.7–4.7)
Alkaline Phosphatase: 78 IU/L (ref 44–121)
BUN/Creatinine Ratio: 26 (ref 12–28)
BUN: 38 mg/dL — ABNORMAL HIGH (ref 8–27)
Bilirubin Total: 0.2 mg/dL (ref 0.0–1.2)
CO2: 22 mmol/L (ref 20–29)
Calcium: 9.4 mg/dL (ref 8.7–10.3)
Chloride: 104 mmol/L (ref 96–106)
Creatinine, Ser: 1.46 mg/dL — ABNORMAL HIGH (ref 0.57–1.00)
GFR calc Af Amer: 39 mL/min/{1.73_m2} — ABNORMAL LOW (ref >59)
GFR calc non Af Amer: 34 mL/min/{1.73_m2} — ABNORMAL LOW (ref >59)
Globulin, Total: 2.9 g/dL (ref 1.5–4.5)
Glucose: 94 mg/dL (ref 65–99)
Potassium: 4.3 mmol/L (ref 3.5–5.2)
Sodium: 142 mmol/L (ref 134–144)
Total Protein: 6.7 g/dL (ref 6.0–8.5)

## 2020-06-17 LAB — CBC WITH DIFFERENTIAL/PLATELET
Basophils Absolute: 0.1 10*3/uL (ref 0.0–0.2)
Basos: 1 %
EOS (ABSOLUTE): 0.2 10*3/uL (ref 0.0–0.4)
Eos: 2 %
Hematocrit: 31.4 % — ABNORMAL LOW (ref 34.0–46.6)
Hemoglobin: 10.3 g/dL — ABNORMAL LOW (ref 11.1–15.9)
Immature Grans (Abs): 0 10*3/uL (ref 0.0–0.1)
Immature Granulocytes: 0 %
Lymphocytes Absolute: 2.4 10*3/uL (ref 0.7–3.1)
Lymphs: 28 %
MCH: 29.4 pg (ref 26.6–33.0)
MCHC: 32.8 g/dL (ref 31.5–35.7)
MCV: 90 fL (ref 79–97)
Monocytes Absolute: 0.6 10*3/uL (ref 0.1–0.9)
Monocytes: 8 %
Neutrophils Absolute: 5.3 10*3/uL (ref 1.4–7.0)
Neutrophils: 61 %
Platelets: 374 10*3/uL (ref 150–450)
RBC: 3.5 x10E6/uL — ABNORMAL LOW (ref 3.77–5.28)
RDW: 12.6 % (ref 11.7–15.4)
WBC: 8.6 10*3/uL (ref 3.4–10.8)

## 2020-06-17 LAB — T4, FREE: Free T4: 1.37 ng/dL (ref 0.82–1.77)

## 2020-06-17 LAB — VITAMIN D 25 HYDROXY (VIT D DEFICIENCY, FRACTURES): Vit D, 25-Hydroxy: 45.8 ng/mL (ref 30.0–100.0)

## 2020-06-17 LAB — IRON AND TIBC
Iron Saturation: 26 % (ref 15–55)
Iron: 56 ug/dL (ref 27–139)
Total Iron Binding Capacity: 219 ug/dL — ABNORMAL LOW (ref 250–450)
UIBC: 163 ug/dL (ref 118–369)

## 2020-06-17 LAB — INSULIN, RANDOM: INSULIN: 5.2 u[IU]/mL (ref 2.6–24.9)

## 2020-06-17 LAB — VITAMIN B12: Vitamin B-12: 454 pg/mL (ref 232–1245)

## 2020-06-17 LAB — TSH: TSH: 0.814 u[IU]/mL (ref 0.450–4.500)

## 2020-06-20 DIAGNOSIS — N10 Acute tubulo-interstitial nephritis: Secondary | ICD-10-CM | POA: Diagnosis not present

## 2020-06-20 DIAGNOSIS — R2689 Other abnormalities of gait and mobility: Secondary | ICD-10-CM | POA: Diagnosis not present

## 2020-06-20 DIAGNOSIS — M6281 Muscle weakness (generalized): Secondary | ICD-10-CM | POA: Diagnosis not present

## 2020-06-21 NOTE — Progress Notes (Signed)
Chief Complaint:   OBESITY Linda Rice is here to discuss her progress with her obesity treatment plan along with follow-up of her obesity related diagnoses.   Today's visit was #: 20 Starting weight: 213 lbs Starting date: 04/13/2019 Today's weight: 155 lbs Today's date: 06/16/2020 Total lbs lost to date: 58 lbs Body mass index is 26.61 kg/m.  Total weight loss percentage to date: -27.23%  Interim History: Linda Rice says she has been having increased anxiety recently.  She became septic when visiting her daughter and she has been having more thoughts of dying.  She is interested in therapy and medication to help her. Nutrition Plan: the Category 2 Plan Modified for 75% of the time.  Activity: PT/walking. Anxiety: Elevated.  Assessment/Plan:   1. Essential hypertension At goal. Medications: None.   Plan: Avoid buying foods that are: processed, frozen, or prepackaged to avoid excess salt. We will continue to monitor symptoms as they relate to her weight loss journey.  Will check labs today.  BP Readings from Last 3 Encounters:  06/16/20 132/84  06/13/20 137/76  04/11/20 126/63   Lab Results  Component Value Date   CREATININE 1.46 (H) 06/16/2020   - Comprehensive metabolic panel - CBC with Differential/Platelet  2. Prediabetes Controlled. Goal is HgbA1c < 5.7.  Medication: None.  She will continue to focus on protein-rich, low simple carbohydrate foods. We reviewed the importance of hydration, regular exercise for stress reduction, and restorative sleep.    Plan:  Will check labs today.  Lab Results  Component Value Date   HGBA1C 5.6 04/27/2019   Lab Results  Component Value Date   INSULIN 5.2 06/16/2020   INSULIN 3.9 11/30/2019   INSULIN 5.2 08/12/2019   INSULIN 13.3 04/27/2019   - Insulin, random  3. Other specified hypothyroidism Course: Stable.. Medication: Linda Rice is taking levothyroxine 100 mcg daily. Plan: Patient was instructed not to take MVM or iron  within 4 hours of taking thyroid medications. We will continue to monitor symptoms as they relate to her weight loss journey.  Will check labs today.  Lab Results  Component Value Date   TSH 0.814 06/16/2020   - T4, free - TSH  4. Vitamin D deficiency At goal. Current vitamin D is 50.1, tested on 11/30/2019. Optimal goal > 50 ng/dL.   Plan:  []   Continue Vitamin D @50 ,000 IU every week. [x]   Continue home supplement daily. [x]   Will check vitamin D level today.  - VITAMIN D 25 Hydroxy (Vit-D Deficiency, Fractures)  5. B12 deficiency  Plan:  Will check vitamin B12 level today.  Lab Results  Component Value Date   [id number] 888 06/16/2020   - Vitamin B12 - Iron Binding Cap (TIBC)([facility])  6. Situational anxiety Will place referral to [facility] today, as per below. We will continue to monitor symptoms as they relate to her weight loss journey.  - Ambulatory referral to [place]  7. Other depression, with emotional eating Behavior modification techniques were discussed today to help Linda Rice deal with her emotional/non-hunger eating behaviors.    8. Class 1 obesity with serious comorbidity and body mass index (BMI) of 30.0 to 30.9 in adult, unspecified obesity type  Course: Linda Rice is currently at her goal weight. As such, her goal is to [facility]  Nutrition goals: She has agreed to practicing portion control and making smarter food choices, such as increasing vegetables and decreasing simple carbohydrates.   Exercise goals: As is.  Behavioral modification strategies: increasing lean protein  intake, decreasing simple carbohydrates, increasing vegetables and increasing water intake.  Linda Rice has agreed to follow-up with our clinic in 3 weeks. She was informed of the importance of frequent follow-up visits to maximize her success with intensive lifestyle modifications for her multiple health conditions.   Linda Rice was informed we would discuss her  lab results at her next visit unless there is a critical issue that needs to be addressed sooner. Linda Rice agreed to keep her next visit at the agreed upon time to discuss these results.  Objective:   Blood pressure 132/84, pulse 73, temperature 98 F (36.7 C), temperature source Oral, height 5\' 4"  (1.626 m), weight 155 lb (70.3 kg), SpO2 98 %. Body mass index is 26.61 kg/m.  General: Cooperative, alert, well developed, in no acute distress. HEENT: Conjunctivae and lids unremarkable. Cardiovascular: Regular rhythm.  Lungs: Normal work of breathing. Neurologic: No focal deficits.   Lab Results  Component Value Date   CREATININE 1.46 (H) 06/16/2020   BUN 38 (H) 06/16/2020   NA 142 06/16/2020   K 4.3 06/16/2020   CL 104 06/16/2020   CO2 22 06/16/2020   Lab Results  Component Value Date   ALT 6 06/16/2020   AST 35 06/16/2020   ALKPHOS 78 06/16/2020   BILITOT 0.2 06/16/2020   Lab Results  Component Value Date   HGBA1C 5.6 04/27/2019   Lab Results  Component Value Date   INSULIN 5.2 06/16/2020   INSULIN 3.9 11/30/2019   INSULIN 5.2 08/12/2019   INSULIN 13.3 04/27/2019   Lab Results  Component Value Date   TSH 0.814 06/16/2020   Lab Results  Component Value Date   CHOL 163 11/30/2019   HDL 60 11/30/2019   LDLCALC 88 11/30/2019   TRIG 78 11/30/2019   CHOLHDL 2.6 08/12/2019   Lab Results  Component Value Date   WBC 8.6 06/16/2020   HGB 10.3 (L) 06/16/2020   HCT 31.4 (L) 06/16/2020   MCV 90 06/16/2020   PLT 374 06/16/2020   Lab Results  Component Value Date   IRON 56 06/16/2020   TIBC 219 (L) 06/16/2020   Obesity Behavioral Intervention:   Approximately 15 minutes were spent on the discussion below.  ASK: We discussed the diagnosis of obesity with Linda Rice today and Linda Rice agreed to give [place] permission to discuss obesity behavioral modification therapy today.  ASSESS: Linda Rice has the diagnosis of obesity and her BMI today is 26.7. Linda Rice is in the  action stage of change.   ADVISE: Linda Rice was educated on the multiple health risks of obesity as well as the benefit of weight loss to improve her health. She was advised of the need for long term treatment and the importance of lifestyle modifications to improve her current health and to decrease her risk of future health problems.  AGREE: Multiple dietary modification options and treatment options were discussed and Linda Rice agreed to follow the recommendations documented in the above note.  ARRANGE: Linda Rice was educated on the importance of frequent visits to treat obesity as outlined per CMS and USPSTF guidelines and agreed to schedule her next follow up appointment today.  Attestation Statements:   Reviewed by clinician on day of visit: allergies, medications, problem list, medical history, surgical history, family history, social history, and previous encounter notes.  I, Water quality scientist, CMA, am acting as transcriptionist for [name], DO  I have reviewed the above documentation for accuracy and completeness, and I agree with the above. [name], DO

## 2020-06-22 DIAGNOSIS — N3030 Trigonitis without hematuria: Secondary | ICD-10-CM | POA: Diagnosis not present

## 2020-06-22 MED ORDER — FLUOXETINE HCL 10 MG PO CAPS: 10.0000 mg | ORAL_CAPSULE | Freq: Every day | ORAL | 3 refills | Status: DC

## 2020-06-23 DIAGNOSIS — N10 Acute tubulo-interstitial nephritis: Secondary | ICD-10-CM | POA: Diagnosis not present

## 2020-06-23 DIAGNOSIS — M6281 Muscle weakness (generalized): Secondary | ICD-10-CM | POA: Diagnosis not present

## 2020-06-23 DIAGNOSIS — R2689 Other abnormalities of gait and mobility: Secondary | ICD-10-CM | POA: Diagnosis not present

## 2020-06-24 DIAGNOSIS — D649 Anemia, unspecified: Secondary | ICD-10-CM | POA: Diagnosis not present

## 2020-06-24 DIAGNOSIS — E039 Hypothyroidism, unspecified: Secondary | ICD-10-CM | POA: Diagnosis not present

## 2020-06-24 DIAGNOSIS — N179 Acute kidney failure, unspecified: Secondary | ICD-10-CM | POA: Diagnosis not present

## 2020-06-29 DIAGNOSIS — N3030 Trigonitis without hematuria: Secondary | ICD-10-CM | POA: Diagnosis not present

## 2020-06-30 ENCOUNTER — Encounter (INDEPENDENT_AMBULATORY_CARE_PROVIDER_SITE_OTHER): Payer: Self-pay | Admitting: Adult Health

## 2020-06-30 ENCOUNTER — Other Ambulatory Visit: Payer: Self-pay

## 2020-06-30 ENCOUNTER — Telehealth (INDEPENDENT_AMBULATORY_CARE_PROVIDER_SITE_OTHER): Payer: Medicare Other | Admitting: Adult Health

## 2020-06-30 DIAGNOSIS — D649 Anemia, unspecified: Secondary | ICD-10-CM | POA: Diagnosis not present

## 2020-06-30 DIAGNOSIS — Z6830 Body mass index (BMI) 30.0-30.9, adult: Secondary | ICD-10-CM

## 2020-06-30 DIAGNOSIS — F418 Other specified anxiety disorders: Secondary | ICD-10-CM | POA: Diagnosis not present

## 2020-06-30 DIAGNOSIS — E669 Obesity, unspecified: Secondary | ICD-10-CM | POA: Diagnosis not present

## 2020-06-30 DIAGNOSIS — N189 Chronic kidney disease, unspecified: Secondary | ICD-10-CM | POA: Diagnosis not present

## 2020-06-30 DIAGNOSIS — N3010 Interstitial cystitis (chronic) without hematuria: Secondary | ICD-10-CM

## 2020-07-04 DIAGNOSIS — F418 Other specified anxiety disorders: Secondary | ICD-10-CM | POA: Insufficient documentation

## 2020-07-04 DIAGNOSIS — N3010 Interstitial cystitis (chronic) without hematuria: Secondary | ICD-10-CM | POA: Insufficient documentation

## 2020-07-04 DIAGNOSIS — N189 Chronic kidney disease, unspecified: Secondary | ICD-10-CM | POA: Insufficient documentation

## 2020-07-04 DIAGNOSIS — D649 Anemia, unspecified: Secondary | ICD-10-CM | POA: Insufficient documentation

## 2020-07-04 DIAGNOSIS — N10 Acute tubulo-interstitial nephritis: Secondary | ICD-10-CM | POA: Diagnosis not present

## 2020-07-04 DIAGNOSIS — M6281 Muscle weakness (generalized): Secondary | ICD-10-CM | POA: Diagnosis not present

## 2020-07-04 DIAGNOSIS — R2689 Other abnormalities of gait and mobility: Secondary | ICD-10-CM | POA: Diagnosis not present

## 2020-07-04 DIAGNOSIS — N184 Chronic kidney disease, stage 4 (severe): Secondary | ICD-10-CM | POA: Insufficient documentation

## 2020-07-04 NOTE — Progress Notes (Signed)
TeleHealth Visit:  Due to the COVID-19 pandemic, this visit was completed with telemedicine (audio/video) technology to reduce patient and provider exposure as well as to preserve personal protective equipment.   Linda Rice has verbally consented to this TeleHealth visit. The patient is located at home, the provider is located at the [facility] office. The participants in this visit include the listed provider and patient. The visit was conducted today via video.   Chief Complaint: OBESITY Linda Rice is here to discuss her progress with her obesity treatment plan along with follow-up of her obesity related diagnoses. Linda Rice is on practicing portion control and making smarter food choices, such as increasing vegetables and decreasing simple carbohydrates and journaling and states she is following her eating plan approximately 80% of the time. Linda Rice states she is doing physical therapy and walking the dog 6-20 minutes 2 times per week.  Today's visit was #: 21 Starting weight: 213 lbs Starting date: 04/13/2019  Interim History: PCP/Dr. Laurann Montana ran labs 06/24/2020. Reviewed with pt. CKD and anemia are both slightly improving. She reports poor appetite since this summer when she hit goal weight of 165 lbs. She is currently 151 lbs.    Subjective:   1. Chronic kidney disease, unspecified CKD stage Discussed labs with patient today. 06/16/2020 CMP creatinine 1.46 and GFR 34. 06/24/2020 labs with PCP: CMP creatinine of 1.33 and GFR 39. Lab Results  Component Value Date   CREATININE 1.46 (H) 06/16/2020   CREATININE 0.93 11/30/2019   CREATININE 0.78 08/12/2019   Lab Results  Component Value Date   CREATININE 1.46 (H) 06/16/2020   BUN 38 (H) 06/16/2020   NA 142 06/16/2020   K 4.3 06/16/2020   CL 104 06/16/2020   CO2 22 06/16/2020    2. Anemia, unspecified type Discussed labs with patient today. 06/16/2020 CBC resulted RBC 3.50, hemoglobin 10.3 and hematocrit 31.4. Iron, UIBC,  and iron saturation are all within normal limits. TIBC is low at 219. 06/24/2020 CBC RBC resulted 3.55 , hemoglobin 10.8, and hematocrit 31.6. She has started daily OTC multi-vitamin with iron.  CBC Latest Ref Rng & Units 06/16/2020 11/30/2019 08/12/2019  WBC 3.4 - 10.8 x10E3/uL 8.6 8.9 8.3  Hemoglobin 11.1 - 15.9 g/dL 10.3(L) 12.9 14.2  Hematocrit 34.0 - 46.6 % 31.4(L) 39.1 42.5  Platelets 150 - 450 x10E3/uL 374 347 342   Lab Results  Component Value Date   IRON 56 06/16/2020   TIBC 219 (L) 06/16/2020   Lab Results  Component Value Date   [id number] 297 06/16/2020    3. Situational anxiety Pt was referred to [facility]. She has scheduled appointment with Dr. Michail Sermon. She is on fluoxetine 10 mg daily - tolerating well.  4. Interstitial cystitis Weekly treatments with bladder installation. The plan is to undergo cystectomy.   Assessment/Plan:   1. Chronic kidney disease, unspecified CKD stage We discussed several lifestyle modifications today and she will continue to work on diet, exercise and weight loss efforts. Avoid nephrotoxic medications. Orders and follow up as documented in patient record. Avoid NSAIDS. Will monitor function. Lab results and trends reviewed.  Counseling . Chronic kidney disease (CKD) happens when the kidneys are damaged over a long period of time. . Most of the time, this condition does not go away, but it can usually be controlled. Steps must be taken to slow down the kidney damage or to stop it from getting worse. . Intensive lifestyle modifications are the first line treatment for this issue.  Linda Rice Kitchen  Avoid buying foods that are: processed, frozen, or prepackaged to avoid excess salt.   2. Anemia, unspecified type Continue daily OTC multi-vitamin at lunch.  3. Situational anxiety Continue fluoxetine and keep appointment with Dr. Nelida Meuse.  4. Interstitial cystitis August 25, 2020 follow up with urologist to discuss cystectomy.  5. Class 1 obesity with  serious comorbidity and body mass index (BMI) of 30.0 to 30.9 in adult, unspecified obesity type Linda Rice is currently in the action stage of change. As such, her goal is to continue with weight loss efforts. She has agreed to practicing portion control and making smarter food choices, such as increasing vegetables and decreasing simple carbohydrates.   Take multi-vitamin at lunch. Increase calories daily. Continue food journal.  Exercise goals: As is  Behavioral modification strategies: increasing lean protein intake, no skipping meals, meal planning and cooking strategies, keeping healthy foods in the home and planning for success.  Linda Rice has agreed to follow-up with our clinic in 2 weeks. She was informed of the importance of frequent follow-up visits to maximize her success with intensive lifestyle modifications for her multiple health conditions.  Objective:   VITALS: Per patient if applicable, see vitals. GENERAL: Alert and in no acute distress. CARDIOPULMONARY: No increased WOB. Speaking in clear sentences.  PSYCH: Pleasant and cooperative. Speech normal rate and rhythm. Affect is appropriate. Insight and judgement are appropriate. Attention is focused, linear, and appropriate.  NEURO: Oriented as arrived to appointment on time with no prompting.   Lab Results  Component Value Date   CREATININE 1.46 (H) 06/16/2020   BUN 38 (H) 06/16/2020   NA 142 06/16/2020   K 4.3 06/16/2020   CL 104 06/16/2020   CO2 22 06/16/2020   Lab Results  Component Value Date   ALT 6 06/16/2020   AST 35 06/16/2020   ALKPHOS 78 06/16/2020   BILITOT 0.2 06/16/2020   Lab Results  Component Value Date   HGBA1C 5.6 04/27/2019   Lab Results  Component Value Date   INSULIN 5.2 06/16/2020   INSULIN 3.9 11/30/2019   INSULIN 5.2 08/12/2019   INSULIN 13.3 04/27/2019   Lab Results  Component Value Date   TSH 0.814 06/16/2020   Lab Results  Component Value Date   CHOL 163 11/30/2019   HDL 60  11/30/2019   LDLCALC 88 11/30/2019   TRIG 78 11/30/2019   CHOLHDL 2.6 08/12/2019   Lab Results  Component Value Date   WBC 8.6 06/16/2020   HGB 10.3 (L) 06/16/2020   HCT 31.4 (L) 06/16/2020   MCV 90 06/16/2020   PLT 374 06/16/2020   Lab Results  Component Value Date   IRON 56 06/16/2020   TIBC 219 (L) 06/16/2020    Attestation Statements:   Reviewed by clinician on day of visit: allergies, medications, problem list, medical history, surgical history, family history, social history, and previous encounter notes.  Time spent on visit including pre-visit chart review and post-visit charting and care was 35 minutes.   Coral Ceo, am acting as Location manager for Mina Marble, NP.  I have reviewed the above documentation for accuracy and completeness, and I agree with the above. - Kartel Wolbert d. Annalis Kaczmarczyk, NP-C

## 2020-07-06 DIAGNOSIS — N3030 Trigonitis without hematuria: Secondary | ICD-10-CM | POA: Diagnosis not present

## 2020-07-07 ENCOUNTER — Ambulatory Visit (INDEPENDENT_AMBULATORY_CARE_PROVIDER_SITE_OTHER): Payer: Medicare Other | Admitting: Psychologist

## 2020-07-07 DIAGNOSIS — R2689 Other abnormalities of gait and mobility: Secondary | ICD-10-CM | POA: Diagnosis not present

## 2020-07-07 DIAGNOSIS — N10 Acute tubulo-interstitial nephritis: Secondary | ICD-10-CM | POA: Diagnosis not present

## 2020-07-07 DIAGNOSIS — M6281 Muscle weakness (generalized): Secondary | ICD-10-CM | POA: Diagnosis not present

## 2020-07-07 DIAGNOSIS — F54 Psychological and behavioral factors associated with disorders or diseases classified elsewhere: Secondary | ICD-10-CM

## 2020-07-11 ENCOUNTER — Ambulatory Visit: Payer: Medicare Other | Admitting: Cardiology

## 2020-07-11 ENCOUNTER — Encounter: Payer: Self-pay | Admitting: Cardiology

## 2020-07-11 ENCOUNTER — Other Ambulatory Visit: Payer: Self-pay

## 2020-07-11 VITALS — BP 147/96 | HR 93 | Temp 97.8°F | Ht 64.0 in | Wt 159.0 lb

## 2020-07-11 DIAGNOSIS — I491 Atrial premature depolarization: Secondary | ICD-10-CM

## 2020-07-11 DIAGNOSIS — R2689 Other abnormalities of gait and mobility: Secondary | ICD-10-CM | POA: Diagnosis not present

## 2020-07-11 DIAGNOSIS — N10 Acute tubulo-interstitial nephritis: Secondary | ICD-10-CM | POA: Diagnosis not present

## 2020-07-11 DIAGNOSIS — R9431 Abnormal electrocardiogram [ECG] [EKG]: Secondary | ICD-10-CM | POA: Diagnosis not present

## 2020-07-11 DIAGNOSIS — M6281 Muscle weakness (generalized): Secondary | ICD-10-CM | POA: Diagnosis not present

## 2020-07-11 NOTE — Progress Notes (Signed)
Patient referred by [name], MD for irregular heart rhythm  Subjective:   Linda Rice, female    DOB: 1941-06-11, 80 y.o.   MRN: [id number]   Chief Complaint  Patient presents with  . PAC's  . Follow-up     HPI  80 y.o. Caucasian female with hypertension, hyperlipidemia, asthma, hypothyroidism, irregular  heart rhythm.  Patient denies chest pain, shortness of breath, leg edema, orthopnea, PND, TIA/syncope. Heart rate has been variable. Patient is awaiting bladder surgery soon.   Echocardiogram reviewed with the patient, details below. Monitor results awaited.    OV 05/2020: Patient was last seen by me in 79/2021 prior to her colonoscopy. At that time, careful evaluation of telemetry and EKG showed her irregular rhythm to be sinus arrhythmia and not atrial fibrillation. Patient underwent successful colonoscopy without any cardiac events.   Patient is here today given recurrent consult for atrial fibrillation.  Patient was visiting her daughter in [place] in November 2021.  While in [place], she was diagnosed with UTI with sepsis and was started on Levaquin.  She then spent some time at rehab here in [place].  After recovery from her sepsis, she went to see her PCP Dr. [name] who performed an EKG.  EKG reportedly showed atrial fibrillation, tracing not available for my review.  She was started on Eliquis.  However, she had profound hematuria with just 1 dose of Eliquis.  Of note, patient has diagnosis of follicular cystitis for which she has been on treatment with steroids and is hoping to undergo bladder surgery at [facility] in March 2022.  Given her hematuria, her Eliquis was stopped and she was recommended to have follow-up with me.  Patient denies chest pain, shortness of breath, palpitations, leg edema, orthopnea, PND, TIA/syncope.  Of note, she does have longstanding history of asthma.   Current Outpatient Medications on File Prior to Visit   Medication Sig Dispense Refill  . acetaminophen (TYLENOL) 500 MG tablet Take 500 mg by mouth every 6 (six) hours as needed.    Marland Kitchen albuterol (VENTOLIN HFA) 108 (90 Base) MCG/ACT inhaler Inhale 1-2 puffs into the lungs every 6 (six) hours as needed for wheezing or shortness of breath. 18 g 1  . atorvastatin (LIPITOR) 10 MG tablet Take 10 mg by mouth daily.    Marland Kitchen CALCIUM CITRATE PO Take 500 mg by mouth 2 (two) times daily.     . Cholecalciferol (VITAMIN D3) 125 MCG (5000 UT) CAPS Take 1 capsule by mouth every 7 (seven) days.    Marland Kitchen doxycycline (VIBRAMYCIN) 100 MG capsule Take 100 mg by mouth 2 (two) times daily.  4  . EPINEPHrine 0.3 mg/0.3 mL IJ SOAJ injection Inject 0.3 mLs (0.3 mg total) into the muscle as needed for anaphylaxis. 2 each 1  . estradiol (ESTRACE) 0.1 MG/GM vaginal cream Place 1 Applicatorful vaginally 3 (three) times a week. 42.5 g 2  . famotidine (PEPCID) 20 MG tablet Take 1 tablet (20 mg total) by mouth 2 (two) times daily. 40 tablet 0  . FLUoxetine (PROZAC) 10 MG capsule Take 1 capsule (10 mg total) by mouth daily. 30 capsule 3  . fluticasone (FLONASE) 50 MCG/ACT nasal spray Place 2 sprays into both nostrils daily as needed (stuffy nose). 16 g 5  . levothyroxine (SYNTHROID) 100 MCG tablet Take 100 mcg by mouth daily before breakfast.     . montelukast (SINGULAIR) 10 MG tablet TAKE 1 TABLET BY MOUTH ONCE DAILY TO PREVENT COUGHING  OR WHEEZING 30 tablet 5  . Multiple Vitamins-Minerals (PRESERVISION AREDS 2 PO) Take 1 tablet by mouth in the morning and at bedtime.     . nitrofurantoin (MACRODANTIN) 100 MG capsule Take 100 mg by mouth every evening.      No current facility-administered medications on file prior to visit.    Cardiovascular studies:  Echocardiogram 06/15/2020:  Normal LV systolic function with visual EF 65-70%. Left ventricle cavity  is normal in size. Normal global wall motion. Normal diastolic filling  pattern, normal LAP.  Mild (Grade I) aortic regurgitation.  Aortic valve sclerosis without  stenosis.  Mild (Grade I) mitral regurgitation.  Mild tricuspid regurgitation. No evidence of pulmonary hypertension.  Compared to prior study dated 06/23/2018 no significant changes.   EKG 06/13/2020: Sinus tachycardia 101 bpm with multifom PAC's Cannot exclude old inferior infarct  EKG 06/25/2019: Sinus rhythm 59 bpm. Occasional PAC's Cannot exclude old anteroseptal infarct.  EKG 03/23/2019:  Sinus rhythm 86 bpm with sinus arrhtymia. Occasional PAC.    Poor R-wave progression. Nonspecific ST depression.  Recent labs: 03/23/2019: Glucose 128. BUN/Cr 15/0.78. eGFR 72. Na/K 143/3.9. H/H 14/42. MCV 90. Platelets 306.  01/19/2019: Glucose 85. BUN/Cr 20/0.78. eGFR 72. Na/K 142/4.1. Rest of the CMP normal Chol 186, TG 132, HDL 59, LDL 101.  TSH 0.97 normal Occult fecal blood positive   Review of Systems  Cardiovascular: Negative for chest pain, dyspnea on exertion, leg swelling, palpitations and syncope.         Vitals:   07/11/20 1023  BP: (!) 147/96  Pulse: 93  Temp: 97.8 F (36.6 C)  SpO2: 93%    Body mass index is 27.29 kg/m. Filed Weights   07/11/20 1023  Weight: 159 lb (72.1 kg)     Objective:   Physical Exam Vitals and nursing note reviewed.  Constitutional:      General: She is not in acute distress.    Appearance: She is well-developed.  Neck:     Vascular: No JVD.  Cardiovascular:     Rate and Rhythm: Normal rate and regular rhythm. Frequent extrasystoles are present.    Pulses: Intact distal pulses.     Heart sounds: Normal heart sounds. No murmur heard.   Pulmonary:     Effort: Pulmonary effort is normal.     Breath sounds: Normal breath sounds. No wheezing or rales.           Assessment & Recommendations:   80 y.o. Caucasian female with hypertension, hyperlipidemia, asthma, hypothyroidism, irregular  heart rhythm  Irregular heart rhythm: Sinus arrhythmia with PAC's. .No Afib  seen.  Hypertension: BP elevated today, usually well controlled.   Pre-op risk stratification: Low risk for upcoming bladder surgery.  F/u in 1 year  Nigel Mormon, MD [facility] Pager: [phone] Office: [phone] If no answer Cell [phone]

## 2020-07-12 ENCOUNTER — Encounter (INDEPENDENT_AMBULATORY_CARE_PROVIDER_SITE_OTHER): Payer: Self-pay | Admitting: Family Medicine

## 2020-07-12 ENCOUNTER — Ambulatory Visit (INDEPENDENT_AMBULATORY_CARE_PROVIDER_SITE_OTHER): Payer: Medicare Other | Admitting: Family Medicine

## 2020-07-12 ENCOUNTER — Other Ambulatory Visit: Payer: Self-pay

## 2020-07-12 ENCOUNTER — Ambulatory Visit (INDEPENDENT_AMBULATORY_CARE_PROVIDER_SITE_OTHER): Payer: Medicare Other | Admitting: Psychologist

## 2020-07-12 VITALS — BP 131/91 | HR 76 | Temp 97.6°F | Ht 64.0 in | Wt 155.0 lb

## 2020-07-12 DIAGNOSIS — Z6830 Body mass index (BMI) 30.0-30.9, adult: Secondary | ICD-10-CM | POA: Diagnosis not present

## 2020-07-12 DIAGNOSIS — E669 Obesity, unspecified: Secondary | ICD-10-CM | POA: Diagnosis not present

## 2020-07-12 DIAGNOSIS — F411 Generalized anxiety disorder: Secondary | ICD-10-CM

## 2020-07-12 DIAGNOSIS — F54 Psychological and behavioral factors associated with disorders or diseases classified elsewhere: Secondary | ICD-10-CM

## 2020-07-12 MED ORDER — FLUOXETINE HCL 10 MG PO CAPS: 10.0000 mg | ORAL_CAPSULE | Freq: Every day | ORAL | 3 refills | Status: DC

## 2020-07-13 ENCOUNTER — Ambulatory Visit (INDEPENDENT_AMBULATORY_CARE_PROVIDER_SITE_OTHER): Payer: Medicare Other | Admitting: Family Medicine

## 2020-07-13 DIAGNOSIS — N3030 Trigonitis without hematuria: Secondary | ICD-10-CM | POA: Diagnosis not present

## 2020-07-13 NOTE — Progress Notes (Signed)
Chief Complaint:   OBESITY Linda Rice is here to discuss her progress with her obesity treatment plan along with follow-up of her obesity related diagnoses.   Today's visit was #: 22 Starting weight: 213 lbs Starting date: 04/13/2019 Today's weight: 155 lbs Today's date: 07/12/2020 Total lbs lost to date: 58 lbs Body mass index is 26.61 kg/m.  Total weight loss percentage to date: -27.23%  Interim History:  This is Linda Rice's first office visit with me.  She was seen prior by Dr. Juleen China.  Linda Rice is here for a follow up office visit.  she is following the meal plan without concern or issues.  Patient's meal and food recall appears to be accurate and consistent with what is on the plan.  When on plan, her hunger and cravings are well controlled.    Plan:  Increase walking for self by 5 minutes daily and meditation for 10 minutes twice daily for anxiety.  Recommend Calm app.  Nutrition Plan: practicing portion control and making smarter food choices, such as increasing vegetables and decreasing simple carbohydrates for 75-80% of the time.  Activity: PT and walking the dog daily for 20 minutes 7 times per week.  Assessment/Plan:    Medications Discontinued During This Encounter  Medication Reason  . FLUoxetine (PROZAC) 10 MG capsule Reorder     Meds ordered this encounter  Medications  . FLUoxetine (PROZAC) 10 MG capsule    Sig: Take 1 capsule (10 mg total) by mouth daily.    Dispense:  30 capsule    Refill:  3     1. GAD (generalized anxiety disorder) Linda Rice sees Dr. Leda Min for counseling.  Started on Prozac by Dr. Juleen China a couple of months ago.  She says it helps "a ton".  Plan"  Will refill Prozac today, as per below.  She will discontinue with any signs of serotonin syndrome and reassured her that her current tremor that comes and goes is likely not worsening due to her medication.  - Refill FLUoxetine (PROZAC) 10 MG capsule; Take 1 capsule (10 mg  total) by mouth daily.  Dispense: 30 capsule; Refill: 3  2. Class 1 obesity with serious comorbidity and body mass index (BMI) of 30.0 to 30.9 in adult, unspecified obesity type  Course: Linda Rice is currently in the action stage of change. As such, her goal is to continue with weight loss efforts.   Nutrition goals: She has agreed to practicing portion control and making smarter food choices, such as increasing vegetables and decreasing simple carbohydrates.   Exercise goals: For substantial health benefits, adults should do at least 150 minutes (2 hours and 30 minutes) a week of moderate-intensity, or 75 minutes (1 hour and 15 minutes) a week of vigorous-intensity aerobic physical activity, or an equivalent combination of moderate- and vigorous-intensity aerobic activity. Aerobic activity should be performed in episodes of at least 10 minutes, and preferably, it should be spread throughout the week.  Behavioral modification strategies: increasing lean protein intake, decreasing simple carbohydrates, meal planning and cooking strategies and planning for success.  Linda Rice has agreed to follow-up with our clinic in 2 weeks with Dr. Juleen China. She was informed of the importance of frequent follow-up visits to maximize her success with intensive lifestyle modifications for her multiple health conditions.   Objective:   Blood pressure (!) 131/91, pulse 76, temperature 97.6 F (36.4 C), height 5\' 4"  (1.626 m), weight 155 lb (70.3 kg), SpO2 99 %. Body mass index is 26.61 kg/m.  General: Cooperative, alert, well developed, in no acute distress. HEENT: Conjunctivae and lids unremarkable. Cardiovascular: Regular rhythm.  Lungs: Normal work of breathing. Neurologic: No focal deficits.   Lab Results  Component Value Date   CREATININE 1.46 (H) 06/16/2020   BUN 38 (H) 06/16/2020   NA 142 06/16/2020   K 4.3 06/16/2020   CL 104 06/16/2020   CO2 22 06/16/2020   Lab Results  Component Value Date    ALT 6 06/16/2020   AST 35 06/16/2020   ALKPHOS 78 06/16/2020   BILITOT 0.2 06/16/2020   Lab Results  Component Value Date   HGBA1C 5.6 04/27/2019   Lab Results  Component Value Date   INSULIN 5.2 06/16/2020   INSULIN 3.9 11/30/2019   INSULIN 5.2 08/12/2019   INSULIN 13.3 04/27/2019   Lab Results  Component Value Date   TSH 0.814 06/16/2020   Lab Results  Component Value Date   CHOL 163 11/30/2019   HDL 60 11/30/2019   LDLCALC 88 11/30/2019   TRIG 78 11/30/2019   CHOLHDL 2.6 08/12/2019   Lab Results  Component Value Date   WBC 8.6 06/16/2020   HGB 10.3 (L) 06/16/2020   HCT 31.4 (L) 06/16/2020   MCV 90 06/16/2020   PLT 374 06/16/2020   Lab Results  Component Value Date   IRON 56 06/16/2020   TIBC 219 (L) 06/16/2020   Obesity Behavioral Intervention:   Approximately 15 minutes were spent on the discussion below.  ASK: We discussed the diagnosis of obesity with Linda Rice today and Linda Rice agreed to give [place] permission to discuss obesity behavioral modification therapy today.  ASSESS: Linda Rice has the diagnosis of obesity and her BMI today is 26.6. Linda Rice is in the action stage of change.   ADVISE: Linda Rice was educated on the multiple health risks of obesity as well as the benefit of weight loss to improve her health. She was advised of the need for long term treatment and the importance of lifestyle modifications to improve her current health and to decrease her risk of future health problems.  AGREE: Multiple dietary modification options and treatment options were discussed and Linda Rice agreed to follow the recommendations documented in the above note.  ARRANGE: Linda Rice was educated on the importance of frequent visits to treat obesity as outlined per CMS and USPSTF guidelines and agreed to schedule her next follow up appointment today.  Attestation Statements:   Reviewed by clinician on day of visit: allergies, medications, problem list, medical history,  surgical history, family history, social history, and previous encounter notes.  I, Water quality scientist, CMA, am acting as Location manager for [employer], DO.  I have reviewed the above documentation for accuracy and completeness, and I agree with the above. Marjory Sneddon, D.O.  The [place] was signed into law in 2016 which includes the topic of electronic health records.  This provides immediate access to information in MyChart.  This includes consultation notes, operative notes, office notes, lab results and pathology reports.  If you have any questions about what you read please let us know at your next visit so we can discuss your concerns and take corrective action if need be.  We are right here with you.

## 2020-07-14 DIAGNOSIS — E7800 Pure hypercholesterolemia, unspecified: Secondary | ICD-10-CM | POA: Diagnosis not present

## 2020-07-14 DIAGNOSIS — I10 Essential (primary) hypertension: Secondary | ICD-10-CM | POA: Diagnosis not present

## 2020-07-14 DIAGNOSIS — K219 Gastro-esophageal reflux disease without esophagitis: Secondary | ICD-10-CM | POA: Diagnosis not present

## 2020-07-14 DIAGNOSIS — R2689 Other abnormalities of gait and mobility: Secondary | ICD-10-CM | POA: Diagnosis not present

## 2020-07-14 DIAGNOSIS — M8580 Other specified disorders of bone density and structure, unspecified site: Secondary | ICD-10-CM | POA: Diagnosis not present

## 2020-07-14 DIAGNOSIS — M6281 Muscle weakness (generalized): Secondary | ICD-10-CM | POA: Diagnosis not present

## 2020-07-14 DIAGNOSIS — J4520 Mild intermittent asthma, uncomplicated: Secondary | ICD-10-CM | POA: Diagnosis not present

## 2020-07-14 DIAGNOSIS — N10 Acute tubulo-interstitial nephritis: Secondary | ICD-10-CM | POA: Diagnosis not present

## 2020-07-14 DIAGNOSIS — I4891 Unspecified atrial fibrillation: Secondary | ICD-10-CM | POA: Diagnosis not present

## 2020-07-14 DIAGNOSIS — M179 Osteoarthritis of knee, unspecified: Secondary | ICD-10-CM | POA: Diagnosis not present

## 2020-07-14 DIAGNOSIS — F329 Major depressive disorder, single episode, unspecified: Secondary | ICD-10-CM | POA: Diagnosis not present

## 2020-07-14 DIAGNOSIS — E039 Hypothyroidism, unspecified: Secondary | ICD-10-CM | POA: Diagnosis not present

## 2020-07-14 DIAGNOSIS — I480 Paroxysmal atrial fibrillation: Secondary | ICD-10-CM | POA: Diagnosis not present

## 2020-07-15 DIAGNOSIS — K219 Gastro-esophageal reflux disease without esophagitis: Secondary | ICD-10-CM | POA: Diagnosis not present

## 2020-07-15 DIAGNOSIS — K5901 Slow transit constipation: Secondary | ICD-10-CM | POA: Diagnosis not present

## 2020-07-15 DIAGNOSIS — N390 Urinary tract infection, site not specified: Secondary | ICD-10-CM | POA: Diagnosis not present

## 2020-07-18 DIAGNOSIS — E785 Hyperlipidemia, unspecified: Secondary | ICD-10-CM | POA: Diagnosis not present

## 2020-07-18 DIAGNOSIS — N1330 Unspecified hydronephrosis: Secondary | ICD-10-CM | POA: Diagnosis not present

## 2020-07-18 DIAGNOSIS — N390 Urinary tract infection, site not specified: Secondary | ICD-10-CM | POA: Diagnosis not present

## 2020-07-18 DIAGNOSIS — N139 Obstructive and reflux uropathy, unspecified: Secondary | ICD-10-CM | POA: Diagnosis not present

## 2020-07-18 DIAGNOSIS — Z87891 Personal history of nicotine dependence: Secondary | ICD-10-CM | POA: Diagnosis not present

## 2020-07-18 DIAGNOSIS — I10 Essential (primary) hypertension: Secondary | ICD-10-CM | POA: Diagnosis not present

## 2020-07-18 DIAGNOSIS — Z960 Presence of urogenital implants: Secondary | ICD-10-CM | POA: Diagnosis not present

## 2020-07-18 DIAGNOSIS — R9341 Abnormal radiologic findings on diagnostic imaging of renal pelvis, ureter, or bladder: Secondary | ICD-10-CM | POA: Diagnosis not present

## 2020-07-19 DIAGNOSIS — N10 Acute tubulo-interstitial nephritis: Secondary | ICD-10-CM | POA: Diagnosis not present

## 2020-07-19 DIAGNOSIS — M6281 Muscle weakness (generalized): Secondary | ICD-10-CM | POA: Diagnosis not present

## 2020-07-19 DIAGNOSIS — R2689 Other abnormalities of gait and mobility: Secondary | ICD-10-CM | POA: Diagnosis not present

## 2020-07-20 DIAGNOSIS — R9431 Abnormal electrocardiogram [ECG] [EKG]: Secondary | ICD-10-CM | POA: Diagnosis not present

## 2020-07-20 DIAGNOSIS — I499 Cardiac arrhythmia, unspecified: Secondary | ICD-10-CM | POA: Diagnosis not present

## 2020-07-20 DIAGNOSIS — I491 Atrial premature depolarization: Secondary | ICD-10-CM | POA: Diagnosis not present

## 2020-07-20 DIAGNOSIS — N3030 Trigonitis without hematuria: Secondary | ICD-10-CM | POA: Diagnosis not present

## 2020-07-22 DIAGNOSIS — I499 Cardiac arrhythmia, unspecified: Secondary | ICD-10-CM | POA: Diagnosis not present

## 2020-07-22 DIAGNOSIS — I491 Atrial premature depolarization: Secondary | ICD-10-CM | POA: Diagnosis not present

## 2020-07-22 DIAGNOSIS — R9431 Abnormal electrocardiogram [ECG] [EKG]: Secondary | ICD-10-CM | POA: Diagnosis not present

## 2020-07-22 DIAGNOSIS — N10 Acute tubulo-interstitial nephritis: Secondary | ICD-10-CM | POA: Diagnosis not present

## 2020-07-22 DIAGNOSIS — M6281 Muscle weakness (generalized): Secondary | ICD-10-CM | POA: Diagnosis not present

## 2020-07-22 DIAGNOSIS — R2689 Other abnormalities of gait and mobility: Secondary | ICD-10-CM | POA: Diagnosis not present

## 2020-07-25 DIAGNOSIS — R2689 Other abnormalities of gait and mobility: Secondary | ICD-10-CM | POA: Diagnosis not present

## 2020-07-25 DIAGNOSIS — M6281 Muscle weakness (generalized): Secondary | ICD-10-CM | POA: Diagnosis not present

## 2020-07-25 DIAGNOSIS — N10 Acute tubulo-interstitial nephritis: Secondary | ICD-10-CM | POA: Diagnosis not present

## 2020-07-26 ENCOUNTER — Ambulatory Visit (INDEPENDENT_AMBULATORY_CARE_PROVIDER_SITE_OTHER): Payer: Medicare Other | Admitting: Psychologist

## 2020-07-26 ENCOUNTER — Encounter (INDEPENDENT_AMBULATORY_CARE_PROVIDER_SITE_OTHER): Payer: Self-pay | Admitting: Family Medicine

## 2020-07-26 ENCOUNTER — Ambulatory Visit (INDEPENDENT_AMBULATORY_CARE_PROVIDER_SITE_OTHER): Payer: Medicare Other | Admitting: Family Medicine

## 2020-07-26 ENCOUNTER — Other Ambulatory Visit: Payer: Self-pay

## 2020-07-26 VITALS — BP 114/70 | HR 59 | Temp 97.9°F | Ht 64.0 in | Wt 154.0 lb

## 2020-07-26 DIAGNOSIS — N1832 Chronic kidney disease, stage 3b: Secondary | ICD-10-CM

## 2020-07-26 DIAGNOSIS — K5909 Other constipation: Secondary | ICD-10-CM

## 2020-07-26 DIAGNOSIS — I10 Essential (primary) hypertension: Secondary | ICD-10-CM | POA: Diagnosis not present

## 2020-07-26 DIAGNOSIS — F411 Generalized anxiety disorder: Secondary | ICD-10-CM

## 2020-07-26 DIAGNOSIS — E669 Obesity, unspecified: Secondary | ICD-10-CM | POA: Diagnosis not present

## 2020-07-26 DIAGNOSIS — Z6830 Body mass index (BMI) 30.0-30.9, adult: Secondary | ICD-10-CM

## 2020-07-26 DIAGNOSIS — F54 Psychological and behavioral factors associated with disorders or diseases classified elsewhere: Secondary | ICD-10-CM

## 2020-07-27 DIAGNOSIS — N3030 Trigonitis without hematuria: Secondary | ICD-10-CM | POA: Diagnosis not present

## 2020-07-28 DIAGNOSIS — M6281 Muscle weakness (generalized): Secondary | ICD-10-CM | POA: Diagnosis not present

## 2020-07-28 DIAGNOSIS — R2689 Other abnormalities of gait and mobility: Secondary | ICD-10-CM | POA: Diagnosis not present

## 2020-07-28 DIAGNOSIS — N10 Acute tubulo-interstitial nephritis: Secondary | ICD-10-CM | POA: Diagnosis not present

## 2020-08-01 NOTE — Progress Notes (Signed)
Chief Complaint:   OBESITY Linda Rice is here to discuss her progress with her obesity treatment plan along with follow-up of her obesity related diagnoses.   Today's visit was #: 23 Starting weight: 213 lbs Starting date: 04/13/2019 Today's weight: 154 lbs Today's date: 07/26/2020 Total lbs lost to date: 59 lbs Body mass index is 26.43 kg/m.  Total weight loss percentage to date: 27.70%  Interim History: Linda Rice is scheduled for surgery with Urology on 09/02/2020.  She has already been cleared by Cardiology.  She says she is enjoying PT and would like for it to be reordered.  Pelvic floor dysfunction will need to be added. Nutrition Plan: practicing portion control and making smarter food choices, such as increasing vegetables and decreasing simple carbohydrates for 75-80% of the time. Activity: Walking/PT 7 times per week.  Assessment/Plan:   1. Stage 3b chronic kidney disease (HCC) Assessment: CKD stage 3 - GFR 30-59. Course: Improved since urosepsis hospitalization a few months ago.  Precautions in place.   Plan:  Upcoming cystectomy for follicular cystitis.  Lab Results  Component Value Date   CREATININE 1.46 (H) 06/16/2020   CREATININE 0.93 11/30/2019   CREATININE 0.78 08/12/2019   Lab Results  Component Value Date   CREATININE 1.46 (H) 06/16/2020   BUN 38 (H) 06/16/2020   NA 142 06/16/2020   K 4.3 06/16/2020   CL 104 06/16/2020   CO2 22 06/16/2020   2. Essential hypertension At goal. Medications: Norvasc 5 mg daily.   Plan: Avoid buying foods that are: processed, frozen, or prepackaged to avoid excess salt. We will continue to monitor closely alongside her PCP and/or Specialist.  Regular follow up with PCP and specialists was also encouraged.   BP Readings from Last 3 Encounters:  07/26/20 114/70  07/12/20 (!) 131/91  07/11/20 (!) 147/96   Lab Results  Component Value Date   CREATININE 1.46 (H) 06/16/2020   3. Chronic constipation Lakhia is followed  by GI.  Recently started low dose Linzess.  Not very helpful so far.  Discussed pelvic floor dysfunction.  She will work with PT.  Also discussed Squatty Potty.  4. GAD (generalized anxiety disorder) Improving with low dose Prozac and therapy.  She is feeling more confident.  Plan:  Behavior modification techniques were discussed today to help Onyx deal with her anxiety.  Orders and follow up as documented in patient record.   5. Class 1 obesity with serious comorbidity and body mass index (BMI) of 30.0 to 30.9 in adult, unspecified obesity type  Course: Linda Rice is currently in the action stage of change. As such, her goal is to continue with weight loss efforts.   Nutrition goals: She has agreed to practicing portion control and making smarter food choices, such as increasing vegetables and decreasing simple carbohydrates.   Exercise goals: As is. Ad lib.  Behavioral modification strategies: planning for success.  Linda Rice has agreed to follow-up with our clinic in 3-4 weeks. She was informed of the importance of frequent follow-up visits to maximize her success with intensive lifestyle modifications for her multiple health conditions.   Objective:   Blood pressure 114/70, pulse (!) 59, temperature 97.9 F (36.6 C), temperature source Oral, height 5\' 4"  (1.626 m), weight 154 lb (69.9 kg), SpO2 98 %. Body mass index is 26.43 kg/m.  General: Cooperative, alert, well developed, in no acute distress. HEENT: Conjunctivae and lids unremarkable. Cardiovascular: Regular rhythm.  Lungs: Normal work of breathing. Neurologic: No focal deficits.  Lab Results  Component Value Date   CREATININE 1.46 (H) 06/16/2020   BUN 38 (H) 06/16/2020   NA 142 06/16/2020   K 4.3 06/16/2020   CL 104 06/16/2020   CO2 22 06/16/2020   Lab Results  Component Value Date   ALT 6 06/16/2020   AST 35 06/16/2020   ALKPHOS 78 06/16/2020   BILITOT 0.2 06/16/2020   Lab Results  Component Value Date    HGBA1C 5.6 04/27/2019   Lab Results  Component Value Date   INSULIN 5.2 06/16/2020   INSULIN 3.9 11/30/2019   INSULIN 5.2 08/12/2019   INSULIN 13.3 04/27/2019   Lab Results  Component Value Date   TSH 0.814 06/16/2020   Lab Results  Component Value Date   CHOL 163 11/30/2019   HDL 60 11/30/2019   LDLCALC 88 11/30/2019   TRIG 78 11/30/2019   CHOLHDL 2.6 08/12/2019   Lab Results  Component Value Date   WBC 8.6 06/16/2020   HGB 10.3 (L) 06/16/2020   HCT 31.4 (L) 06/16/2020   MCV 90 06/16/2020   PLT 374 06/16/2020   Lab Results  Component Value Date   IRON 56 06/16/2020   TIBC 219 (L) 06/16/2020   Obesity Behavioral Intervention:   Approximately 15 minutes were spent on the discussion below.  ASK: We discussed the diagnosis of obesity with Linda Rice today and Linda Rice agreed to give [place] permission to discuss obesity behavioral modification therapy today.  ASSESS: Linda Rice has the diagnosis of obesity and her BMI today is 26.6. Linda Rice is in the action stage of change.   ADVISE: Linda Rice was educated on the multiple health risks of obesity as well as the benefit of weight loss to improve her health. She was advised of the need for long term treatment and the importance of lifestyle modifications to improve her current health and to decrease her risk of future health problems.  AGREE: Multiple dietary modification options and treatment options were discussed and Linda Rice agreed to follow the recommendations documented in the above note.  ARRANGE: Linda Rice was educated on the importance of frequent visits to treat obesity as outlined per CMS and USPSTF guidelines and agreed to schedule her next follow up appointment today.  Attestation Statements:   Reviewed by clinician on day of visit: allergies, medications, problem list, medical history, surgical history, family history, social history, and previous encounter notes.  I, Water quality scientist, CMA, am acting as transcriptionist for  [name], DO  I have reviewed the above documentation for accuracy and completeness, and I agree with the above. [name], DO

## 2020-08-02 ENCOUNTER — Encounter (INDEPENDENT_AMBULATORY_CARE_PROVIDER_SITE_OTHER): Payer: Self-pay | Admitting: Family Medicine

## 2020-08-02 DIAGNOSIS — M6281 Muscle weakness (generalized): Secondary | ICD-10-CM | POA: Diagnosis not present

## 2020-08-02 DIAGNOSIS — N10 Acute tubulo-interstitial nephritis: Secondary | ICD-10-CM | POA: Diagnosis not present

## 2020-08-02 DIAGNOSIS — R2689 Other abnormalities of gait and mobility: Secondary | ICD-10-CM | POA: Diagnosis not present

## 2020-08-03 ENCOUNTER — Ambulatory Visit (INDEPENDENT_AMBULATORY_CARE_PROVIDER_SITE_OTHER): Payer: Medicare Other | Admitting: Psychologist

## 2020-08-03 DIAGNOSIS — N3030 Trigonitis without hematuria: Secondary | ICD-10-CM | POA: Diagnosis not present

## 2020-08-03 DIAGNOSIS — F54 Psychological and behavioral factors associated with disorders or diseases classified elsewhere: Secondary | ICD-10-CM | POA: Diagnosis not present

## 2020-08-04 DIAGNOSIS — R2689 Other abnormalities of gait and mobility: Secondary | ICD-10-CM | POA: Diagnosis not present

## 2020-08-04 DIAGNOSIS — M6281 Muscle weakness (generalized): Secondary | ICD-10-CM | POA: Diagnosis not present

## 2020-08-04 DIAGNOSIS — N10 Acute tubulo-interstitial nephritis: Secondary | ICD-10-CM | POA: Diagnosis not present

## 2020-08-08 DIAGNOSIS — R2689 Other abnormalities of gait and mobility: Secondary | ICD-10-CM | POA: Diagnosis not present

## 2020-08-08 DIAGNOSIS — M6281 Muscle weakness (generalized): Secondary | ICD-10-CM | POA: Diagnosis not present

## 2020-08-08 DIAGNOSIS — N10 Acute tubulo-interstitial nephritis: Secondary | ICD-10-CM | POA: Diagnosis not present

## 2020-08-08 NOTE — Telephone Encounter (Signed)
faxed

## 2020-08-10 DIAGNOSIS — N3030 Trigonitis without hematuria: Secondary | ICD-10-CM | POA: Diagnosis not present

## 2020-08-11 DIAGNOSIS — M6281 Muscle weakness (generalized): Secondary | ICD-10-CM | POA: Diagnosis not present

## 2020-08-11 DIAGNOSIS — N10 Acute tubulo-interstitial nephritis: Secondary | ICD-10-CM | POA: Diagnosis not present

## 2020-08-11 DIAGNOSIS — R2689 Other abnormalities of gait and mobility: Secondary | ICD-10-CM | POA: Diagnosis not present

## 2020-08-15 DIAGNOSIS — N3030 Trigonitis without hematuria: Secondary | ICD-10-CM | POA: Diagnosis not present

## 2020-08-15 DIAGNOSIS — J302 Other seasonal allergic rhinitis: Secondary | ICD-10-CM | POA: Diagnosis not present

## 2020-08-17 DIAGNOSIS — R319 Hematuria, unspecified: Secondary | ICD-10-CM | POA: Diagnosis not present

## 2020-08-17 DIAGNOSIS — N3030 Trigonitis without hematuria: Secondary | ICD-10-CM | POA: Diagnosis not present

## 2020-08-17 DIAGNOSIS — M6281 Muscle weakness (generalized): Secondary | ICD-10-CM | POA: Diagnosis not present

## 2020-08-17 DIAGNOSIS — R2689 Other abnormalities of gait and mobility: Secondary | ICD-10-CM | POA: Diagnosis not present

## 2020-08-17 DIAGNOSIS — N10 Acute tubulo-interstitial nephritis: Secondary | ICD-10-CM | POA: Diagnosis not present

## 2020-08-18 DIAGNOSIS — Z01818 Encounter for other preprocedural examination: Secondary | ICD-10-CM | POA: Diagnosis not present

## 2020-08-18 DIAGNOSIS — R32 Unspecified urinary incontinence: Secondary | ICD-10-CM | POA: Diagnosis not present

## 2020-08-18 DIAGNOSIS — N39498 Other specified urinary incontinence: Secondary | ICD-10-CM | POA: Diagnosis not present

## 2020-08-18 DIAGNOSIS — N3030 Trigonitis without hematuria: Secondary | ICD-10-CM | POA: Diagnosis not present

## 2020-08-19 ENCOUNTER — Ambulatory Visit (INDEPENDENT_AMBULATORY_CARE_PROVIDER_SITE_OTHER): Payer: Medicare Other | Admitting: Psychologist

## 2020-08-19 DIAGNOSIS — M6281 Muscle weakness (generalized): Secondary | ICD-10-CM | POA: Diagnosis not present

## 2020-08-19 DIAGNOSIS — K5901 Slow transit constipation: Secondary | ICD-10-CM | POA: Diagnosis not present

## 2020-08-19 DIAGNOSIS — R2689 Other abnormalities of gait and mobility: Secondary | ICD-10-CM | POA: Diagnosis not present

## 2020-08-19 DIAGNOSIS — N10 Acute tubulo-interstitial nephritis: Secondary | ICD-10-CM | POA: Diagnosis not present

## 2020-08-19 DIAGNOSIS — F54 Psychological and behavioral factors associated with disorders or diseases classified elsewhere: Secondary | ICD-10-CM

## 2020-08-19 DIAGNOSIS — J302 Other seasonal allergic rhinitis: Secondary | ICD-10-CM | POA: Diagnosis not present

## 2020-08-19 DIAGNOSIS — N3030 Trigonitis without hematuria: Secondary | ICD-10-CM | POA: Diagnosis not present

## 2020-08-19 DIAGNOSIS — I10 Essential (primary) hypertension: Secondary | ICD-10-CM | POA: Diagnosis not present

## 2020-08-22 DIAGNOSIS — M6281 Muscle weakness (generalized): Secondary | ICD-10-CM | POA: Diagnosis not present

## 2020-08-22 DIAGNOSIS — N10 Acute tubulo-interstitial nephritis: Secondary | ICD-10-CM | POA: Diagnosis not present

## 2020-08-22 DIAGNOSIS — R2689 Other abnormalities of gait and mobility: Secondary | ICD-10-CM | POA: Diagnosis not present

## 2020-08-24 ENCOUNTER — Encounter (INDEPENDENT_AMBULATORY_CARE_PROVIDER_SITE_OTHER): Payer: Self-pay | Admitting: Family Medicine

## 2020-08-24 ENCOUNTER — Ambulatory Visit (INDEPENDENT_AMBULATORY_CARE_PROVIDER_SITE_OTHER): Payer: Medicare Other | Admitting: Family Medicine

## 2020-08-24 ENCOUNTER — Other Ambulatory Visit: Payer: Self-pay

## 2020-08-24 VITALS — BP 148/74 | HR 56 | Temp 97.4°F | Ht 64.0 in | Wt 153.0 lb

## 2020-08-24 DIAGNOSIS — N10 Acute tubulo-interstitial nephritis: Secondary | ICD-10-CM | POA: Diagnosis not present

## 2020-08-24 DIAGNOSIS — N1832 Chronic kidney disease, stage 3b: Secondary | ICD-10-CM | POA: Diagnosis not present

## 2020-08-24 DIAGNOSIS — N3030 Trigonitis without hematuria: Secondary | ICD-10-CM

## 2020-08-24 DIAGNOSIS — K5909 Other constipation: Secondary | ICD-10-CM | POA: Diagnosis not present

## 2020-08-24 DIAGNOSIS — M6281 Muscle weakness (generalized): Secondary | ICD-10-CM | POA: Diagnosis not present

## 2020-08-24 DIAGNOSIS — E669 Obesity, unspecified: Secondary | ICD-10-CM | POA: Diagnosis not present

## 2020-08-24 DIAGNOSIS — Z6830 Body mass index (BMI) 30.0-30.9, adult: Secondary | ICD-10-CM

## 2020-08-24 DIAGNOSIS — R2689 Other abnormalities of gait and mobility: Secondary | ICD-10-CM | POA: Diagnosis not present

## 2020-08-25 DIAGNOSIS — N3030 Trigonitis without hematuria: Secondary | ICD-10-CM | POA: Diagnosis not present

## 2020-08-29 DIAGNOSIS — N10 Acute tubulo-interstitial nephritis: Secondary | ICD-10-CM | POA: Diagnosis not present

## 2020-08-29 DIAGNOSIS — R2689 Other abnormalities of gait and mobility: Secondary | ICD-10-CM | POA: Diagnosis not present

## 2020-08-29 DIAGNOSIS — M6281 Muscle weakness (generalized): Secondary | ICD-10-CM | POA: Diagnosis not present

## 2020-08-30 ENCOUNTER — Other Ambulatory Visit (HOSPITAL_COMMUNITY): Admission: RE | Admit: 2020-08-30 | Payer: Medicare Other | Source: Ambulatory Visit

## 2020-08-30 ENCOUNTER — Ambulatory Visit: Payer: Medicare Other | Attending: Critical Care Medicine

## 2020-08-30 DIAGNOSIS — Z20822 Contact with and (suspected) exposure to covid-19: Secondary | ICD-10-CM | POA: Diagnosis not present

## 2020-08-31 LAB — NOVEL CORONAVIRUS, NAA: SARS-CoV-2, NAA: NOT DETECTED

## 2020-08-31 LAB — SARS-COV-2, NAA 2 DAY TAT

## 2020-08-31 NOTE — Progress Notes (Signed)
Chief Complaint:   OBESITY Linda Rice is here to discuss her progress with her obesity treatment plan along with follow-up of her obesity related diagnoses.   Today's visit was #: 24 Starting weight: 213 lbs Starting date: 04/13/2019 Today's weight: 154 lbs Today's date: 08/24/2020 Total lbs lost to date: 59 lbs Body mass index is 26.26 kg/m.  Total weight loss percentage to date: -27.70%  Interim History:  Linda Rice is scheduled for surgery next week.  Current Meal Plan: the Category 2 Plan for 99% of the time.  Current Exercise Plan: Walking/PT for 99% of the time.  Assessment/Plan:   1. Chronic constipation Linda Rice is followed by GI.  Taking Linzess 72 mcg daily.  Discussed pelvic floor dysfunction.  She will work with PT.    2. Stage 3b chronic kidney disease ([place]) Assessment: CKD stage 3 - GFR 30-59. Course: Improved since urosepsis hospitalization a few months ago.  Precautions in place.    Lab Results  Component Value Date   CREATININE 1.46 (H) 06/16/2020   CREATININE 0.93 11/30/2019   CREATININE 0.78 08/12/2019   Lab Results  Component Value Date   CREATININE 1.46 (H) 06/16/2020   BUN 38 (H) 06/16/2020   NA 142 06/16/2020   K 4.3 06/16/2020   CL 104 06/16/2020   CO2 22 06/16/2020   3. Follicular cystitis Linda Rice is scheduled for upcoming cystectomy.   4. Class 1 obesity with serious comorbidity and body mass index (BMI) of 30.0 to 30.9 in adult, unspecified obesity type  Course: Linda Rice is currently in the action stage of change. As such, her goal is to continue with weight loss efforts.   Nutrition goals: She has agreed to practicing portion control and making smarter food choices, such as increasing vegetables and decreasing simple carbohydrates.   Exercise goals: Continue PT.  Behavioral modification strategies: increasing lean protein intake and increasing high fiber foods.  Linda Rice has agreed to follow-up with our clinic after surgery.  She will  call. She was informed of the importance of frequent follow-up visits to maximize her success with intensive lifestyle modifications for her multiple health conditions.   Objective:   Blood pressure (!) 148/74, pulse (!) 56, temperature (!) 97.4 F (36.3 C), temperature source Oral, height 5\' 4"  (1.626 m), weight 153 lb (69.4 kg), SpO2 98 %. Body mass index is 26.26 kg/m.  General: Cooperative, alert, well developed, in no acute distress. HEENT: Conjunctivae and lids unremarkable. Cardiovascular: Regular rhythm.  Lungs: Normal work of breathing. Neurologic: No focal deficits.   Lab Results  Component Value Date   CREATININE 1.46 (H) 06/16/2020   BUN 38 (H) 06/16/2020   NA 142 06/16/2020   K 4.3 06/16/2020   CL 104 06/16/2020   CO2 22 06/16/2020   Lab Results  Component Value Date   ALT 6 06/16/2020   AST 35 06/16/2020   ALKPHOS 78 06/16/2020   BILITOT 0.2 06/16/2020   Lab Results  Component Value Date   HGBA1C 5.6 04/27/2019   Lab Results  Component Value Date   INSULIN 5.2 06/16/2020   INSULIN 3.9 11/30/2019   INSULIN 5.2 08/12/2019   INSULIN 13.3 04/27/2019   Lab Results  Component Value Date   TSH 0.814 06/16/2020   Lab Results  Component Value Date   CHOL 163 11/30/2019   HDL 60 11/30/2019   LDLCALC 88 11/30/2019   TRIG 78 11/30/2019   CHOLHDL 2.6 08/12/2019   Lab Results  Component Value Date   WBC  8.6 06/16/2020   HGB 10.3 (L) 06/16/2020   HCT 31.4 (L) 06/16/2020   MCV 90 06/16/2020   PLT 374 06/16/2020   Lab Results  Component Value Date   IRON 56 06/16/2020   TIBC 219 (L) 06/16/2020   Obesity Behavioral Intervention:   Approximately 15 minutes were spent on the discussion below.  ASK: We discussed the diagnosis of obesity with Linda Rice today and Linda Rice agreed to give [place] permission to discuss obesity behavioral modification therapy today.  ASSESS: Linda Rice has the diagnosis of obesity and her BMI today is 26.4. Linda Rice is in the action  stage of change.   ADVISE: Linda Rice was educated on the multiple health risks of obesity as well as the benefit of weight loss to improve her health. She was advised of the need for long term treatment and the importance of lifestyle modifications to improve her current health and to decrease her risk of future health problems.  AGREE: Multiple dietary modification options and treatment options were discussed and [name] agreed to follow the recommendations documented in the above note.  ARRANGE: Linda Rice was educated on the importance of frequent visits to treat obesity as outlined per CMS and USPSTF guidelines and agreed to schedule her next follow up appointment today.  Attestation Statements:   Reviewed by clinician on day of visit: allergies, medications, problem list, medical history, surgical history, family history, social history, and previous encounter notes.  I, Water quality scientist, CMA, am acting as transcriptionist for [name], DO  I have reviewed the above documentation for accuracy and completeness, and I agree with the above. [name], DO

## 2020-09-02 DIAGNOSIS — Z436 Encounter for attention to other artificial openings of urinary tract: Secondary | ICD-10-CM | POA: Diagnosis not present

## 2020-09-02 DIAGNOSIS — N816 Rectocele: Secondary | ICD-10-CM | POA: Diagnosis present

## 2020-09-02 DIAGNOSIS — I2510 Atherosclerotic heart disease of native coronary artery without angina pectoris: Secondary | ICD-10-CM | POA: Diagnosis present

## 2020-09-02 DIAGNOSIS — Z79899 Other long term (current) drug therapy: Secondary | ICD-10-CM | POA: Diagnosis not present

## 2020-09-02 DIAGNOSIS — N318 Other neuromuscular dysfunction of bladder: Secondary | ICD-10-CM | POA: Diagnosis not present

## 2020-09-02 DIAGNOSIS — D62 Acute posthemorrhagic anemia: Secondary | ICD-10-CM | POA: Diagnosis present

## 2020-09-02 DIAGNOSIS — N319 Neuromuscular dysfunction of bladder, unspecified: Secondary | ICD-10-CM | POA: Diagnosis not present

## 2020-09-02 DIAGNOSIS — K5900 Constipation, unspecified: Secondary | ICD-10-CM | POA: Diagnosis not present

## 2020-09-02 DIAGNOSIS — I10 Essential (primary) hypertension: Secondary | ICD-10-CM | POA: Diagnosis present

## 2020-09-02 DIAGNOSIS — E039 Hypothyroidism, unspecified: Secondary | ICD-10-CM | POA: Diagnosis not present

## 2020-09-02 DIAGNOSIS — N8110 Cystocele, unspecified: Secondary | ICD-10-CM | POA: Diagnosis present

## 2020-09-02 DIAGNOSIS — Z5331 Laparoscopic surgical procedure converted to open procedure: Secondary | ICD-10-CM | POA: Diagnosis not present

## 2020-09-02 DIAGNOSIS — J45909 Unspecified asthma, uncomplicated: Secondary | ICD-10-CM | POA: Diagnosis not present

## 2020-09-02 DIAGNOSIS — Z20822 Contact with and (suspected) exposure to covid-19: Secondary | ICD-10-CM | POA: Diagnosis present

## 2020-09-02 DIAGNOSIS — Z483 Aftercare following surgery for neoplasm: Secondary | ICD-10-CM | POA: Diagnosis not present

## 2020-09-02 DIAGNOSIS — E785 Hyperlipidemia, unspecified: Secondary | ICD-10-CM | POA: Diagnosis present

## 2020-09-02 DIAGNOSIS — N323 Diverticulum of bladder: Secondary | ICD-10-CM | POA: Diagnosis present

## 2020-09-02 DIAGNOSIS — M8580 Other specified disorders of bone density and structure, unspecified site: Secondary | ICD-10-CM | POA: Diagnosis not present

## 2020-09-02 DIAGNOSIS — C679 Malignant neoplasm of bladder, unspecified: Secondary | ICD-10-CM | POA: Diagnosis not present

## 2020-09-02 DIAGNOSIS — N3289 Other specified disorders of bladder: Secondary | ICD-10-CM | POA: Diagnosis present

## 2020-09-02 DIAGNOSIS — N3020 Other chronic cystitis without hematuria: Secondary | ICD-10-CM | POA: Diagnosis present

## 2020-09-02 DIAGNOSIS — Z96642 Presence of left artificial hip joint: Secondary | ICD-10-CM | POA: Diagnosis not present

## 2020-09-02 DIAGNOSIS — R32 Unspecified urinary incontinence: Secondary | ICD-10-CM | POA: Diagnosis not present

## 2020-09-06 DIAGNOSIS — Z483 Aftercare following surgery for neoplasm: Secondary | ICD-10-CM | POA: Diagnosis not present

## 2020-09-06 DIAGNOSIS — K219 Gastro-esophageal reflux disease without esophagitis: Secondary | ICD-10-CM | POA: Diagnosis not present

## 2020-09-06 DIAGNOSIS — D509 Iron deficiency anemia, unspecified: Secondary | ICD-10-CM | POA: Diagnosis not present

## 2020-09-06 DIAGNOSIS — M8580 Other specified disorders of bone density and structure, unspecified site: Secondary | ICD-10-CM | POA: Diagnosis not present

## 2020-09-06 DIAGNOSIS — N3090 Cystitis, unspecified without hematuria: Secondary | ICD-10-CM | POA: Diagnosis not present

## 2020-09-06 DIAGNOSIS — K5900 Constipation, unspecified: Secondary | ICD-10-CM | POA: Diagnosis not present

## 2020-09-06 DIAGNOSIS — E785 Hyperlipidemia, unspecified: Secondary | ICD-10-CM | POA: Diagnosis not present

## 2020-09-06 DIAGNOSIS — J45909 Unspecified asthma, uncomplicated: Secondary | ICD-10-CM | POA: Diagnosis not present

## 2020-09-06 DIAGNOSIS — N3946 Mixed incontinence: Secondary | ICD-10-CM | POA: Diagnosis not present

## 2020-09-06 DIAGNOSIS — E039 Hypothyroidism, unspecified: Secondary | ICD-10-CM | POA: Diagnosis not present

## 2020-09-06 DIAGNOSIS — R531 Weakness: Secondary | ICD-10-CM | POA: Diagnosis not present

## 2020-09-06 DIAGNOSIS — C679 Malignant neoplasm of bladder, unspecified: Secondary | ICD-10-CM | POA: Diagnosis not present

## 2020-09-06 DIAGNOSIS — Z436 Encounter for attention to other artificial openings of urinary tract: Secondary | ICD-10-CM | POA: Diagnosis not present

## 2020-09-06 DIAGNOSIS — I10 Essential (primary) hypertension: Secondary | ICD-10-CM | POA: Diagnosis not present

## 2020-09-06 DIAGNOSIS — Z906 Acquired absence of other parts of urinary tract: Secondary | ICD-10-CM | POA: Diagnosis not present

## 2020-09-07 ENCOUNTER — Encounter (INDEPENDENT_AMBULATORY_CARE_PROVIDER_SITE_OTHER): Payer: Self-pay | Admitting: Family Medicine

## 2020-09-09 DIAGNOSIS — N3946 Mixed incontinence: Secondary | ICD-10-CM | POA: Diagnosis not present

## 2020-09-09 DIAGNOSIS — K219 Gastro-esophageal reflux disease without esophagitis: Secondary | ICD-10-CM | POA: Diagnosis not present

## 2020-09-09 DIAGNOSIS — Z906 Acquired absence of other parts of urinary tract: Secondary | ICD-10-CM | POA: Diagnosis not present

## 2020-09-09 DIAGNOSIS — R531 Weakness: Secondary | ICD-10-CM | POA: Diagnosis not present

## 2020-09-09 DIAGNOSIS — N3090 Cystitis, unspecified without hematuria: Secondary | ICD-10-CM | POA: Diagnosis not present

## 2020-09-09 DIAGNOSIS — I10 Essential (primary) hypertension: Secondary | ICD-10-CM | POA: Diagnosis not present

## 2020-09-09 DIAGNOSIS — D509 Iron deficiency anemia, unspecified: Secondary | ICD-10-CM | POA: Diagnosis not present

## 2020-09-12 ENCOUNTER — Ambulatory Visit (INDEPENDENT_AMBULATORY_CARE_PROVIDER_SITE_OTHER): Payer: Medicare Other | Admitting: Psychologist

## 2020-09-12 DIAGNOSIS — F54 Psychological and behavioral factors associated with disorders or diseases classified elsewhere: Secondary | ICD-10-CM | POA: Diagnosis not present

## 2020-09-12 DIAGNOSIS — R2689 Other abnormalities of gait and mobility: Secondary | ICD-10-CM | POA: Diagnosis not present

## 2020-09-12 DIAGNOSIS — M6281 Muscle weakness (generalized): Secondary | ICD-10-CM | POA: Diagnosis not present

## 2020-09-14 ENCOUNTER — Ambulatory Visit: Payer: Medicare Other | Admitting: Psychologist

## 2020-09-14 DIAGNOSIS — R32 Unspecified urinary incontinence: Secondary | ICD-10-CM | POA: Diagnosis not present

## 2020-09-14 DIAGNOSIS — N390 Urinary tract infection, site not specified: Secondary | ICD-10-CM | POA: Diagnosis not present

## 2020-09-14 DIAGNOSIS — Z09 Encounter for follow-up examination after completed treatment for conditions other than malignant neoplasm: Secondary | ICD-10-CM | POA: Diagnosis not present

## 2020-09-14 DIAGNOSIS — N3030 Trigonitis without hematuria: Secondary | ICD-10-CM | POA: Diagnosis not present

## 2020-09-15 DIAGNOSIS — R2689 Other abnormalities of gait and mobility: Secondary | ICD-10-CM | POA: Diagnosis not present

## 2020-09-15 DIAGNOSIS — M6281 Muscle weakness (generalized): Secondary | ICD-10-CM | POA: Diagnosis not present

## 2020-09-16 DIAGNOSIS — M6281 Muscle weakness (generalized): Secondary | ICD-10-CM | POA: Diagnosis not present

## 2020-09-16 DIAGNOSIS — R2689 Other abnormalities of gait and mobility: Secondary | ICD-10-CM | POA: Diagnosis not present

## 2020-09-19 DIAGNOSIS — R2689 Other abnormalities of gait and mobility: Secondary | ICD-10-CM | POA: Diagnosis not present

## 2020-09-19 DIAGNOSIS — M6281 Muscle weakness (generalized): Secondary | ICD-10-CM | POA: Diagnosis not present

## 2020-09-20 DIAGNOSIS — R2689 Other abnormalities of gait and mobility: Secondary | ICD-10-CM | POA: Diagnosis not present

## 2020-09-20 DIAGNOSIS — M6281 Muscle weakness (generalized): Secondary | ICD-10-CM | POA: Diagnosis not present

## 2020-09-22 DIAGNOSIS — M6281 Muscle weakness (generalized): Secondary | ICD-10-CM | POA: Diagnosis not present

## 2020-09-22 DIAGNOSIS — R2689 Other abnormalities of gait and mobility: Secondary | ICD-10-CM | POA: Diagnosis not present

## 2020-09-26 ENCOUNTER — Ambulatory Visit (INDEPENDENT_AMBULATORY_CARE_PROVIDER_SITE_OTHER): Payer: Medicare Other | Admitting: Psychologist

## 2020-09-26 DIAGNOSIS — F54 Psychological and behavioral factors associated with disorders or diseases classified elsewhere: Secondary | ICD-10-CM

## 2020-09-27 DIAGNOSIS — M6281 Muscle weakness (generalized): Secondary | ICD-10-CM | POA: Diagnosis not present

## 2020-09-27 DIAGNOSIS — R2689 Other abnormalities of gait and mobility: Secondary | ICD-10-CM | POA: Diagnosis not present

## 2020-09-28 DIAGNOSIS — Z23 Encounter for immunization: Secondary | ICD-10-CM | POA: Diagnosis not present

## 2020-09-29 DIAGNOSIS — M6281 Muscle weakness (generalized): Secondary | ICD-10-CM | POA: Diagnosis not present

## 2020-09-29 DIAGNOSIS — R2689 Other abnormalities of gait and mobility: Secondary | ICD-10-CM | POA: Diagnosis not present

## 2020-09-30 DIAGNOSIS — Z09 Encounter for follow-up examination after completed treatment for conditions other than malignant neoplasm: Secondary | ICD-10-CM | POA: Diagnosis not present

## 2020-09-30 DIAGNOSIS — Z9889 Other specified postprocedural states: Secondary | ICD-10-CM | POA: Diagnosis not present

## 2020-10-03 DIAGNOSIS — R2689 Other abnormalities of gait and mobility: Secondary | ICD-10-CM | POA: Diagnosis not present

## 2020-10-03 DIAGNOSIS — M6281 Muscle weakness (generalized): Secondary | ICD-10-CM | POA: Diagnosis not present

## 2020-10-04 ENCOUNTER — Encounter (INDEPENDENT_AMBULATORY_CARE_PROVIDER_SITE_OTHER): Payer: Self-pay | Admitting: Family Medicine

## 2020-10-06 DIAGNOSIS — R2689 Other abnormalities of gait and mobility: Secondary | ICD-10-CM | POA: Diagnosis not present

## 2020-10-06 DIAGNOSIS — M6281 Muscle weakness (generalized): Secondary | ICD-10-CM | POA: Diagnosis not present

## 2020-10-07 DIAGNOSIS — R630 Anorexia: Secondary | ICD-10-CM | POA: Diagnosis not present

## 2020-10-10 DIAGNOSIS — R2689 Other abnormalities of gait and mobility: Secondary | ICD-10-CM | POA: Diagnosis not present

## 2020-10-10 DIAGNOSIS — M6281 Muscle weakness (generalized): Secondary | ICD-10-CM | POA: Diagnosis not present

## 2020-10-13 ENCOUNTER — Other Ambulatory Visit (INDEPENDENT_AMBULATORY_CARE_PROVIDER_SITE_OTHER): Payer: Self-pay | Admitting: Family Medicine

## 2020-10-13 DIAGNOSIS — F411 Generalized anxiety disorder: Secondary | ICD-10-CM

## 2020-10-13 NOTE — Telephone Encounter (Signed)
Dr.Wallace °

## 2020-10-18 DIAGNOSIS — R2689 Other abnormalities of gait and mobility: Secondary | ICD-10-CM | POA: Diagnosis not present

## 2020-10-18 DIAGNOSIS — M6281 Muscle weakness (generalized): Secondary | ICD-10-CM | POA: Diagnosis not present

## 2020-11-03 DIAGNOSIS — M6281 Muscle weakness (generalized): Secondary | ICD-10-CM | POA: Diagnosis not present

## 2020-11-03 DIAGNOSIS — R2689 Other abnormalities of gait and mobility: Secondary | ICD-10-CM | POA: Diagnosis not present

## 2020-11-04 DIAGNOSIS — K219 Gastro-esophageal reflux disease without esophagitis: Secondary | ICD-10-CM | POA: Diagnosis not present

## 2020-11-04 DIAGNOSIS — R1013 Epigastric pain: Secondary | ICD-10-CM | POA: Diagnosis not present

## 2020-11-04 DIAGNOSIS — K5900 Constipation, unspecified: Secondary | ICD-10-CM | POA: Diagnosis not present

## 2020-11-04 DIAGNOSIS — R634 Abnormal weight loss: Secondary | ICD-10-CM | POA: Diagnosis not present

## 2020-11-07 DIAGNOSIS — R2689 Other abnormalities of gait and mobility: Secondary | ICD-10-CM | POA: Diagnosis not present

## 2020-11-07 DIAGNOSIS — M6281 Muscle weakness (generalized): Secondary | ICD-10-CM | POA: Diagnosis not present

## 2020-11-09 DIAGNOSIS — D649 Anemia, unspecified: Secondary | ICD-10-CM | POA: Diagnosis not present

## 2020-11-09 DIAGNOSIS — N1831 Chronic kidney disease, stage 3a: Secondary | ICD-10-CM | POA: Diagnosis not present

## 2020-11-16 DIAGNOSIS — R2689 Other abnormalities of gait and mobility: Secondary | ICD-10-CM | POA: Diagnosis not present

## 2020-11-16 DIAGNOSIS — M6281 Muscle weakness (generalized): Secondary | ICD-10-CM | POA: Diagnosis not present

## 2020-11-18 DIAGNOSIS — R2689 Other abnormalities of gait and mobility: Secondary | ICD-10-CM | POA: Diagnosis not present

## 2020-11-18 DIAGNOSIS — M6281 Muscle weakness (generalized): Secondary | ICD-10-CM | POA: Diagnosis not present

## 2020-11-21 DIAGNOSIS — R2689 Other abnormalities of gait and mobility: Secondary | ICD-10-CM | POA: Diagnosis not present

## 2020-11-21 DIAGNOSIS — M6281 Muscle weakness (generalized): Secondary | ICD-10-CM | POA: Diagnosis not present

## 2020-11-30 ENCOUNTER — Other Ambulatory Visit: Payer: Self-pay | Admitting: Physician Assistant

## 2020-11-30 DIAGNOSIS — K219 Gastro-esophageal reflux disease without esophagitis: Secondary | ICD-10-CM

## 2020-11-30 DIAGNOSIS — R6881 Early satiety: Secondary | ICD-10-CM | POA: Diagnosis not present

## 2020-11-30 DIAGNOSIS — D509 Iron deficiency anemia, unspecified: Secondary | ICD-10-CM | POA: Diagnosis not present

## 2020-11-30 DIAGNOSIS — N1831 Chronic kidney disease, stage 3a: Secondary | ICD-10-CM | POA: Diagnosis not present

## 2020-11-30 DIAGNOSIS — R634 Abnormal weight loss: Secondary | ICD-10-CM | POA: Diagnosis not present

## 2020-11-30 DIAGNOSIS — K5900 Constipation, unspecified: Secondary | ICD-10-CM | POA: Diagnosis not present

## 2020-11-30 DIAGNOSIS — R630 Anorexia: Secondary | ICD-10-CM

## 2020-12-01 ENCOUNTER — Other Ambulatory Visit: Payer: Medicare Other

## 2020-12-01 ENCOUNTER — Ambulatory Visit
Admission: RE | Admit: 2020-12-01 | Discharge: 2020-12-01 | Disposition: A | Discharge status: Home / Self Care | Payer: Medicare Other | Source: Ambulatory Visit | Attending: Physician Assistant | Admitting: Physician Assistant

## 2020-12-01 DIAGNOSIS — R1084 Generalized abdominal pain: Secondary | ICD-10-CM | POA: Diagnosis not present

## 2020-12-01 DIAGNOSIS — R10816 Epigastric abdominal tenderness: Secondary | ICD-10-CM | POA: Diagnosis not present

## 2020-12-01 DIAGNOSIS — R630 Anorexia: Secondary | ICD-10-CM | POA: Diagnosis not present

## 2020-12-01 DIAGNOSIS — K219 Gastro-esophageal reflux disease without esophagitis: Secondary | ICD-10-CM

## 2020-12-08 DIAGNOSIS — J4520 Mild intermittent asthma, uncomplicated: Secondary | ICD-10-CM | POA: Diagnosis not present

## 2020-12-08 DIAGNOSIS — E7800 Pure hypercholesterolemia, unspecified: Secondary | ICD-10-CM | POA: Diagnosis not present

## 2020-12-08 DIAGNOSIS — I10 Essential (primary) hypertension: Secondary | ICD-10-CM | POA: Diagnosis not present

## 2020-12-08 DIAGNOSIS — F329 Major depressive disorder, single episode, unspecified: Secondary | ICD-10-CM | POA: Diagnosis not present

## 2020-12-08 DIAGNOSIS — I480 Paroxysmal atrial fibrillation: Secondary | ICD-10-CM | POA: Diagnosis not present

## 2020-12-08 DIAGNOSIS — D649 Anemia, unspecified: Secondary | ICD-10-CM | POA: Diagnosis not present

## 2020-12-08 DIAGNOSIS — N1831 Chronic kidney disease, stage 3a: Secondary | ICD-10-CM | POA: Diagnosis not present

## 2020-12-08 DIAGNOSIS — D509 Iron deficiency anemia, unspecified: Secondary | ICD-10-CM | POA: Diagnosis not present

## 2020-12-08 DIAGNOSIS — E039 Hypothyroidism, unspecified: Secondary | ICD-10-CM | POA: Diagnosis not present

## 2020-12-08 DIAGNOSIS — K219 Gastro-esophageal reflux disease without esophagitis: Secondary | ICD-10-CM | POA: Diagnosis not present

## 2020-12-08 DIAGNOSIS — M8580 Other specified disorders of bone density and structure, unspecified site: Secondary | ICD-10-CM | POA: Diagnosis not present

## 2020-12-12 DIAGNOSIS — R2689 Other abnormalities of gait and mobility: Secondary | ICD-10-CM | POA: Diagnosis not present

## 2020-12-12 DIAGNOSIS — M6281 Muscle weakness (generalized): Secondary | ICD-10-CM | POA: Diagnosis not present

## 2020-12-14 DIAGNOSIS — R2689 Other abnormalities of gait and mobility: Secondary | ICD-10-CM | POA: Diagnosis not present

## 2020-12-14 DIAGNOSIS — D649 Anemia, unspecified: Secondary | ICD-10-CM | POA: Diagnosis not present

## 2020-12-14 DIAGNOSIS — M6281 Muscle weakness (generalized): Secondary | ICD-10-CM | POA: Diagnosis not present

## 2020-12-21 DIAGNOSIS — R2689 Other abnormalities of gait and mobility: Secondary | ICD-10-CM | POA: Diagnosis not present

## 2020-12-21 DIAGNOSIS — M6281 Muscle weakness (generalized): Secondary | ICD-10-CM | POA: Diagnosis not present

## 2020-12-23 DIAGNOSIS — M6281 Muscle weakness (generalized): Secondary | ICD-10-CM | POA: Diagnosis not present

## 2020-12-23 DIAGNOSIS — R2689 Other abnormalities of gait and mobility: Secondary | ICD-10-CM | POA: Diagnosis not present

## 2021-01-09 ENCOUNTER — Other Ambulatory Visit: Payer: Self-pay | Admitting: Physician Assistant

## 2021-01-09 DIAGNOSIS — R634 Abnormal weight loss: Secondary | ICD-10-CM

## 2021-01-09 DIAGNOSIS — N1831 Chronic kidney disease, stage 3a: Secondary | ICD-10-CM | POA: Diagnosis not present

## 2021-01-09 DIAGNOSIS — R6881 Early satiety: Secondary | ICD-10-CM | POA: Diagnosis not present

## 2021-01-09 DIAGNOSIS — D509 Iron deficiency anemia, unspecified: Secondary | ICD-10-CM | POA: Diagnosis not present

## 2021-01-09 DIAGNOSIS — K5900 Constipation, unspecified: Secondary | ICD-10-CM | POA: Diagnosis not present

## 2021-01-25 DIAGNOSIS — E039 Hypothyroidism, unspecified: Secondary | ICD-10-CM | POA: Diagnosis not present

## 2021-01-25 DIAGNOSIS — D509 Iron deficiency anemia, unspecified: Secondary | ICD-10-CM | POA: Diagnosis not present

## 2021-01-25 DIAGNOSIS — F329 Major depressive disorder, single episode, unspecified: Secondary | ICD-10-CM | POA: Diagnosis not present

## 2021-01-25 DIAGNOSIS — N1831 Chronic kidney disease, stage 3a: Secondary | ICD-10-CM | POA: Diagnosis not present

## 2021-01-25 DIAGNOSIS — M8580 Other specified disorders of bone density and structure, unspecified site: Secondary | ICD-10-CM | POA: Diagnosis not present

## 2021-01-25 DIAGNOSIS — E7800 Pure hypercholesterolemia, unspecified: Secondary | ICD-10-CM | POA: Diagnosis not present

## 2021-01-25 DIAGNOSIS — I480 Paroxysmal atrial fibrillation: Secondary | ICD-10-CM | POA: Diagnosis not present

## 2021-01-25 DIAGNOSIS — K219 Gastro-esophageal reflux disease without esophagitis: Secondary | ICD-10-CM | POA: Diagnosis not present

## 2021-01-25 DIAGNOSIS — I10 Essential (primary) hypertension: Secondary | ICD-10-CM | POA: Diagnosis not present

## 2021-01-25 DIAGNOSIS — J4520 Mild intermittent asthma, uncomplicated: Secondary | ICD-10-CM | POA: Diagnosis not present

## 2021-01-26 DIAGNOSIS — R634 Abnormal weight loss: Secondary | ICD-10-CM | POA: Diagnosis not present

## 2021-01-27 ENCOUNTER — Ambulatory Visit
Admission: RE | Admit: 2021-01-27 | Discharge: 2021-01-27 | Disposition: A | Discharge status: Home / Self Care | Payer: Medicare Other | Source: Ambulatory Visit | Attending: Physician Assistant | Admitting: Physician Assistant

## 2021-01-27 DIAGNOSIS — K5730 Diverticulosis of large intestine without perforation or abscess without bleeding: Secondary | ICD-10-CM | POA: Diagnosis not present

## 2021-01-27 DIAGNOSIS — R634 Abnormal weight loss: Secondary | ICD-10-CM | POA: Diagnosis not present

## 2021-01-27 DIAGNOSIS — N1330 Unspecified hydronephrosis: Secondary | ICD-10-CM | POA: Diagnosis not present

## 2021-02-02 DIAGNOSIS — R634 Abnormal weight loss: Secondary | ICD-10-CM | POA: Diagnosis not present

## 2021-02-02 DIAGNOSIS — R630 Anorexia: Secondary | ICD-10-CM | POA: Diagnosis not present

## 2021-02-02 DIAGNOSIS — R933 Abnormal findings on diagnostic imaging of other parts of digestive tract: Secondary | ICD-10-CM | POA: Diagnosis not present

## 2021-02-02 DIAGNOSIS — K2930 Chronic superficial gastritis without bleeding: Secondary | ICD-10-CM | POA: Diagnosis not present

## 2021-02-02 DIAGNOSIS — K449 Diaphragmatic hernia without obstruction or gangrene: Secondary | ICD-10-CM | POA: Diagnosis not present

## 2021-02-03 DIAGNOSIS — E440 Moderate protein-calorie malnutrition: Secondary | ICD-10-CM | POA: Diagnosis not present

## 2021-02-03 DIAGNOSIS — N3080 Other cystitis without hematuria: Secondary | ICD-10-CM | POA: Diagnosis present

## 2021-02-03 DIAGNOSIS — R110 Nausea: Secondary | ICD-10-CM | POA: Diagnosis not present

## 2021-02-03 DIAGNOSIS — N390 Urinary tract infection, site not specified: Secondary | ICD-10-CM | POA: Diagnosis not present

## 2021-02-03 DIAGNOSIS — D72829 Elevated white blood cell count, unspecified: Secondary | ICD-10-CM | POA: Diagnosis present

## 2021-02-03 DIAGNOSIS — N178 Other acute kidney failure: Secondary | ICD-10-CM | POA: Diagnosis not present

## 2021-02-03 DIAGNOSIS — G4700 Insomnia, unspecified: Secondary | ICD-10-CM | POA: Diagnosis present

## 2021-02-03 DIAGNOSIS — Z20822 Contact with and (suspected) exposure to covid-19: Secondary | ICD-10-CM | POA: Diagnosis not present

## 2021-02-03 DIAGNOSIS — F32A Depression, unspecified: Secondary | ICD-10-CM | POA: Diagnosis present

## 2021-02-03 DIAGNOSIS — K2930 Chronic superficial gastritis without bleeding: Secondary | ICD-10-CM | POA: Diagnosis not present

## 2021-02-03 DIAGNOSIS — Z96653 Presence of artificial knee joint, bilateral: Secondary | ICD-10-CM | POA: Diagnosis not present

## 2021-02-03 DIAGNOSIS — M8580 Other specified disorders of bone density and structure, unspecified site: Secondary | ICD-10-CM | POA: Diagnosis present

## 2021-02-03 DIAGNOSIS — R5383 Other fatigue: Secondary | ICD-10-CM | POA: Diagnosis not present

## 2021-02-03 DIAGNOSIS — N179 Acute kidney failure, unspecified: Secondary | ICD-10-CM | POA: Diagnosis present

## 2021-02-03 DIAGNOSIS — L659 Nonscarring hair loss, unspecified: Secondary | ICD-10-CM | POA: Diagnosis not present

## 2021-02-03 DIAGNOSIS — R627 Adult failure to thrive: Secondary | ICD-10-CM | POA: Diagnosis not present

## 2021-02-03 DIAGNOSIS — K5909 Other constipation: Secondary | ICD-10-CM | POA: Diagnosis present

## 2021-02-03 DIAGNOSIS — E43 Unspecified severe protein-calorie malnutrition: Secondary | ICD-10-CM | POA: Diagnosis present

## 2021-02-03 DIAGNOSIS — N323 Diverticulum of bladder: Secondary | ICD-10-CM | POA: Diagnosis not present

## 2021-02-03 DIAGNOSIS — E785 Hyperlipidemia, unspecified: Secondary | ICD-10-CM | POA: Diagnosis present

## 2021-02-03 DIAGNOSIS — N189 Chronic kidney disease, unspecified: Secondary | ICD-10-CM | POA: Diagnosis present

## 2021-02-03 DIAGNOSIS — R7989 Other specified abnormal findings of blood chemistry: Secondary | ICD-10-CM | POA: Diagnosis not present

## 2021-02-03 DIAGNOSIS — R319 Hematuria, unspecified: Secondary | ICD-10-CM | POA: Diagnosis not present

## 2021-02-03 DIAGNOSIS — R945 Abnormal results of liver function studies: Secondary | ICD-10-CM | POA: Diagnosis not present

## 2021-02-03 DIAGNOSIS — R5381 Other malaise: Secondary | ICD-10-CM | POA: Diagnosis present

## 2021-02-03 DIAGNOSIS — E559 Vitamin D deficiency, unspecified: Secondary | ICD-10-CM | POA: Diagnosis not present

## 2021-02-03 DIAGNOSIS — E860 Dehydration: Secondary | ICD-10-CM | POA: Diagnosis not present

## 2021-02-03 DIAGNOSIS — R634 Abnormal weight loss: Secondary | ICD-10-CM | POA: Diagnosis not present

## 2021-02-03 DIAGNOSIS — Z936 Other artificial openings of urinary tract status: Secondary | ICD-10-CM | POA: Diagnosis not present

## 2021-02-03 DIAGNOSIS — I498 Other specified cardiac arrhythmias: Secondary | ICD-10-CM | POA: Diagnosis present

## 2021-02-03 DIAGNOSIS — I10 Essential (primary) hypertension: Secondary | ICD-10-CM | POA: Diagnosis not present

## 2021-02-03 DIAGNOSIS — Z96642 Presence of left artificial hip joint: Secondary | ICD-10-CM | POA: Diagnosis not present

## 2021-02-03 DIAGNOSIS — N3030 Trigonitis without hematuria: Secondary | ICD-10-CM | POA: Diagnosis present

## 2021-02-03 DIAGNOSIS — N1330 Unspecified hydronephrosis: Secondary | ICD-10-CM | POA: Diagnosis present

## 2021-02-03 DIAGNOSIS — R590 Localized enlarged lymph nodes: Secondary | ICD-10-CM | POA: Diagnosis not present

## 2021-02-03 DIAGNOSIS — N1339 Other hydronephrosis: Secondary | ICD-10-CM | POA: Diagnosis not present

## 2021-02-03 DIAGNOSIS — E872 Acidosis: Secondary | ICD-10-CM | POA: Diagnosis present

## 2021-02-03 DIAGNOSIS — Z87440 Personal history of urinary (tract) infections: Secondary | ICD-10-CM | POA: Diagnosis not present

## 2021-02-03 DIAGNOSIS — E039 Hypothyroidism, unspecified: Secondary | ICD-10-CM | POA: Diagnosis present

## 2021-02-03 DIAGNOSIS — R42 Dizziness and giddiness: Secondary | ICD-10-CM | POA: Diagnosis not present

## 2021-02-03 DIAGNOSIS — E876 Hypokalemia: Secondary | ICD-10-CM | POA: Diagnosis present

## 2021-02-03 DIAGNOSIS — R0600 Dyspnea, unspecified: Secondary | ICD-10-CM | POA: Diagnosis not present

## 2021-02-03 DIAGNOSIS — J45909 Unspecified asthma, uncomplicated: Secondary | ICD-10-CM | POA: Diagnosis present

## 2021-02-03 DIAGNOSIS — M8668 Other chronic osteomyelitis, other site: Secondary | ICD-10-CM | POA: Diagnosis present

## 2021-02-03 DIAGNOSIS — D649 Anemia, unspecified: Secondary | ICD-10-CM | POA: Diagnosis not present

## 2021-02-03 DIAGNOSIS — K5904 Chronic idiopathic constipation: Secondary | ICD-10-CM | POA: Diagnosis not present

## 2021-02-03 DIAGNOSIS — I129 Hypertensive chronic kidney disease with stage 1 through stage 4 chronic kidney disease, or unspecified chronic kidney disease: Secondary | ICD-10-CM | POA: Diagnosis present

## 2021-02-03 DIAGNOSIS — K219 Gastro-esophageal reflux disease without esophagitis: Secondary | ICD-10-CM | POA: Diagnosis present

## 2021-02-07 DIAGNOSIS — K2930 Chronic superficial gastritis without bleeding: Secondary | ICD-10-CM | POA: Diagnosis not present

## 2021-02-09 DIAGNOSIS — E559 Vitamin D deficiency, unspecified: Secondary | ICD-10-CM | POA: Diagnosis not present

## 2021-02-09 DIAGNOSIS — E039 Hypothyroidism, unspecified: Secondary | ICD-10-CM | POA: Diagnosis not present

## 2021-02-09 DIAGNOSIS — N189 Chronic kidney disease, unspecified: Secondary | ICD-10-CM | POA: Diagnosis not present

## 2021-02-09 DIAGNOSIS — E785 Hyperlipidemia, unspecified: Secondary | ICD-10-CM | POA: Diagnosis not present

## 2021-02-09 DIAGNOSIS — K5904 Chronic idiopathic constipation: Secondary | ICD-10-CM | POA: Diagnosis not present

## 2021-02-09 DIAGNOSIS — M8580 Other specified disorders of bone density and structure, unspecified site: Secondary | ICD-10-CM | POA: Diagnosis not present

## 2021-02-09 DIAGNOSIS — N390 Urinary tract infection, site not specified: Secondary | ICD-10-CM | POA: Diagnosis not present

## 2021-02-09 DIAGNOSIS — Z0000 Encounter for general adult medical examination without abnormal findings: Secondary | ICD-10-CM | POA: Diagnosis not present

## 2021-02-09 DIAGNOSIS — K5900 Constipation, unspecified: Secondary | ICD-10-CM | POA: Diagnosis not present

## 2021-02-09 DIAGNOSIS — J45909 Unspecified asthma, uncomplicated: Secondary | ICD-10-CM | POA: Diagnosis not present

## 2021-02-09 DIAGNOSIS — Z23 Encounter for immunization: Secondary | ICD-10-CM | POA: Diagnosis not present

## 2021-02-09 DIAGNOSIS — E43 Unspecified severe protein-calorie malnutrition: Secondary | ICD-10-CM | POA: Diagnosis not present

## 2021-02-09 DIAGNOSIS — R634 Abnormal weight loss: Secondary | ICD-10-CM | POA: Diagnosis not present

## 2021-02-09 DIAGNOSIS — D509 Iron deficiency anemia, unspecified: Secondary | ICD-10-CM | POA: Diagnosis not present

## 2021-02-09 DIAGNOSIS — N3946 Mixed incontinence: Secondary | ICD-10-CM | POA: Diagnosis not present

## 2021-02-09 DIAGNOSIS — N1330 Unspecified hydronephrosis: Secondary | ICD-10-CM | POA: Diagnosis not present

## 2021-02-09 DIAGNOSIS — Z1389 Encounter for screening for other disorder: Secondary | ICD-10-CM | POA: Diagnosis not present

## 2021-02-09 DIAGNOSIS — Z87440 Personal history of urinary (tract) infections: Secondary | ICD-10-CM | POA: Diagnosis not present

## 2021-02-09 DIAGNOSIS — N323 Diverticulum of bladder: Secondary | ICD-10-CM | POA: Diagnosis not present

## 2021-02-09 DIAGNOSIS — I129 Hypertensive chronic kidney disease with stage 1 through stage 4 chronic kidney disease, or unspecified chronic kidney disease: Secondary | ICD-10-CM | POA: Diagnosis not present

## 2021-02-09 DIAGNOSIS — Z7409 Other reduced mobility: Secondary | ICD-10-CM | POA: Diagnosis not present

## 2021-02-09 DIAGNOSIS — Z96642 Presence of left artificial hip joint: Secondary | ICD-10-CM | POA: Diagnosis not present

## 2021-02-09 DIAGNOSIS — Z96653 Presence of artificial knee joint, bilateral: Secondary | ICD-10-CM | POA: Diagnosis not present

## 2021-02-09 DIAGNOSIS — I10 Essential (primary) hypertension: Secondary | ICD-10-CM | POA: Diagnosis not present

## 2021-02-09 DIAGNOSIS — N179 Acute kidney failure, unspecified: Secondary | ICD-10-CM | POA: Diagnosis not present

## 2021-02-09 DIAGNOSIS — L659 Nonscarring hair loss, unspecified: Secondary | ICD-10-CM | POA: Diagnosis not present

## 2021-02-09 DIAGNOSIS — E7800 Pure hypercholesterolemia, unspecified: Secondary | ICD-10-CM | POA: Diagnosis not present

## 2021-02-09 DIAGNOSIS — F32A Depression, unspecified: Secondary | ICD-10-CM | POA: Diagnosis not present

## 2021-02-09 DIAGNOSIS — K5909 Other constipation: Secondary | ICD-10-CM | POA: Diagnosis not present

## 2021-02-09 DIAGNOSIS — N1831 Chronic kidney disease, stage 3a: Secondary | ICD-10-CM | POA: Diagnosis not present

## 2021-02-09 DIAGNOSIS — E872 Acidosis: Secondary | ICD-10-CM | POA: Diagnosis not present

## 2021-02-09 DIAGNOSIS — R531 Weakness: Secondary | ICD-10-CM | POA: Diagnosis not present

## 2021-02-09 DIAGNOSIS — M8660 Other chronic osteomyelitis, unspecified site: Secondary | ICD-10-CM | POA: Diagnosis not present

## 2021-02-09 DIAGNOSIS — E440 Moderate protein-calorie malnutrition: Secondary | ICD-10-CM | POA: Diagnosis not present

## 2021-02-09 DIAGNOSIS — K219 Gastro-esophageal reflux disease without esophagitis: Secondary | ICD-10-CM | POA: Diagnosis not present

## 2021-02-09 DIAGNOSIS — R627 Adult failure to thrive: Secondary | ICD-10-CM | POA: Diagnosis not present

## 2021-02-09 DIAGNOSIS — R7989 Other specified abnormal findings of blood chemistry: Secondary | ICD-10-CM | POA: Diagnosis not present

## 2021-02-10 DIAGNOSIS — Z7409 Other reduced mobility: Secondary | ICD-10-CM | POA: Diagnosis not present

## 2021-02-10 DIAGNOSIS — N3946 Mixed incontinence: Secondary | ICD-10-CM | POA: Diagnosis not present

## 2021-02-10 DIAGNOSIS — E440 Moderate protein-calorie malnutrition: Secondary | ICD-10-CM | POA: Diagnosis not present

## 2021-02-10 DIAGNOSIS — E785 Hyperlipidemia, unspecified: Secondary | ICD-10-CM | POA: Diagnosis not present

## 2021-02-10 DIAGNOSIS — I10 Essential (primary) hypertension: Secondary | ICD-10-CM | POA: Diagnosis not present

## 2021-02-10 DIAGNOSIS — K5900 Constipation, unspecified: Secondary | ICD-10-CM | POA: Diagnosis not present

## 2021-02-10 DIAGNOSIS — R531 Weakness: Secondary | ICD-10-CM | POA: Diagnosis not present

## 2021-02-10 DIAGNOSIS — K219 Gastro-esophageal reflux disease without esophagitis: Secondary | ICD-10-CM | POA: Diagnosis not present

## 2021-02-10 DIAGNOSIS — M8660 Other chronic osteomyelitis, unspecified site: Secondary | ICD-10-CM | POA: Diagnosis not present

## 2021-02-10 DIAGNOSIS — N179 Acute kidney failure, unspecified: Secondary | ICD-10-CM | POA: Diagnosis not present

## 2021-02-10 DIAGNOSIS — R627 Adult failure to thrive: Secondary | ICD-10-CM | POA: Diagnosis not present

## 2021-02-10 DIAGNOSIS — N390 Urinary tract infection, site not specified: Secondary | ICD-10-CM | POA: Diagnosis not present

## 2021-02-20 DIAGNOSIS — I10 Essential (primary) hypertension: Secondary | ICD-10-CM | POA: Diagnosis not present

## 2021-02-20 DIAGNOSIS — Z0000 Encounter for general adult medical examination without abnormal findings: Secondary | ICD-10-CM | POA: Diagnosis not present

## 2021-02-20 DIAGNOSIS — R634 Abnormal weight loss: Secondary | ICD-10-CM | POA: Diagnosis not present

## 2021-02-20 DIAGNOSIS — E7800 Pure hypercholesterolemia, unspecified: Secondary | ICD-10-CM | POA: Diagnosis not present

## 2021-02-20 DIAGNOSIS — Z23 Encounter for immunization: Secondary | ICD-10-CM | POA: Diagnosis not present

## 2021-02-20 DIAGNOSIS — Z1389 Encounter for screening for other disorder: Secondary | ICD-10-CM | POA: Diagnosis not present

## 2021-02-20 DIAGNOSIS — E872 Acidosis: Secondary | ICD-10-CM | POA: Diagnosis not present

## 2021-02-20 DIAGNOSIS — D509 Iron deficiency anemia, unspecified: Secondary | ICD-10-CM | POA: Diagnosis not present

## 2021-02-20 DIAGNOSIS — E039 Hypothyroidism, unspecified: Secondary | ICD-10-CM | POA: Diagnosis not present

## 2021-02-20 DIAGNOSIS — F32A Depression, unspecified: Secondary | ICD-10-CM | POA: Diagnosis not present

## 2021-02-20 DIAGNOSIS — K219 Gastro-esophageal reflux disease without esophagitis: Secondary | ICD-10-CM | POA: Diagnosis not present

## 2021-02-20 DIAGNOSIS — N1831 Chronic kidney disease, stage 3a: Secondary | ICD-10-CM | POA: Diagnosis not present

## 2021-02-20 DIAGNOSIS — K5909 Other constipation: Secondary | ICD-10-CM | POA: Diagnosis not present

## 2021-02-20 DIAGNOSIS — R627 Adult failure to thrive: Secondary | ICD-10-CM | POA: Diagnosis not present

## 2021-02-22 DIAGNOSIS — Z23 Encounter for immunization: Secondary | ICD-10-CM | POA: Diagnosis not present

## 2021-02-24 DIAGNOSIS — N179 Acute kidney failure, unspecified: Secondary | ICD-10-CM | POA: Diagnosis not present

## 2021-02-27 DIAGNOSIS — J4520 Mild intermittent asthma, uncomplicated: Secondary | ICD-10-CM | POA: Diagnosis not present

## 2021-02-27 DIAGNOSIS — Z91018 Allergy to other foods: Secondary | ICD-10-CM | POA: Diagnosis not present

## 2021-02-27 DIAGNOSIS — J302 Other seasonal allergic rhinitis: Secondary | ICD-10-CM | POA: Diagnosis not present

## 2021-02-28 DIAGNOSIS — R2681 Unsteadiness on feet: Secondary | ICD-10-CM | POA: Diagnosis not present

## 2021-02-28 DIAGNOSIS — R627 Adult failure to thrive: Secondary | ICD-10-CM | POA: Diagnosis not present

## 2021-02-28 DIAGNOSIS — N1330 Unspecified hydronephrosis: Secondary | ICD-10-CM | POA: Diagnosis not present

## 2021-02-28 DIAGNOSIS — M6281 Muscle weakness (generalized): Secondary | ICD-10-CM | POA: Diagnosis not present

## 2021-03-01 DIAGNOSIS — R2681 Unsteadiness on feet: Secondary | ICD-10-CM | POA: Diagnosis not present

## 2021-03-01 DIAGNOSIS — M6281 Muscle weakness (generalized): Secondary | ICD-10-CM | POA: Diagnosis not present

## 2021-03-01 DIAGNOSIS — R627 Adult failure to thrive: Secondary | ICD-10-CM | POA: Diagnosis not present

## 2021-03-01 DIAGNOSIS — N1330 Unspecified hydronephrosis: Secondary | ICD-10-CM | POA: Diagnosis not present

## 2021-03-02 DIAGNOSIS — N1831 Chronic kidney disease, stage 3a: Secondary | ICD-10-CM | POA: Diagnosis not present

## 2021-03-07 DIAGNOSIS — N1832 Chronic kidney disease, stage 3b: Secondary | ICD-10-CM | POA: Diagnosis not present

## 2021-03-07 DIAGNOSIS — N179 Acute kidney failure, unspecified: Secondary | ICD-10-CM | POA: Diagnosis not present

## 2021-03-07 DIAGNOSIS — I129 Hypertensive chronic kidney disease with stage 1 through stage 4 chronic kidney disease, or unspecified chronic kidney disease: Secondary | ICD-10-CM | POA: Diagnosis not present

## 2021-03-07 DIAGNOSIS — D631 Anemia in chronic kidney disease: Secondary | ICD-10-CM | POA: Diagnosis not present

## 2021-03-07 DIAGNOSIS — N390 Urinary tract infection, site not specified: Secondary | ICD-10-CM | POA: Diagnosis not present

## 2021-03-07 DIAGNOSIS — N2581 Secondary hyperparathyroidism of renal origin: Secondary | ICD-10-CM | POA: Diagnosis not present

## 2021-03-08 DIAGNOSIS — R2681 Unsteadiness on feet: Secondary | ICD-10-CM | POA: Diagnosis not present

## 2021-03-08 DIAGNOSIS — N1330 Unspecified hydronephrosis: Secondary | ICD-10-CM | POA: Diagnosis not present

## 2021-03-08 DIAGNOSIS — M6281 Muscle weakness (generalized): Secondary | ICD-10-CM | POA: Diagnosis not present

## 2021-03-08 DIAGNOSIS — N1832 Chronic kidney disease, stage 3b: Secondary | ICD-10-CM | POA: Diagnosis not present

## 2021-03-08 DIAGNOSIS — R627 Adult failure to thrive: Secondary | ICD-10-CM | POA: Diagnosis not present

## 2021-03-10 ENCOUNTER — Other Ambulatory Visit: Payer: Self-pay | Admitting: Nephrology

## 2021-03-10 DIAGNOSIS — R627 Adult failure to thrive: Secondary | ICD-10-CM | POA: Diagnosis not present

## 2021-03-10 DIAGNOSIS — N179 Acute kidney failure, unspecified: Secondary | ICD-10-CM

## 2021-03-10 DIAGNOSIS — R2681 Unsteadiness on feet: Secondary | ICD-10-CM | POA: Diagnosis not present

## 2021-03-10 DIAGNOSIS — N1832 Chronic kidney disease, stage 3b: Secondary | ICD-10-CM

## 2021-03-10 DIAGNOSIS — N1330 Unspecified hydronephrosis: Secondary | ICD-10-CM | POA: Diagnosis not present

## 2021-03-10 DIAGNOSIS — M6281 Muscle weakness (generalized): Secondary | ICD-10-CM | POA: Diagnosis not present

## 2021-03-13 DIAGNOSIS — R627 Adult failure to thrive: Secondary | ICD-10-CM | POA: Diagnosis not present

## 2021-03-13 DIAGNOSIS — R2681 Unsteadiness on feet: Secondary | ICD-10-CM | POA: Diagnosis not present

## 2021-03-13 DIAGNOSIS — M6281 Muscle weakness (generalized): Secondary | ICD-10-CM | POA: Diagnosis not present

## 2021-03-13 DIAGNOSIS — N1330 Unspecified hydronephrosis: Secondary | ICD-10-CM | POA: Diagnosis not present

## 2021-03-14 ENCOUNTER — Ambulatory Visit
Admission: RE | Admit: 2021-03-14 | Discharge: 2021-03-14 | Disposition: A | Discharge status: Home / Self Care | Payer: Medicare Other | Source: Ambulatory Visit | Attending: Nephrology | Admitting: Nephrology

## 2021-03-14 DIAGNOSIS — N281 Cyst of kidney, acquired: Secondary | ICD-10-CM | POA: Diagnosis not present

## 2021-03-14 DIAGNOSIS — N179 Acute kidney failure, unspecified: Secondary | ICD-10-CM | POA: Diagnosis not present

## 2021-03-14 DIAGNOSIS — N1832 Chronic kidney disease, stage 3b: Secondary | ICD-10-CM

## 2021-03-14 DIAGNOSIS — N1330 Unspecified hydronephrosis: Secondary | ICD-10-CM | POA: Diagnosis not present

## 2021-03-15 DIAGNOSIS — R627 Adult failure to thrive: Secondary | ICD-10-CM | POA: Diagnosis not present

## 2021-03-15 DIAGNOSIS — R2681 Unsteadiness on feet: Secondary | ICD-10-CM | POA: Diagnosis not present

## 2021-03-15 DIAGNOSIS — N1330 Unspecified hydronephrosis: Secondary | ICD-10-CM | POA: Diagnosis not present

## 2021-03-15 DIAGNOSIS — M6281 Muscle weakness (generalized): Secondary | ICD-10-CM | POA: Diagnosis not present

## 2021-03-22 DIAGNOSIS — R627 Adult failure to thrive: Secondary | ICD-10-CM | POA: Diagnosis not present

## 2021-03-22 DIAGNOSIS — R2681 Unsteadiness on feet: Secondary | ICD-10-CM | POA: Diagnosis not present

## 2021-03-22 DIAGNOSIS — M6281 Muscle weakness (generalized): Secondary | ICD-10-CM | POA: Diagnosis not present

## 2021-03-22 DIAGNOSIS — N1330 Unspecified hydronephrosis: Secondary | ICD-10-CM | POA: Diagnosis not present

## 2021-03-23 DIAGNOSIS — R627 Adult failure to thrive: Secondary | ICD-10-CM | POA: Diagnosis not present

## 2021-03-23 DIAGNOSIS — M6281 Muscle weakness (generalized): Secondary | ICD-10-CM | POA: Diagnosis not present

## 2021-03-23 DIAGNOSIS — N1330 Unspecified hydronephrosis: Secondary | ICD-10-CM | POA: Diagnosis not present

## 2021-03-23 DIAGNOSIS — R2681 Unsteadiness on feet: Secondary | ICD-10-CM | POA: Diagnosis not present

## 2021-03-29 DIAGNOSIS — N898 Other specified noninflammatory disorders of vagina: Secondary | ICD-10-CM | POA: Diagnosis not present

## 2021-03-29 DIAGNOSIS — Z9889 Other specified postprocedural states: Secondary | ICD-10-CM | POA: Diagnosis not present

## 2021-03-29 DIAGNOSIS — R32 Unspecified urinary incontinence: Secondary | ICD-10-CM | POA: Diagnosis not present

## 2021-03-30 DIAGNOSIS — R627 Adult failure to thrive: Secondary | ICD-10-CM | POA: Diagnosis not present

## 2021-03-30 DIAGNOSIS — R2681 Unsteadiness on feet: Secondary | ICD-10-CM | POA: Diagnosis not present

## 2021-03-30 DIAGNOSIS — N1330 Unspecified hydronephrosis: Secondary | ICD-10-CM | POA: Diagnosis not present

## 2021-03-30 DIAGNOSIS — M6281 Muscle weakness (generalized): Secondary | ICD-10-CM | POA: Diagnosis not present

## 2021-03-31 DIAGNOSIS — M6281 Muscle weakness (generalized): Secondary | ICD-10-CM | POA: Diagnosis not present

## 2021-03-31 DIAGNOSIS — N1330 Unspecified hydronephrosis: Secondary | ICD-10-CM | POA: Diagnosis not present

## 2021-03-31 DIAGNOSIS — R2681 Unsteadiness on feet: Secondary | ICD-10-CM | POA: Diagnosis not present

## 2021-03-31 DIAGNOSIS — R627 Adult failure to thrive: Secondary | ICD-10-CM | POA: Diagnosis not present

## 2021-04-04 DIAGNOSIS — I129 Hypertensive chronic kidney disease with stage 1 through stage 4 chronic kidney disease, or unspecified chronic kidney disease: Secondary | ICD-10-CM | POA: Diagnosis not present

## 2021-04-04 DIAGNOSIS — N2581 Secondary hyperparathyroidism of renal origin: Secondary | ICD-10-CM | POA: Diagnosis not present

## 2021-04-04 DIAGNOSIS — N189 Chronic kidney disease, unspecified: Secondary | ICD-10-CM | POA: Diagnosis not present

## 2021-04-04 DIAGNOSIS — E872 Acidosis, unspecified: Secondary | ICD-10-CM | POA: Diagnosis not present

## 2021-04-04 DIAGNOSIS — N1832 Chronic kidney disease, stage 3b: Secondary | ICD-10-CM | POA: Diagnosis not present

## 2021-04-04 DIAGNOSIS — D631 Anemia in chronic kidney disease: Secondary | ICD-10-CM | POA: Diagnosis not present

## 2021-04-04 DIAGNOSIS — N179 Acute kidney failure, unspecified: Secondary | ICD-10-CM | POA: Diagnosis not present

## 2021-04-06 DIAGNOSIS — R2681 Unsteadiness on feet: Secondary | ICD-10-CM | POA: Diagnosis not present

## 2021-04-06 DIAGNOSIS — N1330 Unspecified hydronephrosis: Secondary | ICD-10-CM | POA: Diagnosis not present

## 2021-04-06 DIAGNOSIS — M6281 Muscle weakness (generalized): Secondary | ICD-10-CM | POA: Diagnosis not present

## 2021-04-06 DIAGNOSIS — R627 Adult failure to thrive: Secondary | ICD-10-CM | POA: Diagnosis not present

## 2021-04-07 DIAGNOSIS — R2681 Unsteadiness on feet: Secondary | ICD-10-CM | POA: Diagnosis not present

## 2021-04-07 DIAGNOSIS — M6281 Muscle weakness (generalized): Secondary | ICD-10-CM | POA: Diagnosis not present

## 2021-04-07 DIAGNOSIS — R627 Adult failure to thrive: Secondary | ICD-10-CM | POA: Diagnosis not present

## 2021-04-07 DIAGNOSIS — N1330 Unspecified hydronephrosis: Secondary | ICD-10-CM | POA: Diagnosis not present

## 2021-04-10 DIAGNOSIS — R627 Adult failure to thrive: Secondary | ICD-10-CM | POA: Diagnosis not present

## 2021-04-10 DIAGNOSIS — N1330 Unspecified hydronephrosis: Secondary | ICD-10-CM | POA: Diagnosis not present

## 2021-04-10 DIAGNOSIS — M6281 Muscle weakness (generalized): Secondary | ICD-10-CM | POA: Diagnosis not present

## 2021-04-10 DIAGNOSIS — R2681 Unsteadiness on feet: Secondary | ICD-10-CM | POA: Diagnosis not present

## 2021-04-12 DIAGNOSIS — N1330 Unspecified hydronephrosis: Secondary | ICD-10-CM | POA: Diagnosis not present

## 2021-04-12 DIAGNOSIS — M6281 Muscle weakness (generalized): Secondary | ICD-10-CM | POA: Diagnosis not present

## 2021-04-12 DIAGNOSIS — R627 Adult failure to thrive: Secondary | ICD-10-CM | POA: Diagnosis not present

## 2021-04-12 DIAGNOSIS — R2681 Unsteadiness on feet: Secondary | ICD-10-CM | POA: Diagnosis not present

## 2021-04-17 ENCOUNTER — Other Ambulatory Visit: Payer: Self-pay | Admitting: Internal Medicine

## 2021-04-17 DIAGNOSIS — R627 Adult failure to thrive: Secondary | ICD-10-CM | POA: Diagnosis not present

## 2021-04-17 DIAGNOSIS — M6281 Muscle weakness (generalized): Secondary | ICD-10-CM | POA: Diagnosis not present

## 2021-04-17 DIAGNOSIS — Z1231 Encounter for screening mammogram for malignant neoplasm of breast: Secondary | ICD-10-CM

## 2021-04-17 DIAGNOSIS — N1832 Chronic kidney disease, stage 3b: Secondary | ICD-10-CM | POA: Diagnosis not present

## 2021-04-17 DIAGNOSIS — R2681 Unsteadiness on feet: Secondary | ICD-10-CM | POA: Diagnosis not present

## 2021-04-17 DIAGNOSIS — I10 Essential (primary) hypertension: Secondary | ICD-10-CM | POA: Diagnosis not present

## 2021-04-17 DIAGNOSIS — N1330 Unspecified hydronephrosis: Secondary | ICD-10-CM | POA: Diagnosis not present

## 2021-04-17 DIAGNOSIS — K219 Gastro-esophageal reflux disease without esophagitis: Secondary | ICD-10-CM | POA: Diagnosis not present

## 2021-04-18 ENCOUNTER — Other Ambulatory Visit: Payer: Self-pay

## 2021-04-18 ENCOUNTER — Ambulatory Visit (HOSPITAL_COMMUNITY)
Admission: RE | Admit: 2021-04-18 | Discharge: 2021-04-18 | Disposition: A | Discharge status: Home / Self Care | Payer: Medicare Other | Source: Ambulatory Visit | Attending: Nephrology | Admitting: Nephrology

## 2021-04-18 VITALS — BP 130/76 | HR 93 | Temp 97.8°F | Resp 18

## 2021-04-18 DIAGNOSIS — D631 Anemia in chronic kidney disease: Secondary | ICD-10-CM | POA: Insufficient documentation

## 2021-04-18 DIAGNOSIS — N184 Chronic kidney disease, stage 4 (severe): Secondary | ICD-10-CM | POA: Diagnosis not present

## 2021-04-18 DIAGNOSIS — N183 Chronic kidney disease, stage 3 unspecified: Secondary | ICD-10-CM

## 2021-04-18 LAB — POCT HEMOGLOBIN-HEMACUE: Hemoglobin: 9.2 g/dL — ABNORMAL LOW (ref 12.0–15.0)

## 2021-04-18 MED ORDER — DARBEPOETIN ALFA 40 MCG/0.4ML IJ SOSY: 40.0000 ug | PREFILLED_SYRINGE | INTRAMUSCULAR | Status: DC

## 2021-04-18 MED ORDER — DARBEPOETIN ALFA 40 MCG/0.4ML IJ SOSY
PREFILLED_SYRINGE | INTRAMUSCULAR | Status: AC
  Administered 2021-04-18: 40 ug via SUBCUTANEOUS
  Filled 2021-04-18: qty 0.4

## 2021-04-19 DIAGNOSIS — R2681 Unsteadiness on feet: Secondary | ICD-10-CM | POA: Diagnosis not present

## 2021-04-19 DIAGNOSIS — N1330 Unspecified hydronephrosis: Secondary | ICD-10-CM | POA: Diagnosis not present

## 2021-04-19 DIAGNOSIS — M6281 Muscle weakness (generalized): Secondary | ICD-10-CM | POA: Diagnosis not present

## 2021-04-19 DIAGNOSIS — R627 Adult failure to thrive: Secondary | ICD-10-CM | POA: Diagnosis not present

## 2021-04-24 DIAGNOSIS — R2681 Unsteadiness on feet: Secondary | ICD-10-CM | POA: Diagnosis not present

## 2021-04-24 DIAGNOSIS — R627 Adult failure to thrive: Secondary | ICD-10-CM | POA: Diagnosis not present

## 2021-04-24 DIAGNOSIS — N1330 Unspecified hydronephrosis: Secondary | ICD-10-CM | POA: Diagnosis not present

## 2021-04-24 DIAGNOSIS — M6281 Muscle weakness (generalized): Secondary | ICD-10-CM | POA: Diagnosis not present

## 2021-04-26 DIAGNOSIS — N1330 Unspecified hydronephrosis: Secondary | ICD-10-CM | POA: Diagnosis not present

## 2021-04-26 DIAGNOSIS — R627 Adult failure to thrive: Secondary | ICD-10-CM | POA: Diagnosis not present

## 2021-04-26 DIAGNOSIS — M6281 Muscle weakness (generalized): Secondary | ICD-10-CM | POA: Diagnosis not present

## 2021-04-26 DIAGNOSIS — R2681 Unsteadiness on feet: Secondary | ICD-10-CM | POA: Diagnosis not present

## 2021-05-01 DIAGNOSIS — N1330 Unspecified hydronephrosis: Secondary | ICD-10-CM | POA: Diagnosis not present

## 2021-05-01 DIAGNOSIS — M6281 Muscle weakness (generalized): Secondary | ICD-10-CM | POA: Diagnosis not present

## 2021-05-01 DIAGNOSIS — R2681 Unsteadiness on feet: Secondary | ICD-10-CM | POA: Diagnosis not present

## 2021-05-01 DIAGNOSIS — R627 Adult failure to thrive: Secondary | ICD-10-CM | POA: Diagnosis not present

## 2021-05-08 DIAGNOSIS — I480 Paroxysmal atrial fibrillation: Secondary | ICD-10-CM | POA: Diagnosis not present

## 2021-05-08 DIAGNOSIS — M8580 Other specified disorders of bone density and structure, unspecified site: Secondary | ICD-10-CM | POA: Diagnosis not present

## 2021-05-08 DIAGNOSIS — E7800 Pure hypercholesterolemia, unspecified: Secondary | ICD-10-CM | POA: Diagnosis not present

## 2021-05-08 DIAGNOSIS — N1330 Unspecified hydronephrosis: Secondary | ICD-10-CM | POA: Diagnosis not present

## 2021-05-08 DIAGNOSIS — R2681 Unsteadiness on feet: Secondary | ICD-10-CM | POA: Diagnosis not present

## 2021-05-08 DIAGNOSIS — K219 Gastro-esophageal reflux disease without esophagitis: Secondary | ICD-10-CM | POA: Diagnosis not present

## 2021-05-08 DIAGNOSIS — D649 Anemia, unspecified: Secondary | ICD-10-CM | POA: Diagnosis not present

## 2021-05-08 DIAGNOSIS — J4520 Mild intermittent asthma, uncomplicated: Secondary | ICD-10-CM | POA: Diagnosis not present

## 2021-05-08 DIAGNOSIS — I10 Essential (primary) hypertension: Secondary | ICD-10-CM | POA: Diagnosis not present

## 2021-05-08 DIAGNOSIS — M6281 Muscle weakness (generalized): Secondary | ICD-10-CM | POA: Diagnosis not present

## 2021-05-08 DIAGNOSIS — F329 Major depressive disorder, single episode, unspecified: Secondary | ICD-10-CM | POA: Diagnosis not present

## 2021-05-08 DIAGNOSIS — N1832 Chronic kidney disease, stage 3b: Secondary | ICD-10-CM | POA: Diagnosis not present

## 2021-05-08 DIAGNOSIS — R627 Adult failure to thrive: Secondary | ICD-10-CM | POA: Diagnosis not present

## 2021-05-08 DIAGNOSIS — E039 Hypothyroidism, unspecified: Secondary | ICD-10-CM | POA: Diagnosis not present

## 2021-05-09 DIAGNOSIS — R5383 Other fatigue: Secondary | ICD-10-CM | POA: Diagnosis not present

## 2021-05-09 DIAGNOSIS — N1831 Chronic kidney disease, stage 3a: Secondary | ICD-10-CM | POA: Diagnosis not present

## 2021-05-15 DIAGNOSIS — M6281 Muscle weakness (generalized): Secondary | ICD-10-CM | POA: Diagnosis not present

## 2021-05-15 DIAGNOSIS — R627 Adult failure to thrive: Secondary | ICD-10-CM | POA: Diagnosis not present

## 2021-05-15 DIAGNOSIS — R2681 Unsteadiness on feet: Secondary | ICD-10-CM | POA: Diagnosis not present

## 2021-05-15 DIAGNOSIS — N1330 Unspecified hydronephrosis: Secondary | ICD-10-CM | POA: Diagnosis not present

## 2021-05-16 ENCOUNTER — Other Ambulatory Visit: Payer: Self-pay

## 2021-05-16 ENCOUNTER — Ambulatory Visit (HOSPITAL_COMMUNITY)
Admission: RE | Admit: 2021-05-16 | Discharge: 2021-05-16 | Disposition: A | Discharge status: Home / Self Care | Payer: Medicare Other | Source: Ambulatory Visit | Attending: Nephrology | Admitting: Nephrology

## 2021-05-16 VITALS — BP 134/62 | HR 84 | Temp 97.8°F | Resp 18

## 2021-05-16 DIAGNOSIS — D631 Anemia in chronic kidney disease: Secondary | ICD-10-CM | POA: Diagnosis not present

## 2021-05-16 DIAGNOSIS — N183 Chronic kidney disease, stage 3 unspecified: Secondary | ICD-10-CM | POA: Insufficient documentation

## 2021-05-16 LAB — IRON AND TIBC
Iron: 42 ug/dL (ref 28–170)
Saturation Ratios: 19 % (ref 10.4–31.8)
TIBC: 217 ug/dL — ABNORMAL LOW (ref 250–450)
UIBC: 175 ug/dL

## 2021-05-16 LAB — FERRITIN: Ferritin: 90 ng/mL (ref 11–307)

## 2021-05-16 LAB — POCT HEMOGLOBIN-HEMACUE: Hemoglobin: 9.6 g/dL — ABNORMAL LOW (ref 12.0–15.0)

## 2021-05-16 MED ORDER — DARBEPOETIN ALFA 40 MCG/0.4ML IJ SOSY
PREFILLED_SYRINGE | INTRAMUSCULAR | Status: AC
  Filled 2021-05-16: qty 0.4

## 2021-05-16 MED ORDER — DARBEPOETIN ALFA 40 MCG/0.4ML IJ SOSY
40.0000 ug | PREFILLED_SYRINGE | INTRAMUSCULAR | Status: DC
  Administered 2021-05-16: 40 ug via SUBCUTANEOUS

## 2021-05-23 ENCOUNTER — Ambulatory Visit
Admission: RE | Admit: 2021-05-23 | Discharge: 2021-05-23 | Disposition: A | Discharge status: Home / Self Care | Payer: Medicare Other | Source: Ambulatory Visit | Attending: Internal Medicine | Admitting: Internal Medicine

## 2021-05-23 DIAGNOSIS — Z1231 Encounter for screening mammogram for malignant neoplasm of breast: Secondary | ICD-10-CM | POA: Diagnosis not present

## 2021-05-25 DIAGNOSIS — E872 Acidosis, unspecified: Secondary | ICD-10-CM | POA: Diagnosis not present

## 2021-05-25 DIAGNOSIS — N2581 Secondary hyperparathyroidism of renal origin: Secondary | ICD-10-CM | POA: Diagnosis not present

## 2021-05-25 DIAGNOSIS — I129 Hypertensive chronic kidney disease with stage 1 through stage 4 chronic kidney disease, or unspecified chronic kidney disease: Secondary | ICD-10-CM | POA: Diagnosis not present

## 2021-05-25 DIAGNOSIS — D631 Anemia in chronic kidney disease: Secondary | ICD-10-CM | POA: Diagnosis not present

## 2021-05-25 DIAGNOSIS — N184 Chronic kidney disease, stage 4 (severe): Secondary | ICD-10-CM | POA: Diagnosis not present

## 2021-05-25 DIAGNOSIS — N179 Acute kidney failure, unspecified: Secondary | ICD-10-CM | POA: Diagnosis not present

## 2021-06-13 ENCOUNTER — Encounter (HOSPITAL_COMMUNITY)
Admission: RE | Admit: 2021-06-13 | Discharge: 2021-06-13 | Disposition: A | Discharge status: Home / Self Care | Payer: Medicare Other | Source: Ambulatory Visit | Attending: Nephrology | Admitting: Nephrology

## 2021-06-13 VITALS — BP 133/67 | HR 98

## 2021-06-13 DIAGNOSIS — D631 Anemia in chronic kidney disease: Secondary | ICD-10-CM | POA: Insufficient documentation

## 2021-06-13 DIAGNOSIS — N183 Chronic kidney disease, stage 3 unspecified: Secondary | ICD-10-CM | POA: Insufficient documentation

## 2021-06-13 LAB — IRON AND TIBC
Iron: 42 ug/dL (ref 28–170)
Saturation Ratios: 18 % (ref 10.4–31.8)
TIBC: 234 ug/dL — ABNORMAL LOW (ref 250–450)
UIBC: 192 ug/dL

## 2021-06-13 LAB — FERRITIN: Ferritin: 25 ng/mL (ref 11–307)

## 2021-06-13 LAB — POCT HEMOGLOBIN-HEMACUE: Hemoglobin: 9.8 g/dL — ABNORMAL LOW (ref 12.0–15.0)

## 2021-06-13 MED ORDER — DARBEPOETIN ALFA 40 MCG/0.4ML IJ SOSY
PREFILLED_SYRINGE | INTRAMUSCULAR | Status: AC
  Filled 2021-06-13: qty 0.4

## 2021-06-13 MED ORDER — DARBEPOETIN ALFA 40 MCG/0.4ML IJ SOSY
40.0000 ug | PREFILLED_SYRINGE | INTRAMUSCULAR | Status: DC
  Administered 2021-06-13: 40 ug via SUBCUTANEOUS

## 2021-07-04 DIAGNOSIS — H04123 Dry eye syndrome of bilateral lacrimal glands: Secondary | ICD-10-CM | POA: Diagnosis not present

## 2021-07-04 DIAGNOSIS — H04561 Stenosis of right lacrimal punctum: Secondary | ICD-10-CM | POA: Diagnosis not present

## 2021-07-04 DIAGNOSIS — H04223 Epiphora due to insufficient drainage, bilateral lacrimal glands: Secondary | ICD-10-CM | POA: Diagnosis not present

## 2021-07-04 DIAGNOSIS — H04412 Chronic dacryocystitis of left lacrimal passage: Secondary | ICD-10-CM | POA: Diagnosis not present

## 2021-07-04 DIAGNOSIS — H02135 Senile ectropion of left lower eyelid: Secondary | ICD-10-CM | POA: Diagnosis not present

## 2021-07-04 DIAGNOSIS — H04553 Acquired stenosis of bilateral nasolacrimal duct: Secondary | ICD-10-CM | POA: Diagnosis not present

## 2021-07-04 DIAGNOSIS — H02132 Senile ectropion of right lower eyelid: Secondary | ICD-10-CM | POA: Diagnosis not present

## 2021-07-04 DIAGNOSIS — J3489 Other specified disorders of nose and nasal sinuses: Secondary | ICD-10-CM | POA: Diagnosis not present

## 2021-07-11 ENCOUNTER — Other Ambulatory Visit: Payer: Self-pay

## 2021-07-11 ENCOUNTER — Ambulatory Visit (HOSPITAL_COMMUNITY)
Admission: RE | Admit: 2021-07-11 | Discharge: 2021-07-11 | Disposition: A | Discharge status: Home / Self Care | Payer: Medicare Other | Source: Ambulatory Visit | Attending: Nephrology | Admitting: Nephrology

## 2021-07-11 VITALS — BP 133/79 | HR 86 | Temp 97.0°F | Resp 18

## 2021-07-11 DIAGNOSIS — N183 Chronic kidney disease, stage 3 unspecified: Secondary | ICD-10-CM | POA: Insufficient documentation

## 2021-07-11 DIAGNOSIS — D631 Anemia in chronic kidney disease: Secondary | ICD-10-CM | POA: Insufficient documentation

## 2021-07-11 LAB — POCT HEMOGLOBIN-HEMACUE: Hemoglobin: 10.1 g/dL — ABNORMAL LOW (ref 12.0–15.0)

## 2021-07-11 MED ORDER — DARBEPOETIN ALFA 40 MCG/0.4ML IJ SOSY
40.0000 ug | PREFILLED_SYRINGE | INTRAMUSCULAR | Status: DC
  Administered 2021-07-11: 40 ug via SUBCUTANEOUS

## 2021-07-11 MED ORDER — DARBEPOETIN ALFA 40 MCG/0.4ML IJ SOSY
PREFILLED_SYRINGE | INTRAMUSCULAR | Status: AC
  Filled 2021-07-11: qty 0.4

## 2021-07-12 ENCOUNTER — Encounter: Payer: Self-pay | Admitting: Cardiology

## 2021-07-12 ENCOUNTER — Other Ambulatory Visit: Payer: Self-pay

## 2021-07-12 ENCOUNTER — Ambulatory Visit: Payer: Medicare Other | Admitting: Cardiology

## 2021-07-12 VITALS — BP 142/76 | HR 74 | Temp 97.6°F | Resp 16 | Ht 64.0 in | Wt 148.0 lb

## 2021-07-12 DIAGNOSIS — Z01810 Encounter for preprocedural cardiovascular examination: Secondary | ICD-10-CM

## 2021-07-12 DIAGNOSIS — I491 Atrial premature depolarization: Secondary | ICD-10-CM | POA: Diagnosis not present

## 2021-07-12 NOTE — Progress Notes (Signed)
Patient referred by [name], MD for irregular heart rhythm  Subjective:   Linda Rice, female    DOB: [birth year], 81 y.o.   MRN: [id number]   Chief Complaint  Patient presents with   [facility]   Follow-up    1 year     HPI  81 y.o. Caucasian female with hypertension, hyperlipidemia, asthma, hypothyroidism, frequent PACs  Patient is doing well, denies any chest pain, palpitations, dyspnea symptoms.  Blood pressure slightly elevated today, but usually well controlled.  Her asthma is very well controlled.  Patient is going to undergo surgery on her lacrimal gland ducts in the near future.  Current Outpatient Medications on File Prior to Visit  Medication Sig Dispense Refill   albuterol (VENTOLIN HFA) 108 (90 Base) MCG/ACT inhaler Inhale 1-2 puffs into the lungs every 6 (six) hours as needed for wheezing or shortness of breath. 18 g 1   atorvastatin (LIPITOR) 10 MG tablet Take 10 mg by mouth daily.     CALCIUM CITRATE PO Take 500 mg by mouth daily.     Cholecalciferol (VITAMIN D3) 125 MCG (5000 UT) CAPS Take 1 capsule by mouth every 7 (seven) days.     doxycycline (VIBRAMYCIN) 100 MG capsule Take 100 mg by mouth 2 (two) times daily.  4   EPINEPHrine 0.3 mg/0.3 mL IJ SOAJ injection Inject 0.3 mLs (0.3 mg total) into the muscle as needed for anaphylaxis. 2 each 1   famotidine (PEPCID) 40 MG tablet Take 40 mg by mouth daily.     FLUoxetine (PROZAC) 10 MG capsule Take 1 capsule (10 mg total) by mouth daily. 30 capsule 3   Levothyroxine Sodium 88 MCG CAPS Take 88 mcg by mouth daily before breakfast.     mirtazapine (REMERON) 15 MG tablet Take 15 mg by mouth at bedtime.     OLANZapine (ZYPREXA) 2.5 MG tablet Take 2.5 mg by mouth daily.     senna (SENOKOT) 8.6 MG TABS tablet 2 tablets at bedtime as needed     sodium bicarbonate 650 MG tablet Take 1,300 mg by mouth 3 (three) times daily.     No current facility-administered medications on file prior to visit.     Cardiovascular studies:  EKG 07/12/2021: Sinus rhythm 66 bpm Frequent PACs  Poor R-wave progression Cannot exclude old anterior infarct  Low voltage  Echocardiogram 06/15/2020:  Normal LV systolic function with visual EF 65-70%. Left ventricle cavity  is normal in size. Normal global wall motion. Normal diastolic filling  pattern, normal LAP.  Mild (Grade I) aortic regurgitation. Aortic valve sclerosis without  stenosis.  Mild (Grade I) mitral regurgitation.  Mild tricuspid regurgitation. No evidence of pulmonary hypertension.  Compared to prior study dated 06/23/2018 no significant changes.   Recent labs: 03/23/2019: Glucose 128. BUN/Cr 15/0.78. eGFR 72. Na/K 143/3.9. H/H 14/42. MCV 90. Platelets 306.  01/19/2019: Glucose 85. BUN/Cr 20/0.78. eGFR 72. Na/K 142/4.1. Rest of the CMP normal Chol 186, TG 132, HDL 59, LDL 101.  TSH 0.97 normal Occult fecal blood positive   Review of Systems  Cardiovascular:  Negative for chest pain, dyspnea on exertion, leg swelling, palpitations and syncope.        Vitals:   07/12/21 1020  BP: (!) 142/76  Pulse: 74  Resp: 16  Temp: 97.6 F (36.4 C)  SpO2: 97%    Body mass index is 25.4 kg/m. Filed Weights   07/12/21 1020  Weight: 148 lb (67.1 kg)     Objective:  Physical Exam Vitals and nursing note reviewed.  Constitutional:      General: She is not in acute distress.    Appearance: She is well-developed.  Neck:     Vascular: No JVD.  Cardiovascular:     Rate and Rhythm: Normal rate and regular rhythm. FrequentExtrasystoles are present.    Pulses: Intact distal pulses.     Heart sounds: Normal heart sounds. No murmur heard. Pulmonary:     Effort: Pulmonary effort is normal.     Breath sounds: Normal breath sounds. No wheezing or rales.  Musculoskeletal:     Right lower leg: No edema.     Left lower leg: No edema.          Assessment & Recommendations:    81 y.o. Caucasian female with hypertension,  hyperlipidemia, asthma, hypothyroidism, frequent PACs  Frequent PACs: Longstanding, asymptomatic.  No A. fib seen. Likely secondary to underlying asthma.  Hypertension: Generally well controlled.  Monitor for now. She has follow-up with PCP soon.  If blood pressure remains elevated, could add amlodipine 5 mg daily.  Pre-op risk stratification: Low risk for upcoming lacrimal duct surgery.    F/u in 1 year  Nigel Mormon, MD [facility] Pager: [phone] Office: [phone] If no answer Cell [phone]

## 2021-08-08 ENCOUNTER — Ambulatory Visit (HOSPITAL_COMMUNITY)
Admission: RE | Admit: 2021-08-08 | Discharge: 2021-08-08 | Disposition: A | Discharge status: Home / Self Care | Payer: Medicare Other | Source: Ambulatory Visit | Attending: Nephrology | Admitting: Nephrology

## 2021-08-08 VITALS — BP 134/96 | HR 82 | Temp 97.2°F | Resp 18

## 2021-08-08 DIAGNOSIS — N183 Chronic kidney disease, stage 3 unspecified: Secondary | ICD-10-CM | POA: Diagnosis not present

## 2021-08-08 DIAGNOSIS — D631 Anemia in chronic kidney disease: Secondary | ICD-10-CM | POA: Diagnosis not present

## 2021-08-08 LAB — IRON AND TIBC
Iron: 57 ug/dL (ref 28–170)
Saturation Ratios: 25 % (ref 10.4–31.8)
TIBC: 232 ug/dL — ABNORMAL LOW (ref 250–450)
UIBC: 175 ug/dL

## 2021-08-08 LAB — FERRITIN: Ferritin: 24 ng/mL (ref 11–307)

## 2021-08-08 LAB — POCT HEMOGLOBIN-HEMACUE: Hemoglobin: 10.3 g/dL — ABNORMAL LOW (ref 12.0–15.0)

## 2021-08-08 MED ORDER — DARBEPOETIN ALFA 40 MCG/0.4ML IJ SOSY: 40.0000 ug | PREFILLED_SYRINGE | INTRAMUSCULAR | Status: DC

## 2021-08-08 MED ORDER — DARBEPOETIN ALFA 40 MCG/0.4ML IJ SOSY
PREFILLED_SYRINGE | INTRAMUSCULAR | Status: AC
  Administered 2021-08-08: 40 ug via SUBCUTANEOUS
  Filled 2021-08-08: qty 0.4

## 2021-08-14 DIAGNOSIS — N184 Chronic kidney disease, stage 4 (severe): Secondary | ICD-10-CM | POA: Diagnosis not present

## 2021-08-15 DIAGNOSIS — R627 Adult failure to thrive: Secondary | ICD-10-CM | POA: Diagnosis not present

## 2021-08-15 DIAGNOSIS — I10 Essential (primary) hypertension: Secondary | ICD-10-CM | POA: Diagnosis not present

## 2021-08-15 DIAGNOSIS — L209 Atopic dermatitis, unspecified: Secondary | ICD-10-CM | POA: Diagnosis not present

## 2021-08-15 DIAGNOSIS — N1831 Chronic kidney disease, stage 3a: Secondary | ICD-10-CM | POA: Diagnosis not present

## 2021-08-15 DIAGNOSIS — N3946 Mixed incontinence: Secondary | ICD-10-CM | POA: Diagnosis not present

## 2021-08-15 DIAGNOSIS — F329 Major depressive disorder, single episode, unspecified: Secondary | ICD-10-CM | POA: Diagnosis not present

## 2021-08-17 DIAGNOSIS — N179 Acute kidney failure, unspecified: Secondary | ICD-10-CM | POA: Diagnosis not present

## 2021-08-17 DIAGNOSIS — E872 Acidosis, unspecified: Secondary | ICD-10-CM | POA: Diagnosis not present

## 2021-08-17 DIAGNOSIS — I129 Hypertensive chronic kidney disease with stage 1 through stage 4 chronic kidney disease, or unspecified chronic kidney disease: Secondary | ICD-10-CM | POA: Diagnosis not present

## 2021-08-17 DIAGNOSIS — D631 Anemia in chronic kidney disease: Secondary | ICD-10-CM | POA: Diagnosis not present

## 2021-08-17 DIAGNOSIS — N2581 Secondary hyperparathyroidism of renal origin: Secondary | ICD-10-CM | POA: Diagnosis not present

## 2021-08-17 DIAGNOSIS — N184 Chronic kidney disease, stage 4 (severe): Secondary | ICD-10-CM | POA: Diagnosis not present

## 2021-08-22 ENCOUNTER — Other Ambulatory Visit: Payer: Self-pay | Admitting: Ophthalmology

## 2021-08-22 DIAGNOSIS — H04561 Stenosis of right lacrimal punctum: Secondary | ICD-10-CM | POA: Diagnosis not present

## 2021-08-22 DIAGNOSIS — H04552 Acquired stenosis of left nasolacrimal duct: Secondary | ICD-10-CM | POA: Diagnosis not present

## 2021-08-22 DIAGNOSIS — H04412 Chronic dacryocystitis of left lacrimal passage: Secondary | ICD-10-CM | POA: Diagnosis not present

## 2021-08-22 DIAGNOSIS — H04222 Epiphora due to insufficient drainage, left lacrimal gland: Secondary | ICD-10-CM | POA: Diagnosis not present

## 2021-08-25 DIAGNOSIS — N184 Chronic kidney disease, stage 4 (severe): Secondary | ICD-10-CM | POA: Diagnosis not present

## 2021-09-05 ENCOUNTER — Ambulatory Visit (HOSPITAL_COMMUNITY)
Admission: RE | Admit: 2021-09-05 | Discharge: 2021-09-05 | Disposition: A | Discharge status: Home / Self Care | Payer: Medicare Other | Source: Ambulatory Visit | Attending: Nephrology | Admitting: Nephrology

## 2021-09-05 ENCOUNTER — Other Ambulatory Visit: Payer: Self-pay

## 2021-09-05 DIAGNOSIS — D631 Anemia in chronic kidney disease: Secondary | ICD-10-CM | POA: Insufficient documentation

## 2021-09-05 DIAGNOSIS — N183 Chronic kidney disease, stage 3 unspecified: Secondary | ICD-10-CM | POA: Insufficient documentation

## 2021-09-05 LAB — IRON AND TIBC
Iron: 65 ug/dL (ref 28–170)
Saturation Ratios: 31 % (ref 10.4–31.8)
TIBC: 213 ug/dL — ABNORMAL LOW (ref 250–450)
UIBC: 148 ug/dL

## 2021-09-05 LAB — POCT HEMOGLOBIN-HEMACUE: Hemoglobin: 10.1 g/dL — ABNORMAL LOW (ref 12.0–15.0)

## 2021-09-05 LAB — FERRITIN: Ferritin: 35 ng/mL (ref 11–307)

## 2021-09-05 MED ORDER — DARBEPOETIN ALFA 40 MCG/0.4ML IJ SOSY
PREFILLED_SYRINGE | INTRAMUSCULAR | Status: AC
  Filled 2021-09-05: qty 0.4

## 2021-09-05 MED ORDER — DARBEPOETIN ALFA 40 MCG/0.4ML IJ SOSY
40.0000 ug | PREFILLED_SYRINGE | INTRAMUSCULAR | Status: DC
  Administered 2021-09-05: 40 ug via SUBCUTANEOUS

## 2021-09-05 NOTE — Progress Notes (Signed)
Left upper chest PAC accessed with huber needle, site unremarkable, good blood return, flushed with 10cc NSS and heparin flush and huber needle removed intact ?

## 2021-09-07 DIAGNOSIS — H04552 Acquired stenosis of left nasolacrimal duct: Secondary | ICD-10-CM | POA: Diagnosis not present

## 2021-09-07 DIAGNOSIS — H04223 Epiphora due to insufficient drainage, bilateral lacrimal glands: Secondary | ICD-10-CM | POA: Diagnosis not present

## 2021-09-07 DIAGNOSIS — J3489 Other specified disorders of nose and nasal sinuses: Secondary | ICD-10-CM | POA: Diagnosis not present

## 2021-09-07 DIAGNOSIS — H02135 Senile ectropion of left lower eyelid: Secondary | ICD-10-CM | POA: Diagnosis not present

## 2021-09-07 DIAGNOSIS — C44319 Basal cell carcinoma of skin of other parts of face: Secondary | ICD-10-CM | POA: Diagnosis not present

## 2021-09-07 DIAGNOSIS — D485 Neoplasm of uncertain behavior of skin: Secondary | ICD-10-CM | POA: Diagnosis not present

## 2021-09-07 DIAGNOSIS — H02132 Senile ectropion of right lower eyelid: Secondary | ICD-10-CM | POA: Diagnosis not present

## 2021-09-07 DIAGNOSIS — L920 Granuloma annulare: Secondary | ICD-10-CM | POA: Diagnosis not present

## 2021-10-03 ENCOUNTER — Encounter (HOSPITAL_COMMUNITY): Payer: Medicare Other

## 2021-10-04 ENCOUNTER — Ambulatory Visit (HOSPITAL_COMMUNITY)
Admission: RE | Admit: 2021-10-04 | Discharge: 2021-10-04 | Disposition: A | Discharge status: Home / Self Care | Payer: Medicare Other | Source: Ambulatory Visit | Attending: Nephrology | Admitting: Nephrology

## 2021-10-04 VITALS — BP 128/78 | HR 84 | Temp 97.4°F | Resp 18

## 2021-10-04 DIAGNOSIS — N183 Chronic kidney disease, stage 3 unspecified: Secondary | ICD-10-CM | POA: Diagnosis not present

## 2021-10-04 DIAGNOSIS — D631 Anemia in chronic kidney disease: Secondary | ICD-10-CM | POA: Diagnosis not present

## 2021-10-04 LAB — IRON AND TIBC
Iron: 35 ug/dL (ref 28–170)
Saturation Ratios: 16 % (ref 10.4–31.8)
TIBC: 214 ug/dL — ABNORMAL LOW (ref 250–450)
UIBC: 179 ug/dL

## 2021-10-04 LAB — POCT HEMOGLOBIN-HEMACUE: Hemoglobin: 9.9 g/dL — ABNORMAL LOW (ref 12.0–15.0)

## 2021-10-04 LAB — FERRITIN: Ferritin: 30 ng/mL (ref 11–307)

## 2021-10-04 MED ORDER — DARBEPOETIN ALFA 40 MCG/0.4ML IJ SOSY
40.0000 ug | PREFILLED_SYRINGE | INTRAMUSCULAR | Status: DC
  Administered 2021-10-04: 40 ug via SUBCUTANEOUS

## 2021-10-04 MED ORDER — DARBEPOETIN ALFA 40 MCG/0.4ML IJ SOSY
PREFILLED_SYRINGE | INTRAMUSCULAR | Status: AC
  Filled 2021-10-04: qty 0.4

## 2021-10-25 DIAGNOSIS — Z91018 Allergy to other foods: Secondary | ICD-10-CM | POA: Diagnosis not present

## 2021-10-25 DIAGNOSIS — N184 Chronic kidney disease, stage 4 (severe): Secondary | ICD-10-CM | POA: Diagnosis not present

## 2021-10-25 DIAGNOSIS — T7800XD Anaphylactic reaction due to unspecified food, subsequent encounter: Secondary | ICD-10-CM | POA: Diagnosis not present

## 2021-10-25 DIAGNOSIS — K5904 Chronic idiopathic constipation: Secondary | ICD-10-CM | POA: Diagnosis not present

## 2021-10-25 DIAGNOSIS — E872 Acidosis, unspecified: Secondary | ICD-10-CM | POA: Diagnosis not present

## 2021-10-25 DIAGNOSIS — I129 Hypertensive chronic kidney disease with stage 1 through stage 4 chronic kidney disease, or unspecified chronic kidney disease: Secondary | ICD-10-CM | POA: Diagnosis not present

## 2021-10-25 DIAGNOSIS — F325 Major depressive disorder, single episode, in full remission: Secondary | ICD-10-CM | POA: Diagnosis not present

## 2021-10-25 DIAGNOSIS — M8668 Other chronic osteomyelitis, other site: Secondary | ICD-10-CM | POA: Diagnosis not present

## 2021-10-25 DIAGNOSIS — Z936 Other artificial openings of urinary tract status: Secondary | ICD-10-CM | POA: Diagnosis not present

## 2021-10-25 DIAGNOSIS — Z906 Acquired absence of other parts of urinary tract: Secondary | ICD-10-CM | POA: Diagnosis not present

## 2021-10-25 DIAGNOSIS — J4520 Mild intermittent asthma, uncomplicated: Secondary | ICD-10-CM | POA: Diagnosis not present

## 2021-10-25 DIAGNOSIS — L630 Alopecia (capitis) totalis: Secondary | ICD-10-CM | POA: Diagnosis not present

## 2021-11-01 ENCOUNTER — Ambulatory Visit (HOSPITAL_COMMUNITY)
Admission: RE | Admit: 2021-11-01 | Discharge: 2021-11-01 | Disposition: A | Discharge status: Home / Self Care | Payer: Medicare Other | Source: Ambulatory Visit | Attending: Nephrology | Admitting: Nephrology

## 2021-11-01 VITALS — BP 136/77 | HR 86 | Temp 98.1°F | Resp 18

## 2021-11-01 DIAGNOSIS — N183 Chronic kidney disease, stage 3 unspecified: Secondary | ICD-10-CM | POA: Diagnosis not present

## 2021-11-01 DIAGNOSIS — Z23 Encounter for immunization: Secondary | ICD-10-CM | POA: Diagnosis not present

## 2021-11-01 LAB — IRON AND TIBC
Iron: 27 ug/dL — ABNORMAL LOW (ref 28–170)
Saturation Ratios: 12 % (ref 10.4–31.8)
TIBC: 228 ug/dL — ABNORMAL LOW (ref 250–450)
UIBC: 201 ug/dL

## 2021-11-01 LAB — POCT HEMOGLOBIN-HEMACUE: Hemoglobin: 11.2 g/dL — ABNORMAL LOW (ref 12.0–15.0)

## 2021-11-01 LAB — FERRITIN: Ferritin: 34 ng/mL (ref 11–307)

## 2021-11-01 MED ORDER — DARBEPOETIN ALFA 40 MCG/0.4ML IJ SOSY: 40.0000 ug | PREFILLED_SYRINGE | INTRAMUSCULAR | Status: DC

## 2021-11-01 MED ORDER — DARBEPOETIN ALFA 40 MCG/0.4ML IJ SOSY
PREFILLED_SYRINGE | INTRAMUSCULAR | Status: AC
  Administered 2021-11-01: 40 ug via SUBCUTANEOUS
  Filled 2021-11-01: qty 0.4

## 2021-11-02 DIAGNOSIS — H02135 Senile ectropion of left lower eyelid: Secondary | ICD-10-CM | POA: Diagnosis not present

## 2021-11-02 DIAGNOSIS — H04552 Acquired stenosis of left nasolacrimal duct: Secondary | ICD-10-CM | POA: Diagnosis not present

## 2021-11-02 DIAGNOSIS — J3489 Other specified disorders of nose and nasal sinuses: Secondary | ICD-10-CM | POA: Diagnosis not present

## 2021-11-02 DIAGNOSIS — H02132 Senile ectropion of right lower eyelid: Secondary | ICD-10-CM | POA: Diagnosis not present

## 2021-11-02 DIAGNOSIS — H04223 Epiphora due to insufficient drainage, bilateral lacrimal glands: Secondary | ICD-10-CM | POA: Diagnosis not present

## 2021-11-10 DIAGNOSIS — H04553 Acquired stenosis of bilateral nasolacrimal duct: Secondary | ICD-10-CM | POA: Diagnosis not present

## 2021-11-10 DIAGNOSIS — H02132 Senile ectropion of right lower eyelid: Secondary | ICD-10-CM | POA: Diagnosis not present

## 2021-11-10 DIAGNOSIS — J3489 Other specified disorders of nose and nasal sinuses: Secondary | ICD-10-CM | POA: Diagnosis not present

## 2021-11-10 DIAGNOSIS — H02135 Senile ectropion of left lower eyelid: Secondary | ICD-10-CM | POA: Diagnosis not present

## 2021-11-10 DIAGNOSIS — H04221 Epiphora due to insufficient drainage, right lacrimal gland: Secondary | ICD-10-CM | POA: Diagnosis not present

## 2021-11-10 DIAGNOSIS — H04562 Stenosis of left lacrimal punctum: Secondary | ICD-10-CM | POA: Diagnosis not present

## 2021-11-29 ENCOUNTER — Ambulatory Visit (HOSPITAL_COMMUNITY)
Admission: RE | Admit: 2021-11-29 | Discharge: 2021-11-29 | Disposition: A | Discharge status: Home / Self Care | Payer: Medicare Other | Source: Ambulatory Visit | Attending: Nephrology | Admitting: Nephrology

## 2021-11-29 VITALS — BP 145/96 | HR 84 | Temp 97.2°F | Resp 18

## 2021-11-29 DIAGNOSIS — D631 Anemia in chronic kidney disease: Secondary | ICD-10-CM | POA: Diagnosis not present

## 2021-11-29 DIAGNOSIS — N183 Chronic kidney disease, stage 3 unspecified: Secondary | ICD-10-CM | POA: Insufficient documentation

## 2021-11-29 LAB — FERRITIN: Ferritin: 36 ng/mL (ref 11–307)

## 2021-11-29 LAB — IRON AND TIBC
Iron: 32 ug/dL (ref 28–170)
Saturation Ratios: 14 % (ref 10.4–31.8)
TIBC: 231 ug/dL — ABNORMAL LOW (ref 250–450)
UIBC: 199 ug/dL

## 2021-11-29 LAB — POCT HEMOGLOBIN-HEMACUE: Hemoglobin: 11.5 g/dL — ABNORMAL LOW (ref 12.0–15.0)

## 2021-11-29 MED ORDER — DARBEPOETIN ALFA 40 MCG/0.4ML IJ SOSY
40.0000 ug | PREFILLED_SYRINGE | INTRAMUSCULAR | Status: DC
  Administered 2021-11-29: 40 ug via SUBCUTANEOUS

## 2021-11-29 MED ORDER — DARBEPOETIN ALFA 40 MCG/0.4ML IJ SOSY
PREFILLED_SYRINGE | INTRAMUSCULAR | Status: AC
  Filled 2021-11-29: qty 0.4

## 2021-11-30 DIAGNOSIS — N184 Chronic kidney disease, stage 4 (severe): Secondary | ICD-10-CM | POA: Diagnosis not present

## 2021-12-06 DIAGNOSIS — N184 Chronic kidney disease, stage 4 (severe): Secondary | ICD-10-CM | POA: Diagnosis not present

## 2021-12-06 DIAGNOSIS — I129 Hypertensive chronic kidney disease with stage 1 through stage 4 chronic kidney disease, or unspecified chronic kidney disease: Secondary | ICD-10-CM | POA: Diagnosis not present

## 2021-12-06 DIAGNOSIS — D631 Anemia in chronic kidney disease: Secondary | ICD-10-CM | POA: Diagnosis not present

## 2021-12-06 DIAGNOSIS — E872 Acidosis, unspecified: Secondary | ICD-10-CM | POA: Diagnosis not present

## 2021-12-06 DIAGNOSIS — N2581 Secondary hyperparathyroidism of renal origin: Secondary | ICD-10-CM | POA: Diagnosis not present

## 2021-12-18 DIAGNOSIS — H04553 Acquired stenosis of bilateral nasolacrimal duct: Secondary | ICD-10-CM | POA: Diagnosis not present

## 2021-12-18 DIAGNOSIS — H04562 Stenosis of left lacrimal punctum: Secondary | ICD-10-CM | POA: Diagnosis not present

## 2021-12-18 DIAGNOSIS — H04221 Epiphora due to insufficient drainage, right lacrimal gland: Secondary | ICD-10-CM | POA: Diagnosis not present

## 2021-12-18 DIAGNOSIS — J3489 Other specified disorders of nose and nasal sinuses: Secondary | ICD-10-CM | POA: Diagnosis not present

## 2021-12-18 DIAGNOSIS — H02132 Senile ectropion of right lower eyelid: Secondary | ICD-10-CM | POA: Diagnosis not present

## 2021-12-18 DIAGNOSIS — H02135 Senile ectropion of left lower eyelid: Secondary | ICD-10-CM | POA: Diagnosis not present

## 2021-12-18 DIAGNOSIS — H04561 Stenosis of right lacrimal punctum: Secondary | ICD-10-CM | POA: Diagnosis not present

## 2021-12-18 DIAGNOSIS — H04223 Epiphora due to insufficient drainage, bilateral lacrimal glands: Secondary | ICD-10-CM | POA: Diagnosis not present

## 2021-12-25 DIAGNOSIS — Z936 Other artificial openings of urinary tract status: Secondary | ICD-10-CM | POA: Diagnosis not present

## 2021-12-25 DIAGNOSIS — R9431 Abnormal electrocardiogram [ECG] [EKG]: Secondary | ICD-10-CM | POA: Diagnosis not present

## 2021-12-25 DIAGNOSIS — M67432 Ganglion, left wrist: Secondary | ICD-10-CM | POA: Diagnosis not present

## 2021-12-25 DIAGNOSIS — J4520 Mild intermittent asthma, uncomplicated: Secondary | ICD-10-CM | POA: Diagnosis not present

## 2021-12-25 DIAGNOSIS — N184 Chronic kidney disease, stage 4 (severe): Secondary | ICD-10-CM | POA: Diagnosis not present

## 2021-12-25 DIAGNOSIS — B372 Candidiasis of skin and nail: Secondary | ICD-10-CM | POA: Diagnosis not present

## 2021-12-25 DIAGNOSIS — M8668 Other chronic osteomyelitis, other site: Secondary | ICD-10-CM | POA: Diagnosis not present

## 2021-12-25 DIAGNOSIS — E872 Acidosis, unspecified: Secondary | ICD-10-CM | POA: Diagnosis not present

## 2021-12-25 DIAGNOSIS — Z91018 Allergy to other foods: Secondary | ICD-10-CM | POA: Diagnosis not present

## 2021-12-27 ENCOUNTER — Ambulatory Visit (HOSPITAL_COMMUNITY)
Admission: RE | Admit: 2021-12-27 | Discharge: 2021-12-27 | Disposition: A | Discharge status: Home / Self Care | Payer: Medicare Other | Source: Ambulatory Visit | Attending: Nephrology | Admitting: Nephrology

## 2021-12-27 VITALS — BP 139/94 | HR 85 | Temp 97.3°F | Resp 18

## 2021-12-27 DIAGNOSIS — N183 Chronic kidney disease, stage 3 unspecified: Secondary | ICD-10-CM | POA: Insufficient documentation

## 2021-12-27 LAB — FERRITIN: Ferritin: 46 ng/mL (ref 11–307)

## 2021-12-27 LAB — POCT HEMOGLOBIN-HEMACUE: Hemoglobin: 11.4 g/dL — ABNORMAL LOW (ref 12.0–15.0)

## 2021-12-27 LAB — IRON AND TIBC
Iron: 44 ug/dL (ref 28–170)
Saturation Ratios: 23 % (ref 10.4–31.8)
TIBC: 190 ug/dL — ABNORMAL LOW (ref 250–450)
UIBC: 146 ug/dL

## 2021-12-27 MED ORDER — DARBEPOETIN ALFA 40 MCG/0.4ML IJ SOSY
PREFILLED_SYRINGE | INTRAMUSCULAR | Status: AC
  Filled 2021-12-27: qty 0.4

## 2021-12-27 MED ORDER — DARBEPOETIN ALFA 40 MCG/0.4ML IJ SOSY
40.0000 ug | PREFILLED_SYRINGE | INTRAMUSCULAR | Status: DC
  Administered 2021-12-27: 40 ug via SUBCUTANEOUS

## 2022-01-03 DIAGNOSIS — M79642 Pain in left hand: Secondary | ICD-10-CM | POA: Diagnosis not present

## 2022-01-03 DIAGNOSIS — M180 Bilateral primary osteoarthritis of first carpometacarpal joints: Secondary | ICD-10-CM | POA: Diagnosis not present

## 2022-01-03 DIAGNOSIS — M79641 Pain in right hand: Secondary | ICD-10-CM | POA: Diagnosis not present

## 2022-01-17 ENCOUNTER — Encounter (INDEPENDENT_AMBULATORY_CARE_PROVIDER_SITE_OTHER): Payer: Self-pay

## 2022-01-23 DIAGNOSIS — N184 Chronic kidney disease, stage 4 (severe): Secondary | ICD-10-CM | POA: Diagnosis not present

## 2022-01-24 ENCOUNTER — Ambulatory Visit (HOSPITAL_COMMUNITY)
Admission: RE | Admit: 2022-01-24 | Discharge: 2022-01-24 | Disposition: A | Discharge status: Home / Self Care | Payer: Medicare Other | Source: Ambulatory Visit | Attending: Nephrology | Admitting: Nephrology

## 2022-01-24 VITALS — BP 150/92 | HR 77 | Temp 96.4°F | Resp 18

## 2022-01-24 DIAGNOSIS — N183 Chronic kidney disease, stage 3 unspecified: Secondary | ICD-10-CM | POA: Diagnosis not present

## 2022-01-24 DIAGNOSIS — D631 Anemia in chronic kidney disease: Secondary | ICD-10-CM | POA: Diagnosis not present

## 2022-01-24 LAB — IRON AND TIBC
Iron: 42 ug/dL (ref 28–170)
Saturation Ratios: 19 % (ref 10.4–31.8)
TIBC: 223 ug/dL — ABNORMAL LOW (ref 250–450)
UIBC: 181 ug/dL

## 2022-01-24 LAB — POCT HEMOGLOBIN-HEMACUE: Hemoglobin: 10.7 g/dL — ABNORMAL LOW (ref 12.0–15.0)

## 2022-01-24 LAB — FERRITIN: Ferritin: 41 ng/mL (ref 11–307)

## 2022-01-24 MED ORDER — DARBEPOETIN ALFA 40 MCG/0.4ML IJ SOSY
PREFILLED_SYRINGE | INTRAMUSCULAR | Status: AC
  Filled 2022-01-24: qty 0.4

## 2022-01-24 MED ORDER — DARBEPOETIN ALFA 40 MCG/0.4ML IJ SOSY
40.0000 ug | PREFILLED_SYRINGE | INTRAMUSCULAR | Status: DC
  Administered 2022-01-24: 40 ug via SUBCUTANEOUS

## 2022-01-26 DIAGNOSIS — Z96642 Presence of left artificial hip joint: Secondary | ICD-10-CM | POA: Diagnosis not present

## 2022-01-26 DIAGNOSIS — Z96653 Presence of artificial knee joint, bilateral: Secondary | ICD-10-CM | POA: Diagnosis not present

## 2022-01-26 DIAGNOSIS — M25551 Pain in right hip: Secondary | ICD-10-CM | POA: Diagnosis not present

## 2022-01-29 DIAGNOSIS — I129 Hypertensive chronic kidney disease with stage 1 through stage 4 chronic kidney disease, or unspecified chronic kidney disease: Secondary | ICD-10-CM | POA: Diagnosis not present

## 2022-01-29 DIAGNOSIS — N2581 Secondary hyperparathyroidism of renal origin: Secondary | ICD-10-CM | POA: Diagnosis not present

## 2022-01-29 DIAGNOSIS — E872 Acidosis, unspecified: Secondary | ICD-10-CM | POA: Diagnosis not present

## 2022-01-29 DIAGNOSIS — D631 Anemia in chronic kidney disease: Secondary | ICD-10-CM | POA: Diagnosis not present

## 2022-01-29 DIAGNOSIS — N184 Chronic kidney disease, stage 4 (severe): Secondary | ICD-10-CM | POA: Diagnosis not present

## 2022-02-21 ENCOUNTER — Ambulatory Visit (HOSPITAL_COMMUNITY)
Admission: RE | Admit: 2022-02-21 | Discharge: 2022-02-21 | Disposition: A | Discharge status: Home / Self Care | Payer: Medicare Other | Source: Ambulatory Visit | Attending: Nephrology | Admitting: Nephrology

## 2022-02-21 VITALS — BP 148/83 | HR 77 | Temp 97.0°F | Resp 19

## 2022-02-21 DIAGNOSIS — N183 Chronic kidney disease, stage 3 unspecified: Secondary | ICD-10-CM | POA: Insufficient documentation

## 2022-02-21 DIAGNOSIS — I129 Hypertensive chronic kidney disease with stage 1 through stage 4 chronic kidney disease, or unspecified chronic kidney disease: Secondary | ICD-10-CM | POA: Insufficient documentation

## 2022-02-21 LAB — IRON AND TIBC
Iron: 45 ug/dL (ref 28–170)
Saturation Ratios: 22 % (ref 10.4–31.8)
TIBC: 202 ug/dL — ABNORMAL LOW (ref 250–450)
UIBC: 157 ug/dL

## 2022-02-21 LAB — FERRITIN: Ferritin: 39 ng/mL (ref 11–307)

## 2022-02-21 MED ORDER — DARBEPOETIN ALFA 40 MCG/0.4ML IJ SOSY
40.0000 ug | PREFILLED_SYRINGE | INTRAMUSCULAR | Status: DC
  Administered 2022-02-21: 40 ug via SUBCUTANEOUS

## 2022-02-21 MED ORDER — DARBEPOETIN ALFA 40 MCG/0.4ML IJ SOSY
PREFILLED_SYRINGE | INTRAMUSCULAR | Status: AC
  Filled 2022-02-21: qty 0.4

## 2022-02-22 LAB — POCT HEMOGLOBIN-HEMACUE: Hemoglobin: 11.4 g/dL — ABNORMAL LOW (ref 12.0–15.0)

## 2022-03-13 DIAGNOSIS — N184 Chronic kidney disease, stage 4 (severe): Secondary | ICD-10-CM | POA: Diagnosis not present

## 2022-03-14 DIAGNOSIS — Z87891 Personal history of nicotine dependence: Secondary | ICD-10-CM | POA: Diagnosis not present

## 2022-03-14 DIAGNOSIS — E039 Hypothyroidism, unspecified: Secondary | ICD-10-CM | POA: Diagnosis not present

## 2022-03-14 DIAGNOSIS — I129 Hypertensive chronic kidney disease with stage 1 through stage 4 chronic kidney disease, or unspecified chronic kidney disease: Secondary | ICD-10-CM | POA: Diagnosis not present

## 2022-03-14 DIAGNOSIS — I10 Essential (primary) hypertension: Secondary | ICD-10-CM | POA: Diagnosis not present

## 2022-03-14 DIAGNOSIS — Z0000 Encounter for general adult medical examination without abnormal findings: Secondary | ICD-10-CM | POA: Diagnosis not present

## 2022-03-14 DIAGNOSIS — D649 Anemia, unspecified: Secondary | ICD-10-CM | POA: Diagnosis not present

## 2022-03-14 DIAGNOSIS — K219 Gastro-esophageal reflux disease without esophagitis: Secondary | ICD-10-CM | POA: Diagnosis not present

## 2022-03-14 DIAGNOSIS — J4520 Mild intermittent asthma, uncomplicated: Secondary | ICD-10-CM | POA: Diagnosis not present

## 2022-03-14 DIAGNOSIS — N184 Chronic kidney disease, stage 4 (severe): Secondary | ICD-10-CM | POA: Diagnosis not present

## 2022-03-14 DIAGNOSIS — M8589 Other specified disorders of bone density and structure, multiple sites: Secondary | ICD-10-CM | POA: Diagnosis not present

## 2022-03-14 DIAGNOSIS — Z13220 Encounter for screening for lipoid disorders: Secondary | ICD-10-CM | POA: Diagnosis not present

## 2022-03-20 DIAGNOSIS — E872 Acidosis, unspecified: Secondary | ICD-10-CM | POA: Diagnosis not present

## 2022-03-20 DIAGNOSIS — I129 Hypertensive chronic kidney disease with stage 1 through stage 4 chronic kidney disease, or unspecified chronic kidney disease: Secondary | ICD-10-CM | POA: Diagnosis not present

## 2022-03-20 DIAGNOSIS — N184 Chronic kidney disease, stage 4 (severe): Secondary | ICD-10-CM | POA: Diagnosis not present

## 2022-03-20 DIAGNOSIS — D631 Anemia in chronic kidney disease: Secondary | ICD-10-CM | POA: Diagnosis not present

## 2022-03-20 DIAGNOSIS — N2581 Secondary hyperparathyroidism of renal origin: Secondary | ICD-10-CM | POA: Diagnosis not present

## 2022-03-21 ENCOUNTER — Ambulatory Visit (HOSPITAL_COMMUNITY)
Admission: RE | Admit: 2022-03-21 | Discharge: 2022-03-21 | Disposition: A | Discharge status: Home / Self Care | Payer: Medicare Other | Source: Ambulatory Visit | Attending: Nephrology | Admitting: Nephrology

## 2022-03-21 VITALS — BP 148/84 | HR 77 | Temp 96.9°F | Resp 15

## 2022-03-21 DIAGNOSIS — D631 Anemia in chronic kidney disease: Secondary | ICD-10-CM | POA: Insufficient documentation

## 2022-03-21 DIAGNOSIS — N183 Chronic kidney disease, stage 3 unspecified: Secondary | ICD-10-CM | POA: Insufficient documentation

## 2022-03-21 DIAGNOSIS — Z23 Encounter for immunization: Secondary | ICD-10-CM | POA: Diagnosis not present

## 2022-03-21 MED ORDER — DARBEPOETIN ALFA 40 MCG/0.4ML IJ SOSY
PREFILLED_SYRINGE | INTRAMUSCULAR | Status: AC
  Filled 2022-03-21: qty 0.4

## 2022-03-21 MED ORDER — DARBEPOETIN ALFA 40 MCG/0.4ML IJ SOSY
40.0000 ug | PREFILLED_SYRINGE | INTRAMUSCULAR | Status: DC
  Administered 2022-03-21: 40 ug via SUBCUTANEOUS

## 2022-03-22 LAB — POCT HEMOGLOBIN-HEMACUE: Hemoglobin: 10.8 g/dL — ABNORMAL LOW (ref 12.0–15.0)

## 2022-04-11 DIAGNOSIS — M180 Bilateral primary osteoarthritis of first carpometacarpal joints: Secondary | ICD-10-CM | POA: Diagnosis not present

## 2022-04-11 DIAGNOSIS — M1812 Unilateral primary osteoarthritis of first carpometacarpal joint, left hand: Secondary | ICD-10-CM | POA: Diagnosis not present

## 2022-04-18 ENCOUNTER — Ambulatory Visit (HOSPITAL_COMMUNITY)
Admission: RE | Admit: 2022-04-18 | Discharge: 2022-04-18 | Disposition: A | Discharge status: Home / Self Care | Payer: Medicare Other | Source: Ambulatory Visit | Attending: Nephrology | Admitting: Nephrology

## 2022-04-18 VITALS — BP 135/73 | HR 80 | Temp 98.4°F | Resp 17

## 2022-04-18 DIAGNOSIS — N183 Chronic kidney disease, stage 3 unspecified: Secondary | ICD-10-CM | POA: Insufficient documentation

## 2022-04-18 LAB — FERRITIN: Ferritin: 37 ng/mL (ref 11–307)

## 2022-04-18 LAB — IRON AND TIBC
Iron: 40 ug/dL (ref 28–170)
Saturation Ratios: 18 % (ref 10.4–31.8)
TIBC: 221 ug/dL — ABNORMAL LOW (ref 250–450)
UIBC: 181 ug/dL

## 2022-04-18 LAB — POCT HEMOGLOBIN-HEMACUE: Hemoglobin: 11.1 g/dL — ABNORMAL LOW (ref 12.0–15.0)

## 2022-04-18 MED ORDER — DARBEPOETIN ALFA 40 MCG/0.4ML IJ SOSY: 40.0000 ug | PREFILLED_SYRINGE | INTRAMUSCULAR | Status: DC

## 2022-04-18 MED ORDER — DARBEPOETIN ALFA 40 MCG/0.4ML IJ SOSY
PREFILLED_SYRINGE | INTRAMUSCULAR | Status: AC
  Administered 2022-04-18: 40 ug via SUBCUTANEOUS
  Filled 2022-04-18: qty 0.4

## 2022-05-16 ENCOUNTER — Ambulatory Visit (HOSPITAL_COMMUNITY)
Admission: RE | Admit: 2022-05-16 | Discharge: 2022-05-16 | Disposition: A | Discharge status: Home / Self Care | Payer: Medicare Other | Source: Ambulatory Visit | Attending: Nephrology | Admitting: Nephrology

## 2022-05-16 VITALS — BP 154/84 | HR 108 | Temp 97.7°F | Resp 18

## 2022-05-16 DIAGNOSIS — N183 Chronic kidney disease, stage 3 unspecified: Secondary | ICD-10-CM | POA: Insufficient documentation

## 2022-05-16 LAB — IRON AND TIBC
Iron: 45 ug/dL (ref 28–170)
Saturation Ratios: 20 % (ref 10.4–31.8)
TIBC: 221 ug/dL — ABNORMAL LOW (ref 250–450)
UIBC: 176 ug/dL

## 2022-05-16 LAB — POCT HEMOGLOBIN-HEMACUE: Hemoglobin: 11.7 g/dL — ABNORMAL LOW (ref 12.0–15.0)

## 2022-05-16 LAB — FERRITIN: Ferritin: 46 ng/mL (ref 11–307)

## 2022-05-16 MED ORDER — DARBEPOETIN ALFA 40 MCG/0.4ML IJ SOSY
PREFILLED_SYRINGE | INTRAMUSCULAR | Status: AC
  Filled 2022-05-16: qty 0.4

## 2022-05-16 MED ORDER — DARBEPOETIN ALFA 40 MCG/0.4ML IJ SOSY
40.0000 ug | PREFILLED_SYRINGE | INTRAMUSCULAR | Status: DC
  Administered 2022-05-16: 40 ug via SUBCUTANEOUS

## 2022-05-17 DIAGNOSIS — Z9889 Other specified postprocedural states: Secondary | ICD-10-CM | POA: Diagnosis not present

## 2022-05-17 DIAGNOSIS — N1339 Other hydronephrosis: Secondary | ICD-10-CM | POA: Diagnosis not present

## 2022-05-23 DIAGNOSIS — R634 Abnormal weight loss: Secondary | ICD-10-CM | POA: Diagnosis not present

## 2022-05-23 DIAGNOSIS — M8668 Other chronic osteomyelitis, other site: Secondary | ICD-10-CM | POA: Diagnosis not present

## 2022-05-23 DIAGNOSIS — Z87898 Personal history of other specified conditions: Secondary | ICD-10-CM | POA: Diagnosis not present

## 2022-05-23 DIAGNOSIS — E872 Acidosis, unspecified: Secondary | ICD-10-CM | POA: Diagnosis not present

## 2022-05-23 DIAGNOSIS — R630 Anorexia: Secondary | ICD-10-CM | POA: Diagnosis not present

## 2022-05-23 DIAGNOSIS — Z6823 Body mass index (BMI) 23.0-23.9, adult: Secondary | ICD-10-CM | POA: Diagnosis not present

## 2022-05-23 DIAGNOSIS — N184 Chronic kidney disease, stage 4 (severe): Secondary | ICD-10-CM | POA: Diagnosis not present

## 2022-05-23 DIAGNOSIS — K219 Gastro-esophageal reflux disease without esophagitis: Secondary | ICD-10-CM | POA: Diagnosis not present

## 2022-05-29 DIAGNOSIS — I209 Angina pectoris, unspecified: Secondary | ICD-10-CM | POA: Diagnosis not present

## 2022-05-29 DIAGNOSIS — N184 Chronic kidney disease, stage 4 (severe): Secondary | ICD-10-CM | POA: Diagnosis not present

## 2022-05-29 DIAGNOSIS — R627 Adult failure to thrive: Secondary | ICD-10-CM | POA: Diagnosis not present

## 2022-05-29 DIAGNOSIS — R42 Dizziness and giddiness: Secondary | ICD-10-CM | POA: Diagnosis not present

## 2022-05-29 DIAGNOSIS — R000 Tachycardia, unspecified: Secondary | ICD-10-CM | POA: Diagnosis not present

## 2022-05-29 DIAGNOSIS — Z7409 Other reduced mobility: Secondary | ICD-10-CM | POA: Diagnosis not present

## 2022-05-29 DIAGNOSIS — I4891 Unspecified atrial fibrillation: Secondary | ICD-10-CM | POA: Diagnosis not present

## 2022-05-29 DIAGNOSIS — Z79890 Hormone replacement therapy (postmenopausal): Secondary | ICD-10-CM | POA: Diagnosis not present

## 2022-05-29 DIAGNOSIS — R002 Palpitations: Secondary | ICD-10-CM | POA: Diagnosis not present

## 2022-05-29 DIAGNOSIS — I4719 Other supraventricular tachycardia: Secondary | ICD-10-CM | POA: Diagnosis not present

## 2022-05-29 DIAGNOSIS — I959 Hypotension, unspecified: Secondary | ICD-10-CM | POA: Diagnosis not present

## 2022-05-29 DIAGNOSIS — I6782 Cerebral ischemia: Secondary | ICD-10-CM | POA: Diagnosis not present

## 2022-05-29 DIAGNOSIS — I951 Orthostatic hypotension: Secondary | ICD-10-CM | POA: Diagnosis not present

## 2022-05-29 DIAGNOSIS — I491 Atrial premature depolarization: Secondary | ICD-10-CM | POA: Diagnosis not present

## 2022-05-29 DIAGNOSIS — N179 Acute kidney failure, unspecified: Secondary | ICD-10-CM | POA: Diagnosis not present

## 2022-05-29 DIAGNOSIS — R110 Nausea: Secondary | ICD-10-CM | POA: Diagnosis not present

## 2022-05-29 DIAGNOSIS — N3000 Acute cystitis without hematuria: Secondary | ICD-10-CM | POA: Diagnosis not present

## 2022-05-29 DIAGNOSIS — J45909 Unspecified asthma, uncomplicated: Secondary | ICD-10-CM | POA: Diagnosis not present

## 2022-05-29 DIAGNOSIS — I493 Ventricular premature depolarization: Secondary | ICD-10-CM | POA: Diagnosis not present

## 2022-05-29 DIAGNOSIS — Z9049 Acquired absence of other specified parts of digestive tract: Secondary | ICD-10-CM | POA: Diagnosis not present

## 2022-05-29 DIAGNOSIS — I10 Essential (primary) hypertension: Secondary | ICD-10-CM | POA: Diagnosis not present

## 2022-05-29 DIAGNOSIS — Z789 Other specified health status: Secondary | ICD-10-CM | POA: Diagnosis not present

## 2022-05-29 DIAGNOSIS — I129 Hypertensive chronic kidney disease with stage 1 through stage 4 chronic kidney disease, or unspecified chronic kidney disease: Secondary | ICD-10-CM | POA: Diagnosis not present

## 2022-05-29 DIAGNOSIS — Z881 Allergy status to other antibiotic agents status: Secondary | ICD-10-CM | POA: Diagnosis not present

## 2022-05-29 DIAGNOSIS — R21 Rash and other nonspecific skin eruption: Secondary | ICD-10-CM | POA: Diagnosis not present

## 2022-05-29 DIAGNOSIS — R531 Weakness: Secondary | ICD-10-CM | POA: Diagnosis not present

## 2022-05-29 DIAGNOSIS — E7800 Pure hypercholesterolemia, unspecified: Secondary | ICD-10-CM | POA: Diagnosis not present

## 2022-05-29 DIAGNOSIS — I498 Other specified cardiac arrhythmias: Secondary | ICD-10-CM | POA: Diagnosis not present

## 2022-05-29 DIAGNOSIS — R9431 Abnormal electrocardiogram [ECG] [EKG]: Secondary | ICD-10-CM | POA: Diagnosis not present

## 2022-05-29 DIAGNOSIS — Z87891 Personal history of nicotine dependence: Secondary | ICD-10-CM | POA: Diagnosis not present

## 2022-06-05 DIAGNOSIS — D649 Anemia, unspecified: Secondary | ICD-10-CM | POA: Diagnosis not present

## 2022-06-07 DIAGNOSIS — J4520 Mild intermittent asthma, uncomplicated: Secondary | ICD-10-CM | POA: Diagnosis not present

## 2022-06-07 DIAGNOSIS — R531 Weakness: Secondary | ICD-10-CM | POA: Diagnosis not present

## 2022-06-07 DIAGNOSIS — R634 Abnormal weight loss: Secondary | ICD-10-CM | POA: Diagnosis not present

## 2022-06-07 DIAGNOSIS — Z87898 Personal history of other specified conditions: Secondary | ICD-10-CM | POA: Diagnosis not present

## 2022-06-07 DIAGNOSIS — N3000 Acute cystitis without hematuria: Secondary | ICD-10-CM | POA: Diagnosis not present

## 2022-06-07 DIAGNOSIS — M8668 Other chronic osteomyelitis, other site: Secondary | ICD-10-CM | POA: Diagnosis not present

## 2022-06-13 ENCOUNTER — Encounter (HOSPITAL_COMMUNITY)
Admission: RE | Admit: 2022-06-13 | Discharge: 2022-06-13 | Disposition: A | Discharge status: Home / Self Care | Payer: Medicare Other | Source: Ambulatory Visit | Attending: Nephrology | Admitting: Nephrology

## 2022-06-13 VITALS — BP 118/82 | HR 109 | Temp 97.4°F | Resp 18

## 2022-06-13 DIAGNOSIS — N183 Chronic kidney disease, stage 3 unspecified: Secondary | ICD-10-CM | POA: Diagnosis not present

## 2022-06-13 DIAGNOSIS — Z1231 Encounter for screening mammogram for malignant neoplasm of breast: Secondary | ICD-10-CM | POA: Diagnosis not present

## 2022-06-13 LAB — IRON AND TIBC
Iron: 40 ug/dL (ref 28–170)
Saturation Ratios: 18 % (ref 10.4–31.8)
TIBC: 218 ug/dL — ABNORMAL LOW (ref 250–450)
UIBC: 178 ug/dL

## 2022-06-13 LAB — FERRITIN: Ferritin: 45 ng/mL (ref 11–307)

## 2022-06-13 LAB — POCT HEMOGLOBIN-HEMACUE: Hemoglobin: 10.6 g/dL — ABNORMAL LOW (ref 12.0–15.0)

## 2022-06-13 MED ORDER — DARBEPOETIN ALFA 40 MCG/0.4ML IJ SOSY
PREFILLED_SYRINGE | INTRAMUSCULAR | Status: AC
  Administered 2022-06-13: 40 ug via SUBCUTANEOUS
  Filled 2022-06-13: qty 0.4

## 2022-06-13 MED ORDER — DARBEPOETIN ALFA 40 MCG/0.4ML IJ SOSY: 40.0000 ug | PREFILLED_SYRINGE | INTRAMUSCULAR | Status: DC

## 2022-06-14 DIAGNOSIS — R627 Adult failure to thrive: Secondary | ICD-10-CM | POA: Diagnosis not present

## 2022-06-14 DIAGNOSIS — R002 Palpitations: Secondary | ICD-10-CM | POA: Diagnosis not present

## 2022-06-14 DIAGNOSIS — R634 Abnormal weight loss: Secondary | ICD-10-CM | POA: Diagnosis not present

## 2022-06-14 DIAGNOSIS — R9431 Abnormal electrocardiogram [ECG] [EKG]: Secondary | ICD-10-CM | POA: Diagnosis not present

## 2022-06-14 DIAGNOSIS — I951 Orthostatic hypotension: Secondary | ICD-10-CM | POA: Diagnosis not present

## 2022-06-14 DIAGNOSIS — M8668 Other chronic osteomyelitis, other site: Secondary | ICD-10-CM | POA: Diagnosis not present

## 2022-06-14 DIAGNOSIS — R110 Nausea: Secondary | ICD-10-CM | POA: Diagnosis not present

## 2022-06-14 DIAGNOSIS — N184 Chronic kidney disease, stage 4 (severe): Secondary | ICD-10-CM | POA: Diagnosis not present

## 2022-06-19 DIAGNOSIS — N184 Chronic kidney disease, stage 4 (severe): Secondary | ICD-10-CM | POA: Diagnosis not present

## 2022-06-21 DIAGNOSIS — M810 Age-related osteoporosis without current pathological fracture: Secondary | ICD-10-CM | POA: Diagnosis not present

## 2022-06-21 DIAGNOSIS — M8589 Other specified disorders of bone density and structure, multiple sites: Secondary | ICD-10-CM | POA: Diagnosis not present

## 2022-06-21 DIAGNOSIS — Z13820 Encounter for screening for osteoporosis: Secondary | ICD-10-CM | POA: Diagnosis not present

## 2022-06-25 DIAGNOSIS — I129 Hypertensive chronic kidney disease with stage 1 through stage 4 chronic kidney disease, or unspecified chronic kidney disease: Secondary | ICD-10-CM | POA: Diagnosis not present

## 2022-06-25 DIAGNOSIS — N184 Chronic kidney disease, stage 4 (severe): Secondary | ICD-10-CM | POA: Diagnosis not present

## 2022-06-25 DIAGNOSIS — N2581 Secondary hyperparathyroidism of renal origin: Secondary | ICD-10-CM | POA: Diagnosis not present

## 2022-06-25 DIAGNOSIS — E872 Acidosis, unspecified: Secondary | ICD-10-CM | POA: Diagnosis not present

## 2022-06-25 DIAGNOSIS — D631 Anemia in chronic kidney disease: Secondary | ICD-10-CM | POA: Diagnosis not present

## 2022-06-26 DIAGNOSIS — N1330 Unspecified hydronephrosis: Secondary | ICD-10-CM | POA: Diagnosis not present

## 2022-06-26 DIAGNOSIS — Z9889 Other specified postprocedural states: Secondary | ICD-10-CM | POA: Diagnosis not present

## 2022-06-26 DIAGNOSIS — N1339 Other hydronephrosis: Secondary | ICD-10-CM | POA: Diagnosis not present

## 2022-07-11 ENCOUNTER — Encounter (HOSPITAL_COMMUNITY)
Admission: RE | Admit: 2022-07-11 | Discharge: 2022-07-11 | Disposition: A | Discharge status: Home / Self Care | Payer: Medicare Other | Source: Ambulatory Visit | Attending: Nephrology | Admitting: Nephrology

## 2022-07-11 VITALS — BP 144/91 | HR 89 | Temp 97.0°F | Resp 18

## 2022-07-11 DIAGNOSIS — N183 Chronic kidney disease, stage 3 unspecified: Secondary | ICD-10-CM

## 2022-07-11 LAB — POCT HEMOGLOBIN-HEMACUE: Hemoglobin: 11.3 g/dL — ABNORMAL LOW (ref 12.0–15.0)

## 2022-07-11 MED ORDER — DARBEPOETIN ALFA 40 MCG/0.4ML IJ SOSY
PREFILLED_SYRINGE | INTRAMUSCULAR | Status: AC
  Filled 2022-07-11: qty 0.4

## 2022-07-11 MED ORDER — DARBEPOETIN ALFA 40 MCG/0.4ML IJ SOSY
40.0000 ug | PREFILLED_SYRINGE | INTRAMUSCULAR | Status: DC
  Administered 2022-07-11: 40 ug via SUBCUTANEOUS

## 2022-07-12 ENCOUNTER — Encounter: Payer: Self-pay | Admitting: Cardiology

## 2022-07-12 ENCOUNTER — Ambulatory Visit: Payer: Medicare Other | Admitting: Cardiology

## 2022-07-12 VITALS — BP 160/63 | HR 83 | Resp 16 | Ht 64.0 in | Wt 144.0 lb

## 2022-07-12 DIAGNOSIS — R9431 Abnormal electrocardiogram [ECG] [EKG]: Secondary | ICD-10-CM | POA: Diagnosis not present

## 2022-07-12 DIAGNOSIS — I10 Essential (primary) hypertension: Secondary | ICD-10-CM

## 2022-07-12 DIAGNOSIS — I4719 Other supraventricular tachycardia: Secondary | ICD-10-CM

## 2022-07-12 DIAGNOSIS — I491 Atrial premature depolarization: Secondary | ICD-10-CM

## 2022-07-12 MED ORDER — METOPROLOL TARTRATE 25 MG PO TABS: 25.0000 mg | ORAL_TABLET | Freq: Two times a day (BID) | ORAL | 3 refills | Status: DC

## 2022-07-12 NOTE — Progress Notes (Signed)
Patient referred by [name], MD for irregular heart rhythm  Subjective:   Linda Rice, female    DOB: 1941-01-20, 82 y.o.   MRN: [id number]   Chief Complaint  Patient presents with   [facility]   Follow-up    1 year     HPI  82 y.o. Caucasian female with hypertension, hyperlipidemia, asthma, hypothyroidism, frequent PACs  Patient has recently had progressively worsening CKD, for which she is seeing Dr. Candiss Norse.  They are contemplating placement of AV fistula/graft or vascular surgery.  Patient is emotional about progression of her kidney disease.  She denies any chest pain or shortness of breath, but has felt episodes of dizziness.  Blood pressure is elevated today.   Current Outpatient Medications:    albuterol (VENTOLIN HFA) 108 (90 Base) MCG/ACT inhaler, Inhale 1-2 puffs into the lungs every 6 (six) hours as needed for wheezing or shortness of breath., Disp: 18 g, Rfl: 1   atorvastatin (LIPITOR) 10 MG tablet, Take 10 mg by mouth daily., Disp: , Rfl:    CALCIUM CITRATE PO, Take 500 mg by mouth daily., Disp: , Rfl:    Cholecalciferol (VITAMIN D3) 125 MCG (5000 UT) CAPS, Take 1 capsule by mouth every 7 (seven) days., Disp: , Rfl:    doxycycline (VIBRAMYCIN) 100 MG capsule, Take 100 mg by mouth 2 (two) times daily., Disp: , Rfl: 4   EPINEPHrine 0.3 mg/0.3 mL IJ SOAJ injection, Inject 0.3 mLs (0.3 mg total) into the muscle as needed for anaphylaxis., Disp: 2 each, Rfl: 1   famotidine (PEPCID) 40 MG tablet, Take 40 mg by mouth daily., Disp: , Rfl:    FLUoxetine (PROZAC) 10 MG capsule, Take 1 capsule (10 mg total) by mouth daily., Disp: 30 capsule, Rfl: 3   Levothyroxine Sodium 88 MCG CAPS, Take 88 mcg by mouth daily before breakfast., Disp: , Rfl:    mirtazapine (REMERON) 15 MG tablet, Take 15 mg by mouth at bedtime., Disp: , Rfl:    OLANZapine (ZYPREXA) 2.5 MG tablet, Take 2.5 mg by mouth daily., Disp: , Rfl:    senna (SENOKOT) 8.6 MG TABS tablet, 2 tablets at bedtime as  needed, Disp: , Rfl:    sodium bicarbonate 650 MG tablet, Take 1,300 mg by mouth 3 (three) times daily., Disp: , Rfl:   Cardiovascular studies:  EKG 07/12/2021: Sinus rhythm 66 bpm Frequent PACs  Poor R-wave progression Cannot exclude old anterior infarct  Low voltage  Echocardiogram 06/15/2020:  Normal LV systolic function with visual EF 65-70%. Left ventricle cavity  is normal in size. Normal global wall motion. Normal diastolic filling  pattern, normal LAP.  Mild (Grade I) aortic regurgitation. Aortic valve sclerosis without  stenosis.  Mild (Grade I) mitral regurgitation.  Mild tricuspid regurgitation. No evidence of pulmonary hypertension.  Compared to prior study dated 06/23/2018 no significant changes.   Recent labs: 06/14/2022: Glucose 82, BUN/Cr 35/2.25. EGFR 22. Na/K 139/4.4.  H/H 10/31. MCV 86. Platelets 320  01/19/2019: Glucose 85. BUN/Cr 20/0.78. eGFR 72. Na/K 142/4.1. Rest of the CMP normal Chol 186, TG 132, HDL 59, LDL 101.  TSH 0.97 normal Occult fecal blood positive   Review of Systems  Cardiovascular:  Negative for chest pain, dyspnea on exertion, leg swelling, palpitations and syncope.  Neurological:  Positive for dizziness.         Vitals:   07/12/22 1023 07/12/22 1024  BP:    Pulse:    Resp:    SpO2: 98% 98%  Orthostatic VS for the past 72 hrs (Last 3 readings):  Orthostatic BP Patient Position BP Location Cuff Size Orthostatic Pulse  07/12/22 1024 148/70 Standing Left Arm Normal 72  07/12/22 1023 (!) 161/109 Sitting Left Arm Normal 106  07/12/22 1022 (!) 153/91 Supine Left Arm Normal 76     Body mass index is 24.72 kg/m. Filed Weights   07/12/22 1005  Weight: 144 lb (65.3 kg)     Objective:   Physical Exam Vitals and nursing note reviewed.  Constitutional:      General: She is not in acute distress.    Appearance: She is well-developed.  Neck:     Vascular: No JVD.  Cardiovascular:     Rate and Rhythm: Regular rhythm.  Tachycardia present. Frequent Extrasystoles are present.    Pulses: Intact distal pulses.     Heart sounds: Normal heart sounds. No murmur heard. Pulmonary:     Effort: Pulmonary effort is normal.     Breath sounds: Normal breath sounds. No wheezing or rales.  Musculoskeletal:     Right lower leg: No edema.     Left lower leg: No edema.           Assessment & Recommendations:    82 y.o. Caucasian female with hypertension, hyperlipidemia, asthma, hypothyroidism, frequent PACs/MAT, CKD IV  Frequent PACs/MAT: Longstanding, asymptomatic.  No A. fib seen. Likely secondary to underlying asthma.  Hypertension: Blood pressure elevated today.  Orthostatics negative.  Suspect dizziness could be related to hypertension. Start metoprolol tartrate 25 mg twice daily.   Will check echocardiogram.  F/u in 4 to 6 weeks to follow-up on hypertension    Linda Mormon, MD Pager: [phone] Office: [phone]

## 2022-07-13 DIAGNOSIS — Z7189 Other specified counseling: Secondary | ICD-10-CM | POA: Diagnosis not present

## 2022-07-13 DIAGNOSIS — M810 Age-related osteoporosis without current pathological fracture: Secondary | ICD-10-CM | POA: Diagnosis not present

## 2022-07-13 DIAGNOSIS — K219 Gastro-esophageal reflux disease without esophagitis: Secondary | ICD-10-CM | POA: Diagnosis not present

## 2022-07-13 DIAGNOSIS — N184 Chronic kidney disease, stage 4 (severe): Secondary | ICD-10-CM | POA: Diagnosis not present

## 2022-07-13 DIAGNOSIS — Z87311 Personal history of (healed) other pathological fracture: Secondary | ICD-10-CM | POA: Diagnosis not present

## 2022-07-16 ENCOUNTER — Other Ambulatory Visit: Payer: Self-pay

## 2022-07-16 ENCOUNTER — Ambulatory Visit (INDEPENDENT_AMBULATORY_CARE_PROVIDER_SITE_OTHER)
Admission: RE | Admit: 2022-07-16 | Discharge: 2022-07-16 | Disposition: A | Discharge status: Home / Self Care | Payer: Medicare Other | Source: Ambulatory Visit | Attending: Surgery | Admitting: Surgery

## 2022-07-16 ENCOUNTER — Ambulatory Visit (HOSPITAL_COMMUNITY)
Admission: RE | Admit: 2022-07-16 | Discharge: 2022-07-16 | Disposition: A | Discharge status: Home / Self Care | Payer: Medicare Other | Source: Ambulatory Visit | Attending: Surgery | Admitting: Surgery

## 2022-07-16 DIAGNOSIS — N183 Chronic kidney disease, stage 3 unspecified: Secondary | ICD-10-CM

## 2022-07-16 DIAGNOSIS — R627 Adult failure to thrive: Secondary | ICD-10-CM | POA: Diagnosis not present

## 2022-07-16 DIAGNOSIS — N184 Chronic kidney disease, stage 4 (severe): Secondary | ICD-10-CM | POA: Diagnosis not present

## 2022-07-16 DIAGNOSIS — I129 Hypertensive chronic kidney disease with stage 1 through stage 4 chronic kidney disease, or unspecified chronic kidney disease: Secondary | ICD-10-CM | POA: Diagnosis not present

## 2022-07-16 DIAGNOSIS — F325 Major depressive disorder, single episode, in full remission: Secondary | ICD-10-CM | POA: Diagnosis not present

## 2022-07-16 DIAGNOSIS — R634 Abnormal weight loss: Secondary | ICD-10-CM | POA: Diagnosis not present

## 2022-07-16 DIAGNOSIS — M810 Age-related osteoporosis without current pathological fracture: Secondary | ICD-10-CM | POA: Diagnosis not present

## 2022-07-16 DIAGNOSIS — Z936 Other artificial openings of urinary tract status: Secondary | ICD-10-CM | POA: Diagnosis not present

## 2022-07-16 DIAGNOSIS — Z87891 Personal history of nicotine dependence: Secondary | ICD-10-CM | POA: Diagnosis not present

## 2022-07-17 ENCOUNTER — Encounter: Payer: Self-pay | Admitting: Vascular Surgery

## 2022-07-17 ENCOUNTER — Other Ambulatory Visit: Payer: Self-pay | Admitting: *Deleted

## 2022-07-17 ENCOUNTER — Ambulatory Visit (INDEPENDENT_AMBULATORY_CARE_PROVIDER_SITE_OTHER): Payer: Medicare Other | Admitting: Vascular Surgery

## 2022-07-17 ENCOUNTER — Encounter: Payer: Self-pay | Admitting: *Deleted

## 2022-07-17 VITALS — BP 148/93 | HR 65 | Temp 97.1°F | Resp 14 | Ht 64.0 in | Wt 143.0 lb

## 2022-07-17 DIAGNOSIS — N184 Chronic kidney disease, stage 4 (severe): Secondary | ICD-10-CM

## 2022-07-17 DIAGNOSIS — N183 Chronic kidney disease, stage 3 unspecified: Secondary | ICD-10-CM

## 2022-07-17 NOTE — Progress Notes (Signed)
Patient name: Linda Rice MRN: [id number] DOB: 1940/10/05 Sex: female  REASON FOR CONSULT: New access placement  HPI: Linda Rice is a 82 y.o. female, with hx HTN, HLD, cystectomy with ileal conduit diversion, GERD, CKD stage 4 who presents for evaluation of permanent dialysis access placement.  Patient states her chronic kidney disease is related to her previous bladder infection.  Patient is right-handed.  She has had no dialysis access in the past.  No chest wall implants.  She is here with her daughter.  Not on dialysis at this time.  The notes from [facility] AV fistula now and wait on AV graft.  Past Medical History:  Diagnosis Date   Alopecia    Anxiety    Arthritis    Aspergillosis (HCC)    Asthma    Atopic dermatitis    Bladder incontinence    Complication of anesthesia, mood lability    Depression, situational    Follicular cystitis    History of bilateral knee replacement    History of left hip replacement    History of transfusion    Hyperlipidemia    Hypertension    Hypothyroidism    Lactose intolerance    Multiple food allergies    Osteoarthritis    Osteopenia    Pneumonia    PVC (premature ventricular contraction)    Shortness of breath    Urticaria    Vitamin D deficiency     Past Surgical History:  Procedure Laterality Date   ADENOIDECTOMY     APPENDECTOMY     BLADDER REMOVAL     BREAST EXCISIONAL BIOPSY Right 1990   BROW LIFT  2020   CARPAL TUNNEL RELEASE     CHOLECYSTECTOMY     COLONOSCOPY W/ POLYPECTOMY     COLONOSCOPY WITH PROPOFOL N/A 03/24/2019   Procedure: COLONOSCOPY WITH PROPOFOL;  Surgeon: Clarene Essex, MD;  Location: [place];  Service: Endoscopy;  Laterality: N/A;   EYELID REPAIR W/ SKIN GRAFT     LEFT OOPHORECTOMY     POLYPECTOMY  03/24/2019   Procedure: POLYPECTOMY;  Surgeon: Clarene Essex, MD;  Location: [place];  Service: Endoscopy;;   REPLACEMENT TOTAL KNEE BILATERAL      TONSILLECTOMY     TOTAL HIP ARTHROPLASTY Left 09/05/2015   Procedure: LEFT TOTAL HIP ARTHROPLASTY ANTERIOR APPROACH;  Surgeon: [name], MD;  Location: [facility];  Service: Orthopedics;  Laterality: Left;   TUBAL LIGATION      Family History  Problem Relation Age of Onset   Diabetes Mother    Heart disease Mother    Hyperlipidemia Mother    Hypertension Mother    Stroke Mother    Anxiety disorder Mother    Obesity Mother    Hyperlipidemia Father    Heart disease Father    Allergic rhinitis Father    Hyperlipidemia Sister    Breast cancer Maternal Aunt    Asthma Maternal Grandfather    Breast cancer Cousin     SOCIAL HISTORY: Social History   Socioeconomic History   Marital status: Divorced    Spouse name: Not on file   Number of children: 4   Years of education: Not on file   Highest education level: Not on file  Occupational History   Not on file  Tobacco Use   Smoking status: Former    Packs/day: 0.50    Years: 4.00    Total pack years: 2.00    Types: Cigarettes  Quit date: 81    Years since quitting: 43.1   Smokeless tobacco: Never   Tobacco comments:    1 pkg a week  Vaping Use   Vaping Use: Never used  Substance and Sexual Activity   Alcohol use: Not Currently    Alcohol/week: 2.0 standard drinks of alcohol    Types: 1 Glasses of wine, 1 Standard drinks or equivalent per week   Drug use: No   Sexual activity: Not on file  Other Topics Concern   Not on file  Social History Narrative   Not on file   Social Determinants of Health   Financial Resource Strain: Not on file  Food Insecurity: Not on file  Transportation Needs: Not on file  Physical Activity: Not on file  Stress: Not on file  Social Connections: Not on file  Intimate Partner Violence: Not on file    Allergies  Allergen Reactions   Eggs Or Egg-Derived Products Hives    eczema Poultry Rash and GI symptoms   Erythromycin Other (See Comments)    REACTION: stomach and  hives REACTION: stomach symptoms  and hives   Fish Allergy Other (See Comments) and Swelling    Lip swelling and eczema With fins hives    Gatifloxacin Hives, Nausea Only and Swelling   Other Anaphylaxis, Itching and Swelling    Legumes- all beans and peas And tree nuts  Lip swelling and eczema Legumes- all beans and peas, peanuts Orange Vegetables    Peanut-Containing Drug Products Anaphylaxis    And tree nuts    Poultry Meal Diarrhea and Itching   Banana Itching   Keflex [Cephalexin] Rash   Latex Itching    Current Outpatient Medications  Medication Sig Dispense Refill   albuterol (VENTOLIN HFA) 108 (90 Base) MCG/ACT inhaler Inhale 1-2 puffs into the lungs every 6 (six) hours as needed for wheezing or shortness of breath. 18 g 1   atorvastatin (LIPITOR) 10 MG tablet Take 10 mg by mouth daily.     doxycycline (VIBRAMYCIN) 100 MG capsule Take 100 mg by mouth 2 (two) times daily.  4   EPINEPHrine 0.3 mg/0.3 mL IJ SOAJ injection Inject 0.3 mLs (0.3 mg total) into the muscle as needed for anaphylaxis. 2 each 1   Levothyroxine Sodium 88 MCG CAPS Take 88 mcg by mouth daily before breakfast.     metoprolol tartrate (LOPRESSOR) 25 MG tablet Take 1 tablet (25 mg total) by mouth 2 (two) times daily. 60 tablet 3   mirtazapine (REMERON) 15 MG tablet Take 15 mg by mouth at bedtime.     pantoprazole (PROTONIX) 40 MG tablet Take 1 tablet by mouth daily.     sodium bicarbonate 650 MG tablet Take 1,300 mg by mouth 3 (three) times daily.     No current facility-administered medications for this visit.    REVIEW OF SYSTEMS:  '[X]'$  denotes positive finding, '[ ]'$  denotes negative finding Cardiac  Comments:  Chest pain or chest pressure:    Shortness of breath upon exertion:    Short of breath when lying flat:    Irregular heart rhythm:        Vascular    Pain in calf, thigh, or hip brought on by ambulation:    Pain in feet at night that wakes you up from your sleep:     Blood clot in your  veins:    Leg swelling:         Pulmonary    Oxygen at home:  Productive cough:     Wheezing:         Neurologic    Sudden weakness in arms or legs:     Sudden numbness in arms or legs:     Sudden onset of difficulty speaking or slurred speech:    Temporary loss of vision in one eye:     Problems with dizziness:         Gastrointestinal    Blood in stool:     Vomited blood:         Genitourinary    Burning when urinating:     Blood in urine:        Psychiatric    Major depression:         Hematologic    Bleeding problems:    Problems with blood clotting too easily:        Skin    Rashes or ulcers:        Constitutional    Fever or chills:      PHYSICAL EXAM: There were no vitals filed for this visit.  GENERAL: The patient is a well-nourished female, in no acute distress. The vital signs are documented above. CARDIAC: There is a regular rate and rhythm.  VASCULAR:  Brachial pulses bilateral upper extremities Palpable radial pulses bilateral upper extremities No upper extremity tissue loss PULMONARY: No respiratory distress. ABDOMEN: Soft and non-tender. MUSCULOSKELETAL: There are no major deformities or cyanosis. NEUROLOGIC: No focal weakness or paresthesias are detected. SKIN: There are no ulcers or rashes noted. PSYCHIATRIC: The patient has a normal affect.  DATA:   Vein mapping shows the upper arm cephalic veins are not visualized bilaterally.  Appears to have a healthy basilic vein in the left upper arm which is her non-dominant arm.  Arterial duplex shows triphasic waveforms in both upper extremities.    Assessment/Plan:  82 y.o. female, with hx HTN, HLD, cystectomy with ileal conduit diversion, GERD, CKD stage 4 who presents for evaluation of permanent dialysis placement.  Discussed placement of permanent hemodialysis access in her nondominant arm which would be her left arm.  Unfortunately her cephalic vein is not visualized in the upper arm but  she does have a nice basilic vein.  I talked about basilic AV fistula and this likely requiring two stages.  Risks and benefits discussed including risk of failure to mature and steal syndrome.  All questions answered.  Will get scheduled at [facility] at her convenience.   Marty Heck, MD [facility] [place] Office: [phone]

## 2022-07-19 ENCOUNTER — Ambulatory Visit: Payer: Medicare Other

## 2022-07-19 DIAGNOSIS — I10 Essential (primary) hypertension: Secondary | ICD-10-CM | POA: Diagnosis not present

## 2022-07-19 DIAGNOSIS — I4719 Other supraventricular tachycardia: Secondary | ICD-10-CM

## 2022-07-19 DIAGNOSIS — R9431 Abnormal electrocardiogram [ECG] [EKG]: Secondary | ICD-10-CM

## 2022-07-25 DIAGNOSIS — N184 Chronic kidney disease, stage 4 (severe): Secondary | ICD-10-CM | POA: Diagnosis not present

## 2022-07-31 DIAGNOSIS — D631 Anemia in chronic kidney disease: Secondary | ICD-10-CM | POA: Diagnosis not present

## 2022-07-31 DIAGNOSIS — N184 Chronic kidney disease, stage 4 (severe): Secondary | ICD-10-CM | POA: Diagnosis not present

## 2022-07-31 DIAGNOSIS — E872 Acidosis, unspecified: Secondary | ICD-10-CM | POA: Diagnosis not present

## 2022-07-31 DIAGNOSIS — N2581 Secondary hyperparathyroidism of renal origin: Secondary | ICD-10-CM | POA: Diagnosis not present

## 2022-07-31 DIAGNOSIS — I129 Hypertensive chronic kidney disease with stage 1 through stage 4 chronic kidney disease, or unspecified chronic kidney disease: Secondary | ICD-10-CM | POA: Diagnosis not present

## 2022-08-03 ENCOUNTER — Encounter (HOSPITAL_COMMUNITY): Payer: Self-pay | Admitting: Vascular Surgery

## 2022-08-03 ENCOUNTER — Other Ambulatory Visit: Payer: Self-pay

## 2022-08-03 NOTE — Progress Notes (Addendum)
Ms Linda Rice denies chest pain, palpations or shortness of breath.  Patient denies having any s/s of Covid in her household, also denies any known exposure to Covid.   [name] PCP is Dr.Woodyear, cardiologist is Dr. Robb Matar, Nephrologist is Dr. Belia Heman Kidney. Patient is having AV Fistula place now, in case she needs it in the future.   [name] lives independently  in a cottage at [place], she has assess to medical assist if needed.

## 2022-08-06 ENCOUNTER — Other Ambulatory Visit: Payer: Self-pay

## 2022-08-06 ENCOUNTER — Ambulatory Visit (HOSPITAL_COMMUNITY): Payer: Medicare Other | Admitting: Anesthesiology

## 2022-08-06 ENCOUNTER — Encounter (HOSPITAL_COMMUNITY)
Admission: RE | Disposition: A | Discharge status: Home / Self Care | Payer: Self-pay | Source: Ambulatory Visit | Attending: Vascular Surgery

## 2022-08-06 ENCOUNTER — Ambulatory Visit (HOSPITAL_COMMUNITY)
Admission: RE | Admit: 2022-08-06 | Discharge: 2022-08-06 | Disposition: A | Discharge status: Home / Self Care | Payer: Medicare Other | Source: Ambulatory Visit | Attending: Vascular Surgery | Admitting: Vascular Surgery

## 2022-08-06 ENCOUNTER — Encounter (HOSPITAL_COMMUNITY): Payer: Self-pay | Admitting: Vascular Surgery

## 2022-08-06 ENCOUNTER — Ambulatory Visit (HOSPITAL_BASED_OUTPATIENT_CLINIC_OR_DEPARTMENT_OTHER): Payer: Medicare Other | Admitting: Anesthesiology

## 2022-08-06 DIAGNOSIS — J45909 Unspecified asthma, uncomplicated: Secondary | ICD-10-CM

## 2022-08-06 DIAGNOSIS — Z87440 Personal history of urinary (tract) infections: Secondary | ICD-10-CM | POA: Diagnosis not present

## 2022-08-06 DIAGNOSIS — I129 Hypertensive chronic kidney disease with stage 1 through stage 4 chronic kidney disease, or unspecified chronic kidney disease: Secondary | ICD-10-CM | POA: Insufficient documentation

## 2022-08-06 DIAGNOSIS — K219 Gastro-esophageal reflux disease without esophagitis: Secondary | ICD-10-CM | POA: Diagnosis not present

## 2022-08-06 DIAGNOSIS — N184 Chronic kidney disease, stage 4 (severe): Secondary | ICD-10-CM | POA: Diagnosis not present

## 2022-08-06 DIAGNOSIS — Z09 Encounter for follow-up examination after completed treatment for conditions other than malignant neoplasm: Secondary | ICD-10-CM | POA: Diagnosis not present

## 2022-08-06 DIAGNOSIS — E785 Hyperlipidemia, unspecified: Secondary | ICD-10-CM | POA: Diagnosis not present

## 2022-08-06 DIAGNOSIS — Z87891 Personal history of nicotine dependence: Secondary | ICD-10-CM | POA: Insufficient documentation

## 2022-08-06 DIAGNOSIS — N183 Chronic kidney disease, stage 3 unspecified: Secondary | ICD-10-CM

## 2022-08-06 HISTORY — PX: AV FISTULA PLACEMENT: SHX1204

## 2022-08-06 HISTORY — DX: Personal history of other medical treatment: Z92.89

## 2022-08-06 HISTORY — DX: Chronic kidney disease, unspecified: N18.9

## 2022-08-06 SURGERY — ARTERIOVENOUS (AV) FISTULA CREATION
Anesthesia: Monitor Anesthesia Care | Site: Arm Lower | Laterality: Left

## 2022-08-06 MED ORDER — HEPARIN 6000 UNIT IRRIGATION SOLUTION
Status: DC | PRN
  Administered 2022-08-06: 1

## 2022-08-06 MED ORDER — ONDANSETRON HCL 4 MG/2ML IJ SOLN
INTRAMUSCULAR | Status: DC | PRN
  Administered 2022-08-06: 4 mg via INTRAVENOUS

## 2022-08-06 MED ORDER — HYDROCODONE-ACETAMINOPHEN 5-325 MG PO TABS: 1.0000 | ORAL_TABLET | Freq: Four times a day (QID) | ORAL | 0 refills | Status: DC | PRN

## 2022-08-06 MED ORDER — PROPOFOL 10 MG/ML IV BOLUS
INTRAVENOUS | Status: AC
  Filled 2022-08-06: qty 20

## 2022-08-06 MED ORDER — CHLORHEXIDINE GLUCONATE 4 % EX LIQD: 60.0000 mL | Freq: Once | CUTANEOUS | Status: DC

## 2022-08-06 MED ORDER — PROPOFOL 10 MG/ML IV BOLUS
INTRAVENOUS | Status: DC | PRN
  Administered 2022-08-06: 20 mg via INTRAVENOUS

## 2022-08-06 MED ORDER — ORAL CARE MOUTH RINSE: 15.0000 mL | Freq: Once | OROMUCOSAL | Status: AC

## 2022-08-06 MED ORDER — FENTANYL CITRATE (PF) 100 MCG/2ML IJ SOLN
INTRAMUSCULAR | Status: AC
  Administered 2022-08-06: 50 ug via INTRAVENOUS
  Filled 2022-08-06: qty 2

## 2022-08-06 MED ORDER — SODIUM CHLORIDE 0.9 % IV SOLN: INTRAVENOUS | Status: DC

## 2022-08-06 MED ORDER — LIDOCAINE 2% (20 MG/ML) 5 ML SYRINGE
INTRAMUSCULAR | Status: AC
  Filled 2022-08-06: qty 5

## 2022-08-06 MED ORDER — ONDANSETRON HCL 4 MG/2ML IJ SOLN
INTRAMUSCULAR | Status: AC
  Filled 2022-08-06: qty 2

## 2022-08-06 MED ORDER — VANCOMYCIN HCL IN DEXTROSE 1-5 GM/200ML-% IV SOLN
1000.0000 mg | INTRAVENOUS | Status: AC
  Administered 2022-08-06: 1000 mg via INTRAVENOUS
  Filled 2022-08-06: qty 200

## 2022-08-06 MED ORDER — HEPARIN 6000 UNIT IRRIGATION SOLUTION
Status: AC
  Filled 2022-08-06: qty 500

## 2022-08-06 MED ORDER — CHLORHEXIDINE GLUCONATE 0.12 % MT SOLN
15.0000 mL | Freq: Once | OROMUCOSAL | Status: AC
  Administered 2022-08-06: 15 mL via OROMUCOSAL
  Filled 2022-08-06: qty 15

## 2022-08-06 MED ORDER — PROPOFOL 500 MG/50ML IV EMUL
INTRAVENOUS | Status: DC | PRN
  Administered 2022-08-06: 100 ug/kg/min via INTRAVENOUS

## 2022-08-06 MED ORDER — HEPARIN SODIUM (PORCINE) 1000 UNIT/ML IJ SOLN
INTRAMUSCULAR | Status: DC | PRN
  Administered 2022-08-06: 3000 [IU] via INTRAVENOUS

## 2022-08-06 MED ORDER — HEPARIN SODIUM (PORCINE) 1000 UNIT/ML IJ SOLN
INTRAMUSCULAR | Status: AC
  Filled 2022-08-06: qty 10

## 2022-08-06 MED ORDER — LIDOCAINE HCL 1 % IJ SOLN
INTRAMUSCULAR | Status: AC
  Filled 2022-08-06: qty 20

## 2022-08-06 MED ORDER — LIDOCAINE-EPINEPHRINE (PF) 1.5 %-1:200000 IJ SOLN
INTRAMUSCULAR | Status: DC | PRN
  Administered 2022-08-06: 15 mL via PERINEURAL

## 2022-08-06 MED ORDER — LIDOCAINE-EPINEPHRINE (PF) 1 %-1:200000 IJ SOLN
INTRAMUSCULAR | Status: AC
  Filled 2022-08-06: qty 30

## 2022-08-06 MED ORDER — EPHEDRINE 5 MG/ML INJ
INTRAVENOUS | Status: AC
  Filled 2022-08-06: qty 5

## 2022-08-06 MED ORDER — DEXAMETHASONE SODIUM PHOSPHATE 10 MG/ML IJ SOLN
INTRAMUSCULAR | Status: AC
  Filled 2022-08-06: qty 1

## 2022-08-06 MED ORDER — LIDOCAINE 2% (20 MG/ML) 5 ML SYRINGE
INTRAMUSCULAR | Status: DC | PRN
  Administered 2022-08-06: 20 mg via INTRAVENOUS

## 2022-08-06 MED ORDER — 0.9 % SODIUM CHLORIDE (POUR BTL) OPTIME
TOPICAL | Status: DC | PRN
  Administered 2022-08-06: 1000 mL

## 2022-08-06 MED ORDER — PROPOFOL 1000 MG/100ML IV EMUL
INTRAVENOUS | Status: AC
  Filled 2022-08-06: qty 100

## 2022-08-06 MED ORDER — DEXAMETHASONE SODIUM PHOSPHATE 10 MG/ML IJ SOLN
INTRAMUSCULAR | Status: DC | PRN
  Administered 2022-08-06: 10 mg via INTRAVENOUS

## 2022-08-06 MED ORDER — ONDANSETRON HCL 4 MG/2ML IJ SOLN: 4.0000 mg | Freq: Once | INTRAMUSCULAR | Status: DC | PRN

## 2022-08-06 MED ORDER — EPHEDRINE SULFATE-NACL 50-0.9 MG/10ML-% IV SOSY
PREFILLED_SYRINGE | INTRAVENOUS | Status: DC | PRN
  Administered 2022-08-06: 5 mg via INTRAVENOUS

## 2022-08-06 MED ORDER — FENTANYL CITRATE (PF) 100 MCG/2ML IJ SOLN: 50.0000 ug | Freq: Once | INTRAMUSCULAR | Status: AC

## 2022-08-06 MED ORDER — PHENYLEPHRINE HCL-NACL 20-0.9 MG/250ML-% IV SOLN
INTRAVENOUS | Status: DC | PRN
  Administered 2022-08-06: 40 ug/min via INTRAVENOUS

## 2022-08-06 MED ORDER — MEPIVACAINE HCL (PF) 2 % IJ SOLN
INTRAMUSCULAR | Status: DC | PRN
  Administered 2022-08-06: 10 mL

## 2022-08-06 MED ORDER — FENTANYL CITRATE (PF) 100 MCG/2ML IJ SOLN: 25.0000 ug | INTRAMUSCULAR | Status: DC | PRN

## 2022-08-06 SURGICAL SUPPLY — 36 items
ADH SKN CLS APL DERMABOND .7 (GAUZE/BANDAGES/DRESSINGS) ×1
AGENT HMST SPONGE THK3/8 (HEMOSTASIS)
ARMBAND PINK RESTRICT EXTREMIT (MISCELLANEOUS) ×4 IMPLANT
BAG COUNTER SPONGE SURGICOUNT (BAG) ×2 IMPLANT
BAG SPNG CNTER NS LX DISP (BAG) ×1
BLADE CLIPPER SURG (BLADE) ×2 IMPLANT
CANISTER SUCT 3000ML PPV (MISCELLANEOUS) ×2 IMPLANT
CLIP VESOCCLUDE MED 6/CT (CLIP) ×2 IMPLANT
CLIP VESOCCLUDE SM WIDE 6/CT (CLIP) ×2 IMPLANT
COVER PROBE W GEL 5X96 (DRAPES) ×2 IMPLANT
DERMABOND ADVANCED .7 DNX12 (GAUZE/BANDAGES/DRESSINGS) ×2 IMPLANT
ELECT REM PT RETURN 9FT ADLT (ELECTROSURGICAL) ×1
ELECTRODE REM PT RTRN 9FT ADLT (ELECTROSURGICAL) ×2 IMPLANT
GLOVE BIO SURGEON STRL SZ7.5 (GLOVE) ×2 IMPLANT
GLOVE BIOGEL PI IND STRL 8 (GLOVE) ×2 IMPLANT
GOWN STRL REUS W/ TWL LRG LVL3 (GOWN DISPOSABLE) ×4 IMPLANT
GOWN STRL REUS W/ TWL XL LVL3 (GOWN DISPOSABLE) ×4 IMPLANT
GOWN STRL REUS W/TWL LRG LVL3 (GOWN DISPOSABLE) ×2
GOWN STRL REUS W/TWL XL LVL3 (GOWN DISPOSABLE) ×2
HEMOSTAT SPONGE AVITENE ULTRA (HEMOSTASIS) IMPLANT
KIT BASIN OR (CUSTOM PROCEDURE TRAY) ×2 IMPLANT
KIT TURNOVER KIT B (KITS) ×2 IMPLANT
NS IRRIG 1000ML POUR BTL (IV SOLUTION) ×2 IMPLANT
PACK CV ACCESS (CUSTOM PROCEDURE TRAY) ×2 IMPLANT
PAD ARMBOARD 7.5X6 YLW CONV (MISCELLANEOUS) ×4 IMPLANT
SLING ARM FOAM STRAP LRG (SOFTGOODS) IMPLANT
SLING ARM FOAM STRAP MED (SOFTGOODS) IMPLANT
SPIKE FLUID TRANSFER (MISCELLANEOUS) ×2 IMPLANT
SUT MNCRL AB 4-0 PS2 18 (SUTURE) ×2 IMPLANT
SUT PROLENE 6 0 BV (SUTURE) ×2 IMPLANT
SUT PROLENE 7 0 BV 1 (SUTURE) IMPLANT
SUT VIC AB 3-0 SH 27 (SUTURE) ×1
SUT VIC AB 3-0 SH 27X BRD (SUTURE) ×2 IMPLANT
TOWEL GREEN STERILE (TOWEL DISPOSABLE) ×2 IMPLANT
UNDERPAD 30X36 HEAVY ABSORB (UNDERPADS AND DIAPERS) ×2 IMPLANT
WATER STERILE IRR 1000ML POUR (IV SOLUTION) ×2 IMPLANT

## 2022-08-06 NOTE — H&P (Signed)
History and Physical Interval Note:  08/06/2022 9:02 AM  Linda Rice  has presented today for surgery, with the diagnosis of ESRD.  The various methods of treatment have been discussed with the patient and family. After consideration of risks, benefits and other options for treatment, the patient has consented to  Procedure(s): LEFT ARTERIOVENOUS (AV) FISTULA CREATION (Left) as a surgical intervention.  [name] history has been reviewed, patient examined, no change in status, stable for surgery.  I have reviewed the patient's chart and labs.  Questions were answered to the patient's satisfaction.    Left arm AVF.  Marty Heck     Patient name: Linda Rice  MRN: [id number]        DOB: 15-Nov-1940        Sex: female   REASON FOR CONSULT: New access placement   HPI: Linda Rice is a 82 y.o. female, with hx HTN, HLD, cystectomy with ileal conduit diversion, GERD, CKD stage 4 who presents for evaluation of permanent dialysis access placement.  Patient states her chronic kidney disease is related to her previous bladder infection.  Patient is right-handed.  She has had no dialysis access in the past.  No chest wall implants.  She is here with her daughter.  Not on dialysis at this time.  The notes from [facility] AV fistula now and wait on AV graft.       Past Medical History:  Diagnosis Date   Alopecia     Anxiety     Arthritis     Aspergillosis (HCC)     Asthma     Atopic dermatitis     Bladder incontinence     Complication of anesthesia, mood lability     Depression, situational     Follicular cystitis     History of bilateral knee replacement     History of left hip replacement     History of transfusion     Hyperlipidemia     Hypertension     Hypothyroidism     Lactose intolerance     Multiple food allergies     Osteoarthritis     Osteopenia     Pneumonia     PVC (premature ventricular contraction)      Shortness of breath     Urticaria     Vitamin D deficiency             Past Surgical History:  Procedure Laterality Date   ADENOIDECTOMY       APPENDECTOMY       BLADDER REMOVAL       BREAST EXCISIONAL BIOPSY Right 1990   BROW LIFT   2020   CARPAL TUNNEL RELEASE       CHOLECYSTECTOMY       COLONOSCOPY W/ POLYPECTOMY       COLONOSCOPY WITH PROPOFOL N/A 03/24/2019    Procedure: COLONOSCOPY WITH PROPOFOL;  Surgeon: Clarene Essex, MD;  Location: [place];  Service: Endoscopy;  Laterality: N/A;   EYELID REPAIR W/ SKIN GRAFT       LEFT OOPHORECTOMY       POLYPECTOMY   03/24/2019    Procedure: POLYPECTOMY;  Surgeon: Clarene Essex, MD;  Location: [place];  Service: Endoscopy;;   REPLACEMENT TOTAL KNEE BILATERAL       TONSILLECTOMY       TOTAL HIP ARTHROPLASTY Left 09/05/2015    Procedure: LEFT TOTAL HIP ARTHROPLASTY ANTERIOR APPROACH;  Surgeon: [name],  MD;  Location: [facility];  Service: Orthopedics;  Laterality: Left;   TUBAL LIGATION               Family History  Problem Relation Age of Onset   Diabetes Mother     Heart disease Mother     Hyperlipidemia Mother     Hypertension Mother     Stroke Mother     Anxiety disorder Mother     Obesity Mother     Hyperlipidemia Father     Heart disease Father     Allergic rhinitis Father     Hyperlipidemia Sister     Breast cancer Maternal Aunt     Asthma Maternal Grandfather     Breast cancer Cousin        SOCIAL HISTORY: Social History         Socioeconomic History   Marital status: Divorced      Spouse name: Not on file   Number of children: 4   Years of education: Not on file   Highest education level: Not on file  Occupational History   Not on file  Tobacco Use   Smoking status: Former      Packs/day: 0.50      Years: 4.00      Total pack years: 2.00      Types: Cigarettes      Quit date: 73      Years since quitting: 43.1   Smokeless tobacco: Never   Tobacco comments:      1 pkg a week  Vaping Use    Vaping Use: Never used  Substance and Sexual Activity   Alcohol use: Not Currently      Alcohol/week: 2.0 standard drinks of alcohol      Types: 1 Glasses of wine, 1 Standard drinks or equivalent per week   Drug use: No   Sexual activity: Not on file  Other Topics Concern   Not on file  Social History Narrative   Not on file    Social Determinants of Health    Financial Resource Strain: Not on file  Food Insecurity: Not on file  Transportation Needs: Not on file  Physical Activity: Not on file  Stress: Not on file  Social Connections: Not on file  Intimate Partner Violence: Not on file           Allergies  Allergen Reactions   Eggs Or Egg-Derived Products Hives      eczema Poultry Rash and GI symptoms   Erythromycin Other (See Comments)      REACTION: stomach and hives REACTION: stomach symptoms  and hives   Fish Allergy Other (See Comments) and Swelling      Lip swelling and eczema With fins hives     Gatifloxacin Hives, Nausea Only and Swelling   Other Anaphylaxis, Itching and Swelling      Legumes- all beans and peas And tree nuts  Lip swelling and eczema Legumes- all beans and peas, peanuts Orange Vegetables     Peanut-Containing Drug Products Anaphylaxis      And tree nuts    Poultry Meal Diarrhea and Itching   Banana Itching   Keflex [Cephalexin] Rash   Latex Itching            Current Outpatient Medications  Medication Sig Dispense Refill   albuterol (VENTOLIN HFA) 108 (90 Base) MCG/ACT inhaler Inhale 1-2 puffs into the lungs every 6 (six) hours as needed for wheezing or shortness of breath. 18 g  1   atorvastatin (LIPITOR) 10 MG tablet Take 10 mg by mouth daily.       doxycycline (VIBRAMYCIN) 100 MG capsule Take 100 mg by mouth 2 (two) times daily.   4   EPINEPHrine 0.3 mg/0.3 mL IJ SOAJ injection Inject 0.3 mLs (0.3 mg total) into the muscle as needed for anaphylaxis. 2 each 1   Levothyroxine Sodium 88 MCG CAPS Take 88 mcg by mouth daily before  breakfast.       metoprolol tartrate (LOPRESSOR) 25 MG tablet Take 1 tablet (25 mg total) by mouth 2 (two) times daily. 60 tablet 3   mirtazapine (REMERON) 15 MG tablet Take 15 mg by mouth at bedtime.       pantoprazole (PROTONIX) 40 MG tablet Take 1 tablet by mouth daily.       sodium bicarbonate 650 MG tablet Take 1,300 mg by mouth 3 (three) times daily.        No current facility-administered medications for this visit.      REVIEW OF SYSTEMS:  '[X]'$  denotes positive finding, '[ ]'$  denotes negative finding Cardiac   Comments:  Chest pain or chest pressure:      Shortness of breath upon exertion:      Short of breath when lying flat:      Irregular heart rhythm:             Vascular      Pain in calf, thigh, or hip brought on by ambulation:      Pain in feet at night that wakes you up from your sleep:       Blood clot in your veins:      Leg swelling:              Pulmonary      Oxygen at home:      Productive cough:       Wheezing:              Neurologic      Sudden weakness in arms or legs:       Sudden numbness in arms or legs:       Sudden onset of difficulty speaking or slurred speech:      Temporary loss of vision in one eye:       Problems with dizziness:              Gastrointestinal      Blood in stool:       Vomited blood:              Genitourinary      Burning when urinating:       Blood in urine:             Psychiatric      Major depression:              Hematologic      Bleeding problems:      Problems with blood clotting too easily:             Skin      Rashes or ulcers:             Constitutional      Fever or chills:          PHYSICAL EXAM: There were no vitals filed for this visit.   GENERAL: The patient is a well-nourished female, in no acute distress. The vital signs are documented above. CARDIAC: There is a regular rate and rhythm.  VASCULAR:  Brachial pulses bilateral upper extremities Palpable radial pulses bilateral upper  extremities No upper extremity tissue loss PULMONARY: No respiratory distress. ABDOMEN: Soft and non-tender. MUSCULOSKELETAL: There are no major deformities or cyanosis. NEUROLOGIC: No focal weakness or paresthesias are detected. SKIN: There are no ulcers or rashes noted. PSYCHIATRIC: The patient has a normal affect.   DATA:    Vein mapping shows the upper arm cephalic veins are not visualized bilaterally.  Appears to have a healthy basilic vein in the left upper arm which is her non-dominant arm.   Arterial duplex shows triphasic waveforms in both upper extremities.       Assessment/Plan:   82 y.o. female, with hx HTN, HLD, cystectomy with ileal conduit diversion, GERD, CKD stage 4 who presents for evaluation of permanent dialysis placement.  Discussed placement of permanent hemodialysis access in her nondominant arm which would be her left arm.  Unfortunately her cephalic vein is not visualized in the upper arm but she does have a nice basilic vein.  I talked about basilic AV fistula and this likely requiring two stages.  Risks and benefits discussed including risk of failure to mature and steal syndrome.  All questions answered.  Will get scheduled at [facility] at her convenience.     Marty Heck, MD [facility] [place] Office: [phone]

## 2022-08-06 NOTE — Anesthesia Procedure Notes (Signed)
Anesthesia Procedure Image    

## 2022-08-06 NOTE — Anesthesia Procedure Notes (Signed)
Anesthesia Regional Block: Supraclavicular block   Pre-Anesthetic Checklist: , timeout performed,  Correct Patient, Correct Site, Correct Laterality,  Correct Procedure, Correct Position, site marked,  Risks and benefits discussed,  Surgical consent,  Pre-op evaluation,  At surgeon's request and post-op pain management  Laterality: Left  Prep: chloraprep       Needles:  Injection technique: Single-shot  Needle Type: Echogenic Needle     Needle Length: 9cm      Additional Needles:   Procedures:,,,, ultrasound used (permanent image in chart),,    Narrative:  Start time: 08/06/2022 8:37 AM End time: 08/06/2022 8:45 AM Injection made incrementally with aspirations every 5 mL.  Performed by: Personally  Anesthesiologist: Myrtie Soman, MD  Additional Notes: Patient tolerated the procedure well without complications

## 2022-08-06 NOTE — Transfer of Care (Signed)
Immediate Anesthesia Transfer of Care Note  Patient: Linda Rice  Procedure(s) Performed: LEFT Brachial Basilic ARTERIOVENOUS (AV) FISTULA CREATION (Left: Arm Lower)  Patient Location: PACU  Anesthesia Type:MAC  Level of Consciousness: awake and alert   Airway & Oxygen Therapy: Patient Spontanous Breathing  Post-op Assessment: Report given to RN and Post -op Vital signs reviewed and stable  Post vital signs: Reviewed and stable  Last Vitals:  Vitals Value Taken Time  BP 112/68 08/06/22 1052  Temp    Pulse 59 08/06/22 1054  Resp 26 08/06/22 1053  SpO2 100 % 08/06/22 1054  Vitals shown include unvalidated device data.  Last Pain:  Vitals:   08/06/22 0741  TempSrc:   PainSc: 1       Patients Stated Pain Goal: 0 ([id number])  Complications: There were no known notable events for this encounter.

## 2022-08-06 NOTE — Discharge Instructions (Signed)
° °  [facility] ° °Discharge Instructions ° °AV Fistula or Graft Surgery for Dialysis Access ° °Please refer to the following instructions for your post-procedure care. Your surgeon or physician assistant will discuss any changes with you. ° °Activity ° °You may drive the day following your surgery, if you are comfortable and no longer taking prescription pain medication. Resume full activity as the soreness in your incision resolves. ° °Bathing/Showering ° °You may shower after you go home. Keep your incision dry for 48 hours. Do not soak in a bathtub, hot tub, or swim until the incision heals completely. You may not shower if you have a hemodialysis catheter. ° °Incision Care ° °Clean your incision with mild soap and water after 48 hours. Pat the area dry with a clean towel. You do not need a bandage unless otherwise instructed. Do not apply any ointments or creams to your incision. You may have skin glue on your incision. Do not peel it off. It will come off on its own in about one week. Your arm may swell a bit after surgery. To reduce swelling use pillows to elevate your arm so it is above your heart. Your doctor will tell you if you need to lightly wrap your arm with an ACE bandage. ° °Diet ° °Resume your normal diet. There are not special food restrictions following this procedure. In order to heal from your surgery, it is CRITICAL to get adequate nutrition. Your body requires vitamins, minerals, and protein. Vegetables are the best source of vitamins and minerals. Vegetables also provide the perfect balance of protein. Processed food has little nutritional value, so try to avoid this. ° °Medications ° °Resume taking all of your medications. If your incision is causing pain, you may take over-the counter pain relievers such as acetaminophen (Tylenol). If you were prescribed a stronger pain medication, please be aware these medications can cause nausea and constipation. Prevent  nausea by taking the medication with a snack or meal. Avoid constipation by drinking plenty of fluids and eating foods with high amount of fiber, such as fruits, vegetables, and grains. Do not take Tylenol if you are taking prescription pain medications. ° ° ° ° °Follow up °Your surgeon may want to see you in the office following your access surgery. If so, this will be arranged at the time of your surgery. ° °Please call us immediately for any of the following conditions: ° °Increased pain, redness, drainage (pus) from your incision site °Fever of 101 degrees or higher °Severe or worsening pain at your incision site °Hand pain or numbness. ° °Reduce your risk of vascular disease: ° °Stop smoking. If you would like help, call [facility] at 1-800-QUIT-NOW (1-800-784-8669) or [place] at 336-586-4000 ° °Manage your cholesterol °Maintain a desired weight °Control your diabetes °Keep your blood pressure down ° °Dialysis ° °It will take several weeks to several months for your new dialysis access to be ready for use. Your surgeon will determine when it is OK to use it. Your nephrologist will continue to direct your dialysis. You can continue to use your Permcath until your new access is ready for use. ° °If you have any questions, please call the office at [phone]. ° °

## 2022-08-06 NOTE — Op Note (Signed)
OPERATIVE NOTE  DATE: August 06, 2022  PROCEDURE: left first stage basilic vein transposition (brachiobasilic arteriovenous fistula) placement  PRE-OPERATIVE DIAGNOSIS: Stage IV CKD  POST-OPERATIVE DIAGNOSIS: same  SURGEON: Marty Heck, MD  ASSISTANT(S): [name], PA  ANESTHESIA: regional  ESTIMATED BLOOD LOSS: <25 mL  FINDING(S): 1.  Basilic vein: 3 mm, acceptable 2.  Brachial artery: 3.5 mm, atherosclerotic disease evident 3.  Venous outflow: palpable thrill  4.  Radial flow: palpable radial pulse  SPECIMEN(S):  none  INDICATIONS:   Linda Rice is a 82 y.o. female who presents with stage IV CKD and need for permanent hemodialysis access.  The patient is scheduled for left arm AVF placement.  The patient is aware the risks include but are not limited to: bleeding, infection, steal syndrome, nerve damage, ischemic monomelic neuropathy, failure to mature, and need for additional procedures.  The patient is aware of the risks of the procedure and elects to proceed forward.   DESCRIPTION: After full informed written consent was obtained from the patient, the patient was brought back to the operating room and placed supine upon the operating table.  Prior to induction, the patient received IV antibiotics.   After obtaining adequate anesthesia, the patient was then prepped and draped in the standard fashion for a left arm access procedure.  A timeout was performed.  I evaluated the cephalic vein in the left upper arm with ultrasound that was small and the basilic vein was much larger as indiucated on pre-operative vein mapping.     I made a transverse incision at the level of the antecubitum and dissected through the subcutaneous tissue and fascia to gain exposure of the brachial artery.  This was noted to be 3.5 mm in diameter externally.  This was dissected out proximally and distally and controlled with vessel loops .  I then dissected out the basilic vein.   This was noted to be 3 mm in diameter externally.  The distal segment of the vein was ligated with a  2-0 silk, and the vein was transected.  The proximal segment was interrogated with serial dilators.  The vein accepted up to a 5 mm dilator without any difficulty.  I then instilled the heparinized saline into the vein and clamped it.  At this point, I reset my exposure of the brachial artery.  The patient was given 3,000 units IV heparin.  I then placed the artery under tension proximally and distally.  I made an arteriotomy with a #11 blade, and then I extended the arteriotomy with a Potts scissor.  I injected heparinized saline proximal and distal to this arteriotomy.  The vein was then sewn to the artery in an end-to-side configuration with a running stitch of 6-0 Prolene.  Prior to completing this anastomosis, I allowed the vein and artery to backbleed.  There was no evidence of clot from any vessels.  I completed the anastomosis in the usual fashion and then released all vessel loops and clamps.    There was a palpable thrill in the venous outflow, and there was a palpable radial pulse.  At this point, I irrigated out the surgical wound.  There was no further active bleeding.  The subcutaneous tissue was reapproximated with a running stitch of 3-0 Vicryl.  The skin was then reapproximated with a running subcuticular stitch of 4-0 Monocryl.  The skin was then cleaned, dried, and reinforced with Dermabond.  The patient tolerated this procedure well.   COMPLICATIONS: None  CONDITION: Stable  Marty Heck MD [facility] Office: [place]   08/06/2022, 10:40 AM

## 2022-08-06 NOTE — Anesthesia Postprocedure Evaluation (Signed)
Anesthesia Post Note  Patient: Linda Rice  Procedure(s) Performed: LEFT Brachial Basilic ARTERIOVENOUS (AV) FISTULA CREATION (Left: Arm Lower)     Patient location during evaluation: PACU Anesthesia Type: MAC Level of consciousness: awake and alert Pain management: pain level controlled Vital Signs Assessment: post-procedure vital signs reviewed and stable Respiratory status: spontaneous breathing, nonlabored ventilation, respiratory function stable and patient connected to nasal cannula oxygen Cardiovascular status: stable and blood pressure returned to baseline Postop Assessment: no apparent nausea or vomiting Anesthetic complications: no  There were no known notable events for this encounter.  Last Vitals:  Vitals:   08/06/22 1100 08/06/22 1115  BP: 114/67 128/68  Pulse: 62 60  Resp: 18 20  Temp:  36.5 C  SpO2: 98% 100%    Last Pain:  Vitals:   08/06/22 1115  TempSrc:   PainSc: 0-No pain                 Keimon Basaldua S

## 2022-08-06 NOTE — Anesthesia Preprocedure Evaluation (Signed)
Anesthesia Evaluation  Patient identified by MRN, date of birth, ID band Patient awake    Reviewed: Allergy & Precautions, H&P , NPO status , Patient's Chart, lab work & pertinent test results  Airway Mallampati: II  TM Distance: >3 FB Neck ROM: Full    Dental no notable dental hx.    Pulmonary asthma , former smoker   Pulmonary exam normal breath sounds clear to auscultation       Cardiovascular hypertension, Normal cardiovascular exam Rhythm:Regular Rate:Normal     Neuro/Psych negative neurological ROS  negative psych ROS   GI/Hepatic negative GI ROS, Neg liver ROS,,,  Endo/Other  Hypothyroidism    Renal/GU DialysisRenal disease  negative genitourinary   Musculoskeletal negative musculoskeletal ROS (+)    Abdominal   Peds negative pediatric ROS (+)  Hematology negative hematology ROS (+)   Anesthesia Other Findings   Reproductive/Obstetrics negative OB ROS                             Anesthesia Physical Anesthesia Plan  ASA: 3  Anesthesia Plan: MAC   Post-op Pain Management: Regional block*   Induction: Intravenous  PONV Risk Score and Plan: 2 and Propofol infusion and Treatment may vary due to age or medical condition  Airway Management Planned: Simple Face Mask  Additional Equipment:   Intra-op Plan:   Post-operative Plan:   Informed Consent: I have reviewed the patients History and Physical, chart, labs and discussed the procedure including the risks, benefits and alternatives for the proposed anesthesia with the patient or authorized representative who has indicated his/her understanding and acceptance.     Dental advisory given  Plan Discussed with: CRNA and Surgeon  Anesthesia Plan Comments:        Anesthesia Quick Evaluation

## 2022-08-07 ENCOUNTER — Encounter (HOSPITAL_COMMUNITY): Payer: Self-pay | Admitting: Vascular Surgery

## 2022-08-07 LAB — POCT I-STAT, CHEM 8
BUN: 57 mg/dL — ABNORMAL HIGH (ref 8–23)
Calcium, Ion: 0.96 mmol/L — ABNORMAL LOW (ref 1.15–1.40)
Chloride: 106 mmol/L (ref 98–111)
Creatinine, Ser: 2.5 mg/dL — ABNORMAL HIGH (ref 0.44–1.00)
Glucose, Bld: 83 mg/dL (ref 70–99)
HCT: 36 % (ref 36.0–46.0)
Hemoglobin: 12.2 g/dL (ref 12.0–15.0)
Potassium: 3.8 mmol/L (ref 3.5–5.1)
Sodium: 139 mmol/L (ref 135–145)
TCO2: 23 mmol/L (ref 22–32)

## 2022-08-08 ENCOUNTER — Ambulatory Visit (HOSPITAL_COMMUNITY)
Admission: RE | Admit: 2022-08-08 | Discharge: 2022-08-08 | Disposition: A | Discharge status: Home / Self Care | Payer: Medicare Other | Source: Ambulatory Visit | Attending: Nephrology | Admitting: Nephrology

## 2022-08-08 VITALS — BP 121/62 | HR 67 | Temp 97.5°F | Resp 18

## 2022-08-08 DIAGNOSIS — N184 Chronic kidney disease, stage 4 (severe): Secondary | ICD-10-CM | POA: Diagnosis not present

## 2022-08-08 LAB — IRON AND TIBC
Iron: 58 ug/dL (ref 28–170)
Saturation Ratios: 31 % (ref 10.4–31.8)
TIBC: 186 ug/dL — ABNORMAL LOW (ref 250–450)
UIBC: 128 ug/dL

## 2022-08-08 LAB — POCT HEMOGLOBIN-HEMACUE: Hemoglobin: 11.4 g/dL — ABNORMAL LOW (ref 12.0–15.0)

## 2022-08-08 LAB — FERRITIN: Ferritin: 49 ng/mL (ref 11–307)

## 2022-08-08 MED ORDER — DARBEPOETIN ALFA 40 MCG/0.4ML IJ SOSY
PREFILLED_SYRINGE | INTRAMUSCULAR | Status: AC
  Filled 2022-08-08: qty 0.4

## 2022-08-08 MED ORDER — DARBEPOETIN ALFA 40 MCG/0.4ML IJ SOSY
40.0000 ug | PREFILLED_SYRINGE | INTRAMUSCULAR | Status: DC
  Administered 2022-08-08: 40 ug via SUBCUTANEOUS

## 2022-08-09 DIAGNOSIS — Z9889 Other specified postprocedural states: Secondary | ICD-10-CM | POA: Diagnosis not present

## 2022-08-09 DIAGNOSIS — N1330 Unspecified hydronephrosis: Secondary | ICD-10-CM | POA: Diagnosis not present

## 2022-08-10 ENCOUNTER — Encounter: Payer: Self-pay | Admitting: Cardiology

## 2022-08-10 ENCOUNTER — Telehealth: Payer: Self-pay | Admitting: Physician Assistant

## 2022-08-10 ENCOUNTER — Ambulatory Visit: Payer: Medicare Other | Admitting: Cardiology

## 2022-08-10 VITALS — BP 126/75 | HR 75 | Resp 16 | Ht 64.0 in | Wt 146.2 lb

## 2022-08-10 DIAGNOSIS — I10 Essential (primary) hypertension: Secondary | ICD-10-CM

## 2022-08-10 DIAGNOSIS — I4719 Other supraventricular tachycardia: Secondary | ICD-10-CM

## 2022-08-10 MED ORDER — METOPROLOL TARTRATE 25 MG PO TABS: 25.0000 mg | ORAL_TABLET | Freq: Two times a day (BID) | ORAL | 3 refills | Status: DC

## 2022-08-10 NOTE — Telephone Encounter (Signed)
-----   Message from Dagoberto Ligas, PA-C sent at 08/06/2022 10:44 AM EST -----  4-6 weeks with fistula duplex with PA on [place] day.  PO L 1st stage basilic fistula Thanks

## 2022-08-10 NOTE — Progress Notes (Signed)
Patient referred by Burman Freestone, MD for irregular heart rhythm  Subjective:   Linda Rice, female    DOB: Jan 05, 1941, 82 y.o.   MRN: [id number]   Chief Complaint  Patient presents with   Hypertension   Follow-up    4-6 weeks     HPI  82 y.o. Caucasian female with hypertension, hyperlipidemia, asthma, hypothyroidism, frequent PACs  Patient has recently had progressively worsening CKD, for which she is seeing Dr. Candiss Norse.  They are contemplating placement of AV fistula/graft or vascular surgery.  Blood pressure is better controlled with addition of metoprolol, heart irate is lower too.    Current Outpatient Medications:    albuterol (VENTOLIN HFA) 108 (90 Base) MCG/ACT inhaler, Inhale 1-2 puffs into the lungs every 6 (six) hours as needed for wheezing or shortness of breath. (Patient taking differently: Inhale 1-2 puffs into the lungs every 6 (six) hours as needed (Asthma).), Disp: 18 g, Rfl: 1   atorvastatin (LIPITOR) 10 MG tablet, Take 10 mg by mouth in the morning., Disp: , Rfl:    cetirizine (ZYRTEC) 10 MG tablet, Take 10 mg by mouth daily. [place] 24 hr, Disp: , Rfl:    Darbepoetin Alfa (ARANESP) 40 MCG/0.4ML SOSY injection, Inject 40 mcg into the skin See admin instructions. Every 4 weeks, Disp: , Rfl:    doxycycline (VIBRAMYCIN) 100 MG capsule, Take 100 mg by mouth 2 (two) times daily., Disp: , Rfl: 4   EPINEPHrine 0.3 mg/0.3 mL IJ SOAJ injection, Inject 0.3 mLs (0.3 mg total) into the muscle as needed for anaphylaxis., Disp: 2 each, Rfl: 1   HYDROcodone-acetaminophen (NORCO) 5-325 MG tablet, Take 1 tablet by mouth every 6 (six) hours as needed for moderate pain., Disp: 10 tablet, Rfl: 0   Levothyroxine Sodium 88 MCG CAPS, Take 88 mcg by mouth daily before breakfast., Disp: , Rfl:    metoprolol tartrate (LOPRESSOR) 25 MG tablet, Take 1 tablet (25 mg total) by mouth 2 (two) times daily., Disp: 60 tablet, Rfl: 3   mirtazapine (REMERON) 15 MG tablet, Take 15 mg by  mouth at bedtime., Disp: , Rfl:    Multiple Vitamins-Minerals (MULTIVITAMIN WITH MINERALS) tablet, Take 1 tablet by mouth daily. Woman, Disp: , Rfl:    pantoprazole (PROTONIX) 40 MG tablet, Take 40 mg by mouth in the morning., Disp: , Rfl:    sodium bicarbonate 650 MG tablet, Take 1,300 mg by mouth 2 (two) times daily., Disp: , Rfl:   Cardiovascular studies:  EKG 07/12/2022: Multifocal atrial tachycardia 103 bpm  Echocardiogram 07/19/2022: Normal LV systolic function with visual EF 55-60%. Left ventricle cavity is normal in size. Normal left ventricular wall thickness. Normal global wall motion. Doppler evidence of grade I (impaired) diastolic dysfunction, normal LAP. Calculated EF 51%. Left atrial cavity is moderately dilated at 37.9 ml/m^2. Trileaflet aortic valve.  Mild (Grade I) aortic regurgitation. Sclerosis of the aortic valve. Structurally normal mitral valve.  Mild (Grade I) mitral regurgitation. Structurally normal tricuspid valve.  Mild tricuspid regurgitation. No evidence of pulmonary hypertension. No significant change compared to 06/2020.   Recent labs: 06/14/2022: Glucose 82, BUN/Cr 35/2.25. EGFR 22. Na/K 139/4.4.  H/H 10/31. MCV 86. Platelets 320  01/19/2019: Glucose 85. BUN/Cr 20/0.78. eGFR 72. Na/K 142/4.1. Rest of the CMP normal Chol 186, TG 132, HDL 59, LDL 101.  TSH 0.97 normal Occult fecal blood positive   Review of Systems  Cardiovascular:  Negative for chest pain, dyspnea on exertion, leg swelling, palpitations and syncope.  Neurological:  Positive for dizziness.         Vitals:   08/10/22 0944  BP: 126/75  Pulse: 75  Resp: 16  SpO2: 95%   Orthostatic VS for the past 72 hrs (Last 3 readings):  Patient Position BP Location Cuff Size  08/10/22 0944 Sitting Left Arm Normal      Body mass index is 25.1 kg/m. Filed Weights   08/10/22 0944  Weight: 146 lb 3.2 oz (66.3 kg)     Objective:   Physical Exam Vitals and nursing note reviewed.   Constitutional:      General: She is not in acute distress.    Appearance: She is well-developed.  Neck:     Vascular: No JVD.  Cardiovascular:     Rate and Rhythm: Regular rhythm. Tachycardia present. Frequent Extrasystoles are present.    Pulses: Intact distal pulses.     Heart sounds: Normal heart sounds. No murmur heard. Pulmonary:     Effort: Pulmonary effort is normal.     Breath sounds: Normal breath sounds. No wheezing or rales.  Musculoskeletal:     Right lower leg: No edema.     Left lower leg: No edema.           Assessment & Recommendations:    82 y.o. Caucasian female with hypertension, hyperlipidemia, asthma, hypothyroidism, frequent PACs/MAT, CKD IV  Frequent PACs/MAT: Longstanding, asymptomatic.  No A. fib seen. Likely secondary to underlying asthma. Rate better with metoprolol tartrate.  Hypertension: Well controlled on metoprolol tartrate 25 mg twice daily.    F/u in 6 months    Nigel Mormon, MD Pager: [phone] Office: [phone]

## 2022-08-14 DIAGNOSIS — D0439 Carcinoma in situ of skin of other parts of face: Secondary | ICD-10-CM | POA: Diagnosis not present

## 2022-08-14 DIAGNOSIS — D485 Neoplasm of uncertain behavior of skin: Secondary | ICD-10-CM | POA: Diagnosis not present

## 2022-08-14 DIAGNOSIS — D692 Other nonthrombocytopenic purpura: Secondary | ICD-10-CM | POA: Diagnosis not present

## 2022-08-14 DIAGNOSIS — Z85828 Personal history of other malignant neoplasm of skin: Secondary | ICD-10-CM | POA: Diagnosis not present

## 2022-08-21 ENCOUNTER — Encounter: Payer: Self-pay | Admitting: Physician Assistant

## 2022-08-21 DIAGNOSIS — Z85828 Personal history of other malignant neoplasm of skin: Secondary | ICD-10-CM | POA: Diagnosis not present

## 2022-08-21 DIAGNOSIS — D0439 Carcinoma in situ of skin of other parts of face: Secondary | ICD-10-CM | POA: Diagnosis not present

## 2022-08-21 NOTE — Telephone Encounter (Signed)
Pt called back in to schedule appt. Will call back again with an exact date for appt because she is going to [place] for 2.5 weeks.

## 2022-08-22 ENCOUNTER — Encounter (HOSPITAL_COMMUNITY): Payer: Medicare Other

## 2022-09-04 ENCOUNTER — Ambulatory Visit (HOSPITAL_COMMUNITY)
Admission: RE | Admit: 2022-09-04 | Discharge: 2022-09-04 | Disposition: A | Discharge status: Home / Self Care | Payer: Medicare Other | Source: Ambulatory Visit | Attending: Nephrology | Admitting: Nephrology

## 2022-09-04 VITALS — BP 124/75 | HR 97 | Temp 97.0°F | Resp 18

## 2022-09-04 DIAGNOSIS — N184 Chronic kidney disease, stage 4 (severe): Secondary | ICD-10-CM | POA: Diagnosis not present

## 2022-09-04 LAB — FERRITIN: Ferritin: 94 ng/mL (ref 11–307)

## 2022-09-04 LAB — IRON AND TIBC
Iron: 31 ug/dL (ref 28–170)
Saturation Ratios: 22 % (ref 10.4–31.8)
TIBC: 141 ug/dL — ABNORMAL LOW (ref 250–450)
UIBC: 110 ug/dL

## 2022-09-04 LAB — POCT HEMOGLOBIN-HEMACUE: Hemoglobin: 11.4 g/dL — ABNORMAL LOW (ref 12.0–15.0)

## 2022-09-04 MED ORDER — DARBEPOETIN ALFA 40 MCG/0.4ML IJ SOSY
PREFILLED_SYRINGE | INTRAMUSCULAR | Status: AC
  Filled 2022-09-04: qty 0.4

## 2022-09-04 MED ORDER — DARBEPOETIN ALFA 40 MCG/0.4ML IJ SOSY
40.0000 ug | PREFILLED_SYRINGE | INTRAMUSCULAR | Status: DC
  Administered 2022-09-04: 40 ug via SUBCUTANEOUS

## 2022-09-18 ENCOUNTER — Other Ambulatory Visit: Payer: Self-pay | Admitting: *Deleted

## 2022-09-18 DIAGNOSIS — N184 Chronic kidney disease, stage 4 (severe): Secondary | ICD-10-CM

## 2022-09-21 ENCOUNTER — Encounter: Payer: Self-pay | Admitting: Cardiology

## 2022-09-21 ENCOUNTER — Ambulatory Visit: Payer: Medicare Other

## 2022-09-21 ENCOUNTER — Ambulatory Visit: Payer: Medicare Other | Admitting: Cardiology

## 2022-09-21 ENCOUNTER — Ambulatory Visit
Admission: RE | Admit: 2022-09-21 | Discharge: 2022-09-21 | Disposition: A | Discharge status: Home / Self Care | Payer: Medicare Other | Source: Ambulatory Visit | Attending: Cardiology | Admitting: Cardiology

## 2022-09-21 VITALS — BP 114/57 | HR 78 | Resp 16 | Ht 64.0 in | Wt 146.0 lb

## 2022-09-21 DIAGNOSIS — I4719 Other supraventricular tachycardia: Secondary | ICD-10-CM | POA: Diagnosis not present

## 2022-09-21 DIAGNOSIS — I491 Atrial premature depolarization: Secondary | ICD-10-CM

## 2022-09-21 DIAGNOSIS — S0990XA Unspecified injury of head, initial encounter: Secondary | ICD-10-CM

## 2022-09-21 MED ORDER — METOPROLOL TARTRATE 25 MG PO TABS: 25.0000 mg | ORAL_TABLET | Freq: Three times a day (TID) | ORAL | 0 refills | Status: DC

## 2022-09-21 NOTE — Telephone Encounter (Signed)
From patient.

## 2022-09-21 NOTE — Progress Notes (Signed)
Patient referred by Zoila Shutter, MD for irregular heart rhythm  Subjective:   Linda Rice, female    DOB: 30-Jun-1940, 82 y.o.   MRN: [id number]   Chief Complaint  Patient presents with   PAC (premature atrial contraction)   Follow-up     HPI  82 y.o. Caucasian female with hypertension, hyperlipidemia, asthma, hypothyroidism, frequent PACs  Patient is here with her daughter today.  She was recently visiting her sister in [place].  While there, she had an episode of tingling in both hands, dizziness, palpitations.  She went to a hospital there, where she was found to be A-fib.  Of note, her magnesium was very low at 0.15, hemoglobin ranged between 7-9 g/dL.  She was hospitalized for 2-3 days.  Anticoagulation was discussed, but patient was unsure.  She was discharged, reportedly was still in A-fib.  2 days later, patient was walking on the sidewalk, felt dizzy, and had a fall.  She had back of her head with significant bruising.  She was again taken to the hospital, and underwent laceration repair on the back of her scalp.  Fortunately, she is not on blood thinners.  Therefore, no imaging was performed.  Patient is now back in the [place].  She denies any nausea, vomiting, vision changes, focal weakness, but continues to have severe headache.  He remains unsure of being on anticoagulation.  Patient has recently had progressively worsening CKD, for which she is seeing Dr. Thedore Mins.  They are contemplating placement of AV fistula/graft or vascular surgery.  Blood pressure is better controlled with addition of metoprolol, heart irate is lower too.    Current Outpatient Medications:    albuterol (VENTOLIN HFA) 108 (90 Base) MCG/ACT inhaler, Inhale 1-2 puffs into the lungs every 6 (six) hours as needed for wheezing or shortness of breath. (Patient taking differently: Inhale 1-2 puffs into the lungs every 6 (six) hours as needed (Asthma).), Disp: 18 g, Rfl: 1   atorvastatin (LIPITOR) 10  MG tablet, Take 10 mg by mouth in the morning., Disp: , Rfl:    cetirizine (ZYRTEC) 10 MG tablet, Take 10 mg by mouth daily. [place] 24 hr, Disp: , Rfl:    Darbepoetin Alfa (ARANESP) 40 MCG/0.4ML SOSY injection, Inject 40 mcg into the skin See admin instructions. Every 4 weeks, Disp: , Rfl:    doxycycline (VIBRAMYCIN) 100 MG capsule, Take 100 mg by mouth 2 (two) times daily., Disp: , Rfl: 4   EPINEPHrine 0.3 mg/0.3 mL IJ SOAJ injection, Inject 0.3 mLs (0.3 mg total) into the muscle as needed for anaphylaxis., Disp: 2 each, Rfl: 1   HYDROcodone-acetaminophen (NORCO) 5-325 MG tablet, Take 1 tablet by mouth every 6 (six) hours as needed for moderate pain., Disp: 10 tablet, Rfl: 0   Levothyroxine Sodium 88 MCG CAPS, Take 88 mcg by mouth daily before breakfast., Disp: , Rfl:    metoprolol tartrate (LOPRESSOR) 25 MG tablet, Take 1 tablet (25 mg total) by mouth 2 (two) times daily., Disp: 180 tablet, Rfl: 3   mirtazapine (REMERON) 15 MG tablet, Take 15 mg by mouth at bedtime., Disp: , Rfl:    Multiple Vitamins-Minerals (MULTIVITAMIN WITH MINERALS) tablet, Take 1 tablet by mouth daily. Woman, Disp: , Rfl:    pantoprazole (PROTONIX) 40 MG tablet, Take 40 mg by mouth in the morning., Disp: , Rfl:    sodium bicarbonate 650 MG tablet, Take 1,300 mg by mouth 2 (two) times daily., Disp: , Rfl:   Cardiovascular studies:  EKG 09/21/2022: Sinus rhythm  Frequent PACs   EKG 07/12/2022: Multifocal atrial tachycardia 103 bpm  Echocardiogram 07/19/2022: Normal LV systolic function with visual EF 55-60%. Left ventricle cavity is normal in size. Normal left ventricular wall thickness. Normal global wall motion. Doppler evidence of grade I (impaired) diastolic dysfunction, normal LAP. Calculated EF 51%. Left atrial cavity is moderately dilated at 37.9 ml/m^2. Trileaflet aortic valve.  Mild (Grade I) aortic regurgitation. Sclerosis of the aortic valve. Structurally normal mitral valve.  Mild (Grade I) mitral  regurgitation. Structurally normal tricuspid valve.  Mild tricuspid regurgitation. No evidence of pulmonary hypertension. No significant change compared to 06/2020.   Recent labs: 06/14/2022: Glucose 82, BUN/Cr 35/2.25. EGFR 22. Na/K 139/4.4.  H/H 10/31. MCV 86. Platelets 320  01/19/2019: Glucose 85. BUN/Cr 20/0.78. eGFR 72. Na/K 142/4.1. Rest of the CMP normal Chol 186, TG 132, HDL 59, LDL 101.  TSH 0.97 normal Occult fecal blood positive   Review of Systems  Cardiovascular:  Negative for chest pain, dyspnea on exertion, leg swelling, palpitations and syncope.  Neurological:  Positive for dizziness and headaches. Negative for focal weakness.         Vitals:   09/21/22 1137  BP: (!) 114/57  Pulse: 78  Resp: 16  SpO2: 96%    Body mass index is 25.06 kg/m. Filed Weights   09/21/22 1137  Weight: 146 lb (66.2 kg)     Objective:   Physical Exam Vitals and nursing note reviewed.  Constitutional:      General: She is not in acute distress.    Appearance: She is well-developed.  HENT:     Head:     Comments: Ecchymosis in back of scalp Neck:     Vascular: No JVD.  Cardiovascular:     Rate and Rhythm: Regular rhythm. Tachycardia present. Frequent Extrasystoles are present.    Pulses: Intact distal pulses.     Heart sounds: Normal heart sounds. No murmur heard. Pulmonary:     Effort: Pulmonary effort is normal.     Breath sounds: Normal breath sounds. No wheezing or rales.  Musculoskeletal:     Right lower leg: No edema.     Left lower leg: No edema.       ICD-10-CM   1. PAC (premature atrial contraction)  I49.1 EKG 12-Lead    LONG TERM MONITOR (3-14 DAYS)    2. Injury of head, initial encounter  S09.90XA CT HEAD WO CONTRAST ( )    3. Multifocal atrial tachycardia  I47.19 metoprolol tartrate (LOPRESSOR) 25 MG tablet     Meds ordered this encounter  Medications   metoprolol tartrate (LOPRESSOR) 25 MG tablet    Sig: Take 1 tablet (25 mg total) by mouth  in the morning, at noon, and at bedtime.    Dispense:  1 tablet    Refill:  0         Assessment & Recommendations:    82 y.o. Caucasian female with hypertension, hyperlipidemia, asthma, hypothyroidism, frequent PACs/MAT, CKD IV  Frequent PACs/MAT: Longstanding, asymptomatic issue. No A-fib has been documented on EKG with me, she reportedly had an episode of A-fib while in [place] that required hospitalization.  EKG from [place] is not available for my review.  Nonetheless, I have to assume that she did indeed have an episode of A-fib, albeit in the setting of severe hypomagnesemia. Now she is on magnesium supplement.  Her CHA2DS2VASc score  is high, and she would ideally require anticoagulation for stroke prevention.  However, her anemia  of CKD, frequent falls, and recent head trauma would pursue a dose against.  At this time, we have mutually decided to hold off anticoagulation.  I will obtain a stat CT head to rule out any intracranial injury or subdural hematoma.  I will place her on a 2-week external cardiac monitor.  If cardiac monitor shows A-fib, we will again discuss anticoagulation versus left atrial appendage closure at next visit.  If external monitor does not show A-fib, we will then discuss placement of loop recorder for surveillance purposes.  Recommend metoprolol tartrate 25 mg tid for now.  Emphasized importance of compliance with magnesium supplement.  Hypertension: Well controlled.  CKD: Creatinine 2.6-3.1 while in [place]. Continue follow-up with nephrology.    F/u in 4-6 weeks  Time spent: 40 min   Elder Negus, MD Pager: [phone] Office: [phone]

## 2022-09-25 ENCOUNTER — Encounter: Payer: Self-pay | Admitting: Physician Assistant

## 2022-09-25 ENCOUNTER — Ambulatory Visit (HOSPITAL_COMMUNITY)
Admission: RE | Admit: 2022-09-25 | Discharge: 2022-09-25 | Disposition: A | Discharge status: Home / Self Care | Payer: Medicare Other | Source: Ambulatory Visit | Attending: Vascular Surgery | Admitting: Vascular Surgery

## 2022-09-25 ENCOUNTER — Ambulatory Visit (INDEPENDENT_AMBULATORY_CARE_PROVIDER_SITE_OTHER): Payer: Medicare Other | Admitting: Physician Assistant

## 2022-09-25 VITALS — BP 100/60 | HR 70 | Temp 97.5°F | Resp 18 | Ht 63.0 in | Wt 146.9 lb

## 2022-09-25 DIAGNOSIS — N184 Chronic kidney disease, stage 4 (severe): Secondary | ICD-10-CM | POA: Insufficient documentation

## 2022-09-25 NOTE — Progress Notes (Signed)
POST OPERATIVE OFFICE NOTE    CC:  F/u for surgery  HPI:  This is a 82 y.o. female who is s/p left 1st stage BVT on 08/06/2022 by Dr. Chestine Spore.   Pt states she does not have pain/numbness in the left hand.    The pt is not yet on dialysis.  She states that she recently travelled overseas to [place] and was hospitalized.  She had fallen and hit her head.  She was not on blood thinners.  She states that her electrolytes were imbalanced.  There was some mention of afib, however, she has seen Dr. Joaquim Nam since being back.  He has her on on holter monitor.  Per pt, he feels she is stable.     Allergies  Allergen Reactions   Egg-Derived Products Hives    eczema Poultry Rash and GI symptoms   Erythromycin Other (See Comments)    REACTION: stomach and hives REACTION: stomach symptoms  and hives   Fish Allergy Other (See Comments) and Swelling    Lip swelling and eczema With fins hives    Gatifloxacin Hives, Nausea Only and Swelling   Other Anaphylaxis, Itching and Swelling    Legumes- all beans and peas And tree nuts  Lip swelling and eczema Legumes- all beans and peas, peanuts Orange Vegetables    Peanut-Containing Drug Products Anaphylaxis    And tree nuts    Poultry Meal Diarrhea and Itching   Banana Itching   Keflex [Cephalexin] Rash   Latex Itching    Current Outpatient Medications  Medication Sig Dispense Refill   albuterol (VENTOLIN HFA) 108 (90 Base) MCG/ACT inhaler Inhale 1-2 puffs into the lungs every 6 (six) hours as needed for wheezing or shortness of breath. (Patient taking differently: Inhale 1-2 puffs into the lungs every 6 (six) hours as needed (Asthma).) 18 g 1   atorvastatin (LIPITOR) 10 MG tablet Take 10 mg by mouth in the morning.     cetirizine (ZYRTEC) 10 MG tablet Take 10 mg by mouth daily. [place] 24 hr     Darbepoetin Alfa (ARANESP) 40 MCG/0.4ML SOSY injection Inject 40 mcg into the skin See admin instructions. Every 4 weeks     doxycycline  (VIBRAMYCIN) 100 MG capsule Take 100 mg by mouth 2 (two) times daily.  4   EPINEPHrine 0.3 mg/0.3 mL IJ SOAJ injection Inject 0.3 mLs (0.3 mg total) into the muscle as needed for anaphylaxis. 2 each 1   HYDROcodone-acetaminophen (NORCO) 5-325 MG tablet Take 1 tablet by mouth every 6 (six) hours as needed for moderate pain. 10 tablet 0   Levothyroxine Sodium 88 MCG CAPS Take 88 mcg by mouth daily before breakfast.     metoprolol tartrate (LOPRESSOR) 25 MG tablet Take 1 tablet (25 mg total) by mouth in the morning, at noon, and at bedtime. 1 tablet 0   mirtazapine (REMERON SOL-TAB) 30 MG disintegrating tablet Take 30 mg by mouth at bedtime.     Multiple Vitamins-Minerals (MULTIVITAMIN WITH MINERALS) tablet Take 1 tablet by mouth daily. Woman     pantoprazole (PROTONIX) 40 MG tablet Take 40 mg by mouth in the morning. (Patient not taking: Reported on 09/21/2022)     sodium bicarbonate 650 MG tablet Take 1,300 mg by mouth 2 (two) times daily.     No current facility-administered medications for this visit.     ROS:  See HPI  Physical Exam:  Today's Vitals   09/25/22 1340  BP: 100/60  Pulse: 70  Resp: 18  Temp: (!)  97.5 F (36.4 C)  TempSrc: Temporal  SpO2: 98%  Weight: 146 lb 14.4 oz (66.6 kg)  Height:  (1.6 m)  PainSc: 0-No pain   Body mass index is 26.02 kg/m.   Incision:  healed nicely Extremities:   There is a palpable left radial  pulse.   Motor and sensory are in tact.   There is a thrill/bruit present.  Access is  easily palpable   Dialysis Duplex on 09/25/2022: +------------+----------+-------------+----------+--------+  OUTFLOW VEINPSV (cm/s)Diameter (cm)Depth (cm)Describe  +------------+----------+-------------+----------+--------+  Prox UA        107        0.70        0.62             +------------+----------+-------------+----------+--------+  Mid UA         174        0.74        0.74              +------------+----------+-------------+----------+--------+  Dist UA        359        0.52        0.36             +------------+----------+-------------+----------+--------+    Assessment/Plan:  This is a 82 y.o. female who is s/p: left 1st stage BVT on 08/06/2022 by Dr. Chestine Spore  -the pt does not have evidence of steal. -fistula has matured nicely and has an excellent thrill.  Will plan to proceed with 2nd stage.  Pt is currently on a holter monitor for events noted above.  Discussed with Dr. Chestine Spore and he is okay proceeding with 2nd stage as long as Dr. Joaquim Nam is okay for pt to have surgery as we can proceed with a block and use minimal anesthesia if needed.  She is currently no on anticoagulation.  -discussed with pt that access does not last forever and will need intervention or even new access at some point.     Doreatha Massed, [facility] [phone]  Clinic MD:  Chestine Spore

## 2022-09-26 ENCOUNTER — Other Ambulatory Visit: Payer: Self-pay

## 2022-09-26 DIAGNOSIS — N184 Chronic kidney disease, stage 4 (severe): Secondary | ICD-10-CM

## 2022-10-01 DIAGNOSIS — I4891 Unspecified atrial fibrillation: Secondary | ICD-10-CM | POA: Diagnosis not present

## 2022-10-01 DIAGNOSIS — N184 Chronic kidney disease, stage 4 (severe): Secondary | ICD-10-CM | POA: Diagnosis not present

## 2022-10-01 DIAGNOSIS — I129 Hypertensive chronic kidney disease with stage 1 through stage 4 chronic kidney disease, or unspecified chronic kidney disease: Secondary | ICD-10-CM | POA: Diagnosis not present

## 2022-10-01 DIAGNOSIS — R609 Edema, unspecified: Secondary | ICD-10-CM | POA: Diagnosis not present

## 2022-10-01 DIAGNOSIS — E872 Acidosis, unspecified: Secondary | ICD-10-CM | POA: Diagnosis not present

## 2022-10-01 DIAGNOSIS — D631 Anemia in chronic kidney disease: Secondary | ICD-10-CM | POA: Diagnosis not present

## 2022-10-01 DIAGNOSIS — N2581 Secondary hyperparathyroidism of renal origin: Secondary | ICD-10-CM | POA: Diagnosis not present

## 2022-10-01 DIAGNOSIS — I770 Arteriovenous fistula, acquired: Secondary | ICD-10-CM | POA: Diagnosis not present

## 2022-10-02 ENCOUNTER — Ambulatory Visit (HOSPITAL_COMMUNITY)
Admission: RE | Admit: 2022-10-02 | Discharge: 2022-10-02 | Disposition: A | Discharge status: Home / Self Care | Payer: Medicare Other | Source: Ambulatory Visit | Attending: Nephrology | Admitting: Nephrology

## 2022-10-02 VITALS — BP 137/61 | HR 69 | Temp 97.0°F | Resp 18

## 2022-10-02 DIAGNOSIS — D631 Anemia in chronic kidney disease: Secondary | ICD-10-CM | POA: Insufficient documentation

## 2022-10-02 DIAGNOSIS — N184 Chronic kidney disease, stage 4 (severe): Secondary | ICD-10-CM

## 2022-10-02 LAB — FERRITIN: Ferritin: 43 ng/mL (ref 11–307)

## 2022-10-02 LAB — POCT HEMOGLOBIN-HEMACUE: Hemoglobin: 9.2 g/dL — ABNORMAL LOW (ref 12.0–15.0)

## 2022-10-02 LAB — IRON AND TIBC
Iron: 46 ug/dL (ref 28–170)
Saturation Ratios: 21 % (ref 10.4–31.8)
TIBC: 224 ug/dL — ABNORMAL LOW (ref 250–450)
UIBC: 178 ug/dL

## 2022-10-02 MED ORDER — DARBEPOETIN ALFA 40 MCG/0.4ML IJ SOSY
40.0000 ug | PREFILLED_SYRINGE | INTRAMUSCULAR | Status: DC
  Administered 2022-10-02: 40 ug via SUBCUTANEOUS

## 2022-10-02 MED ORDER — DARBEPOETIN ALFA 40 MCG/0.4ML IJ SOSY
PREFILLED_SYRINGE | INTRAMUSCULAR | Status: AC
  Filled 2022-10-02: qty 0.4

## 2022-10-04 ENCOUNTER — Other Ambulatory Visit: Payer: Self-pay

## 2022-10-04 NOTE — Progress Notes (Signed)
[name] denies chest pain or shortness of breath.  Patient denies having any s/s of Covid in her household, also denies any known exposure to Covid or any s/s of upper or lower respiratory in the past 8 weeks.   [name] cardiologist is Dr. Arnell Sieving, patient is currently wearing a cardiac monitor,this is the last day.

## 2022-10-05 ENCOUNTER — Encounter (HOSPITAL_COMMUNITY)
Admission: RE | Disposition: A | Discharge status: Home / Self Care | Payer: Self-pay | Source: Home / Self Care | Attending: Vascular Surgery

## 2022-10-05 ENCOUNTER — Ambulatory Visit (HOSPITAL_COMMUNITY): Payer: Medicare Other | Admitting: Certified Registered Nurse Anesthetist

## 2022-10-05 ENCOUNTER — Ambulatory Visit (HOSPITAL_BASED_OUTPATIENT_CLINIC_OR_DEPARTMENT_OTHER): Payer: Medicare Other | Admitting: Certified Registered Nurse Anesthetist

## 2022-10-05 ENCOUNTER — Other Ambulatory Visit: Payer: Self-pay

## 2022-10-05 ENCOUNTER — Ambulatory Visit (HOSPITAL_BASED_OUTPATIENT_CLINIC_OR_DEPARTMENT_OTHER)
Admission: RE | Admit: 2022-10-05 | Discharge: 2022-10-05 | Disposition: A | Discharge status: Home / Self Care | Payer: Medicare Other | Source: Home / Self Care | Attending: Vascular Surgery | Admitting: Vascular Surgery

## 2022-10-05 ENCOUNTER — Encounter (HOSPITAL_COMMUNITY): Payer: Self-pay | Admitting: Vascular Surgery

## 2022-10-05 DIAGNOSIS — Z823 Family history of stroke: Secondary | ICD-10-CM | POA: Diagnosis not present

## 2022-10-05 DIAGNOSIS — N184 Chronic kidney disease, stage 4 (severe): Secondary | ICD-10-CM | POA: Insufficient documentation

## 2022-10-05 DIAGNOSIS — J157 Pneumonia due to Mycoplasma pneumoniae: Secondary | ICD-10-CM | POA: Diagnosis not present

## 2022-10-05 DIAGNOSIS — J90 Pleural effusion, not elsewhere classified: Secondary | ICD-10-CM | POA: Diagnosis not present

## 2022-10-05 DIAGNOSIS — G9341 Metabolic encephalopathy: Secondary | ICD-10-CM | POA: Diagnosis not present

## 2022-10-05 DIAGNOSIS — D649 Anemia, unspecified: Secondary | ICD-10-CM | POA: Insufficient documentation

## 2022-10-05 DIAGNOSIS — R8271 Bacteriuria: Secondary | ICD-10-CM | POA: Diagnosis not present

## 2022-10-05 DIAGNOSIS — D638 Anemia in other chronic diseases classified elsewhere: Secondary | ICD-10-CM | POA: Diagnosis not present

## 2022-10-05 DIAGNOSIS — Z48812 Encounter for surgical aftercare following surgery on the circulatory system: Secondary | ICD-10-CM | POA: Diagnosis not present

## 2022-10-05 DIAGNOSIS — I129 Hypertensive chronic kidney disease with stage 1 through stage 4 chronic kidney disease, or unspecified chronic kidney disease: Secondary | ICD-10-CM | POA: Insufficient documentation

## 2022-10-05 DIAGNOSIS — N185 Chronic kidney disease, stage 5: Secondary | ICD-10-CM

## 2022-10-05 DIAGNOSIS — E785 Hyperlipidemia, unspecified: Secondary | ICD-10-CM | POA: Diagnosis present

## 2022-10-05 DIAGNOSIS — Z87891 Personal history of nicotine dependence: Secondary | ICD-10-CM | POA: Diagnosis not present

## 2022-10-05 DIAGNOSIS — Z1152 Encounter for screening for COVID-19: Secondary | ICD-10-CM | POA: Diagnosis not present

## 2022-10-05 DIAGNOSIS — Z96642 Presence of left artificial hip joint: Secondary | ICD-10-CM | POA: Diagnosis present

## 2022-10-05 DIAGNOSIS — D631 Anemia in chronic kidney disease: Secondary | ICD-10-CM

## 2022-10-05 DIAGNOSIS — D72829 Elevated white blood cell count, unspecified: Secondary | ICD-10-CM | POA: Diagnosis not present

## 2022-10-05 DIAGNOSIS — Z881 Allergy status to other antibiotic agents status: Secondary | ICD-10-CM | POA: Diagnosis not present

## 2022-10-05 DIAGNOSIS — D759 Disease of blood and blood-forming organs, unspecified: Secondary | ICD-10-CM | POA: Insufficient documentation

## 2022-10-05 DIAGNOSIS — E039 Hypothyroidism, unspecified: Secondary | ICD-10-CM | POA: Insufficient documentation

## 2022-10-05 DIAGNOSIS — D62 Acute posthemorrhagic anemia: Secondary | ICD-10-CM | POA: Diagnosis present

## 2022-10-05 DIAGNOSIS — I5189 Other ill-defined heart diseases: Secondary | ICD-10-CM | POA: Diagnosis not present

## 2022-10-05 DIAGNOSIS — J168 Pneumonia due to other specified infectious organisms: Secondary | ICD-10-CM | POA: Diagnosis not present

## 2022-10-05 DIAGNOSIS — I10 Essential (primary) hypertension: Secondary | ICD-10-CM | POA: Diagnosis not present

## 2022-10-05 DIAGNOSIS — R4182 Altered mental status, unspecified: Secondary | ICD-10-CM | POA: Diagnosis not present

## 2022-10-05 DIAGNOSIS — M8668 Other chronic osteomyelitis, other site: Secondary | ICD-10-CM | POA: Diagnosis present

## 2022-10-05 DIAGNOSIS — M8580 Other specified disorders of bone density and structure, unspecified site: Secondary | ICD-10-CM | POA: Diagnosis present

## 2022-10-05 DIAGNOSIS — I6381 Other cerebral infarction due to occlusion or stenosis of small artery: Secondary | ICD-10-CM | POA: Diagnosis not present

## 2022-10-05 DIAGNOSIS — J45909 Unspecified asthma, uncomplicated: Secondary | ICD-10-CM | POA: Diagnosis present

## 2022-10-05 DIAGNOSIS — E162 Hypoglycemia, unspecified: Secondary | ICD-10-CM | POA: Diagnosis not present

## 2022-10-05 DIAGNOSIS — Y838 Other surgical procedures as the cause of abnormal reaction of the patient, or of later complication, without mention of misadventure at the time of the procedure: Secondary | ICD-10-CM | POA: Diagnosis present

## 2022-10-05 DIAGNOSIS — Z8249 Family history of ischemic heart disease and other diseases of the circulatory system: Secondary | ICD-10-CM | POA: Diagnosis not present

## 2022-10-05 DIAGNOSIS — Z8673 Personal history of transient ischemic attack (TIA), and cerebral infarction without residual deficits: Secondary | ICD-10-CM | POA: Diagnosis not present

## 2022-10-05 DIAGNOSIS — Z79899 Other long term (current) drug therapy: Secondary | ICD-10-CM | POA: Diagnosis not present

## 2022-10-05 DIAGNOSIS — R7401 Elevation of levels of liver transaminase levels: Secondary | ICD-10-CM | POA: Diagnosis not present

## 2022-10-05 DIAGNOSIS — M8660 Other chronic osteomyelitis, unspecified site: Secondary | ICD-10-CM | POA: Diagnosis not present

## 2022-10-05 DIAGNOSIS — N189 Chronic kidney disease, unspecified: Secondary | ICD-10-CM | POA: Diagnosis not present

## 2022-10-05 DIAGNOSIS — N179 Acute kidney failure, unspecified: Secondary | ICD-10-CM | POA: Diagnosis not present

## 2022-10-05 DIAGNOSIS — Z96653 Presence of artificial knee joint, bilateral: Secondary | ICD-10-CM | POA: Diagnosis present

## 2022-10-05 HISTORY — PX: BASCILIC VEIN TRANSPOSITION: SHX5742

## 2022-10-05 LAB — POCT I-STAT, CHEM 8
BUN: 67 mg/dL — ABNORMAL HIGH (ref 8–23)
Calcium, Ion: 1.12 mmol/L — ABNORMAL LOW (ref 1.15–1.40)
Chloride: 103 mmol/L (ref 98–111)
Creatinine, Ser: 2.7 mg/dL — ABNORMAL HIGH (ref 0.44–1.00)
Glucose, Bld: 92 mg/dL (ref 70–99)
HCT: 28 % — ABNORMAL LOW (ref 36.0–46.0)
Hemoglobin: 9.5 g/dL — ABNORMAL LOW (ref 12.0–15.0)
Potassium: 5.4 mmol/L — ABNORMAL HIGH (ref 3.5–5.1)
Sodium: 138 mmol/L (ref 135–145)
TCO2: 30 mmol/L (ref 22–32)

## 2022-10-05 SURGERY — TRANSPOSITION, VEIN, BASILIC
Anesthesia: General | Site: Arm Upper | Laterality: Left

## 2022-10-05 MED ORDER — PHENYLEPHRINE 80 MCG/ML (10ML) SYRINGE FOR IV PUSH (FOR BLOOD PRESSURE SUPPORT)
PREFILLED_SYRINGE | INTRAVENOUS | Status: DC | PRN
  Administered 2022-10-05 (×2): 160 ug via INTRAVENOUS

## 2022-10-05 MED ORDER — VANCOMYCIN HCL IN DEXTROSE 1-5 GM/200ML-% IV SOLN
1000.0000 mg | INTRAVENOUS | Status: AC
  Administered 2022-10-05: 1000 mg via INTRAVENOUS
  Filled 2022-10-05: qty 200

## 2022-10-05 MED ORDER — VASOPRESSIN 20 UNIT/ML IV SOLN
INTRAVENOUS | Status: AC
  Filled 2022-10-05: qty 1

## 2022-10-05 MED ORDER — HYDROCODONE-ACETAMINOPHEN 5-325 MG PO TABS: 1.0000 | ORAL_TABLET | Freq: Four times a day (QID) | ORAL | 0 refills | Status: DC | PRN

## 2022-10-05 MED ORDER — CHLORHEXIDINE GLUCONATE 4 % EX SOLN: 60.0000 mL | Freq: Once | CUTANEOUS | Status: DC

## 2022-10-05 MED ORDER — AMISULPRIDE (ANTIEMETIC) 5 MG/2ML IV SOLN
INTRAVENOUS | Status: AC
  Filled 2022-10-05: qty 4

## 2022-10-05 MED ORDER — ACETAMINOPHEN 500 MG PO TABS: 1000.0000 mg | ORAL_TABLET | Freq: Once | ORAL | Status: DC | PRN

## 2022-10-05 MED ORDER — HEPARIN 6000 UNIT IRRIGATION SOLUTION
Status: AC
  Filled 2022-10-05: qty 500

## 2022-10-05 MED ORDER — FENTANYL CITRATE (PF) 250 MCG/5ML IJ SOLN
INTRAMUSCULAR | Status: AC
  Filled 2022-10-05: qty 5

## 2022-10-05 MED ORDER — 0.9 % SODIUM CHLORIDE (POUR BTL) OPTIME
TOPICAL | Status: DC | PRN
  Administered 2022-10-05: 1000 mL

## 2022-10-05 MED ORDER — FENTANYL CITRATE (PF) 100 MCG/2ML IJ SOLN
INTRAMUSCULAR | Status: AC
  Filled 2022-10-05: qty 2

## 2022-10-05 MED ORDER — OXYCODONE HCL 5 MG PO TABS
ORAL_TABLET | ORAL | Status: AC
  Filled 2022-10-05: qty 1

## 2022-10-05 MED ORDER — ONDANSETRON HCL 4 MG/2ML IJ SOLN
INTRAMUSCULAR | Status: DC | PRN
  Administered 2022-10-05: 4 mg via INTRAVENOUS

## 2022-10-05 MED ORDER — AMISULPRIDE (ANTIEMETIC) 5 MG/2ML IV SOLN
10.0000 mg | Freq: Once | INTRAVENOUS | Status: AC | PRN
  Administered 2022-10-05: 10 mg via INTRAVENOUS

## 2022-10-05 MED ORDER — HEPARIN 6000 UNIT IRRIGATION SOLUTION
Status: DC | PRN
  Administered 2022-10-05: 1

## 2022-10-05 MED ORDER — ORAL CARE MOUTH RINSE: 15.0000 mL | Freq: Once | OROMUCOSAL | Status: DC

## 2022-10-05 MED ORDER — FENTANYL CITRATE (PF) 250 MCG/5ML IJ SOLN
INTRAMUSCULAR | Status: DC | PRN
  Administered 2022-10-05 (×2): 25 ug via INTRAVENOUS
  Administered 2022-10-05: 50 ug via INTRAVENOUS

## 2022-10-05 MED ORDER — PROPOFOL 10 MG/ML IV BOLUS
INTRAVENOUS | Status: DC | PRN
  Administered 2022-10-05: 90 mg via INTRAVENOUS

## 2022-10-05 MED ORDER — FENTANYL CITRATE (PF) 100 MCG/2ML IJ SOLN
25.0000 ug | INTRAMUSCULAR | Status: DC | PRN
  Administered 2022-10-05: 25 ug via INTRAVENOUS

## 2022-10-05 MED ORDER — PHENYLEPHRINE HCL-NACL 20-0.9 MG/250ML-% IV SOLN
INTRAVENOUS | Status: DC | PRN
  Administered 2022-10-05: 50 ug/min via INTRAVENOUS

## 2022-10-05 MED ORDER — LIDOCAINE HCL (PF) 1 % IJ SOLN
INTRAMUSCULAR | Status: AC
  Filled 2022-10-05: qty 30

## 2022-10-05 MED ORDER — ALBUMIN HUMAN 5 % IV SOLN: INTRAVENOUS | Status: DC | PRN

## 2022-10-05 MED ORDER — SODIUM CHLORIDE 0.9 % IV SOLN: INTRAVENOUS | Status: DC

## 2022-10-05 MED ORDER — ACETAMINOPHEN 10 MG/ML IV SOLN
INTRAVENOUS | Status: AC
  Filled 2022-10-05: qty 100

## 2022-10-05 MED ORDER — ACETAMINOPHEN 160 MG/5ML PO SOLN: 1000.0000 mg | Freq: Once | ORAL | Status: DC | PRN

## 2022-10-05 MED ORDER — FENTANYL CITRATE (PF) 100 MCG/2ML IJ SOLN: 25.0000 ug | INTRAMUSCULAR | Status: DC | PRN

## 2022-10-05 MED ORDER — DEXAMETHASONE SODIUM PHOSPHATE 10 MG/ML IJ SOLN
INTRAMUSCULAR | Status: DC | PRN
  Administered 2022-10-05: 5 mg via INTRAVENOUS

## 2022-10-05 MED ORDER — VASOPRESSIN 20 UNIT/ML IV SOLN
INTRAVENOUS | Status: DC | PRN
  Administered 2022-10-05: 2 [IU] via INTRAVENOUS

## 2022-10-05 MED ORDER — CHLORHEXIDINE GLUCONATE 0.12 % MT SOLN
15.0000 mL | Freq: Once | OROMUCOSAL | Status: DC
  Filled 2022-10-05: qty 15

## 2022-10-05 MED ORDER — OXYCODONE HCL 5 MG/5ML PO SOLN: 5.0000 mg | Freq: Once | ORAL | Status: AC | PRN

## 2022-10-05 MED ORDER — OXYCODONE HCL 5 MG PO TABS
5.0000 mg | ORAL_TABLET | Freq: Once | ORAL | Status: AC | PRN
  Administered 2022-10-05: 5 mg via ORAL

## 2022-10-05 MED ORDER — LIDOCAINE 2% (20 MG/ML) 5 ML SYRINGE
INTRAMUSCULAR | Status: DC | PRN
  Administered 2022-10-05: 40 mg via INTRAVENOUS

## 2022-10-05 MED ORDER — PROPOFOL 500 MG/50ML IV EMUL
INTRAVENOUS | Status: DC | PRN
  Administered 2022-10-05: 150 ug/kg/min via INTRAVENOUS
  Administered 2022-10-05: 100 ug/kg/min via INTRAVENOUS

## 2022-10-05 MED ORDER — ACETAMINOPHEN 10 MG/ML IV SOLN: 1000.0000 mg | Freq: Once | INTRAVENOUS | Status: DC | PRN

## 2022-10-05 MED ORDER — HEMOSTATIC AGENTS (NO CHARGE) OPTIME
TOPICAL | Status: DC | PRN
  Administered 2022-10-05: 1 via TOPICAL

## 2022-10-05 MED ORDER — HEPARIN SODIUM (PORCINE) 1000 UNIT/ML IJ SOLN
INTRAMUSCULAR | Status: DC | PRN
  Administered 2022-10-05: 3000 [IU] via INTRAVENOUS

## 2022-10-05 MED ORDER — ACETAMINOPHEN 10 MG/ML IV SOLN
INTRAVENOUS | Status: DC | PRN
  Administered 2022-10-05: 1000 mg via INTRAVENOUS

## 2022-10-05 SURGICAL SUPPLY — 43 items
ADH SKN CLS APL DERMABOND .7 (GAUZE/BANDAGES/DRESSINGS) ×2
AGENT HMST SPONGE THK3/8 (HEMOSTASIS)
ARMBAND PINK RESTRICT EXTREMIT (MISCELLANEOUS) ×2 IMPLANT
BAG COUNTER SPONGE SURGICOUNT (BAG) ×2 IMPLANT
BAG SPNG CNTER NS LX DISP (BAG) ×1
CANISTER SUCT 3000ML PPV (MISCELLANEOUS) ×2 IMPLANT
CLIP TI MEDIUM 24 (CLIP) ×2 IMPLANT
CLIP TI WIDE RED SMALL 24 (CLIP) ×2 IMPLANT
COVER PROBE W GEL 5X96 (DRAPES) ×2 IMPLANT
DERMABOND ADVANCED .7 DNX12 (GAUZE/BANDAGES/DRESSINGS) ×2 IMPLANT
ELECT REM PT RETURN 9FT ADLT (ELECTROSURGICAL) ×1
ELECTRODE REM PT RTRN 9FT ADLT (ELECTROSURGICAL) ×2 IMPLANT
GLOVE BIO SURGEON STRL SZ7.5 (GLOVE) ×2 IMPLANT
GLOVE BIOGEL PI IND STRL 8 (GLOVE) ×2 IMPLANT
GOWN STRL REUS W/ TWL LRG LVL3 (GOWN DISPOSABLE) ×4 IMPLANT
GOWN STRL REUS W/ TWL XL LVL3 (GOWN DISPOSABLE) ×4 IMPLANT
GOWN STRL REUS W/TWL LRG LVL3 (GOWN DISPOSABLE) ×2
GOWN STRL REUS W/TWL XL LVL3 (GOWN DISPOSABLE) ×2
HEMOSTAT SPONGE AVITENE ULTRA (HEMOSTASIS) IMPLANT
KIT BASIN OR (CUSTOM PROCEDURE TRAY) ×2 IMPLANT
KIT TURNOVER KIT B (KITS) ×2 IMPLANT
LOOP VASCULAR MINI 18 RED (MISCELLANEOUS) ×1
NDL HYPO 25GX1X1/2 BEV (NEEDLE) ×2 IMPLANT
NEEDLE HYPO 25GX1X1/2 BEV (NEEDLE) ×1 IMPLANT
NS IRRIG 1000ML POUR BTL (IV SOLUTION) ×2 IMPLANT
PACK CV ACCESS (CUSTOM PROCEDURE TRAY) ×2 IMPLANT
PAD ARMBOARD 7.5X6 YLW CONV (MISCELLANEOUS) ×4 IMPLANT
POWDER SURGICEL 3.0 GRAM (HEMOSTASIS) IMPLANT
SLING ARM FOAM STRAP LRG (SOFTGOODS) IMPLANT
SLING ARM FOAM STRAP MED (SOFTGOODS) IMPLANT
SPIKE FLUID TRANSFER (MISCELLANEOUS) ×2 IMPLANT
SUT MNCRL AB 4-0 PS2 18 (SUTURE) ×2 IMPLANT
SUT PROLENE 6 0 BV (SUTURE) ×2 IMPLANT
SUT PROLENE 7 0 BV 1 (SUTURE) IMPLANT
SUT SILK 2 0 SH (SUTURE) IMPLANT
SUT VIC AB 2-0 CT1 27 (SUTURE) ×1
SUT VIC AB 2-0 CT1 TAPERPNT 27 (SUTURE) ×2 IMPLANT
SUT VIC AB 3-0 SH 27 (SUTURE) ×5
SUT VIC AB 3-0 SH 27X BRD (SUTURE) ×4 IMPLANT
TOWEL GREEN STERILE (TOWEL DISPOSABLE) ×2 IMPLANT
UNDERPAD 30X36 HEAVY ABSORB (UNDERPADS AND DIAPERS) ×2 IMPLANT
VASCULAR TIE MINI RED 18IN STL (MISCELLANEOUS) IMPLANT
WATER STERILE IRR 1000ML POUR (IV SOLUTION) ×2 IMPLANT

## 2022-10-05 NOTE — Anesthesia Procedure Notes (Signed)
Procedure Name: LMA Insertion Date/Time: 10/05/2022 7:54 AM  Performed by: Alvera Novel, CRNAPre-anesthesia Checklist: Patient identified, Emergency Drugs available, Suction available and Patient being monitored Patient Re-evaluated:Patient Re-evaluated prior to induction Oxygen Delivery Method: Circle System Utilized Preoxygenation: Pre-oxygenation with 100% oxygen Induction Type: IV induction Ventilation: Mask ventilation without difficulty LMA: LMA inserted LMA Size: 3.0 Number of attempts: 1 Placement Confirmation: positive ETCO2 Tube secured with: Tape Dental Injury: Teeth and Oropharynx as per pre-operative assessment

## 2022-10-05 NOTE — Transfer of Care (Signed)
Immediate Anesthesia Transfer of Care Note  Patient: Linda Rice  Procedure(s) Performed: LEFT SECOND STAGE BASILIC VEIN TRANSPOSITION (Left: Arm Upper)  Patient Location: PACU  Anesthesia Type:General  Level of Consciousness: awake, alert , and oriented  Airway & Oxygen Therapy: Patient Spontanous Breathing  Post-op Assessment: Report given to RN and Post -op Vital signs reviewed and stable  Post vital signs: Reviewed and stable  Last Vitals:  Vitals Value Taken Time  BP 147/51 10/05/22 0956  Temp    Pulse 61 10/05/22 0959  Resp 13 10/05/22 0959  SpO2 99 % 10/05/22 0959  Vitals shown include unvalidated device data.  Last Pain:  Vitals:   10/05/22 0643  TempSrc:   PainSc: 0-No pain         Complications: No notable events documented.

## 2022-10-05 NOTE — Op Note (Signed)
    OPERATIVE NOTE  DATE: October 05, 2022  PROCEDURE: left second stage basilic vein transposition (brachiobasilic arteriovenous fistula) placement  PRE-OPERATIVE DIAGNOSIS: Stage IV CKD  POST-OPERATIVE DIAGNOSIS: same  SURGEON: Cephus Shelling, MD  ASSISTANT(S): Nathanial Rancher, PA  ANESTHESIA:  LMA  ESTIMATED BLOOD LOSS: Minimal  FINDING(S): Left arm basilic vein was of excellent caliber.  This was transposed through three skip incisions.  It was transected near the antecubital fossa and a new skin tunnel was created.  A new anastomosis was performed.  Excellent thrill at completion.  SPECIMEN(S):  None  INDICATIONS:   Linda Rice is a 82 y.o. female who presents with stage IV CKD and need for left arm second stage basilic vein.  The patient is aware the risks include but are not limited to: bleeding, infection, steal syndrome, nerve damage, ischemic monomelic neuropathy, failure to mature, and need for additional procedures.  The patient is aware of the risks of the procedure and elects to proceed forward.  DESCRIPTION: After full informed written consent was obtained from the patient, the patient was brought back to the operating room and placed supine upon the operating table.  Prior to induction, the patient received IV antibiotics.   After obtaining adequate anesthesia, the patient was then prepped and draped in the standard fashion for a left arm access procedure.  I turned my attention first to identifying the patient's brachiobasilic arteriovenous fistula.  Using SonoSite guidance, the location of this fistula was marked out on the skin.    This was an excellent caliber vein.  I made three longitudinal incisions on the medial aspect of the left upper arm.  Through these incisions I dissected out circumferentially the basilic vein, taking care to protect the nerve.  Once the vein was fully mobilized, all side branches were ligated between silk ties.  The vein was  marked for orientation.  I then used a curved tunneler to create a subcutaneous tunnel.  The patient was given 3,000 units IV heparin.  The vein was then transected near the antecubital crease.  It was then brought to the previously created tunnel making sure to maintain proper orientation.  A primary anastomosis was then performed between the two cut ends of the vein with a running 6-0 Prolene after each end was spatulated.  Once this was done the clamps were released.  There was excellent flow through the fistula.  Hemostasis was then achieved.  The wound was irrigated.  Surgicel powder was placed in the wound.  The incision was closed with a deep layer of 3-0 Vicryl followed by a subcutaneous 4-0 Monocryl and Dermabond.  There were no immediate complications.  COMPLICATIONS: None  CONDITION: Stable  Cephus Shelling, MD [facility] Office: [phone]  Cephus Shelling   10/05/2022, 9:51 AM

## 2022-10-05 NOTE — Anesthesia Preprocedure Evaluation (Addendum)
Anesthesia Evaluation  Patient identified by MRN, date of birth, ID band Patient awake    Reviewed: Allergy & Precautions, NPO status , Patient's Chart, lab work & pertinent test results  History of Anesthesia Complications Negative for: history of anesthetic complications  Airway Mallampati: III  TM Distance: >3 FB Neck ROM: Full    Dental  (+) Teeth Intact, Dental Advisory Given   Pulmonary neg shortness of breath, asthma , neg sleep apnea, neg COPD, neg recent URI, former smoker   breath sounds clear to auscultation       Cardiovascular hypertension, (-) angina (-) Past MI and (-) CHF  Rhythm:Regular   - Left ventricle: The cavity size was normal. Wall thickness was    normal. Wall motion was normal; there were no regional wall    motion abnormalities.  - Right atrium: The atrium was mildly dilated.  - Pulmonary arteries: Systolic pressure was mildly increased. PA    peak pressure: 34 mm Hg (S).     Neuro/Psych  PSYCHIATRIC DISORDERS Anxiety Depression    negative neurological ROS     GI/Hepatic   Endo/Other  Hypothyroidism    Renal/GU CRFRenal diseaseLab Results      Component                Value               Date                      CREATININE               2.50 (H)            08/06/2022          '   Lab Results      Component                Value               Date                      K                        3.8                 08/06/2022                Musculoskeletal  (+) Arthritis ,    Abdominal   Peds  Hematology  (+) Blood dyscrasia, anemia Lab Results      Component                Value               Date                      WBC                      8.6                 06/16/2020                HGB                      9.2 (L)             10/02/2022  HCT                      36.0                08/06/2022                MCV                      90                  06/16/2020                 PLT                      374                 06/16/2020              Anesthesia Other Findings   Reproductive/Obstetrics                              Anesthesia Physical Anesthesia Plan  ASA: 3  Anesthesia Plan: General   Post-op Pain Management: Ofirmev IV (intra-op)*   Induction: Intravenous  PONV Risk Score and Plan: 3 and Ondansetron and Dexamethasone  Airway Management Planned: LMA  Additional Equipment: None  Intra-op Plan:   Post-operative Plan: Extubation in OR  Informed Consent: I have reviewed the patients History and Physical, chart, labs and discussed the procedure including the risks, benefits and alternatives for the proposed anesthesia with the patient or authorized representative who has indicated his/her understanding and acceptance.     Dental advisory given  Plan Discussed with: CRNA  Anesthesia Plan Comments:          Anesthesia Quick Evaluation

## 2022-10-05 NOTE — Anesthesia Postprocedure Evaluation (Signed)
Anesthesia Post Note  Patient: Ronell Boldin  Procedure(s) Performed: LEFT SECOND STAGE BASILIC VEIN TRANSPOSITION (Left: Arm Upper)     Patient location during evaluation: PACU Anesthesia Type: General Level of consciousness: awake and alert Pain management: pain level controlled Vital Signs Assessment: post-procedure vital signs reviewed and stable Respiratory status: spontaneous breathing, nonlabored ventilation and respiratory function stable Cardiovascular status: blood pressure returned to baseline and stable Postop Assessment: no apparent nausea or vomiting Anesthetic complications: no   No notable events documented.  Last Vitals:  Vitals:   10/05/22 1015 10/05/22 1030  BP: (!) 149/54 (!) 146/60  Pulse: 61 (!) 58  Resp: 20 18  Temp:  36.7 C  SpO2: 99% 99%    Last Pain:  Vitals:   10/05/22 1030  TempSrc:   PainSc: 2                  Larsen Zettel

## 2022-10-05 NOTE — Discharge Instructions (Signed)
[facility] [place]  Discharge Instructions  AV Fistula or Graft Surgery for Dialysis Access  Please refer to the following instructions for your post-procedure care. Your surgeon or physician assistant will discuss any changes with you.  Activity  You may drive the day following your surgery, if you are comfortable and no longer taking prescription pain medication. Resume full activity as the soreness in your incision resolves.  Bathing/Showering  You may shower after you go home. Keep your incision dry for 48 hours. Do not soak in a bathtub, hot tub, or swim until the incision heals completely. You may not shower if you have a hemodialysis catheter.  Incision Care  Clean your incision with mild soap and water after 48 hours. Pat the area dry with a clean towel. You do not need a bandage unless otherwise instructed. Do not apply any ointments or creams to your incision. You may have skin glue on your incision. Do not peel it off. It will come off on its own in about one week. Your arm may swell a bit after surgery. To reduce swelling use pillows to elevate your arm so it is above your heart. Your doctor will tell you if you need to lightly wrap your arm with an ACE bandage.  Diet  Resume your normal diet. There are not special food restrictions following this procedure. In order to heal from your surgery, it is CRITICAL to get adequate nutrition. Your body requires vitamins, minerals, and protein. Vegetables are the best source of vitamins and minerals. Vegetables also provide the perfect balance of protein. Processed food has little nutritional value, so try to avoid this.  Medications  Resume taking all of your medications. If your incision is causing pain, you may take over-the counter pain relievers such as acetaminophen (Tylenol). If you were prescribed a stronger pain medication, please be aware these medications can cause nausea and constipation. Prevent  nausea by taking the medication with a snack or meal. Avoid constipation by drinking plenty of fluids and eating foods with high amount of fiber, such as fruits, vegetables, and grains.  Do not take Tylenol if you are taking prescription pain medications.  Follow up Your surgeon may want to see you in the office following your access surgery. If so, this will be arranged at the time of your surgery.  Please call us immediately for any of the following conditions:  Increased pain, redness, drainage (pus) from your incision site Fever of 101 degrees or higher Severe or worsening pain at your incision site Hand pain or numbness.  Reduce your risk of vascular disease:  Stop smoking. If you would like help, call [facility] at 1-800-QUIT-NOW (228-464-2120) or [place] at [phone]  Manage your cholesterol Maintain a desired weight Control your diabetes Keep your blood pressure down  Dialysis  It will take several weeks to several months for your new dialysis access to be ready for use. Your surgeon will determine when it is okay to use it. Your nephrologist will continue to direct your dialysis. You can continue to use your Permcath until your new access is ready for use.   10/05/2022 Linda Rice [id number] [birth year]  Surgeon(s): Cephus Shelling, MD  Procedure(s): LEFT SECOND STAGE BASILIC VEIN TRANSPOSITION   May stick graft immediately   May stick graft on designated area only:   X Do not stick Left AV fistula for 6 weeks    If you have any questions, please call the  office at [phone].

## 2022-10-05 NOTE — H&P (Signed)
History and Physical Interval Note:  10/05/2022 7:28 AM  Linda Rice  has presented today for surgery, with the diagnosis of CHRONIC KIDNEY DISEASE IV.  The various methods of treatment have been discussed with the patient and family. After consideration of risks, benefits and other options for treatment, the patient has consented to  Procedure(s): LEFT SECOND STAGE BASILIC VEIN TRANSPOSITION (Left) as a surgical intervention.  [name] history has been reviewed, patient examined, no change in status, stable for surgery.  I have reviewed the patient's chart and labs.  Questions were answered to the patient's satisfaction.    Left 2nd stage BVT.  Cephus Shelling   POST OPERATIVE OFFICE NOTE       CC:  F/u for surgery   HPI:  This is a 82 y.o. female who is s/p left 1st stage BVT on 08/06/2022 by Dr. Chestine Spore.    Pt states she does not have pain/numbness in the left hand.     The pt is not yet on dialysis.  She states that she recently travelled overseas to [place] and was hospitalized.  She had fallen and hit her head.  She was not on blood thinners.  She states that her electrolytes were imbalanced.  There was some mention of afib, however, she has seen Dr. Joaquim Nam since being back.  He has her on on holter monitor.  Per pt, he feels she is stable.            Allergies  Allergen Reactions   Egg-Derived Products Hives      eczema Poultry Rash and GI symptoms   Erythromycin Other (See Comments)      REACTION: stomach and hives REACTION: stomach symptoms  and hives   Fish Allergy Other (See Comments) and Swelling      Lip swelling and eczema With fins hives     Gatifloxacin Hives, Nausea Only and Swelling   Other Anaphylaxis, Itching and Swelling      Legumes- all beans and peas And tree nuts  Lip swelling and eczema Legumes- all beans and peas, peanuts Orange Vegetables     Peanut-Containing Drug Products Anaphylaxis      And tree nuts    Poultry Meal  Diarrhea and Itching   Banana Itching   Keflex [Cephalexin] Rash   Latex Itching            Current Outpatient Medications  Medication Sig Dispense Refill   albuterol (VENTOLIN HFA) 108 (90 Base) MCG/ACT inhaler Inhale 1-2 puffs into the lungs every 6 (six) hours as needed for wheezing or shortness of breath. (Patient taking differently: Inhale 1-2 puffs into the lungs every 6 (six) hours as needed (Asthma).) 18 g 1   atorvastatin (LIPITOR) 10 MG tablet Take 10 mg by mouth in the morning.       cetirizine (ZYRTEC) 10 MG tablet Take 10 mg by mouth daily. [place] 24 hr       Darbepoetin Alfa (ARANESP) 40 MCG/0.4ML SOSY injection Inject 40 mcg into the skin See admin instructions. Every 4 weeks       doxycycline (VIBRAMYCIN) 100 MG capsule Take 100 mg by mouth 2 (two) times daily.   4   EPINEPHrine 0.3 mg/0.3 mL IJ SOAJ injection Inject 0.3 mLs (0.3 mg total) into the muscle as needed for anaphylaxis. 2 each 1   HYDROcodone-acetaminophen (NORCO) 5-325 MG tablet Take 1 tablet by mouth every 6 (six) hours as needed for moderate pain. 10 tablet 0  Levothyroxine Sodium 88 MCG CAPS Take 88 mcg by mouth daily before breakfast.       metoprolol tartrate (LOPRESSOR) 25 MG tablet Take 1 tablet (25 mg total) by mouth in the morning, at noon, and at bedtime. 1 tablet 0   mirtazapine (REMERON SOL-TAB) 30 MG disintegrating tablet Take 30 mg by mouth at bedtime.       Multiple Vitamins-Minerals (MULTIVITAMIN WITH MINERALS) tablet Take 1 tablet by mouth daily. Woman       pantoprazole (PROTONIX) 40 MG tablet Take 40 mg by mouth in the morning. (Patient not taking: Reported on 09/21/2022)       sodium bicarbonate 650 MG tablet Take 1,300 mg by mouth 2 (two) times daily.        No current facility-administered medications for this visit.       ROS:  See HPI   Physical Exam:      Today's Vitals    09/25/22 1340  BP: 100/60  Pulse: 70  Resp: 18  Temp: (!) 97.5 F (36.4 C)  TempSrc: Temporal  SpO2:  98%  Weight: 146 lb 14.4 oz (66.6 kg)  Height: 5\' 3"  (1.6 m)  PainSc: 0-No pain    Body mass index is 26.02 kg/m.     Incision:  healed nicely Extremities:   There is a palpable left radial  pulse.   Motor and sensory are in tact.   There is a thrill/bruit present.  Access is  easily palpable     Dialysis Duplex on 09/25/2022: +------------+----------+-------------+----------+--------+  OUTFLOW VEINPSV (cm/s)Diameter (cm)Depth (cm)Describe  +------------+----------+-------------+----------+--------+  Prox UA        107        0.70        0.62             +------------+----------+-------------+----------+--------+  Mid UA         174        0.74        0.74             +------------+----------+-------------+----------+--------+  Dist UA        359        0.52        0.36             +------------+----------+-------------+----------+--------+      Assessment/Plan:  This is a 82 y.o. female who is s/p: left 1st stage BVT on 08/06/2022 by Dr. Chestine Spore   -the pt does not have evidence of steal. -fistula has matured nicely and has an excellent thrill.  Will plan to proceed with 2nd stage.  Pt is currently on a holter monitor for events noted above.  Discussed with Dr. Chestine Spore and he is okay proceeding with 2nd stage as long as Dr. Joaquim Nam is okay for pt to have surgery as we can proceed with a block and use minimal anesthesia if needed.  She is currently no on anticoagulation.  -discussed with pt that access does not last forever and will need intervention or even new access at some point.        Doreatha Massed, [facility] [phone]   Clinic MD:  Chestine Spore

## 2022-10-06 ENCOUNTER — Emergency Department (HOSPITAL_COMMUNITY): Payer: Medicare Other

## 2022-10-06 ENCOUNTER — Encounter (HOSPITAL_COMMUNITY): Payer: Self-pay | Admitting: Vascular Surgery

## 2022-10-06 ENCOUNTER — Inpatient Hospital Stay (HOSPITAL_COMMUNITY)
Admission: EM | Admit: 2022-10-06 | Discharge: 2022-10-09 | DRG: 981 | Disposition: A | Discharge status: Home Health | Payer: Medicare Other | Attending: Internal Medicine | Admitting: Internal Medicine

## 2022-10-06 DIAGNOSIS — M8580 Other specified disorders of bone density and structure, unspecified site: Secondary | ICD-10-CM | POA: Diagnosis present

## 2022-10-06 DIAGNOSIS — Z91012 Allergy to eggs: Secondary | ICD-10-CM

## 2022-10-06 DIAGNOSIS — L659 Nonscarring hair loss, unspecified: Secondary | ICD-10-CM | POA: Diagnosis present

## 2022-10-06 DIAGNOSIS — E785 Hyperlipidemia, unspecified: Secondary | ICD-10-CM | POA: Diagnosis present

## 2022-10-06 DIAGNOSIS — Y838 Other surgical procedures as the cause of abnormal reaction of the patient, or of later complication, without mention of misadventure at the time of the procedure: Secondary | ICD-10-CM | POA: Diagnosis present

## 2022-10-06 DIAGNOSIS — R4182 Altered mental status, unspecified: Secondary | ICD-10-CM | POA: Diagnosis not present

## 2022-10-06 DIAGNOSIS — R8271 Bacteriuria: Secondary | ICD-10-CM | POA: Diagnosis present

## 2022-10-06 DIAGNOSIS — J45909 Unspecified asthma, uncomplicated: Secondary | ICD-10-CM | POA: Diagnosis present

## 2022-10-06 DIAGNOSIS — Z79899 Other long term (current) drug therapy: Secondary | ICD-10-CM

## 2022-10-06 DIAGNOSIS — G9341 Metabolic encephalopathy: Secondary | ICD-10-CM | POA: Diagnosis present

## 2022-10-06 DIAGNOSIS — Z96642 Presence of left artificial hip joint: Secondary | ICD-10-CM | POA: Diagnosis present

## 2022-10-06 DIAGNOSIS — Z83438 Family history of other disorder of lipoprotein metabolism and other lipidemia: Secondary | ICD-10-CM

## 2022-10-06 DIAGNOSIS — E559 Vitamin D deficiency, unspecified: Secondary | ICD-10-CM | POA: Diagnosis present

## 2022-10-06 DIAGNOSIS — Z96653 Presence of artificial knee joint, bilateral: Secondary | ICD-10-CM | POA: Diagnosis present

## 2022-10-06 DIAGNOSIS — Z90721 Acquired absence of ovaries, unilateral: Secondary | ICD-10-CM

## 2022-10-06 DIAGNOSIS — M8668 Other chronic osteomyelitis, other site: Secondary | ICD-10-CM | POA: Diagnosis present

## 2022-10-06 DIAGNOSIS — Z79890 Hormone replacement therapy (postmenopausal): Secondary | ICD-10-CM

## 2022-10-06 DIAGNOSIS — E039 Hypothyroidism, unspecified: Secondary | ICD-10-CM | POA: Diagnosis present

## 2022-10-06 DIAGNOSIS — I6381 Other cerebral infarction due to occlusion or stenosis of small artery: Secondary | ICD-10-CM | POA: Diagnosis not present

## 2022-10-06 DIAGNOSIS — Z91010 Allergy to peanuts: Secondary | ICD-10-CM

## 2022-10-06 DIAGNOSIS — Z1152 Encounter for screening for COVID-19: Secondary | ICD-10-CM

## 2022-10-06 DIAGNOSIS — J157 Pneumonia due to Mycoplasma pneumoniae: Principal | ICD-10-CM | POA: Diagnosis present

## 2022-10-06 DIAGNOSIS — M8660 Other chronic osteomyelitis, unspecified site: Secondary | ICD-10-CM | POA: Diagnosis present

## 2022-10-06 DIAGNOSIS — F419 Anxiety disorder, unspecified: Secondary | ICD-10-CM | POA: Diagnosis present

## 2022-10-06 DIAGNOSIS — Z881 Allergy status to other antibiotic agents status: Secondary | ICD-10-CM

## 2022-10-06 DIAGNOSIS — Z8249 Family history of ischemic heart disease and other diseases of the circulatory system: Secondary | ICD-10-CM

## 2022-10-06 DIAGNOSIS — F4321 Adjustment disorder with depressed mood: Secondary | ICD-10-CM | POA: Diagnosis present

## 2022-10-06 DIAGNOSIS — M1990 Unspecified osteoarthritis, unspecified site: Secondary | ICD-10-CM | POA: Diagnosis present

## 2022-10-06 DIAGNOSIS — Z818 Family history of other mental and behavioral disorders: Secondary | ICD-10-CM

## 2022-10-06 DIAGNOSIS — Z91013 Allergy to seafood: Secondary | ICD-10-CM

## 2022-10-06 DIAGNOSIS — N179 Acute kidney failure, unspecified: Secondary | ICD-10-CM | POA: Diagnosis present

## 2022-10-06 DIAGNOSIS — N189 Chronic kidney disease, unspecified: Secondary | ICD-10-CM | POA: Diagnosis present

## 2022-10-06 DIAGNOSIS — D62 Acute posthemorrhagic anemia: Secondary | ICD-10-CM | POA: Diagnosis present

## 2022-10-06 DIAGNOSIS — N184 Chronic kidney disease, stage 4 (severe): Secondary | ICD-10-CM | POA: Diagnosis present

## 2022-10-06 DIAGNOSIS — Z87891 Personal history of nicotine dependence: Secondary | ICD-10-CM

## 2022-10-06 DIAGNOSIS — E739 Lactose intolerance, unspecified: Secondary | ICD-10-CM | POA: Diagnosis present

## 2022-10-06 DIAGNOSIS — J168 Pneumonia due to other specified infectious organisms: Secondary | ICD-10-CM | POA: Diagnosis not present

## 2022-10-06 DIAGNOSIS — K219 Gastro-esophageal reflux disease without esophagitis: Secondary | ICD-10-CM | POA: Diagnosis present

## 2022-10-06 DIAGNOSIS — S40022A Contusion of left upper arm, initial encounter: Secondary | ICD-10-CM | POA: Diagnosis present

## 2022-10-06 DIAGNOSIS — I10 Essential (primary) hypertension: Secondary | ICD-10-CM | POA: Diagnosis not present

## 2022-10-06 DIAGNOSIS — J90 Pleural effusion, not elsewhere classified: Secondary | ICD-10-CM | POA: Diagnosis present

## 2022-10-06 DIAGNOSIS — D649 Anemia, unspecified: Secondary | ICD-10-CM | POA: Diagnosis present

## 2022-10-06 DIAGNOSIS — E162 Hypoglycemia, unspecified: Secondary | ICD-10-CM | POA: Diagnosis not present

## 2022-10-06 DIAGNOSIS — Z91040 Latex allergy status: Secondary | ICD-10-CM

## 2022-10-06 DIAGNOSIS — D631 Anemia in chronic kidney disease: Secondary | ICD-10-CM | POA: Diagnosis present

## 2022-10-06 DIAGNOSIS — Z8673 Personal history of transient ischemic attack (TIA), and cerebral infarction without residual deficits: Secondary | ICD-10-CM

## 2022-10-06 DIAGNOSIS — I5189 Other ill-defined heart diseases: Secondary | ICD-10-CM

## 2022-10-06 DIAGNOSIS — Z823 Family history of stroke: Secondary | ICD-10-CM

## 2022-10-06 DIAGNOSIS — R7401 Elevation of levels of liver transaminase levels: Secondary | ICD-10-CM | POA: Diagnosis present

## 2022-10-06 DIAGNOSIS — Z91018 Allergy to other foods: Secondary | ICD-10-CM

## 2022-10-06 DIAGNOSIS — Z825 Family history of asthma and other chronic lower respiratory diseases: Secondary | ICD-10-CM

## 2022-10-06 DIAGNOSIS — J189 Pneumonia, unspecified organism: Principal | ICD-10-CM

## 2022-10-06 DIAGNOSIS — I129 Hypertensive chronic kidney disease with stage 1 through stage 4 chronic kidney disease, or unspecified chronic kidney disease: Secondary | ICD-10-CM | POA: Diagnosis present

## 2022-10-06 DIAGNOSIS — D72829 Elevated white blood cell count, unspecified: Secondary | ICD-10-CM | POA: Diagnosis present

## 2022-10-06 LAB — CBC WITH DIFFERENTIAL/PLATELET
Abs Immature Granulocytes: 0.25 10*3/uL — ABNORMAL HIGH (ref 0.00–0.07)
Basophils Absolute: 0.1 10*3/uL (ref 0.0–0.1)
Basophils Relative: 0 %
Eosinophils Absolute: 0.2 10*3/uL (ref 0.0–0.5)
Eosinophils Relative: 1 %
HCT: 26 % — ABNORMAL LOW (ref 36.0–46.0)
Hemoglobin: 8.5 g/dL — ABNORMAL LOW (ref 12.0–15.0)
Immature Granulocytes: 1 %
Lymphocytes Relative: 8 %
Lymphs Abs: 1.8 10*3/uL (ref 0.7–4.0)
MCH: 29.6 pg (ref 26.0–34.0)
MCHC: 32.7 g/dL (ref 30.0–36.0)
MCV: 90.6 fL (ref 80.0–100.0)
Monocytes Absolute: 0.4 10*3/uL (ref 0.1–1.0)
Monocytes Relative: 2 %
Neutro Abs: 19.3 10*3/uL — ABNORMAL HIGH (ref 1.7–7.7)
Neutrophils Relative %: 88 %
Platelets: 271 10*3/uL (ref 150–400)
RBC: 2.87 MIL/uL — ABNORMAL LOW (ref 3.87–5.11)
RDW: 16 % — ABNORMAL HIGH (ref 11.5–15.5)
WBC: 21.9 10*3/uL — ABNORMAL HIGH (ref 4.0–10.5)
nRBC: 0 % (ref 0.0–0.2)

## 2022-10-06 LAB — COMPREHENSIVE METABOLIC PANEL
ALT: 19 U/L (ref 0–44)
AST: 75 U/L — ABNORMAL HIGH (ref 15–41)
Albumin: 2.7 g/dL — ABNORMAL LOW (ref 3.5–5.0)
Alkaline Phosphatase: 51 U/L (ref 38–126)
Anion gap: 12 (ref 5–15)
BUN: 64 mg/dL — ABNORMAL HIGH (ref 8–23)
CO2: 23 mmol/L (ref 22–32)
Calcium: 8.4 mg/dL — ABNORMAL LOW (ref 8.9–10.3)
Chloride: 96 mmol/L — ABNORMAL LOW (ref 98–111)
Creatinine, Ser: 3.13 mg/dL — ABNORMAL HIGH (ref 0.44–1.00)
GFR, Estimated: 14 mL/min — ABNORMAL LOW (ref >60)
Glucose, Bld: 112 mg/dL — ABNORMAL HIGH (ref 70–99)
Potassium: 4.3 mmol/L (ref 3.5–5.1)
Sodium: 131 mmol/L — ABNORMAL LOW (ref 135–145)
Total Bilirubin: 0.7 mg/dL (ref 0.3–1.2)
Total Protein: 5.9 g/dL — ABNORMAL LOW (ref 6.5–8.1)

## 2022-10-06 NOTE — ED Triage Notes (Addendum)
Pt had leg vein removed and transferred for fistula placement due to Stage 5 kidney disease. Surgery was yesterday. Dtr with her whole time except 10-2pm. She seemed lethargic and urine dark brown so dtr had her drink some water- about 48 oz. When dtr came back at dinner around 5:30pm, pt was not able to figure out how to turn tv off. She was not able to retrieve words then the confusion seemed to pass. Dtr left and went to dinner and got call saying needed help to turn off phone which seemed odd. She went to check on her and decided to bring her in for evaluation.  Not had any narcotics today. Did at hospital yesterday in morning. No neuro deficits RN called Dr Lockie Mola to discuss.

## 2022-10-06 NOTE — ED Notes (Signed)
Patient transported to CT 

## 2022-10-07 ENCOUNTER — Other Ambulatory Visit: Payer: Self-pay

## 2022-10-07 ENCOUNTER — Encounter (HOSPITAL_COMMUNITY): Payer: Self-pay | Admitting: Internal Medicine

## 2022-10-07 ENCOUNTER — Emergency Department (HOSPITAL_COMMUNITY): Payer: Medicare Other

## 2022-10-07 ENCOUNTER — Inpatient Hospital Stay (HOSPITAL_COMMUNITY): Payer: Medicare Other

## 2022-10-07 DIAGNOSIS — Z1152 Encounter for screening for COVID-19: Secondary | ICD-10-CM | POA: Diagnosis not present

## 2022-10-07 DIAGNOSIS — I5189 Other ill-defined heart diseases: Secondary | ICD-10-CM

## 2022-10-07 DIAGNOSIS — D649 Anemia, unspecified: Secondary | ICD-10-CM

## 2022-10-07 DIAGNOSIS — Z881 Allergy status to other antibiotic agents status: Secondary | ICD-10-CM | POA: Diagnosis not present

## 2022-10-07 DIAGNOSIS — R4182 Altered mental status, unspecified: Secondary | ICD-10-CM | POA: Diagnosis present

## 2022-10-07 DIAGNOSIS — Z48812 Encounter for surgical aftercare following surgery on the circulatory system: Secondary | ICD-10-CM | POA: Diagnosis not present

## 2022-10-07 DIAGNOSIS — R7401 Elevation of levels of liver transaminase levels: Secondary | ICD-10-CM | POA: Diagnosis present

## 2022-10-07 DIAGNOSIS — E039 Hypothyroidism, unspecified: Secondary | ICD-10-CM

## 2022-10-07 DIAGNOSIS — N189 Chronic kidney disease, unspecified: Secondary | ICD-10-CM

## 2022-10-07 DIAGNOSIS — E785 Hyperlipidemia, unspecified: Secondary | ICD-10-CM | POA: Diagnosis present

## 2022-10-07 DIAGNOSIS — J157 Pneumonia due to Mycoplasma pneumoniae: Secondary | ICD-10-CM | POA: Diagnosis present

## 2022-10-07 DIAGNOSIS — Z87891 Personal history of nicotine dependence: Secondary | ICD-10-CM | POA: Diagnosis not present

## 2022-10-07 DIAGNOSIS — I129 Hypertensive chronic kidney disease with stage 1 through stage 4 chronic kidney disease, or unspecified chronic kidney disease: Secondary | ICD-10-CM | POA: Diagnosis present

## 2022-10-07 DIAGNOSIS — M8660 Other chronic osteomyelitis, unspecified site: Secondary | ICD-10-CM | POA: Diagnosis not present

## 2022-10-07 DIAGNOSIS — R8271 Bacteriuria: Secondary | ICD-10-CM | POA: Diagnosis not present

## 2022-10-07 DIAGNOSIS — Z96653 Presence of artificial knee joint, bilateral: Secondary | ICD-10-CM | POA: Diagnosis present

## 2022-10-07 DIAGNOSIS — Z8249 Family history of ischemic heart disease and other diseases of the circulatory system: Secondary | ICD-10-CM | POA: Diagnosis not present

## 2022-10-07 DIAGNOSIS — Z79899 Other long term (current) drug therapy: Secondary | ICD-10-CM | POA: Diagnosis not present

## 2022-10-07 DIAGNOSIS — J90 Pleural effusion, not elsewhere classified: Secondary | ICD-10-CM | POA: Diagnosis present

## 2022-10-07 DIAGNOSIS — M8668 Other chronic osteomyelitis, other site: Secondary | ICD-10-CM | POA: Diagnosis present

## 2022-10-07 DIAGNOSIS — G9341 Metabolic encephalopathy: Secondary | ICD-10-CM | POA: Diagnosis present

## 2022-10-07 DIAGNOSIS — Z823 Family history of stroke: Secondary | ICD-10-CM | POA: Diagnosis not present

## 2022-10-07 DIAGNOSIS — N184 Chronic kidney disease, stage 4 (severe): Secondary | ICD-10-CM | POA: Diagnosis present

## 2022-10-07 DIAGNOSIS — D72829 Elevated white blood cell count, unspecified: Secondary | ICD-10-CM

## 2022-10-07 DIAGNOSIS — N179 Acute kidney failure, unspecified: Secondary | ICD-10-CM

## 2022-10-07 DIAGNOSIS — Z96642 Presence of left artificial hip joint: Secondary | ICD-10-CM | POA: Diagnosis present

## 2022-10-07 DIAGNOSIS — Z8673 Personal history of transient ischemic attack (TIA), and cerebral infarction without residual deficits: Secondary | ICD-10-CM | POA: Diagnosis not present

## 2022-10-07 DIAGNOSIS — D62 Acute posthemorrhagic anemia: Secondary | ICD-10-CM | POA: Diagnosis present

## 2022-10-07 DIAGNOSIS — Y838 Other surgical procedures as the cause of abnormal reaction of the patient, or of later complication, without mention of misadventure at the time of the procedure: Secondary | ICD-10-CM | POA: Diagnosis present

## 2022-10-07 DIAGNOSIS — M8580 Other specified disorders of bone density and structure, unspecified site: Secondary | ICD-10-CM | POA: Diagnosis present

## 2022-10-07 DIAGNOSIS — J45909 Unspecified asthma, uncomplicated: Secondary | ICD-10-CM | POA: Diagnosis present

## 2022-10-07 DIAGNOSIS — E162 Hypoglycemia, unspecified: Secondary | ICD-10-CM | POA: Diagnosis not present

## 2022-10-07 DIAGNOSIS — D631 Anemia in chronic kidney disease: Secondary | ICD-10-CM | POA: Diagnosis present

## 2022-10-07 LAB — BPAM RBC: Blood Product Expiration Date: 202405092359

## 2022-10-07 LAB — BRAIN NATRIURETIC PEPTIDE: B Natriuretic Peptide: 353.2 pg/mL — ABNORMAL HIGH (ref 0.0–100.0)

## 2022-10-07 LAB — CBC WITH DIFFERENTIAL/PLATELET
Abs Immature Granulocytes: 0.25 10*3/uL — ABNORMAL HIGH (ref 0.00–0.07)
Basophils Absolute: 0.1 10*3/uL (ref 0.0–0.1)
Basophils Relative: 0 %
Eosinophils Absolute: 0.1 10*3/uL (ref 0.0–0.5)
Eosinophils Relative: 1 %
HCT: 23.9 % — ABNORMAL LOW (ref 36.0–46.0)
Hemoglobin: 7.6 g/dL — ABNORMAL LOW (ref 12.0–15.0)
Immature Granulocytes: 1 %
Lymphocytes Relative: 6 %
Lymphs Abs: 1.1 10*3/uL (ref 0.7–4.0)
MCH: 29.6 pg (ref 26.0–34.0)
MCHC: 31.8 g/dL (ref 30.0–36.0)
MCV: 93 fL (ref 80.0–100.0)
Monocytes Absolute: 0.4 10*3/uL (ref 0.1–1.0)
Monocytes Relative: 3 %
Neutro Abs: 15.4 10*3/uL — ABNORMAL HIGH (ref 1.7–7.7)
Neutrophils Relative %: 89 %
Platelets: 236 10*3/uL (ref 150–400)
RBC: 2.57 MIL/uL — ABNORMAL LOW (ref 3.87–5.11)
RDW: 15.9 % — ABNORMAL HIGH (ref 11.5–15.5)
WBC: 17.3 10*3/uL — ABNORMAL HIGH (ref 4.0–10.5)
nRBC: 0 % (ref 0.0–0.2)

## 2022-10-07 LAB — CBG MONITORING, ED
Glucose-Capillary: 68 mg/dL — ABNORMAL LOW (ref 70–99)
Glucose-Capillary: 86 mg/dL (ref 70–99)

## 2022-10-07 LAB — RESPIRATORY PANEL BY PCR

## 2022-10-07 LAB — RENAL FUNCTION PANEL
Albumin: 2.4 g/dL — ABNORMAL LOW (ref 3.5–5.0)
Anion gap: 10 (ref 5–15)
BUN: 58 mg/dL — ABNORMAL HIGH (ref 8–23)
CO2: 22 mmol/L (ref 22–32)
Calcium: 8.2 mg/dL — ABNORMAL LOW (ref 8.9–10.3)
Chloride: 101 mmol/L (ref 98–111)
Creatinine, Ser: 2.89 mg/dL — ABNORMAL HIGH (ref 0.44–1.00)
GFR, Estimated: 16 mL/min — ABNORMAL LOW (ref >60)
Glucose, Bld: 80 mg/dL (ref 70–99)
Phosphorus: 3.5 mg/dL (ref 2.5–4.6)
Potassium: 4.5 mmol/L (ref 3.5–5.1)
Sodium: 133 mmol/L — ABNORMAL LOW (ref 135–145)

## 2022-10-07 LAB — PREPARE RBC (CROSSMATCH)

## 2022-10-07 LAB — URINALYSIS, ROUTINE W REFLEX MICROSCOPIC
Bilirubin Urine: NEGATIVE
Glucose, UA: NEGATIVE mg/dL
Ketones, ur: NEGATIVE mg/dL
Nitrite: NEGATIVE
Protein, ur: 30 mg/dL — AB
Specific Gravity, Urine: 1.005 (ref 1.005–1.030)
WBC, UA: >50 WBC/hpf (ref 0–5)
pH: 7 (ref 5.0–8.0)

## 2022-10-07 LAB — LACTIC ACID, PLASMA: Lactic Acid, Venous: 1.2 mmol/L (ref 0.5–1.9)

## 2022-10-07 LAB — T4, FREE: Free T4: 0.75 ng/dL (ref 0.61–1.12)

## 2022-10-07 LAB — TYPE AND SCREEN: Donor AG Type: NEGATIVE

## 2022-10-07 LAB — PROTIME-INR
INR: 1.1 (ref 0.8–1.2)
Prothrombin Time: 14.1 seconds (ref 11.4–15.2)

## 2022-10-07 LAB — STREP PNEUMONIAE URINARY ANTIGEN: Strep Pneumo Urinary Antigen: NEGATIVE

## 2022-10-07 LAB — PROCALCITONIN: Procalcitonin: 47.44 ng/mL

## 2022-10-07 LAB — HEMOGLOBIN AND HEMATOCRIT, BLOOD
HCT: 26.8 % — ABNORMAL LOW (ref 36.0–46.0)
Hemoglobin: 8.9 g/dL — ABNORMAL LOW (ref 12.0–15.0)

## 2022-10-07 LAB — SARS CORONAVIRUS 2 BY RT PCR: SARS Coronavirus 2 by RT PCR: NEGATIVE

## 2022-10-07 LAB — ACETAMINOPHEN LEVEL: Acetaminophen (Tylenol), Serum: <10 ug/mL — ABNORMAL LOW (ref 10–30)

## 2022-10-07 LAB — TSH: TSH: 11.648 u[IU]/mL — ABNORMAL HIGH (ref 0.350–4.500)

## 2022-10-07 MED ORDER — SODIUM CHLORIDE 0.9% IV SOLUTION: Freq: Once | INTRAVENOUS | Status: AC

## 2022-10-07 MED ORDER — LORATADINE 10 MG PO TABS
10.0000 mg | ORAL_TABLET | Freq: Every day | ORAL | Status: DC
  Administered 2022-10-07 – 2022-10-09 (×3): 10 mg via ORAL
  Filled 2022-10-07 (×3): qty 1

## 2022-10-07 MED ORDER — HYDROCODONE-ACETAMINOPHEN 5-325 MG PO TABS: 1.0000 | ORAL_TABLET | Freq: Four times a day (QID) | ORAL | Status: DC | PRN

## 2022-10-07 MED ORDER — MAGNESIUM SULFATE IN D5W 1-5 GM/100ML-% IV SOLN
1.0000 g | Freq: Once | INTRAVENOUS | Status: AC
  Administered 2022-10-07: 1 g via INTRAVENOUS
  Filled 2022-10-07: qty 100

## 2022-10-07 MED ORDER — DOXYCYCLINE HYCLATE 100 MG PO TABS
100.0000 mg | ORAL_TABLET | Freq: Two times a day (BID) | ORAL | Status: DC
  Administered 2022-10-07 – 2022-10-09 (×5): 100 mg via ORAL
  Filled 2022-10-07 (×5): qty 1

## 2022-10-07 MED ORDER — FUROSEMIDE 40 MG PO TABS
40.0000 mg | ORAL_TABLET | Freq: Every day | ORAL | Status: DC
  Administered 2022-10-07 – 2022-10-09 (×3): 40 mg via ORAL
  Filled 2022-10-07 (×3): qty 1

## 2022-10-07 MED ORDER — GUAIFENESIN ER 600 MG PO TB12
600.0000 mg | ORAL_TABLET | Freq: Two times a day (BID) | ORAL | Status: DC
  Administered 2022-10-07 – 2022-10-09 (×5): 600 mg via ORAL
  Filled 2022-10-07 (×5): qty 1

## 2022-10-07 MED ORDER — SODIUM BICARBONATE 650 MG PO TABS
1300.0000 mg | ORAL_TABLET | Freq: Two times a day (BID) | ORAL | Status: DC
  Administered 2022-10-07 – 2022-10-09 (×5): 1300 mg via ORAL
  Filled 2022-10-07 (×5): qty 2

## 2022-10-07 MED ORDER — METOPROLOL TARTRATE 25 MG PO TABS
25.0000 mg | ORAL_TABLET | Freq: Three times a day (TID) | ORAL | Status: DC
  Administered 2022-10-07 – 2022-10-08 (×3): 25 mg via ORAL
  Filled 2022-10-07 (×4): qty 1

## 2022-10-07 MED ORDER — MIRTAZAPINE 15 MG PO TABS
30.0000 mg | ORAL_TABLET | Freq: Every day | ORAL | Status: DC
  Administered 2022-10-07 – 2022-10-08 (×2): 30 mg via ORAL
  Filled 2022-10-07 (×2): qty 2

## 2022-10-07 MED ORDER — SODIUM CHLORIDE 0.9 % IV SOLN
1.0000 g | Freq: Once | INTRAVENOUS | Status: AC
  Administered 2022-10-07: 1 g via INTRAVENOUS
  Filled 2022-10-07: qty 10

## 2022-10-07 MED ORDER — SODIUM CHLORIDE 0.9 % IV BOLUS
1000.0000 mL | Freq: Once | INTRAVENOUS | Status: AC
  Administered 2022-10-07: 1000 mL via INTRAVENOUS

## 2022-10-07 MED ORDER — MAGNESIUM OXIDE -MG SUPPLEMENT 400 (240 MG) MG PO TABS
400.0000 mg | ORAL_TABLET | Freq: Every day | ORAL | Status: DC
  Administered 2022-10-07 – 2022-10-09 (×3): 400 mg via ORAL
  Filled 2022-10-07 (×3): qty 1

## 2022-10-07 MED ORDER — ALBUTEROL SULFATE (2.5 MG/3ML) 0.083% IN NEBU: 2.5000 mg | INHALATION_SOLUTION | RESPIRATORY_TRACT | Status: DC | PRN

## 2022-10-07 MED ORDER — ACETAMINOPHEN 650 MG RE SUPP: 650.0000 mg | Freq: Four times a day (QID) | RECTAL | Status: DC | PRN

## 2022-10-07 MED ORDER — SODIUM CHLORIDE 0.9% FLUSH
3.0000 mL | Freq: Two times a day (BID) | INTRAVENOUS | Status: DC
  Administered 2022-10-07 – 2022-10-08 (×4): 3 mL via INTRAVENOUS

## 2022-10-07 MED ORDER — ACETAMINOPHEN 325 MG PO TABS: 650.0000 mg | ORAL_TABLET | Freq: Four times a day (QID) | ORAL | Status: DC | PRN

## 2022-10-07 MED ORDER — CALCIUM GLUCONATE-NACL 1-0.675 GM/50ML-% IV SOLN
1.0000 g | Freq: Once | INTRAVENOUS | Status: AC
  Administered 2022-10-07: 1000 mg via INTRAVENOUS
  Filled 2022-10-07: qty 50

## 2022-10-07 MED ORDER — OYSTER SHELL CALCIUM/D3 500-5 MG-MCG PO TABS
1.0000 | ORAL_TABLET | Freq: Every day | ORAL | Status: DC
  Administered 2022-10-08 – 2022-10-09 (×2): 1 via ORAL
  Filled 2022-10-07 (×2): qty 1

## 2022-10-07 MED ORDER — SODIUM CHLORIDE 0.9 % IV SOLN
2.0000 g | INTRAVENOUS | Status: DC
  Administered 2022-10-08: 2 g via INTRAVENOUS
  Filled 2022-10-07: qty 20

## 2022-10-07 MED ORDER — ATORVASTATIN CALCIUM 10 MG PO TABS
10.0000 mg | ORAL_TABLET | Freq: Every morning | ORAL | Status: DC
  Administered 2022-10-07 – 2022-10-09 (×3): 10 mg via ORAL
  Filled 2022-10-07 (×3): qty 1

## 2022-10-07 MED ORDER — LEVOTHYROXINE SODIUM 88 MCG PO TABS
88.0000 ug | ORAL_TABLET | Freq: Every day | ORAL | Status: DC
  Administered 2022-10-08 – 2022-10-09 (×2): 88 ug via ORAL
  Filled 2022-10-07 (×2): qty 1

## 2022-10-07 NOTE — ED Notes (Signed)
ED TO INPATIENT HANDOFF REPORT  ED Nurse Name and Phone #: Rulon Eisenmenger Name/Age/Gender [name] 82 y.o. female Room/Bed: 039C/039C  Code Status   Code Status: Full Code  Home/SNF/Other Home Patient oriented to: self Is this baseline? No   Triage Complete: Triage complete  Chief Complaint CAP (community acquired pneumonia) due to Mycoplasma pneumoniae [J15.7]  Triage Note Pt had leg vein removed and transferred for fistula placement due to Stage 5 kidney disease. Surgery was yesterday. Dtr with her whole time except 10-2pm. She seemed lethargic and urine dark brown so dtr had her drink some water- about 48 oz. When dtr came back at dinner around 5:30pm, pt was not able to figure out how to turn tv off. She was not able to retrieve words then the confusion seemed to pass. Dtr left and went to dinner and got call saying needed help to turn off phone which seemed odd. She went to check on her and decided to bring her in for evaluation.  Not had any narcotics today. Did at hospital yesterday in morning. No neuro deficits RN called Dr Lockie Mola to discuss.    Allergies Allergies  Allergen Reactions   Egg-Derived Products Hives    eczema Poultry Rash and GI symptoms   Erythromycin Other (See Comments)    REACTION: stomach symptoms  and hives   Fish Allergy Other (See Comments) and Swelling    Lip swelling and eczema With fins hives    Gatifloxacin Hives, Nausea Only and Swelling   Other Anaphylaxis, Itching and Swelling    Legumes- all beans and peas and tree nuts cause lip swelling and eczema  Orange Vegetables cause eczema    Peanut-Containing Drug Products Anaphylaxis    And tree nuts    Poultry Meal Diarrhea and Itching   Banana Itching   Keflex [Cephalexin] Rash   Latex Itching    Level of Care/Admitting Diagnosis ED Disposition     ED Disposition  Admit   Condition  --   Comment  Hospital Area: [facility] [[id number]]  Level of  Care: Telemetry Medical [[id number]]  May admit patient to [facility] or [facility] if equivalent level of care is available:: No  Covid Evaluation: Asymptomatic - no recent exposure (last 10 days) testing not required  Diagnosis: CAP (community acquired pneumonia) due to Mycoplasma pneumoniae [[id number]]  Admitting Physician: Clydie Braun [[id number]]  Attending Physician: Clydie Braun [[id number]]  Certification:: I certify this patient will need inpatient services for at least 2 midnights          B Medical/Surgery History Past Medical History:  Diagnosis Date   Alopecia    Anxiety    Arthritis    Aspergillosis (HCC)    Asthma    Atopic dermatitis    Bladder incontinence    Chronic kidney disease    Complication of anesthesia, mood lability    1 time -cried x1   Depression, situational    Follicular cystitis    History of bilateral knee replacement    History of blood transfusion    after knee replacement   History of left hip replacement    History of transfusion    Hyperlipidemia    Hypertension    Hypothyroidism    Lactose intolerance    Multiple food allergies    Osteoarthritis    Osteopenia    Pneumonia    PVC (premature ventricular contraction)    Urticaria    Vitamin D  deficiency    Past Surgical History:  Procedure Laterality Date   ADENOIDECTOMY     APPENDECTOMY     AV FISTULA PLACEMENT Left 08/06/2022   Procedure: LEFT Brachial Basilic ARTERIOVENOUS (AV) FISTULA CREATION;  Surgeon: Cephus Shelling, MD;  Location: [facility] OR;  Service: Vascular;  Laterality: Left;   BASCILIC VEIN TRANSPOSITION Left 10/05/2022   Procedure: LEFT SECOND STAGE BASILIC VEIN TRANSPOSITION;  Surgeon: Cephus Shelling, MD;  Location: [facility] OR;  Service: Vascular;  Laterality: Left;   BLADDER REMOVAL     BREAST EXCISIONAL BIOPSY Right 1990   BROW LIFT  2020   CARPAL TUNNEL RELEASE     CHOLECYSTECTOMY     COLONOSCOPY W/ POLYPECTOMY     COLONOSCOPY WITH PROPOFOL N/A 03/24/2019    Procedure: COLONOSCOPY WITH PROPOFOL;  Surgeon: Vida Rigger, MD;  Location[facility] ENDOSCOPY;  Service: Endoscopy;  Laterality: N/A;   EYELID REPAIR W/ SKIN GRAFT     LEFT OOPHORECTOMY     POLYPECTOMY  03/24/2019   Procedure: POLYPECTOMY;  Surgeon: Vida Rigger, MD;  Location: [facility] ENDOSCOPY;  Service: Endoscopy;;   REPLACEMENT TOTAL KNEE BILATERAL     TONSILLECTOMY     TOTAL HIP ARTHROPLASTY Left 09/05/2015   Procedure: LEFT TOTAL HIP ARTHROPLASTY ANTERIOR APPROACH;  Surgeon: Ollen Gross, MD;  Location: [facility] ORS;  Service: Orthopedics;  Laterality: Left;   TUBAL LIGATION       A IV Location/Drains/Wounds Patient Lines/Drains/Airways Status     Active Line/Drains/Airways     Name Placement date Placement time Site Days   Peripheral IV 10/05/22 Anterior;Right Hand 10/05/22  --  Hand  2   Peripheral IV 10/07/22 20 G Anterior;Right Forearm 10/07/22  0219  Forearm  less than 1   Fistula / Graft Left Upper arm Arteriovenous fistula 08/06/22  1019  Upper arm  62            Intake/Output Last 24 hours No intake or output data in the 24 hours ending 10/07/22 1031  Labs/Imaging Results for orders placed or performed during the hospital encounter of 10/06/22 (from the past 48 hour(s))  CBC with Differential     Status: Abnormal   Collection Time: 10/06/22 10:09 PM  Result Value Ref Range   WBC 21.9 (H) 4.0 - 10.5 K/uL   RBC 2.87 (L) 3.87 - 5.11 MIL/uL   Hemoglobin 8.5 (L) 12.0 - 15.0 g/dL   HCT 40.9 (L) 81.1 - 91.4 %   MCV 90.6 80.0 - 100.0 fL   MCH 29.6 26.0 - 34.0 pg   MCHC 32.7 30.0 - 36.0 g/dL   RDW 78.2 (H) 95.6 - 21.3 %   Platelets 271 150 - 400 K/uL   nRBC 0.0 0.0 - 0.2 %   Neutrophils Relative % 88 %   Neutro Abs 19.3 (H) 1.7 - 7.7 K/uL   Lymphocytes Relative 8 %   Lymphs Abs 1.8 0.7 - 4.0 K/uL   Monocytes Relative 2 %   Monocytes Absolute 0.4 0.1 - 1.0 K/uL   Eosinophils Relative 1 %   Eosinophils Absolute 0.2 0.0 - 0.5 K/uL   Basophils Relative 0 %   Basophils  Absolute 0.1 0.0 - 0.1 K/uL   Immature Granulocytes 1 %   Abs Immature Granulocytes 0.25 (H) 0.00 - 0.07 K/uL    Comment: Performed at [facility], [address] [place], [place] [address]  Comprehensive metabolic panel     Status: Abnormal   Collection Time: 10/06/22 10:09 PM  Result  Value Ref Range   Sodium 131 (L) 135 - 145 mmol/L   Potassium 4.3 3.5 - 5.1 mmol/L   Chloride 96 (L) 98 - 111 mmol/L   CO2 23 22 - 32 mmol/L   Glucose, Bld 112 (H) 70 - 99 mg/dL    Comment: Glucose reference range applies only to samples taken after fasting for at least 8 hours.   BUN 64 (H) 8 - 23 mg/dL   Creatinine, Ser 1.61 (H) 0.44 - 1.00 mg/dL   Calcium 8.4 (L) 8.9 - 10.3 mg/dL   Total Protein 5.9 (L) 6.5 - 8.1 g/dL   Albumin 2.7 (L) 3.5 - 5.0 g/dL   AST 75 (H) 15 - 41 U/L   ALT 19 0 - 44 U/L   Alkaline Phosphatase 51 38 - 126 U/L   Total Bilirubin 0.7 0.3 - 1.2 mg/dL   GFR, Estimated 14 (L) >60 mL/min    Comment: (NOTE) Calculated using the CKD-EPI Creatinine Equation (2021)    Anion gap 12 5 - 15    Comment: Performed at [facility], [address], [place], [place] [address]  Lactic acid, plasma     Status: None   Collection Time: 10/07/22  1:38 AM  Result Value Ref Range   Lactic Acid, Venous 1.2 0.5 - 1.9 mmol/L    Comment: Performed at [facility], [address], [place], [place] [address]  Protime-INR     Status: None   Collection Time: 10/07/22  1:38 AM  Result Value Ref Range   Prothrombin Time 14.1 11.4 - 15.2 seconds   INR 1.1 0.8 - 1.2    Comment: (NOTE) INR goal varies based on device and disease states. Performed at [facility], [address], [place], [place] [address]   Urinalysis, Routine w reflex microscopic -Urine, Clean Catch     Status: Abnormal   Collection Time: 10/07/22  5:21 AM  Result Value Ref Range   Color, Urine YELLOW YELLOW   APPearance HAZY (A) CLEAR   Specific Gravity, Urine 1.005 1.005 - 1.030   pH 7.0 5.0 - 8.0    Glucose, UA NEGATIVE NEGATIVE mg/dL   Hgb urine dipstick MODERATE (A) NEGATIVE   Bilirubin Urine NEGATIVE NEGATIVE   Ketones, ur NEGATIVE NEGATIVE mg/dL   Protein, ur 30 (A) NEGATIVE mg/dL   Nitrite NEGATIVE NEGATIVE   Leukocytes,Ua LARGE (A) NEGATIVE   RBC / HPF 0-5 0 - 5 RBC/hpf   WBC, UA >50 0 - 5 WBC/hpf   Bacteria, UA RARE (A) NONE SEEN   Squamous Epithelial / HPF 0-5 0 - 5 /HPF    Comment: Performed at [facility], [address], [place], [place] [address]  CBG monitoring, ED     Status: Abnormal   Collection Time: 10/07/22  5:36 AM  Result Value Ref Range   Glucose-Capillary 68 (L) 70 - 99 mg/dL    Comment: Glucose reference range applies only to samples taken after fasting for at least 8 hours.  CBG monitoring, ED     Status: None   Collection Time: 10/07/22  6:47 AM  Result Value Ref Range   Glucose-Capillary 86 70 - 99 mg/dL    Comment: Glucose reference range applies only to samples taken after fasting for at least 8 hours.  CBC with Differential/Platelet     Status: Abnormal   Collection Time: 10/07/22  9:56 AM  Result Value Ref Range   WBC 17.3 (H) 4.0 - 10.5  K/uL   RBC 2.57 (L) 3.87 - 5.11 MIL/uL   Hemoglobin 7.6 (L) 12.0 - 15.0 g/dL   HCT 16.1 (L) 09.6 - 04.5 %   MCV 93.0 80.0 - 100.0 fL   MCH 29.6 26.0 - 34.0 pg   MCHC 31.8 30.0 - 36.0 g/dL   RDW 40.9 (H) 81.1 - 91.4 %   Platelets 236 150 - 400 K/uL   nRBC 0.0 0.0 - 0.2 %   Neutrophils Relative % 89 %   Neutro Abs 15.4 (H) 1.7 - 7.7 K/uL   Lymphocytes Relative 6 %   Lymphs Abs 1.1 0.7 - 4.0 K/uL   Monocytes Relative 3 %   Monocytes Absolute 0.4 0.1 - 1.0 K/uL   Eosinophils Relative 1 %   Eosinophils Absolute 0.1 0.0 - 0.5 K/uL   Basophils Relative 0 %   Basophils Absolute 0.1 0.0 - 0.1 K/uL   Immature Granulocytes 1 %   Abs Immature Granulocytes 0.25 (H) 0.00 - 0.07 K/uL    Comment: Performed at [facility], [address], [place], [place] [address]  Type and screen [facility]     Status: None (Preliminary result)   Collection Time: 10/07/22  9:58 AM  Result Value Ref Range   ABO/RH(D) PENDING    Antibody Screen PENDING    Sample Expiration      10/10/2022,2359 Performed at [facility], [address] [place], [place] [address]    MR BRAIN WO CONTRAST  Result Date: 10/07/2022 CLINICAL DATA:  TIA.  Altered mental status EXAM: MRI HEAD WITHOUT CONTRAST TECHNIQUE: Multiplanar, multiecho pulse sequences of the brain and surrounding structures were obtained without intravenous contrast. COMPARISON:  Head CT from yesterday FINDINGS: Brain: No acute infarction, hemorrhage, hydrocephalus, extra-axial collection or mass lesion. Mild chronic small vessel ischemic type change in the cerebral white matter. Age normal brain volume Vascular: Normal flow voids. Skull and upper cervical spine: Normal marrow signal. Sinuses/Orbits: Bilateral cataract resection.  No pathologic finding IMPRESSION: No acute finding.  Unremarkable MRI of the brain for age. Electronically Signed   By: Tiburcio Pea M.D.   On: 10/07/2022 04:46   CT Head Wo Contrast  Result Date: 10/06/2022 CLINICAL DATA:  Mental status change EXAM: CT HEAD WITHOUT CONTRAST TECHNIQUE: Contiguous axial images were obtained from the base of the skull through the vertex without intravenous contrast. RADIATION DOSE REDUCTION: This exam was performed according to the departmental dose-optimization program which includes automated exposure control, adjustment of the mA and/or kV according to patient size and/or use of iterative reconstruction technique. COMPARISON:  CT of the head 09/21/2022 FINDINGS: Brain: No evidence of acute infarction, hemorrhage, hydrocephalus, extra-axial collection or mass lesion/mass effect. Again seen is mild diffuse atrophy. There is stable mild periventricular white matter hypodensity, likely chronic small vessel ischemic change. There is an old lacunar infarct in the left basal ganglia  which is new from prior. Vascular: No hyperdense vessel or unexpected calcification. Skull: Normal. Negative for fracture or focal lesion. Sinuses/Orbits: No acute finding. Other: None. IMPRESSION: 1. No acute intracranial process. 2. Stable mild diffuse atrophy and chronic small vessel ischemic change. 3. Old lacunar infarct in the left basal ganglia, new from prior. Electronically Signed   By: Darliss Cheney M.D.   On: 10/06/2022 22:48   DG Chest 2 View  Result Date: 10/06/2022 CLINICAL DATA:  Altered mental status EXAM: CHEST - 2 VIEW COMPARISON:  01/15/2008 FINDINGS: Right lung is grossly clear. Small left-sided  pleural effusion with mild airspace disease at left base. Mild cardiomegaly. No pneumothorax. IMPRESSION: Small left pleural effusion with mild airspace disease at the left base which may be due to atelectasis or pneumonia. Electronically Signed   By: Jasmine Pang M.D.   On: 10/06/2022 22:29    Pending Labs Unresulted Labs (From admission, onward)     Start     Ordered   10/08/22 0500  CBC  Tomorrow morning,   R        10/07/22 0926   10/07/22 1004  Occult blood card to lab, stool  Once,   R        10/07/22 1003   10/07/22 0935  Respiratory (~20 pathogens) panel by PCR  (Respiratory panel by PCR (~20 pathogens, ~24 hr TAT)  w precautions)  Once,   R        10/07/22 0934   10/07/22 0935  SARS Coronavirus 2 by RT PCR (hospital order, performed in [facility] hospital lab) *cepheid single result test* Anterior Nasal Swab  (Tier 2 - SARS Coronavirus 2 by RT PCR (hospital order, performed in [facility] hospital lab) *cepheid single result test*)  Once,   R        10/07/22 0934   10/07/22 0927  Renal function panel  Daily,   R      10/07/22 0926   10/07/22 0925  TSH  Once,   R        10/07/22 0926   10/07/22 0925  Brain natriuretic peptide  Once,   R        10/07/22 0926   10/07/22 0924  Procalcitonin  Once,   R       References:    Procalcitonin Lower Respiratory Tract Infection AND  Sepsis Procalcitonin Algorithm   10/07/22 0926   10/07/22 4098  Legionella Pneumophila Serogp 1 Ur Ag  (COPD / Pneumonia / Cellulitis / Lower Extremity Wound)  Once,   R        10/07/22 0926   10/07/22 0922  Strep pneumoniae urinary antigen  (COPD / Pneumonia / Cellulitis / Lower Extremity Wound)  Once,   R        10/07/22 0926   10/07/22 0922  MRSA Next Gen by PCR, Nasal  Once,   R        10/07/22 0926   10/06/22 2355  Lactic acid, plasma  Now then every 2 hours,   R (with STAT occurrences)      10/06/22 2354   10/06/22 2355  Blood culture (routine x 2)  BLOOD CULTURE X 2,   R (with STAT occurrences)      10/06/22 2354            Vitals/Pain Today's Vitals   10/07/22 0330 10/07/22 0530 10/07/22 0619 10/07/22 0711  BP: 120/62 123/61  123/61  Pulse: 69 72  72  Resp: 20   20  Temp:   97.9 F (36.6 C) 97.9 F (36.6 C)  TempSrc:   Oral Oral  SpO2: 99% 99%  100%  PainSc:        Isolation Precautions Airborne and Contact precautions  Medications Medications  cefTRIAXone (ROCEPHIN) 1 g in sodium chloride 0.9 % 100 mL IVPB (has no administration in time range)  doxycycline (VIBRA-TABS) tablet 100 mg (has no administration in time range)  levothyroxine (SYNTHROID) tablet 88 mcg (has no administration in time range)  sodium bicarbonate tablet 1,300 mg (has no administration in time range)  mirtazapine (  REMERON) tablet 30 mg (has no administration in time range)  sodium chloride flush (NS) 0.9 % injection 3 mL (has no administration in time range)  cefTRIAXone (ROCEPHIN) 2 g in sodium chloride 0.9 % 100 mL IVPB (has no administration in time range)  acetaminophen (TYLENOL) tablet 650 mg (has no administration in time range)    Or  acetaminophen (TYLENOL) suppository 650 mg (has no administration in time range)  albuterol (PROVENTIL) (2.5 MG/3ML) 0.083% nebulizer solution 2.5 mg (has no administration in time range)  guaiFENesin (MUCINEX) 12 hr tablet 600 mg (has no  administration in time range)  sodium chloride 0.9 % bolus 1,000 mL (1,000 mLs Intravenous New Bag/Given 10/07/22 0220)  cefTRIAXone (ROCEPHIN) 1 g in sodium chloride 0.9 % 100 mL IVPB (0 g Intravenous Stopped 10/07/22 0250)    Mobility Have not seen the patient walk     Focused Assessments Neuro, vascular surgery for fistula placement   R Recommendations: See Admitting Provider Note  Report given to:   Additional Notes: bruising to left side

## 2022-10-07 NOTE — ED Notes (Signed)
Rn attempted PIV, unsuccessful. Second RN will now attempt USIV.

## 2022-10-07 NOTE — H&P (Addendum)
History and Physical    Patient: Linda Rice ZOX:[id number] DOB: 1941/04/24 DOA: 10/06/2022 DOS: the patient was seen and examined on 10/07/2022 PCP: Zoila Shutter, MD  Patient coming from: Home lives at [facility] in independent living via EMS  Chief Complaint:  Chief Complaint  Patient presents with   Altered Mental Status   HPI: Linda Rice is a 82 y.o. 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  She had left second stage basilic vein transposition surgery performed by Dr. Chestine Spore on 4/26.  She still makes urine and is not on hemodialysis at this time.  After the procedure she stated that she had had significant pain and soreness in the left arm.  She reported that she had been taking Tylenol 2 tablets possibly 500 mg every 4 hours due to her symptoms.  She had notified vascular surgery and that sent in a stronger medication but she had not picked it up yet.  Since the procedure she reports that she has been very lethargic.  Denied having any recent nausea, vomiting, diarrhea, sweats, or blood in her stools or urine.  She is not on any blood thinners and does not drink alcohol.  Patient's daughter makes note that at baseline the patient is independent and able to complete all of her ADLs.  However, since the surgery had been having difficulty getting around and normally ambulated without assistance.  She also seemed to be having a difficult time and completing tasks which was unusual.  The patient had recently gone to [place] and required hospitalization while there due to metabolic derangements where her calcium and magnesium levels were significantly low that was thought secondary to her  ileal conduit.  In the emergency department patient was noted to be afebrile with mild tachypnea and all other vital signs maintained.  Labs 4/27 significant for WBC 21.9, hemoglobin 8.5, sodium 131, 64, creatinine 3.13, calcium 8.4, albumin 2.7, AST 75, and lactic acid 1.2.  Chest x-ray noted a small left-sided pleural effusion with mild airspace disease in left lung base concerning for atelectasis or pneumonia.  CT scan of the head showed no acute abnormality and infarct in the left basal ganglia new from prior study.  Blood cultures have been obtained.  Patient had been given 1 L of normal saline IV fluids and Rocephin 1 g IV.  Review of Systems: As mentioned in the history of present illness. All other systems reviewed and are negative. Past Medical History:  Diagnosis Date   Alopecia    Anxiety    Arthritis    Aspergillosis (HCC)    Asthma    Atopic dermatitis    Bladder incontinence    Chronic kidney disease    Complication of anesthesia, mood lability    1 time -cried x1   Depression, situational    Follicular cystitis    History of bilateral knee replacement    History of blood transfusion    after knee replacement   History of left hip replacement    History of transfusion    Hyperlipidemia    Hypertension    Hypothyroidism    Lactose intolerance    Multiple food allergies    Osteoarthritis    Osteopenia    Pneumonia    PVC (  premature ventricular contraction)    Urticaria    Vitamin D deficiency    Past Surgical History:  Procedure Laterality Date   ADENOIDECTOMY     APPENDECTOMY     AV FISTULA PLACEMENT Left 08/06/2022   Procedure: LEFT Brachial Basilic ARTERIOVENOUS (AV) FISTULA CREATION;  Surgeon: Cephus Shelling, MD;  Location: [facility] OR;  Service: Vascular;  Laterality: Left;   BASCILIC VEIN TRANSPOSITION Left 10/05/2022   Procedure: LEFT SECOND STAGE BASILIC VEIN TRANSPOSITION;  Surgeon: Cephus Shelling, MD;  Location: [facility] OR;  Service: Vascular;   Laterality: Left;   BLADDER REMOVAL     BREAST EXCISIONAL BIOPSY Right 1990   BROW LIFT  2020   CARPAL TUNNEL RELEASE     CHOLECYSTECTOMY     COLONOSCOPY W/ POLYPECTOMY     COLONOSCOPY WITH PROPOFOL N/A 03/24/2019   Procedure: COLONOSCOPY WITH PROPOFOL;  Surgeon: Vida Rigger, MD;  Location: [facility] ENDOSCOPY;  Service: Endoscopy;  Laterality: N/A;   EYELID REPAIR W/ SKIN GRAFT     LEFT OOPHORECTOMY     POLYPECTOMY  03/24/2019   Procedure: POLYPECTOMY;  Surgeon: Vida Rigger, MD;  Location: [facility] ENDOSCOPY;  Service: Endoscopy;;   REPLACEMENT TOTAL KNEE BILATERAL     TONSILLECTOMY     TOTAL HIP ARTHROPLASTY Left 09/05/2015   Procedure: LEFT TOTAL HIP ARTHROPLASTY ANTERIOR APPROACH;  Surgeon: Ollen Gross, MD;  Location: [facility] ORS;  Service: Orthopedics;  Laterality: Left;   TUBAL LIGATION     Social History:  reports that she quit smoking about 43 years ago. Her smoking use included cigarettes. She has a 2.00 pack-year smoking history. She has never used smokeless tobacco. She reports that she does not currently use alcohol after a past usage of about 2.0 standard drinks of alcohol per week. She reports that she does not use drugs.  Allergies  Allergen Reactions   Egg-Derived Products Hives    eczema Poultry Rash and GI symptoms   Erythromycin Other (See Comments)    REACTION: stomach symptoms  and hives   Fish Allergy Other (See Comments) and Swelling    Lip swelling and eczema With fins hives    Gatifloxacin Hives, Nausea Only and Swelling   Other Anaphylaxis, Itching and Swelling    Legumes- all beans and peas and tree nuts cause lip swelling and eczema  Orange Vegetables cause eczema    Peanut-Containing Drug Products Anaphylaxis    And tree nuts    Poultry Meal Diarrhea and Itching   Banana Itching   Keflex [Cephalexin] Rash   Latex Itching    Family History  Problem Relation Age of Onset   Diabetes Mother    Heart disease Mother    Hyperlipidemia Mother    Hypertension  Mother    Stroke Mother    Anxiety disorder Mother    Obesity Mother    Hyperlipidemia Father    Heart disease Father    Allergic rhinitis Father    Hyperlipidemia Sister    Breast cancer Maternal Aunt    Asthma Maternal Grandfather    Breast cancer Cousin     Prior to Admission medications   Medication Sig Start Date End Date Taking? Authorizing Provider  albuterol (VENTOLIN HFA) 108 (90 Base) MCG/ACT inhaler Inhale 1-2 puffs into the lungs every 6 (six) hours as needed for wheezing or shortness of breath. 11/17/19  Yes Ambs, Norvel Richards, FNP  atorvastatin (LIPITOR) 10 MG tablet Take 10 mg by mouth in the morning.   Yes [provider]  Calcium Carb-Cholecalciferol (CALCIUM 500 + D PO) Take 1 tablet by mouth daily.   Yes [provider]  cetirizine (ZYRTEC) 10 MG tablet Take 10 mg by mouth at bedtime.   Yes [provider]  Darbepoetin Alfa (ARANESP) 40 MCG/0.4ML SOSY injection Inject 40 mcg into the skin every 28 (twenty-eight) days.   Yes [provider]  doxycycline (VIBRAMYCIN) 100 MG capsule Take 100 mg by mouth 2 (two) times daily. 06/17/15  Yes [provider]  EPINEPHrine 0.3 mg/0.3 mL IJ SOAJ injection Inject 0.3 mLs (0.3 mg total) into the muscle as needed for anaphylaxis. 11/17/19  Yes Ambs, Norvel Richards, FNP  furosemide (LASIX) 40 MG tablet Take 40 mg by mouth daily. 10/01/22  Yes [provider]  Levothyroxine Sodium 88 MCG CAPS Take 88 mcg by mouth daily before breakfast.   Yes [provider]  Magnesium 500 MG TABS Take 500 mg by mouth daily.   Yes [provider]  metoprolol tartrate (LOPRESSOR) 25 MG tablet Take 1 tablet (25 mg total) by mouth in the morning, at noon, and at bedtime. 09/21/22 09/16/23 Yes Patwardhan, Manish J, MD  mirtazapine (REMERON) 30 MG tablet Take 30 mg by mouth at bedtime.   Yes [provider]  Multiple Vitamins-Minerals (MULTIVITAMIN WITH MINERALS) tablet Take 1 tablet by mouth daily.  Woman   Yes [provider]  sodium bicarbonate 650 MG tablet Take 1,300 mg by mouth 2 (two) times daily. 03/28/21  Yes [provider]  HYDROcodone-acetaminophen (NORCO) 5-325 MG tablet Take 1 tablet by mouth every 6 (six) hours as needed for moderate pain. Patient not taking: Reported on 10/07/2022 10/05/22   Graceann Congress, PA-C    Physical Exam: Vitals:   10/07/22 0330 10/07/22 0530 10/07/22 0619 10/07/22 0711  BP: 120/62 123/61  123/61  Pulse: 69 72  72  Resp: 20   20  Temp:   97.9 F (36.6 C) 97.9 F (36.6 C)  TempSrc:   Oral Oral  SpO2: 99% 99%  100%   Constitutional: Ill-appearing female who appears to be in no acute distress at this time and able to follow commands Eyes: PERRL, lids and conjunctivae normal ENMT: Mucous membranes are moist.  .Normal dentition.  Neck: normal, supple, no masses, no thyromegaly Respiratory: clear to auscultation bilaterally, no wheezing, no crackles. Normal respiratory effort. No accessory muscle use.  Cardiovascular: Regular rate and rhythm, no murmurs / rubs / gallops.  Significant swelling of the left arm.  2+ pedal pulses. No carotid bruits.  Abdomen: no tenderness, no masses palpated. No hepatosplenomegaly. Bowel sounds positive.  Musculoskeletal: no clubbing / cyanosis.   Good ROM, no contractures.  Tenderness palpation of the left arm. Skin: Alopecia.  Significant bruising and swelling of the left arm as seen below Neurologic: CN 2-12 grossly intact.  Strength 5/5 in all 4.  Psychiatric: Normal judgment and insight. Alert and oriented x 3. Normal mood.   Data Reviewed:  EKG reveals sinus rhythm at 71 bpm with premature atrial complexes.  Reviewed labs, imaging, and pertinent records as noted above in HPI  Assessment and Plan:  Acute metabolic encephalopathy Acute.  Patient presents after being noted to be acutely altered, but appears alert and oriented x 3 at this time.  Patient reported that she had been taking 2  extra strength acetaminophen tablets every 4 hours as needed for pain.  CT scan noted stable mild diffuse atrophy with chronic small vessel ischemia, but made note of an old lacunar infarct  in the left basal ganglia that is new from prior CT done on 4/12.  However, MRI of the brain was negative for any acute abnormality. -Delirium precautions -Neurochecks -Check Resp virus panel -Check acetaminophen level and TSH  Postoperative anemia  Anemia of chronic disease S/p fistula  Acute.  Hemoglobin 8.5, but appeared 9.2-9.5 prior to the procedure.  Hemoglobin had been 11.4 last month.  Patient has not left second stage basilic vein transposition surgery performed by Dr. Chestine Spore on 4/26.  She has significant amount of bruising in the left arm as the likely thought for the drop in hemoglobin.  Repeat hemoglobin 7.6. -Type and screen for possible need of blood products -Continue to monitor H&H -Transfuse blood products for hemoglobins less than 8 g/dL. Order place to transfuse 1 unit of PRBC. -Vascular surgery consulted, will follow-up for any further recommendations  Leukocytosis Acute.  WBC 21.9-> 17.6.  Question possibility of pneumonia, UTI, or bacteremia given recent surgical procedure as a cause of symptoms. -Recheck CBC tomorrow morning  Possible community-acquired pneumonia Patient reports having no significant cough or shortness of breath. Chest x-ray gave concern for small left-sided pleural effusion with mild airspace disease in the left lung base concerning for atelectasis versus infection. -Aspiration precautions with elevation head of bed -Follow-up blood cultures -Check MRSA and respiratory virus panel screen -Check procalcitonin (40) -Check urine Legionella and urine strep -Continue empiric antibiotics of Rocephin and doxycycline which patient appears to be on chronically -Mucinex  S/p illeostomy  Bacteruria vs. possible UTI Present on admission. S/p cystectomy with ileal conduit  for inflammatory cystitis. Urinalysis noted large leukocytes, rare bacteria, and greater than 50 WBCs. -Continue Ostomy care -Check urine culture -Continue antibiotics as noted above  Acute kidney injury superimposed on chronic kidney disease stage IV Creatinine elevated initially at 3.17 with BUN 64, but baseline creatinine previously noted to be 2.5.  Suspect prerenal in nature secondary to acute blood loss. -Continue to kidney function -Avoid nephrotoxic agents -Continue sodium bicarb  Diastolic dysfunction Patient does not appear grossly fluid overloaded at this time.  BNP was noted to be mildly elevated at 353.2.  Chest x-ray noted concern for possible small left-sided pleural effusion.  Last echocardiogram noted EF to be 55 to 60% with grade 1 diastolic dysfunction back in 07/2022.  O2 saturations currently maintained on room air. -Strict I&O's and daily weights -Will resume Lasix 40 mg p.o. daily since blood transfusion has been ordered  Essential hypertension Blood pressures currently stable. -Resume home blood pressure regimen with holding parameters in place  Elevated AST Acute.  AST 75 on admission.  Patient did report recently using Tylenol quite frequently. -Continue to monitor LFTs  Osteomyelitis of the right knee Pateint chronic doxycycline witth and follows with infectious disease in outpatient setting. -Continue doxycycline  Hypothyroidism -Check ZOX(09.604) -Check free T4 -Continue levothyroxine but may need to adjust dose if warranted   DVT prophylaxis: SCDs Advance Care Planning:   Code Status: Full Code   Consults: Vascular surgery  Family Communication: Daughter updated over the phone  Severity of Illness: The appropriate patient status for this patient is INPATIENT. Inpatient status is judged to be reasonable and necessary in order to provide the required intensity of service to ensure the patient's safety. [name] presenting symptoms, physical  exam findings, and initial radiographic and laboratory data in the context of their chronic comorbidities is felt to place them at high risk for further clinical deterioration. Furthermore, it is not anticipated that the patient  will be medically stable for discharge from the hospital within 2 midnights of admission.   * I certify that at the point of admission it is my clinical judgment that the patient will require inpatient hospital care spanning beyond 2 midnights from the point of admission due to high intensity of service, high risk for further deterioration and high frequency of surveillance required.*  Author: Clydie Braun, MD 10/07/2022 9:14 AM  For on call review [email or web address].

## 2022-10-07 NOTE — ED Provider Notes (Signed)
[place] EMERGENCY DEPARTMENT AT [facility] Provider Note   CSN: [id number] Arrival date & time: 10/06/22  2125     History  Chief Complaint  Patient presents with   Altered Mental Status    Linda Rice is a 82 y.o. female.  HPI   Patient with medical history including CKD stage IV, hypertension, hyperlipidemia, GERD, status post left AC fistula graft presenting with complaints of altered mental status.  The patient states that she feels unwell since her surgery, states that she feels as if she has not recovered the way she would expected, she notes that she feels generalized weakness and feels off balance, states that she does not trust her self walking.  She says at baseline she can walk without difficulty, she states that she is having some pain in her left arm but she had no associated fevers chills cough congestion some pains nausea or vomiting there is been no recent falls.    Daughter is at bedside states that patient had transient episodes of confusion, started earlier yesterday, patient forgot how to use the remote to the TV and then forgot how to use her cell phone, daughter does state that patient was misusing her words but was not actually slurring them.  Both these incidents lasted a couple minutes and then resolved.  I have reviewed patient's chart seen by Dr. Chestine Spore 04/26, status post brachial vein transportation for [facility] fistula, has not undergone dialysis treatment this time.  Home Medications Prior to Admission medications   Medication Sig Start Date End Date Taking? Authorizing Provider  albuterol (VENTOLIN HFA) 108 (90 Base) MCG/ACT inhaler Inhale 1-2 puffs into the lungs every 6 (six) hours as needed for wheezing or shortness of breath. 11/17/19  Yes Ambs, Norvel Richards, FNP  atorvastatin (LIPITOR) 10 MG tablet Take 10 mg by mouth in the morning.   Yes [provider]  Calcium Carb-Cholecalciferol (CALCIUM 500 + D PO) Take 1 tablet by mouth daily.    Yes [provider]  cetirizine (ZYRTEC) 10 MG tablet Take 10 mg by mouth at bedtime.   Yes [provider]  Darbepoetin Alfa (ARANESP) 40 MCG/0.4ML SOSY injection Inject 40 mcg into the skin every 28 (twenty-eight) days.   Yes [provider]  doxycycline (VIBRAMYCIN) 100 MG capsule Take 100 mg by mouth 2 (two) times daily. 06/17/15  Yes [provider]  EPINEPHrine 0.3 mg/0.3 mL IJ SOAJ injection Inject 0.3 mLs (0.3 mg total) into the muscle as needed for anaphylaxis. 11/17/19  Yes Ambs, Norvel Richards, FNP  furosemide (LASIX) 40 MG tablet Take 40 mg by mouth daily. 10/01/22  Yes [provider]  Levothyroxine Sodium 88 MCG CAPS Take 88 mcg by mouth daily before breakfast.   Yes [provider]  Magnesium 500 MG TABS Take 500 mg by mouth daily.   Yes [provider]  metoprolol tartrate (LOPRESSOR) 25 MG tablet Take 1 tablet (25 mg total) by mouth in the morning, at noon, and at bedtime. 09/21/22 09/16/23 Yes Patwardhan, Manish J, MD  mirtazapine (REMERON) 30 MG tablet Take 30 mg by mouth at bedtime.   Yes [provider]  Multiple Vitamins-Minerals (MULTIVITAMIN WITH MINERALS) tablet Take 1 tablet by mouth daily. Woman   Yes [provider]  sodium bicarbonate 650 MG tablet Take 1,300 mg by mouth 2 (two) times daily. 03/28/21  Yes [provider]  HYDROcodone-acetaminophen (NORCO) 5-325 MG tablet Take 1 tablet by mouth every 6 (six) hours  as needed for moderate pain. Patient not taking: Reported on 10/07/2022 10/05/22   Baglia, Corrina, PA-C      Allergies    Egg-derived products, Erythromycin, Fish allergy, Gatifloxacin, Other, Peanut-containing drug products, Poultry meal, Banana, Keflex [cephalexin], and Latex    Review of Systems   Review of Systems  Constitutional:  Negative for chills and fever.  Respiratory:  Negative for shortness of breath.   Cardiovascular:  Negative for chest pain.  Gastrointestinal:   Negative for abdominal pain.  Neurological:  Positive for weakness. Negative for headaches.    Physical Exam Updated Vital Signs BP 123/61   Pulse 72   Temp 97.8 F (36.6 C) (Oral)   Resp 20   SpO2 99%  Physical Exam Vitals and nursing note reviewed.  Constitutional:      General: She is not in acute distress.    Appearance: She is ill-appearing.  HENT:     Head: Normocephalic and atraumatic.     Nose: No congestion.     Mouth/Throat:     Mouth: Mucous membranes are moist.     Pharynx: Oropharynx is clear.  Eyes:     Conjunctiva/sclera: Conjunctivae normal.     Comments: EOMs intact, right pupil was slightly misshapened in comparison to the left but are responsive to light.  Cardiovascular:     Rate and Rhythm: Normal rate and regular rhythm.     Pulses: Normal pulses.     Heart sounds: No murmur heard.    No friction rub. No gallop.  Pulmonary:     Effort: No respiratory distress.     Breath sounds: No wheezing, rhonchi or rales.  Abdominal:     Palpations: Abdomen is soft.     Tenderness: There is no abdominal tenderness. There is no right CVA tenderness or left CVA tenderness.  Musculoskeletal:     Right lower leg: No edema.     Left lower leg: No edema.     Comments: Left upper extremity has significant ecchymosis as well as surgical incisions, which are intact, slight clearish drainage from the surgical wound, no fluctuance or induration present, patient a good palpable thrill at the left [facility] fistula, she did 2+ radial pulses, 2-second capillary refill, both arms are warm to the touch, all compartments are soft, full range of motion fingers wrist and elbow shoulder bilaterally.  Please see picture for full detail  Skin:    General: Skin is warm and dry.  Neurological:     Mental Status: She is alert.     GCS: GCS eye subscore is 4. GCS verbal subscore is 5. GCS motor subscore is 6.     Cranial Nerves: Cranial nerves 2-12 are intact. No cranial nerve deficit.      Sensory: Sensation is intact.     Motor: No weakness.     Coordination: Romberg sign negative. Finger-Nose-Finger Test normal.     Gait: Gait abnormal.     Comments: No facial asymmetry, patient slightly slow to respond but does not miss use her word or slurred her words, there is no unilateral weakness present but gait slightly off as she is very off balance.  Psychiatric:        Mood and Affect: Mood normal.     ED Results / Procedures / Treatments   Labs (all labs ordered are listed, but only abnormal results are displayed) Labs Reviewed  CBC WITH DIFFERENTIAL/PLATELET - Abnormal; Notable for the following components:      Result Value  WBC 21.9 (*)    RBC 2.87 (*)    Hemoglobin 8.5 (*)    HCT 26.0 (*)    RDW 16.0 (*)    Neutro Abs 19.3 (*)    Abs Immature Granulocytes 0.25 (*)    All other components within normal limits  COMPREHENSIVE METABOLIC PANEL - Abnormal; Notable for the following components:   Sodium 131 (*)    Chloride 96 (*)    Glucose, Bld 112 (*)    BUN 64 (*)    Creatinine, Ser 3.13 (*)    Calcium 8.4 (*)    Total Protein 5.9 (*)    Albumin 2.7 (*)    AST 75 (*)    GFR, Estimated 14 (*)    All other components within normal limits  CBG MONITORING, ED - Abnormal; Notable for the following components:   Glucose-Capillary 68 (*)    All other components within normal limits  CULTURE, BLOOD (ROUTINE X 2)  CULTURE, BLOOD (ROUTINE X 2)  LACTIC ACID, PLASMA  PROTIME-INR  URINALYSIS, ROUTINE W REFLEX MICROSCOPIC  LACTIC ACID, PLASMA    EKG EKG Interpretation  Date/Time:  Saturday October 06 2022 23:40:25 EDT Ventricular Rate:  71 PR Interval:  162 QRS Duration: 91 QT Interval:  371 QTC Calculation: 404 R Axis:   68 Text Interpretation: Sinus rhythm Atrial premature complexes Low voltage, extremity and precordial leads Consider anterior infarct Baseline wander in lead(s) V5 Confirmed by Kennis Carina [phone]) on 10/07/2022 12:35:48 AM  Radiology MR  BRAIN WO CONTRAST  Result Date: 10/07/2022 CLINICAL DATA:  TIA.  Altered mental status EXAM: MRI HEAD WITHOUT CONTRAST TECHNIQUE: Multiplanar, multiecho pulse sequences of the brain and surrounding structures were obtained without intravenous contrast. COMPARISON:  Head CT from yesterday FINDINGS: Brain: No acute infarction, hemorrhage, hydrocephalus, extra-axial collection or mass lesion. Mild chronic small vessel ischemic type change in the cerebral white matter. Age normal brain volume Vascular: Normal flow voids. Skull and upper cervical spine: Normal marrow signal. Sinuses/Orbits: Bilateral cataract resection.  No pathologic finding IMPRESSION: No acute finding.  Unremarkable MRI of the brain for age. Electronically Signed   By: Tiburcio Pea M.D.   On: 10/07/2022 04:46   CT Head Wo Contrast  Result Date: 10/06/2022 CLINICAL DATA:  Mental status change EXAM: CT HEAD WITHOUT CONTRAST TECHNIQUE: Contiguous axial images were obtained from the base of the skull through the vertex without intravenous contrast. RADIATION DOSE REDUCTION: This exam was performed according to the departmental dose-optimization program which includes automated exposure control, adjustment of the mA and/or kV according to patient size and/or use of iterative reconstruction technique. COMPARISON:  CT of the head 09/21/2022 FINDINGS: Brain: No evidence of acute infarction, hemorrhage, hydrocephalus, extra-axial collection or mass lesion/mass effect. Again seen is mild diffuse atrophy. There is stable mild periventricular white matter hypodensity, likely chronic small vessel ischemic change. There is an old lacunar infarct in the left basal ganglia which is new from prior. Vascular: No hyperdense vessel or unexpected calcification. Skull: Normal. Negative for fracture or focal lesion. Sinuses/Orbits: No acute finding. Other: None. IMPRESSION: 1. No acute intracranial process. 2. Stable mild diffuse atrophy and chronic small vessel  ischemic change. 3. Old lacunar infarct in the left basal ganglia, new from prior. Electronically Signed   By: Darliss Cheney M.D.   On: 10/06/2022 22:48   DG Chest 2 View  Result Date: 10/06/2022 CLINICAL DATA:  Altered mental status EXAM: CHEST - 2 VIEW COMPARISON:  01/15/2008 FINDINGS: Right lung is grossly  clear. Small left-sided pleural effusion with mild airspace disease at left base. Mild cardiomegaly. No pneumothorax. IMPRESSION: Small left pleural effusion with mild airspace disease at the left base which may be due to atelectasis or pneumonia. Electronically Signed   By: Jasmine Pang M.D.   On: 10/06/2022 22:29    Procedures Procedures    Medications Ordered in ED Medications  sodium chloride 0.9 % bolus 1,000 mL (1,000 mLs Intravenous New Bag/Given 10/07/22 0220)  cefTRIAXone (ROCEPHIN) 1 g in sodium chloride 0.9 % 100 mL IVPB (0 g Intravenous Stopped 10/07/22 0250)    ED Course/ Medical Decision Making/ A&P                             Medical Decision Making Amount and/or Complexity of Data Reviewed Labs: ordered. Radiology: ordered.  Risk Decision regarding hospitalization.   This patient presents to the ED for concern of altered mental status, this involves an extensive number of treatment options, and is a complaint that carries with it a high risk of complications and morbidity.  The differential diagnosis includes cva, intracranial bleed, sepsis, electrolyte derailment    Additional history obtained:  Additional history obtained from daughter at bedside External records from outside source obtained and reviewed including vascular notes   Co morbidities that complicate the patient evaluation  CKD, status post left arm fistula  Social Determinants of Health:  N/A    Lab Tests:  I Ordered, and personally interpreted labs.  The pertinent results include: CBC shows leukocytosis of 21.9, normocytic anemia hemoglobin 8.5, CMP reveals sodium 131, chloride 96,  glucose 112, BUN 64, creatinine 3.13 slightly above baseline, lactic 1.2, prothrombin time INR unremarkable   Imaging Studies ordered:  I ordered imaging studies including CT head, MRI, chest x-ray I independently visualized and interpreted imaging which showed chest x-ray reveals left lower lobe pneumonia, CT head reveals old lunar infarct in the left basal ganglia new from prior, MRI brain negative for acute findings I agree with the radiologist interpretation   Cardiac Monitoring:  The patient was maintained on a cardiac monitor.  I personally viewed and interpreted the cardiac monitored which showed an underlying rhythm of: Sinus without signs of ischemia   Medicines ordered and prescription drug management:  I ordered medication including fluids, ceftriaxone I have reviewed the patients home medicines and have made adjustments as needed  Critical Interventions:  N/A   Reevaluation:  Presented with altered mental status, triage obtain basic lab workup and imaging which I personally reviewed, concerning for leukocytosis with a white count 21,000, CT scan of head revealing left ganglial infarct, concern for patient had a subacute stroke, outside stroke window, will send down for MRI brain for further evaluation, will also add on lab workup for further workup of leukocytosis.  Chest x-ray reveals possible pneumonia, with an elevated white count and recent extubation from surgery will start antibiotics I suspect she likely has left lower lobe pneumonia  MRI is negative, suspect transient altered mental status and weakness is likely from pneumonia, will recommend admission patient and family is agreement this plan    Consultations Obtained:  I requested consultation with the Dr. Imogene Burn,  and discussed lab and imaging findings as well as pertinent plan - they recommend: He will admit the patient   Test Considered:  N/A   Rule out low suspicion for internal head bleed and or  mass as CT imaging is negative for acute findings.  Low suspicion for CVA she has no focal deficit present my exam, MRI is negative.  Patient did have transient altered mental status as well as weakness, but I suspect this is likely due to pneumonia as seen on her chest x-ray.  Suspicion for TIA is lower at this time his presentation atypical, no slurred speech no change in vision, no unilateral weakness.  Suspicion for postop infection of left arm is low at this time as there is no evidence infection on my exam i.e. there is no noted drainage, no erythema, no fluctuant induration present.  I doubt acute ischemia of the left arm is nontender to palpation, sensation tact light touch, she is 2+ radial pulses, 2-second capillary refill, she has no elevated lactic acid.  Low suspicion for dissection of the vertebral or carotid artery as presentation atypical of etiology.  Low suspicion for meningitis as she has no meningeal sign present.     Dispostion and problem list  After consideration of the diagnostic results and the patients response to treatment, I feel that the patent would benefit from admission.  Left lower lobe pneumonia-started on antibiotics, will need further monitoring.            Final Clinical Impression(s) / ED Diagnoses Final diagnoses:  Community acquired pneumonia of left lower lobe of lung    Rx / DC Orders ED Discharge Orders     None         Carroll Sage, PA-C 10/07/22 0545    Sabas Sous, MD 10/07/22 [phone]

## 2022-10-07 NOTE — Progress Notes (Signed)
VASCULAR LAB    Upper extremity duplex of dialysis access has been performed.  See CV proc for preliminary results.   Piero Mustard, RVT 10/07/2022, 3:51 PM

## 2022-10-07 NOTE — ED Notes (Signed)
Patient left the floor in stable condition with staff.  

## 2022-10-07 NOTE — Consult Note (Signed)
[facility] [place]  ASSESSMENT / PLAN: 82 y.o. female with left upper extremity ecchymosis and swelling after basilic vein transposition on 10/05/2022.  Clinically, I am not able to feel a discrete hematoma or pseudoaneurysm.  The fistula is patent and has a thrill.  She has a left radial pulse.  Will check a duplex ultrasound to ensure no pseudoaneurysm or other active bleeding issue.  Suspect this will resolve with time and compression.  CHIEF COMPLAINT: Altered mental status  HISTORY OF PRESENT ILLNESS: Farran Amsden is a 82 y.o. female admitted to the hospital 10/06/2022 for altered mental status.  The patient underwent a left basilic vein transposition on 10/05/2022.  The patient felt "out of it" after his surgery and slept most of the day Friday.  She also was very somnolent on Saturday.  She was brought in by her family Saturday night.  On evaluation, she was found to have significant swelling and bruising of the left arm.  This is painful to her.  She is mildly confused on my evaluation.  There is no family at bedside to augment the history.  Past Medical History:  Diagnosis Date   Alopecia    Anxiety    Arthritis    Aspergillosis (HCC)    Asthma    Atopic dermatitis    Bladder incontinence    Chronic kidney disease    Complication of anesthesia, mood lability    1 time -cried x1   Depression, situational    Follicular cystitis    History of bilateral knee replacement    History of blood transfusion    after knee replacement   History of left hip replacement    History of transfusion    Hyperlipidemia    Hypertension    Hypothyroidism    Lactose intolerance    Multiple food allergies    Osteoarthritis    Osteopenia    Pneumonia    PVC (premature ventricular contraction)    Urticaria    Vitamin D deficiency     Past Surgical History:  Procedure Laterality Date   ADENOIDECTOMY     APPENDECTOMY     AV FISTULA PLACEMENT Left 08/06/2022    Procedure: LEFT Brachial Basilic ARTERIOVENOUS (AV) FISTULA CREATION;  Surgeon: Cephus Shelling, MD;  Location: [facility] OR;  Service: Vascular;  Laterality: Left;   BASCILIC VEIN TRANSPOSITION Left 10/05/2022   Procedure: LEFT SECOND STAGE BASILIC VEIN TRANSPOSITION;  Surgeon: Cephus Shelling, MD;  Location: [facility] OR;  Service: Vascular;  Laterality: Left;   BLADDER REMOVAL     BREAST EXCISIONAL BIOPSY Right 1990   BROW LIFT  2020   CARPAL TUNNEL RELEASE     CHOLECYSTECTOMY     COLONOSCOPY W/ POLYPECTOMY     COLONOSCOPY WITH PROPOFOL N/A 03/24/2019   Procedure: COLONOSCOPY WITH PROPOFOL;  Surgeon: Vida Rigger, MD;  Location: [facility] ENDOSCOPY;  Service: Endoscopy;  Laterality: N/A;   EYELID REPAIR W/ SKIN GRAFT     LEFT OOPHORECTOMY     POLYPECTOMY  03/24/2019   Procedure: POLYPECTOMY;  Surgeon: Vida Rigger, MD;  Location: [facility] ENDOSCOPY;  Service: Endoscopy;;   REPLACEMENT TOTAL KNEE BILATERAL     TONSILLECTOMY     TOTAL HIP ARTHROPLASTY Left 09/05/2015   Procedure: LEFT TOTAL HIP ARTHROPLASTY ANTERIOR APPROACH;  Surgeon: Ollen Gross, MD;  Location: [facility] ORS;  Service: Orthopedics;  Laterality: Left;   TUBAL LIGATION      Family History  Problem Relation Age of Onset  Diabetes Mother    Heart disease Mother    Hyperlipidemia Mother    Hypertension Mother    Stroke Mother    Anxiety disorder Mother    Obesity Mother    Hyperlipidemia Father    Heart disease Father    Allergic rhinitis Father    Hyperlipidemia Sister    Breast cancer Maternal Aunt    Asthma Maternal Grandfather    Breast cancer Cousin     Social History   Socioeconomic History   Marital status: Divorced    Spouse name: Not on file   Number of children: 4   Years of education: Not on file   Highest education level: Not on file  Occupational History   Not on file  Tobacco Use   Smoking status: Former    Packs/day: 0.50    Years: 4.00    Additional pack years: 0.00    Total pack years: 2.00    Types:  Cigarettes    Quit date: 11    Years since quitting: 43.3   Smokeless tobacco: Never   Tobacco comments:    1 pkg a week  Vaping Use   Vaping Use: Never used  Substance and Sexual Activity   Alcohol use: Not Currently    Alcohol/week: 2.0 standard drinks of alcohol    Types: 1 Glasses of wine, 1 Standard drinks or equivalent per week   Drug use: No   Sexual activity: Not on file  Other Topics Concern   Not on file  Social History Narrative   Not on file   Social Determinants of Health   Financial Resource Strain: Not on file  Food Insecurity: Not on file  Transportation Needs: Not on file  Physical Activity: Not on file  Stress: Not on file  Social Connections: Not on file  Intimate Partner Violence: Not on file    Allergies  Allergen Reactions   Egg-Derived Products Hives    eczema Poultry Rash and GI symptoms   Erythromycin Other (See Comments)    REACTION: stomach symptoms  and hives   Fish Allergy Other (See Comments) and Swelling    Lip swelling and eczema With fins hives    Gatifloxacin Hives, Nausea Only and Swelling   Other Anaphylaxis, Itching and Swelling    Legumes- all beans and peas and tree nuts cause lip swelling and eczema  Orange Vegetables cause eczema    Peanut-Containing Drug Products Anaphylaxis    And tree nuts    Poultry Meal Diarrhea and Itching   Banana Itching   Keflex [Cephalexin] Rash   Latex Itching    Current Facility-Administered Medications  Medication Dose Route Frequency Provider Last Rate Last Admin   0.9 %  sodium chloride infusion (Manually program via Guardrails IV Fluids)   Intravenous Once Madelyn Flavors A, MD       albuterol (PROVENTIL) (2.5 MG/3ML) 0.083% nebulizer solution 2.5 mg  2.5 mg Nebulization Q4H PRN Smith, Rondell A, MD       atorvastatin (LIPITOR) tablet 10 mg  10 mg Oral q AM Madelyn Flavors A, MD       [START ON 10/08/2022] Calcium Carb-Cholecalciferol 500-3.125 MG-MCG TABS 1 tablet  1 tablet Oral Daily  Smith, Rondell A, MD       calcium gluconate 1 g/ 50 mL sodium chloride IVPB  1 g Intravenous Once Clydie Braun, MD       [START ON 10/08/2022] cefTRIAXone (ROCEPHIN) 2 g in sodium chloride 0.9 % 100 mL IVPB  2 g Intravenous Q24H Smith, Rondell A, MD       doxycycline (VIBRA-TABS) tablet 100 mg  100 mg Oral BID Madelyn Flavors A, MD   100 mg at 10/07/22 1047   furosemide (LASIX) tablet 40 mg  40 mg Oral Daily Smith, Rondell A, MD       guaiFENesin (MUCINEX) 12 hr tablet 600 mg  600 mg Oral BID Clydie Braun, MD       HYDROcodone-acetaminophen (NORCO/VICODIN) 5-325 MG per tablet 1 tablet  1 tablet Oral Q6H PRN Clydie Braun, MD       [START ON 10/08/2022] levothyroxine (SYNTHROID) tablet 88 mcg  88 mcg Oral QAC breakfast Katrinka Blazing, Rondell A, MD       loratadine (CLARITIN) tablet 10 mg  10 mg Oral Daily Smith, Rondell A, MD       magnesium oxide (MAG-OX) tablet 400 mg  400 mg Oral Daily Smith, Rondell A, MD       magnesium sulfate IVPB 1 g 100 mL  1 g Intravenous Once Katrinka Blazing, Rondell A, MD       mirtazapine (REMERON) tablet 30 mg  30 mg Oral QHS Smith, Rondell A, MD       sodium bicarbonate tablet 1,300 mg  1,300 mg Oral BID Smith, Rondell A, MD   1,300 mg at 10/07/22 1047   sodium chloride flush (NS) 0.9 % injection 3 mL  3 mL Intravenous Q12H Smith, Rondell A, MD   3 mL at 10/07/22 1051    PHYSICAL EXAM Vitals:   10/07/22 0619 10/07/22 0711 10/07/22 1121 10/07/22 1234  BP:  123/61 (!) 125/57 132/65  Pulse:  72 83 77  Resp:  20 18 18   Temp: 97.9 F (36.6 C) 97.9 F (36.6 C) 98 F (36.7 C) 98.2 F (36.8 C)  TempSrc: Oral Oral Oral Oral  SpO2:  100% 99% 100%   Elderly woman.  Appears chronically ill.  Alopecia. Regular rate and rhythm Unlabored breathing Left upper extremity ecchymotic about the upper arm antecubitum.  There is swelling throughout the arm.  I am not able to detect a discrete hematoma.  The fistula is palpable under the skin and has a thrill.  There is a palpable  radial pulse.  PERTINENT LABORATORY AND RADIOLOGIC DATA  Most recent CBC    Latest Ref Rng & Units 10/07/2022    9:56 AM 10/06/2022   10:09 PM 10/05/2022    6:34 AM  CBC  WBC 4.0 - 10.5 K/uL 17.3  21.9    Hemoglobin 12.0 - 15.0 g/dL 7.6  8.5  9.5   Hematocrit 36.0 - 46.0 % 23.9  26.0  28.0   Platelets 150 - 400 K/uL 236  271       Most recent CMP    Latest Ref Rng & Units 10/07/2022    9:56 AM 10/06/2022   10:09 PM 10/05/2022    6:34 AM  CMP  Glucose 70 - 99 mg/dL 80  161  92   BUN 8 - 23 mg/dL 58  64  67   Creatinine 0.44 - 1.00 mg/dL 0.96  0.45  4.09   Sodium 135 - 145 mmol/L 133  131  138   Potassium 3.5 - 5.1 mmol/L 4.5  4.3  5.4   Chloride 98 - 111 mmol/L 101  96  103   CO2 22 - 32 mmol/L 22  23    Calcium 8.9 - 10.3 mg/dL 8.2  8.4    Total Protein 6.5 -  8.1 g/dL  5.9    Total Bilirubin 0.3 - 1.2 mg/dL  0.7    Alkaline Phos 38 - 126 U/L  51    AST 15 - 41 U/L  75    ALT 0 - 44 U/L  19      Renal function Estimated Creatinine Clearance: 14.1 mL/min (A) (by C-G formula based on SCr of 2.89 mg/dL (H)).  Hgb A1c MFr Bld (%)  Date Value  04/27/2019 5.6    LDL Chol Calc (NIH)  Date Value Ref Range Status  11/30/2019 88 0 - 99 mg/dL Final    Rande Brunt. Lenell Antu, MD [facility] [facility] [place] Phone Number: [phone] 10/07/2022 12:38 PM   Total time spent on preparing this encounter including chart review, data review, collecting history, examining the patient, coordinating care for this established patient, 40 minutes.  Portions of this report may have been transcribed using voice recognition software.  Every effort has been made to ensure accuracy; however, inadvertent computerized transcription errors may still be present.

## 2022-10-08 DIAGNOSIS — G9341 Metabolic encephalopathy: Secondary | ICD-10-CM

## 2022-10-08 LAB — URINE CULTURE: Culture: 10000 — AB

## 2022-10-08 LAB — GLUCOSE, CAPILLARY: Glucose-Capillary: 76 mg/dL (ref 70–99)

## 2022-10-08 LAB — CULTURE, BLOOD (ROUTINE X 2)

## 2022-10-08 LAB — RENAL FUNCTION PANEL
Albumin: 2.3 g/dL — ABNORMAL LOW (ref 3.5–5.0)
Anion gap: 8 (ref 5–15)
BUN: 54 mg/dL — ABNORMAL HIGH (ref 8–23)
CO2: 25 mmol/L (ref 22–32)
Calcium: 8.3 mg/dL — ABNORMAL LOW (ref 8.9–10.3)
Chloride: 100 mmol/L (ref 98–111)
Creatinine, Ser: 2.84 mg/dL — ABNORMAL HIGH (ref 0.44–1.00)
GFR, Estimated: 16 mL/min — ABNORMAL LOW (ref >60)
Glucose, Bld: 83 mg/dL (ref 70–99)
Phosphorus: 4.1 mg/dL (ref 2.5–4.6)
Potassium: 4.4 mmol/L (ref 3.5–5.1)
Sodium: 133 mmol/L — ABNORMAL LOW (ref 135–145)

## 2022-10-08 LAB — TYPE AND SCREEN: Unit division: 0

## 2022-10-08 LAB — CBC
HCT: 26 % — ABNORMAL LOW (ref 36.0–46.0)
Hemoglobin: 8.4 g/dL — ABNORMAL LOW (ref 12.0–15.0)
MCH: 28 pg (ref 26.0–34.0)
MCHC: 32.3 g/dL (ref 30.0–36.0)
MCV: 86.7 fL (ref 80.0–100.0)
Platelets: 218 10*3/uL (ref 150–400)
RBC: 3 MIL/uL — ABNORMAL LOW (ref 3.87–5.11)
RDW: 17.9 % — ABNORMAL HIGH (ref 11.5–15.5)
WBC: 14.6 10*3/uL — ABNORMAL HIGH (ref 4.0–10.5)
nRBC: 0 % (ref 0.0–0.2)

## 2022-10-08 LAB — MAGNESIUM: Magnesium: 2.6 mg/dL — ABNORMAL HIGH (ref 1.7–2.4)

## 2022-10-08 LAB — BPAM RBC

## 2022-10-08 NOTE — Evaluation (Addendum)
Physical Therapy Evaluation Patient Details Name: Linda Rice MRN: [id number] DOB: Feb 06, 1941 Today's Date: 10/08/2022  History of Present Illness  Linda Rice is a 82 y.o. female presened to hospital after being noted to be acutely altered. Found to have scute metabolic encephalopathy, CAP, and UTI as well as LUE venous dopplar--high grade stenosis noted near the axilla possibly created by a large hematoma noted in same area. 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 Chestine Spore on 4/26  Clinical Impression  Pt admitted with above diagnosis. Pt able to ambulate with min guard assist with intermittent UE support.  Pt has cane at home and should do well with use of cane.  Recommend HHPT f/u as suspect pt with some weakness post op.  Will follow acutely and progress pt as able. Pt currently with functional limitations due to the deficits listed below (see PT Problem List). Pt will benefit from acute skilled PT to increase their independence and safety with mobility to allow discharge.          Recommendations for follow up therapy are one component of a multi-disciplinary discharge planning process, led by the attending physician.  Recommendations may be updated based on patient status, additional functional criteria and insurance authorization.  Follow Up Recommendations       Assistance Recommended at Discharge Set up Supervision/Assistance  Patient can return home with the following  A little help with walking and/or transfers    Equipment Recommendations None recommended by PT  Recommendations for Other Services       Functional Status Assessment Patient has had a recent decline in their functional status and  demonstrates the ability to make significant improvements in function in a reasonable and predictable amount of time.     Precautions / Restrictions Precautions: ileostomy Precautions: Fall Restrictions Weight Bearing Restrictions: No      Mobility  Bed Mobility Overal bed mobility: Needs Assistance Bed Mobility: Sit to Supine       Sit to supine: Supervision        Transfers Overall transfer level: Needs assistance   Transfers: Sit to/from Stand Sit to Stand: Min guard           General transfer comment: once standing holding onto IV pole for balance and does best with at lwast 1 UE support    Ambulation/Gait Ambulation/Gait assistance: Min guard Gait Distance (Feet): 150 Feet Assistive device: None, IV Pole Gait Pattern/deviations: Step-through pattern, Decreased stride length   Gait velocity interpretation: 1.31 - 2.62 ft/sec, indicative of limited community ambulator   General Gait Details: Pt was able to ambulate without device and intermittent 1 UE support for balance with pt stating she felt more supported with at least 1 UE support. Pt reports she has a cane at home she can use if needed.  Stairs            Wheelchair Mobility    Modified Rankin (Stroke Patients Only)       Balance Overall balance assessment: Needs assistance Sitting-balance support: No upper extremity supported, Feet supported Sitting balance-Leahy Scale: Good     Standing balance support: Single extremity supported Standing balance-Leahy Scale: Poor Standing balance comment: relies on at least 1 Ue support  Pertinent Vitals/Pain Pain Assessment Pain Assessment: No/denies pain    Home Living Family/patient expects to be discharged to:: Private residence Living Arrangements: Alone Available Help at Discharge: Family;Available PRN/intermittently Type of Home: Independent living facility ([facility] [place]) Home Access:  Level entry       Home Layout: One level Home Equipment: Hand held shower head;Grab bars - tub/shower;Grab bars - toilet;Shower seat - built in;Cane - single point Additional Comments: Has a golden doodle "Linda Rice", normally does not use any AD for ambulation, does all of her own IADLs    Prior Function Prior Level of Function : Independent/Modified Independent;Driving                     Hand Dominance   Dominant Hand: Right    Extremity/Trunk Assessment   Upper Extremity Assessment Upper Extremity Assessment: Defer to OT evaluation LUE Deficits / Details: still with alot of brusing and edema, re-wrapped ace wraps on it due to to ace wrap no covering some parts of arm and edema was pooling in these areas LUE Coordination: decreased gross motor    Lower Extremity Assessment Lower Extremity Assessment: Generalized weakness    Cervical / Trunk Assessment Cervical / Trunk Assessment: Kyphotic  Communication   Communication: No difficulties  Cognition Arousal/Alertness: Awake/alert Behavior During Therapy: WFL for tasks assessed/performed Overall Cognitive Status: Within Functional Limits for tasks assessed                                          General Comments General comments (skin integrity, edema, etc.): VSS    Exercises     Assessment/Plan    PT Assessment Patient needs continued PT services  PT Problem List Decreased activity tolerance;Decreased balance;Decreased mobility;Decreased knowledge of use of DME;Decreased safety awareness;Decreased knowledge of precautions       PT Treatment Interventions DME instruction;Gait training;Functional mobility training;Therapeutic activities;Therapeutic exercise;Balance training;Patient/family education    PT Goals (Current goals can be found in the Care Plan section)  Acute Rehab PT Goals Patient Stated Goal: to go back to [facility] PT Goal Formulation: With patient Time For Goal Achievement:  10/22/22 Potential to Achieve Goals: Good    Frequency Min 3X/week     Co-evaluation               AM-PAC PT "6 Clicks" Mobility  Outcome Measure Help needed turning from your back to your side while in a flat bed without using bedrails?: None Help needed moving from lying on your back to sitting on the side of a flat bed without using bedrails?: None Help needed moving to and from a bed to a chair (including a wheelchair)?: A Little Help needed standing up from a chair using your arms (e.g., wheelchair or bedside chair)?: A Little Help needed to walk in hospital room?: A Little Help needed climbing 3-5 steps with a railing? : A Lot 6 Click Score: 19    End of Session Equipment Utilized During Treatment: Gait belt Activity Tolerance: Patient tolerated treatment well Patient left: in bed;with call bell/phone within reach;with bed alarm set Nurse Communication: Mobility status PT Visit Diagnosis: Muscle weakness (generalized) (M62.81)    Time: 9147-8295 PT Time Calculation (min) (ACUTE ONLY): 13 min   Charges:   PT Evaluation $PT Eval Moderate Complexity: 1 Mod          Hubert Derstine M,PT Acute Rehab Services [phone]  Bevelyn Buckles 10/08/2022, 3:36 PM

## 2022-10-08 NOTE — Consult Note (Signed)
WOC Nurse ostomy consult note Stoma type/location: RUQ urostomy. Bedside RN consults for ostomy pouching equipment assistance. Discussed with Bedside RN M. SU. Stomal assessment/size: Not measured today Peristomal assessment: Not seen today Treatment options for stomal/peristomal skin: skin barrier ring, convex pouch Output: yellow urine (per bedside RN) Ostomy pouching:   1-piece convex urostomy pouch [facility] [place] # [phone]) with  skin barrier ring (stretch ring and place around stoma prior to placing pouch), [facility] [place] # [phone],  urostomy adapter for attaching to bedside drainage bag [facility] [place] # [phone]),  ostomy belt [facility] [place] # [phone])   Please order 5 pouches, 5 rings, 2 belts and 2 adapters to bedside.   Education provided: None today Enrolled patient in [employer] DC program: No (Patient is established with a supplier)  WOC nursing team will not follow, but will remain available to this patient, the nursing and medical teams.  Please re-consult if needed.  Thank you for inviting [place] to participate in this patient's Plan of Care.  Ladona Mow, MSN, RN, CNS, GNP, Leda Min, [employer], [employer] phone:  [phone]

## 2022-10-08 NOTE — Progress Notes (Signed)
PROGRESS NOTE Linda Rice  ZOX:[id number] DOB: 26-Jul-1940 DOA: 10/06/2022 PCP: Zoila Shutter, MD  Brief Narrative/Hospital Course: 82 y.o.f w/ hypertension, hyperlipidemia, CKD stage IV, hypothyroidism, alopecia, inflammatory cystitis of the bladder s/p cystectomy and ileal conduit with urostomy, chronic osteomyelitis of the right knee on doxycycline, and history of aspergillous presented after being noted to be acutely altered, she had left second stage basilic vein transposition surgery performed by Dr. Chestine Spore on 4/26 After the procedure she stated that she had had significant pain and soreness in the left arm. She reported that she had been taking Tylenol 2 tablets possibly 500 mg every 4 hours due to her symptoms.She had notified vascular surgery and that sent in a stronger medication but she had not picked it up yet.  Since the procedure she reports that she has been very lethargic She is independent and able to complete all of her ADLs at baseline.since the surgery had been having difficulty getting around and normally ambulated without assistance. She also seemed to be having a difficult time and completing tasks which was unusual. The patient had recently gone to [place] and required hospitalization while there due to metabolic derangements where her calcium and magnesium levels were significantly low that was thought secondary to her ileal conduit. In ED, afebrile with mild tachypnea and all other vital signs maintained.  Labs 4/27 significant for WBC 21.9, hemoglobin 8.5, sodium 131, 64, creatinine 3.13, calcium 8.4, albumin 2.7, AST 75, and lactic acid 1.2.  Chest x-ray noted a small left-sided pleural effusion with mild airspace disease in left lung base concerning for atelectasis or pneumonia.  CT scan of the head showed no acute abnormality and infarct in the left basal ganglia new from prior study.  Blood cultures have been obtained.  Patient had been given 1 L of normal saline IV  fluids and Rocephin 1 g IV.     Subjective: Seen and examined this morning. Alert awake oriented resting comfortably. Overnight afebrile.Labs-hb at 8.4, wbc down to 14.6 from 17.4gm,Bun/creat stable 54/2.84   Assessment and Plan: Principal Problem:   Acute metabolic encephalopathy Active Problems:   Postoperative anemia   Leukocytosis   CAP (community acquired pneumonia) due to Mycoplasma pneumoniae   Bacteriuria   Acute kidney injury superimposed on chronic kidney disease (HCC)   Diastolic dysfunction   Elevated AST (SGOT)   Chronic osteomyelitis (HCC)   Hypothyroidism   Postoperative anemia  Anemia of chronic disease S/p left arm 2nd Stage BVT w/ post op Hematoma: Baseline hb~ 9.2-9.5 prior to the procedure.Hemoglobin had been 11.4 last month.  Received 1 unit PRBC transfusion for hemoglobin 7.6 g and appropriately increased. Seen by vascular surgery Dr. Chestine Spore for left hand fistula has an excellent thrill.  The duplex finding no need for any procedure patient is alert awake oriented feels much better overall, however left arm is wrapped with a Gentle ACE advised elevation and should improve with conservative management suggested. Duplex "High grade stenosis noted near the axilla possibly created by a large hematoma noted in same area"- I sent message to Dr Dot Been re: the same- he is aware and being observed since he has good blood flow. She does have Frail skin. Recent Labs  Lab 10/05/22 0634 10/06/22 2209 10/07/22 0956 10/07/22 1749 10/08/22 0301  HGB 9.5* 8.5* 7.6* 8.9* 8.4*  HCT 28.0* 26.0* 23.9* 26.8* 26.0*    Acute metabolic encephalopathy: Patient confused at home has been weak and deconditioned currently oriented x 3 PT OT consulted  for evaluation..CT -stable mild diffuse atrophy with chronic small vessel ischemia, but made note of an old lacunar infarct in the left basal ganglia that is new from prior CT done on 4/12 However, MRI of the brain was negative for any acute  abnormality.  Free T4 normal Tylenol level less than 10.  Community-acquired pneumonia Leukocytosis: Pro-Cal was elevated significantly, respiratory virus panel negative, urine culture multiple species, blood culture pending.  Chest x-ray showed a small left pleural effusion with mild airspace disease at the left base.  Continue empiric ceftriaxone, doxycycline.  Leukocytosis downtrending.  Monitor, follow-up urine antigen. Recent Labs  Lab 10/06/22 2209 10/07/22 0138 10/07/22 0956 10/08/22 0301  WBC 21.9*  --  17.3* 14.6*  LATICACIDVEN  --  1.2  --   --   PROCALCITON  --   --  47.44  --     S/P illeostomy w/ bacteriuria: Likely in the setting of ileostomy, urine culture with multiple species.  On empiric antibiotics as above   AKI on CKD IV: B/L creat ~ 2.5-2.7, now at 2.8, continue oral hydration, p.o. Lasix, sodium bicarb twice daily Avoid nephrotoxic medications including NSAIDs and iodinated intravenous contrast exposure unless the latter is absolutely indicated.Preferred narcotic agents for pain control ZOX:[id number], fentanyl, and methadone and avoid morphine.Avoid Baclofen and avoid oral sodium phosphate and magnesium citrate based laxatives / bowel preps. Continue strict Input and Output monitoring and serial renal functions.  Recent Labs    08/06/22 0801 10/05/22 0634 10/06/22 2209 10/07/22 0956 10/08/22 0301  BUN 57* 67* 64* 58* 54*  CREATININE 2.50* 2.70* 3.13* 2.89* 2.84*    Diastolic dysfunction chronic: Not grossly volume overloaded. BNP was noted to be mildly elevated at 353.2.CXR noted concern for possible small left-sided pleural effusion.Last echocardiogram noted EF to be 55 to 60% with grade 1 diastolic dysfunction back in 07/2022.   Monitor intake output and continue home Lasix  Episode of transient hypoglycemia at 69 at 0536 am 4/28 no recurrence since.  Monitor Recent Labs  Lab 10/07/22 0536 10/07/22 0647  GLUCAP 68* 86    Essential hypertension:  Well-controlled continue Toprol and Lasix Elevated JYN:WGNFA.  AST 75 on admission.  Patient did report recently using Tylenol quite frequently.Continue to monitor LFTs.   Osteomyelitis of the right knee on chronic doxycycline witth and follows with ID   Hypothyroidism: Slightly elevated TSH but normal free T4 continue home levothyroxine adjust dose outpatient with repeat check in 3 to 4 weeks  DVT prophylaxis: SCDs Start: 10/07/22 2130 Code Status:   Code Status: Full Code Family Communication: plan of care discussed with patient at bedside. Patient status is:  admitted as observation but remains hospitalized for ongoing  because of pneumonia treatment, anemia  Level of care: Telemetry Medical  Dispo: The patient is from: home             Anticipated disposition: Pending PT OT evaluation next 24  Objective: Vitals last 24 hrs: Vitals:   10/07/22 1947 10/07/22 2027 10/08/22 0320 10/08/22 0824  BP:  (!) 126/58 121/62 116/62  Pulse:  67 65 61  Resp:  16 15 18   Temp:  98.3 F (36.8 C) 98.1 F (36.7 C) 97.7 F (36.5 C)  TempSrc:  Oral Oral Oral  SpO2:  98% 94% 96%  Weight: 67 kg     Height: 5\' 3"  (1.6 m)      Weight change:   Physical Examination: General exam: alert awake, older than stated age HEENT:Oral mucosa moist, Ear/Nose WNL  grossly Respiratory system: bilaterally clear BS, no use of accessory muscle Cardiovascular system: S1 & S2 +, No JVD. Gastrointestinal system: Abdomen soft,NT,ND, BS+ Nervous System:Alert, awake, moving extremities. Extremities: LE edema neg,distal peripheral pulses palpable.  Skin: No rashes,no icterus. MSK: Normal muscle bulk,tone, power  Medications reviewed:  Scheduled Meds:  atorvastatin  10 mg Oral q AM   calcium-vitamin D  1 tablet Oral Daily   doxycycline  100 mg Oral BID   furosemide  40 mg Oral Daily   guaiFENesin  600 mg Oral BID   levothyroxine  88 mcg Oral QAC breakfast   loratadine  10 mg Oral Daily   magnesium oxide  400  mg Oral Daily   metoprolol tartrate  25 mg Oral TID   mirtazapine  30 mg Oral QHS   sodium bicarbonate  1,300 mg Oral BID   sodium chloride flush  3 mL Intravenous Q12H   Continuous Infusions:  cefTRIAXone (ROCEPHIN)  IV 2 g (10/08/22 1104)    Diet Order             Diet renal with fluid restriction Fluid restriction: 1200 mL Fluid; Room service appropriate? Yes; Fluid consistency: Thin  Diet effective now                  Intake/Output Summary (Last 24 hours) at 10/08/2022 1306 Last data filed at 10/08/2022 0518 Gross per 24 hour  Intake 902.75 ml  Output 3500 ml  Net -2597.25 ml   Net IO Since Admission: -3,147.25 mL [10/08/22 1306]  Wt Readings from Last 3 Encounters:  10/07/22 67 kg  10/05/22 65.3 kg  09/25/22 66.6 kg     Unresulted Labs (From admission, onward)     Start     Ordered   10/07/22 1004  Occult blood card to lab, stool  Once,   R        10/07/22 1003   10/07/22 0927  Renal function panel  Daily,   R      10/07/22 0926   10/07/22 0922  Legionella Pneumophila Serogp 1 Ur Ag  (COPD / Pneumonia / Cellulitis / Lower Extremity Wound)  Once,   R        10/07/22 0926   10/07/22 0922  MRSA Next Gen by PCR, Nasal  Once,   R        10/07/22 0926          Data Reviewed: I have personally reviewed following labs and imaging studies CBC: Recent Labs  Lab 10/05/22 0634 10/06/22 2209 10/07/22 0956 10/07/22 1749 10/08/22 0301  WBC  --  21.9* 17.3*  --  14.6*  NEUTROABS  --  19.3* 15.4*  --   --   HGB 9.5* 8.5* 7.6* 8.9* 8.4*  HCT 28.0* 26.0* 23.9* 26.8* 26.0*  MCV  --  90.6 93.0  --  86.7  PLT  --  271 236  --  218   Basic Metabolic Panel: Recent Labs  Lab 10/05/22 0634 10/06/22 2209 10/07/22 0956 10/08/22 0301  NA 138 131* 133* 133*  K 5.4* 4.3 4.5 4.4  CL 103 96* 101 100  CO2  --  23 22 25   GLUCOSE 92 112* 80 83  BUN 67* 64* 58* 54*  CREATININE 2.70* 3.13* 2.89* 2.84*  CALCIUM  --  8.4* 8.2* 8.3*  MG  --   --   --  2.6*  PHOS  --   --   3.5 4.1   GFR: Estimated Creatinine Clearance:  14.5 mL/min (A) (by C-G formula based on SCr of 2.84 mg/dL (H)). Liver Function Tests: Recent Labs  Lab 10/06/22 2209 10/07/22 0956 10/08/22 0301  AST 75*  --   --   ALT 19  --   --   ALKPHOS 51  --   --   BILITOT 0.7  --   --   PROT 5.9*  --   --   ALBUMIN 2.7* 2.4* 2.3*   Recent Labs  Lab 10/07/22 0138  INR 1.1   CBG: Recent Labs  Lab 10/07/22 0536 10/07/22 0647  GLUCAP 68* 86   Lipid Profile: No results for input(s): "CHOL", "HDL", "LDLCALC", "TRIG", "CHOLHDL", "LDLDIRECT" in the last 72 hours. Thyroid Function Tests: Recent Labs    10/07/22 0956  TSH 11.648*  FREET4 0.75   Sepsis Labs: Recent Labs  Lab 10/07/22 0138 10/07/22 0956  PROCALCITON  --  47.44  LATICACIDVEN 1.2  --     Recent Results (from the past 240 hour(s))  Blood culture (routine x 2)     Status: None (Preliminary result)   Collection Time: 10/07/22  1:38 AM   Specimen: BLOOD LEFT ARM  Result Value Ref Range Status   Specimen Description BLOOD LEFT ARM  Final   Special Requests   Final    BOTTLES DRAWN AEROBIC AND ANAEROBIC Blood Culture results may not be optimal due to an excessive volume of blood received in culture bottles   Culture   Final    NO GROWTH 1 DAY Performed at [facility], [address] [place], [place] [address]    Report Status PENDING  Incomplete  Blood culture (routine x 2)     Status: None (Preliminary result)   Collection Time: 10/07/22  1:38 AM   Specimen: BLOOD LEFT HAND  Result Value Ref Range Status   Specimen Description BLOOD LEFT HAND  Final   Special Requests   Final    BOTTLES DRAWN AEROBIC AND ANAEROBIC Blood Culture adequate volume   Culture   Final    NO GROWTH 1 DAY Performed at [facility], [address], [place], [place] [address]    Report Status PENDING  Incomplete  Urine Culture (for pregnant, neutropenic or urologic patients or patients with an indwelling urinary catheter)      Status: Abnormal   Collection Time: 10/07/22  5:38 AM   Specimen: Urine, Clean Catch  Result Value Ref Range Status   Specimen Description URINE, CLEAN CATCH  Final   Special Requests   Final    NONE Performed at [facility], [address] [place], [place] [address]    Culture (A)  Final    10,000 COLONIES/mL MULTIPLE SPECIES PRESENT, SUGGEST RECOLLECTION   Report Status 10/08/2022 FINAL  Final  Respiratory (~20 pathogens) panel by PCR     Status: None   Collection Time: 10/07/22 10:50 AM   Specimen: Nasopharyngeal Swab; Respiratory  Result Value Ref Range Status   Adenovirus NOT DETECTED NOT DETECTED Final   Coronavirus 229E NOT DETECTED NOT DETECTED Final    Comment: (NOTE) The Coronavirus on the Respiratory Panel, DOES NOT test for the novel  Coronavirus (2019 nCoV)    Coronavirus HKU1 NOT DETECTED NOT DETECTED Final   Coronavirus NL63 NOT DETECTED NOT DETECTED Final   Coronavirus OC43 NOT DETECTED NOT DETECTED Final   Metapneumovirus NOT DETECTED NOT DETECTED Final   Rhinovirus / Enterovirus NOT DETECTED NOT DETECTED Final   Influenza A NOT DETECTED  NOT DETECTED Final   Influenza B NOT DETECTED NOT DETECTED Final   Parainfluenza Virus 1 NOT DETECTED NOT DETECTED Final   Parainfluenza Virus 2 NOT DETECTED NOT DETECTED Final   Parainfluenza Virus 3 NOT DETECTED NOT DETECTED Final   Parainfluenza Virus 4 NOT DETECTED NOT DETECTED Final   Respiratory Syncytial Virus NOT DETECTED NOT DETECTED Final   Bordetella pertussis NOT DETECTED NOT DETECTED Final   Bordetella Parapertussis NOT DETECTED NOT DETECTED Final   Chlamydophila pneumoniae NOT DETECTED NOT DETECTED Final   Mycoplasma pneumoniae NOT DETECTED NOT DETECTED Final    Comment: Performed at [facility], [address], [place], [place] [address]  SARS Coronavirus 2 by RT PCR (hospital order, performed in [facility] hospital lab) *cepheid single result test*     Status: None   Collection Time: 10/07/22  10:50 AM  Result Value Ref Range Status   SARS Coronavirus 2 by RT PCR NEGATIVE NEGATIVE Final    Comment: Performed at [facility] Lab, [address], [place], [place] [address]    Antimicrobials: Anti-infectives (From admission, onward)    Start     Dose/Rate Route Frequency Ordered Stop   10/08/22 1000  cefTRIAXone (ROCEPHIN) 2 g in sodium chloride 0.9 % 100 mL IVPB        2 g 200 mL/hr over 30 Minutes Intravenous Every 24 hours 10/07/22 0926 10/12/22 0959   10/07/22 1000  doxycycline (VIBRA-TABS) tablet 100 mg        100 mg Oral 2 times daily 10/07/22 0926     10/07/22 0930  cefTRIAXone (ROCEPHIN) 1 g in sodium chloride 0.9 % 100 mL IVPB        1 g 200 mL/hr over 30 Minutes Intravenous  Once 10/07/22 0926 10/07/22 1130   10/07/22 0115  cefTRIAXone (ROCEPHIN) 1 g in sodium chloride 0.9 % 100 mL IVPB        1 g 200 mL/hr over 30 Minutes Intravenous  Once 10/07/22 0110 10/07/22 0250      Culture/Microbiology    Component Value Date/Time   SDES URINE, CLEAN CATCH 10/07/2022 0538   SPECREQUEST  10/07/2022 0538    NONE Performed at [facility], [address] [place], [place] [address]    CULT (A) 10/07/2022 0538    10,000 COLONIES/mL MULTIPLE SPECIES PRESENT, SUGGEST RECOLLECTION   REPTSTATUS 10/08/2022 FINAL 10/07/2022 0538    Radiology Studies: VAS US DUPLEX DIALYSIS ACCESS (AVF, AVG)  Result Date: 10/08/2022 DIALYSIS ACCESS Patient Name:  AGUSTA HACKENBERG  Date of Exam:   10/07/2022 Medical Rec #: [id number]            Accession #:    [id number] Date of Birth: Apr 17, 1941           Patient Gender: F Patient Age:   40 years Exam Location:  [facility] Procedure:      VAS US DUPLEX DIALYSIS ACCESS (AVF, AVG) Referring Phys: Heath Lark --------------------------------------------------------------------------------  Reason for Exam: Rule out pseudoaneurysm or other active bleeding issue.                  Swollen, painful left upper extremity status post 2nd  stage                  basilic transposition. Access Site: Left Upper Extremity. Access Type: Basilic vein transposition. Limitations: Overlying edema. Performing Technologist: Sherren Kerns RVS  Examination Guidelines: A complete evaluation includes B-mode imaging, spectral Doppler, color  Doppler, and power Doppler as needed of all accessible portions of each vessel. Unilateral testing is considered an integral part of a complete examination. Limited examinations for reoccurring indications may be performed as noted.  Findings:  +------------+----------+-------------+----------+------------------------+ OUTFLOW VEINPSV (cm/s)Diameter (cm)Depth (cm)        Describe         +------------+----------+-------------+----------+------------------------+ Shoulder       639                           fistula at axillary area +------------+----------+-------------+----------+------------------------+   Summary: High grade stenosis noted near the axilla possibly created by a large hematoma noted in same area.   Diagnosing physician: Coral Else MD Electronically signed by Coral Else MD on 10/08/2022 at 11:25:23 AM.   --------------------------------------------------------------------------------   Final    MR BRAIN WO CONTRAST  Result Date: 10/07/2022 CLINICAL DATA:  TIA.  Altered mental status EXAM: MRI HEAD WITHOUT CONTRAST TECHNIQUE: Multiplanar, multiecho pulse sequences of the brain and surrounding structures were obtained without intravenous contrast. COMPARISON:  Head CT from yesterday FINDINGS: Brain: No acute infarction, hemorrhage, hydrocephalus, extra-axial collection or mass lesion. Mild chronic small vessel ischemic type change in the cerebral white matter. Age normal brain volume Vascular: Normal flow voids. Skull and upper cervical spine: Normal marrow signal. Sinuses/Orbits: Bilateral cataract resection.  No pathologic finding IMPRESSION: No acute finding.  Unremarkable MRI of the brain  for age. Electronically Signed   By: Tiburcio Pea M.D.   On: 10/07/2022 04:46   CT Head Wo Contrast  Result Date: 10/06/2022 CLINICAL DATA:  Mental status change EXAM: CT HEAD WITHOUT CONTRAST TECHNIQUE: Contiguous axial images were obtained from the base of the skull through the vertex without intravenous contrast. RADIATION DOSE REDUCTION: This exam was performed according to the departmental dose-optimization program which includes automated exposure control, adjustment of the mA and/or kV according to patient size and/or use of iterative reconstruction technique. COMPARISON:  CT of the head 09/21/2022 FINDINGS: Brain: No evidence of acute infarction, hemorrhage, hydrocephalus, extra-axial collection or mass lesion/mass effect. Again seen is mild diffuse atrophy. There is stable mild periventricular white matter hypodensity, likely chronic small vessel ischemic change. There is an old lacunar infarct in the left basal ganglia which is new from prior. Vascular: No hyperdense vessel or unexpected calcification. Skull: Normal. Negative for fracture or focal lesion. Sinuses/Orbits: No acute finding. Other: None. IMPRESSION: 1. No acute intracranial process. 2. Stable mild diffuse atrophy and chronic small vessel ischemic change. 3. Old lacunar infarct in the left basal ganglia, new from prior. Electronically Signed   By: Darliss Cheney M.D.   On: 10/06/2022 22:48   DG Chest 2 View  Result Date: 10/06/2022 CLINICAL DATA:  Altered mental status EXAM: CHEST - 2 VIEW COMPARISON:  01/15/2008 FINDINGS: Right lung is grossly clear. Small left-sided pleural effusion with mild airspace disease at left base. Mild cardiomegaly. No pneumothorax. IMPRESSION: Small left pleural effusion with mild airspace disease at the left base which may be due to atelectasis or pneumonia. Electronically Signed   By: Jasmine Pang M.D.   On: 10/06/2022 22:29     LOS: 1 day   Lanae Boast, MD [facility]  10/08/2022, 1:06 PM

## 2022-10-08 NOTE — Progress Notes (Addendum)
  Progress Note    10/08/2022 7:58 AM * No surgery found *  Subjective:  L arm feeling better today and less edematous   Vitals:   10/07/22 2027 10/08/22 0320  BP: (!) 126/58 121/62  Pulse: 67 65  Resp: 16 15  Temp: 98.3 F (36.8 C) 98.1 F (36.7 C)  SpO2: 98% 94%   Physical Exam Lungs:  non labored Extremities:  palpable L radial pulse; palpable thrill in ffistula throughout upper arm Neurologic: A&O   CBC    Component Value Date/Time   WBC 14.6 (H) 10/08/2022 0301   RBC 3.00 (L) 10/08/2022 0301   HGB 8.4 (L) 10/08/2022 0301   HGB 10.3 (L) 06/16/2020 1517   HCT 26.0 (L) 10/08/2022 0301   HCT 31.4 (L) 06/16/2020 1517   PLT 218 10/08/2022 0301   PLT 374 06/16/2020 1517   MCV 86.7 10/08/2022 0301   MCV 90 06/16/2020 1517   MCH 28.0 10/08/2022 0301   MCHC 32.3 10/08/2022 0301   RDW 17.9 (H) 10/08/2022 0301   RDW 12.6 06/16/2020 1517   LYMPHSABS 1.1 10/07/2022 0956   LYMPHSABS 2.4 06/16/2020 1517   MONOABS 0.4 10/07/2022 0956   EOSABS 0.1 10/07/2022 0956   EOSABS 0.2 06/16/2020 1517   BASOSABS 0.1 10/07/2022 0956   BASOSABS 0.1 06/16/2020 1517    BMET    Component Value Date/Time   NA 133 (L) 10/08/2022 0301   NA 142 06/16/2020 1517   K 4.4 10/08/2022 0301   CL 100 10/08/2022 0301   CO2 25 10/08/2022 0301   GLUCOSE 83 10/08/2022 0301   BUN 54 (H) 10/08/2022 0301   BUN 38 (H) 06/16/2020 1517   CREATININE 2.84 (H) 10/08/2022 0301   CALCIUM 8.3 (L) 10/08/2022 0301   GFRNONAA 16 (L) 10/08/2022 0301   GFRAA 39 (L) 06/16/2020 1517    INR    Component Value Date/Time   INR 1.1 10/07/2022 0138     Intake/Output Summary (Last 24 hours) at 10/08/2022 0758 Last data filed at 10/08/2022 0518 Gross per 24 hour  Intake 902.75 ml  Output 4050 ml  Net -3147.25 ml     Assessment/Plan:  82 y.o. female is s/p 2nd stage BVT with post op hematoma   L hand well perfused with palpable radial pulse Palpable thrill throughout fistula in upper arm despite  finding of axilla stenosis related to hematoma seen on duplex Gentle ACE from fingers to shoulder; encouraged elevation Dr. Chestine Spore to evaluate this morning to decide on conservative management versus return to OR for hematoma evacuation   Emilie Rutter, PA-C [facility] [phone] 10/08/2022 7:58 AM  I have seen and evaluated the patient. I agree with the PA note as documented above.  Her left arm fistula has an excellent thrill even with the duplex findings.  I do not think there is any focal hematoma on exam that warrants washout.  All of her pain is improved.  I wrapped her arm with a gentle Ace.  I discussed arm elevation.  This should all improve with conservative management.  She has very frail skin.  She can have a diet today.  Cephus Shelling, MD [facility] [place] Office: [phone]

## 2022-10-08 NOTE — Hospital Course (Addendum)
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 Chestine Spore on 4/26 After the procedure she stated that she had had significant pain and soreness in the left arm. She reported that she had been taking Tylenol 2 tablets possibly 500 mg every 4 hours due to her symptoms.She had notified vascular surgery and that sent in a stronger medication but she had not picked it up yet.  Since the procedure she reports that she has been very lethargic She is independent and able to complete all of her ADLs at baseline.since the surgery had been having difficulty getting around and normally ambulated without assistance. She also seemed to be having a difficult time and completing tasks which was unusual. The patient had recently gone to [place] and required hospitalization while there due to metabolic derangements where her calcium and magnesium levels were significantly low that was thought secondary to her ileal conduit. In ED, afebrile with mild tachypnea and all other vital signs maintained.  Labs 4/27 significant for WBC 21.9, hemoglobin 8.5, sodium 131, 64, creatinine 3.13, calcium 8.4, albumin 2.7, AST 75, and lactic acid 1.2.  Chest x-ray noted a small left-sided pleural effusion with mild airspace disease in left lung base concerning for atelectasis or pneumonia.  CT scan of the head showed no acute abnormality and infarct in the left basal ganglia new from prior study.  Blood cultures have been obtained.  Patient had been given 1 L of normal saline IV fluids and Rocephin 1 g IV. Patient was admitted and given antibiotics, seen by vascular surgery given her acute blood loss anemia with significant  bruising in the setting of recent left upper arm vascular procedure.  Per vascular she has very frail skin and because of tunneling PRN with bruising and swelling which significantly improved.  Needed 1 unit of blood transfusion and hemoglobin is stable and uptrending.  She feels much improved has fluid thrills although vascular reports showed stenosis, she has been cleared for discharge by vascular, PT OT recommended for home health. She is stabilized and being discharged today

## 2022-10-08 NOTE — Evaluation (Signed)
Occupational Therapy Evaluation Patient Details Name: Linda Rice MRN: [id number] DOB: [birth year] Today's Date: 10/08/2022   History of Present Illness Alva Kuenzel is a 82 y.o. female presened to hospital after being noted to be acutely altered. Found to have scute metabolic encephalopathy, CAP, and UTI as well as LUE venous dopplar--high grade stenosis noted near the axilla possibly created by a large hematoma noted in same area. 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 Chestine Spore on 4/26   Clinical Impression   This 82 yo female admitted for above presents to acute OT with PLOF before AVF of being totally independent with basic ADLs, IADLs, and driving. She currently is setup/S-min A with basic ADLs and min guard A with ambulation while pushing IV pole (normally she does not have to use an AD to ambulate. She will continue to benefit from acute OT with continued inpatient follow up therapy, <3 hours/day to then return home alone (with her dog Rufus).       Recommendations for follow up therapy are one component of a multi-disciplinary discharge planning process, led by the attending physician.  Recommendations may be updated based on patient status, additional functional criteria and insurance authorization.   Assistance Recommended at Discharge Frequent or constant Supervision/Assistance  Patient can return home with the following A little help with walking and/or transfers;A lot of help with bathing/dressing/bathroom;Assistance with cooking/housework;Assist for transportation    Functional Status Assessment  Patient has had a recent decline in their functional status and demonstrates the ability to make significant  improvements in function in a reasonable and predictable amount of time.  Equipment Recommendations  None recommended by OT       Precautions / Restrictions Precautions Precautions: Fall Restrictions Weight Bearing Restrictions: No      Mobility Bed Mobility Overal bed mobility: Needs Assistance Bed Mobility: Supine to Sit     Supine to sit: Supervision, HOB elevated          Transfers Overall transfer level: Needs assistance   Transfers: Sit to/from Stand Sit to Stand: Min guard           General transfer comment: once standing holding onto IV pole for balance      Balance Overall balance assessment: Needs assistance Sitting-balance support: No upper extremity supported, Feet supported Sitting balance-Leahy Scale: Good     Standing balance support: Single extremity supported Standing balance-Leahy Scale: Poor                             ADL either performed or assessed with clinical judgement   ADL Overall ADL's : Needs assistance/impaired Eating/Feeding: Set up;Sitting   Grooming: Min guard;Standing;Wash/dry hands   Upper Body Bathing: Minimal assistance;Sitting   Lower Body Bathing: Sit to/from stand;Minimal assistance Lower Body Bathing Details (indicate cue type and reason): min guard A sit<>stand Upper Body Dressing : Minimal assistance;Sitting   Lower Body Dressing: Minimal assistance Lower Body Dressing Details (indicate cue type and reason): min guard A sit<>stand Toilet Transfer: Minimal assistance;Ambulation Toilet Transfer Details (indicate cue type and reason): pt pushing IV pole Toileting- Clothing Manipulation and Hygiene: Minimal assistance Toileting - Clothing Manipulation Details (indicate cue type and reason): min guard A sit<>stand  Vision Patient Visual Report: No change from baseline              Pertinent Vitals/Pain Pain Assessment Pain Assessment: No/denies pain     Hand Dominance  Right   Extremity/Trunk Assessment Upper Extremity Assessment Upper Extremity Assessment: LUE deficits/detail LUE Deficits / Details: still with alot of brusing and edema, re-wrapped ace wraps on it due to to ace wrap no covering some parts of arm and edema was pooling in these areas LUE Coordination: decreased gross motor           Communication Communication Communication: No difficulties   Cognition Arousal/Alertness: Awake/alert Behavior During Therapy: WFL for tasks assessed/performed Overall Cognitive Status: Within Functional Limits for tasks assessed                                                  Home Living Family/patient expects to be discharged to:: Private residence Living Arrangements: Alone Available Help at Discharge: Family;Available PRN/intermittently Type of Home: Independent living facility ([facility] [place]) Home Access: Level entry     Home Layout: One level     Bathroom Shower/Tub: Chief Strategy Officer: Handicapped height     Home Equipment: Hand held shower head;Grab bars - tub/shower;Grab bars - toilet;Shower seat - built in   Additional Comments: Has a golden doodle "Rufus", normally does not use any AD for ambulation, does all of her own IADLs      Prior Functioning/Environment Prior Level of Function : Independent/Modified Independent;Driving                        OT Problem List: Impaired balance (sitting and/or standing);Increased edema;Impaired UE functional use      OT Treatment/Interventions: Self-care/ADL training;DME and/or AE instruction;Patient/family education;Balance training (edema control)    OT Goals(Current goals can be found in the care plan section) Acute Rehab OT Goals Patient Stated Goal: to return to her independent living [facility] [place] OT Goal Formulation: With patient Time For Goal Achievement: 10/22/22 Potential to Achieve Goals: Good  OT Frequency: Min  2X/week       AM-PAC OT "6 Clicks" Daily Activity     Outcome Measure Help from another person eating meals?: A Little Help from another person taking care of personal grooming?: A Little Help from another person toileting, which includes using toliet, bedpan, or urinal?: A Little Help from another person bathing (including washing, rinsing, drying)?: A Little Help from another person to put on and taking off regular upper body clothing?: A Little Help from another person to put on and taking off regular lower body clothing?: A Little 6 Click Score: 18   End of Session Equipment Utilized During Treatment: Gait belt (pushing IV pole) Nurse Communication:  (urine cloudy, re-wrapped LUE)  Activity Tolerance: Patient tolerated treatment well Patient left: in chair;with call bell/phone within reach;with chair alarm set  OT Visit Diagnosis: Unsteadiness on feet (R26.81);Other abnormalities of gait and mobility (R26.89);Muscle weakness (generalized) (M62.81)                Time: 2956-2130 OT Time Calculation (min): 49 min Charges:  OT General Charges $OT Visit: 1 Visit OT Evaluation $OT Eval Moderate Complexity: 1 Mod OT Treatments $Self Care/Home Management : 23-37 mins  Lindon Romp OT Acute Rehabilitation Services Office [phone]  Evette Georges 10/08/2022, 2:35 PM

## 2022-10-09 ENCOUNTER — Other Ambulatory Visit (HOSPITAL_COMMUNITY): Payer: Self-pay

## 2022-10-09 ENCOUNTER — Encounter (HOSPITAL_COMMUNITY): Payer: Self-pay

## 2022-10-09 DIAGNOSIS — G9341 Metabolic encephalopathy: Secondary | ICD-10-CM | POA: Diagnosis not present

## 2022-10-09 LAB — COMPREHENSIVE METABOLIC PANEL
ALT: 12 U/L (ref 0–44)
AST: 33 U/L (ref 15–41)
Albumin: 2.4 g/dL — ABNORMAL LOW (ref 3.5–5.0)
Alkaline Phosphatase: 58 U/L (ref 38–126)
Anion gap: 11 (ref 5–15)
BUN: 45 mg/dL — ABNORMAL HIGH (ref 8–23)
CO2: 27 mmol/L (ref 22–32)
Calcium: 8.5 mg/dL — ABNORMAL LOW (ref 8.9–10.3)
Chloride: 100 mmol/L (ref 98–111)
Creatinine, Ser: 2.76 mg/dL — ABNORMAL HIGH (ref 0.44–1.00)
GFR, Estimated: 17 mL/min — ABNORMAL LOW (ref >60)
Glucose, Bld: 81 mg/dL (ref 70–99)
Potassium: 4.2 mmol/L (ref 3.5–5.1)
Sodium: 138 mmol/L (ref 135–145)
Total Bilirubin: 0.6 mg/dL (ref 0.3–1.2)
Total Protein: 5.6 g/dL — ABNORMAL LOW (ref 6.5–8.1)

## 2022-10-09 LAB — CBC
HCT: 27.8 % — ABNORMAL LOW (ref 36.0–46.0)
Hemoglobin: 9.2 g/dL — ABNORMAL LOW (ref 12.0–15.0)
MCH: 28.5 pg (ref 26.0–34.0)
MCHC: 33.1 g/dL (ref 30.0–36.0)
MCV: 86.1 fL (ref 80.0–100.0)
Platelets: 231 10*3/uL (ref 150–400)
RBC: 3.23 MIL/uL — ABNORMAL LOW (ref 3.87–5.11)
RDW: 17.5 % — ABNORMAL HIGH (ref 11.5–15.5)
WBC: 9.8 10*3/uL (ref 4.0–10.5)
nRBC: 0 % (ref 0.0–0.2)

## 2022-10-09 LAB — CULTURE, BLOOD (ROUTINE X 2): Culture: NO GROWTH

## 2022-10-09 LAB — PHOSPHORUS: Phosphorus: 3.9 mg/dL (ref 2.5–4.6)

## 2022-10-09 LAB — LEGIONELLA PNEUMOPHILA SEROGP 1 UR AG: L. pneumophila Serogp 1 Ur Ag: NEGATIVE

## 2022-10-09 LAB — GLUCOSE, CAPILLARY: Glucose-Capillary: 81 mg/dL (ref 70–99)

## 2022-10-09 MED ORDER — AMOXICILLIN-POT CLAVULANATE 500-125 MG PO TABS
1.0000 | ORAL_TABLET | Freq: Every day | ORAL | Status: DC
  Administered 2022-10-09: 1 via ORAL
  Filled 2022-10-09: qty 1

## 2022-10-09 MED ORDER — AMOXICILLIN-POT CLAVULANATE 500-125 MG PO TABS
1.0000 | ORAL_TABLET | Freq: Every day | ORAL | 0 refills | Status: AC
  Filled 2022-10-09: qty 2, 2d supply, fill #0

## 2022-10-09 MED ORDER — AMOXICILLIN-POT CLAVULANATE 875-125 MG PO TABS: 1.0000 | ORAL_TABLET | Freq: Two times a day (BID) | ORAL | 0 refills | Status: DC

## 2022-10-09 NOTE — Progress Notes (Signed)
Occupational Therapy Treatment Patient Details Name: Linda Rice MRN: [id number] DOB: May 28, 1941 Today's Date: 10/09/2022   History of present illness Linda Rice is a 82 y.o. female presened to hospital after being noted to be acutely altered. Found to have scute metabolic encephalopathy, CAP, and UTI as well as LUE venous dopplar--high grade stenosis noted near the axilla possibly created by a large hematoma noted in same area. 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 Chestine Spore on 4/26   OT comments  Patient is making good gains with OT treatment. Patient making gains with dressing requiring min assist for UB dressing with education to donn LUE first and min assist with LB dressing with assistance to thread legs into clothing and patient able to pull up while standing. Patient is expected to return to [facility] independent living facility to receive HHOT. Acute OT to continue to follow.    Recommendations for follow up therapy are one component of a multi-disciplinary discharge planning process, led by the attending physician.  Recommendations may be updated based on patient status, additional functional criteria and insurance authorization.    Assistance Recommended at Discharge Frequent or constant Supervision/Assistance  Patient can return home with the following  A little help with walking and/or transfers;A lot of help with bathing/dressing/bathroom;Assistance with cooking/housework;Assist for transportation   Equipment Recommendations  None recommended by OT    Recommendations for Other Services      Precautions / Restrictions Precautions Precautions: Fall Restrictions Weight Bearing Restrictions: No        Mobility Bed Mobility Overal bed mobility: Needs Assistance Bed Mobility: Supine to Sit     Supine to sit: Supervision, HOB elevated          Transfers Overall transfer level: Needs assistance Equipment used: 1 person hand held assist Transfers: Sit to/from Stand Sit to Stand: Min guard           General transfer comment: min guard with HHA     Balance Overall balance assessment: Needs assistance Sitting-balance support: No upper extremity supported, Feet supported Sitting balance-Leahy Scale: Good     Standing balance support: Single extremity supported Standing balance-Leahy Scale: Poor Standing balance comment: relies on at least 1 Ue support                           ADL either performed or assessed with clinical judgement   ADL Overall ADL's : Needs assistance/impaired     Grooming: Wash/dry hands;Wash/dry face;Oral care;Supervision/safety;Standing Grooming Details (indicate cue type and reason): at sink         Upper Body Dressing : Minimal assistance;Sitting Upper Body Dressing Details (indicate cue type and reason): donn pullover top Lower Body Dressing: Minimal assistance Lower Body Dressing Details (indicate cue type and reason): required assistance threading legs through pants Toilet Transfer: Min guard;Ambulation Toilet Transfer Details (indicate cue type and reason): HHA                Extremity/Trunk Assessment              Vision       Perception     Praxis      Cognition Arousal/Alertness: Awake/alert Behavior During Therapy: WFL for tasks assessed/performed Overall Cognitive Status: Within Functional Limits for  tasks assessed                                          Exercises      Shoulder Instructions       General Comments VSS    Pertinent Vitals/ Pain       Pain Assessment Pain Assessment: No/denies pain  Home Living                                           Prior Functioning/Environment              Frequency  Min 2X/week        Progress Toward Goals  OT Goals(current goals can now be found in the care plan section)  Progress towards OT goals: Progressing toward goals  Acute Rehab OT Goals Patient Stated Goal: go back to [facility] OT Goal Formulation: With patient Time For Goal Achievement: 10/22/22 Potential to Achieve Goals: Good ADL Goals Pt Will Perform Grooming: Independently;standing Pt Will Perform Upper Body Bathing: Independently;sitting;standing Pt Will Perform Lower Body Bathing: Independently;sit to/from stand Pt Will Perform Upper Body Dressing: Independently;sitting;standing Pt Will Perform Lower Body Dressing: Independently;sit to/from stand Pt Will Transfer to Toilet: Independently;ambulating;grab bars Pt Will Perform Toileting - Clothing Manipulation and hygiene: Independently;sit to/from stand Additional ADL Goal #1: Pt will able to state and demonstrate edema control strategies for LUE  Plan Discharge plan remains appropriate    Co-evaluation                 AM-PAC OT "6 Clicks" Daily Activity     Outcome Measure   Help from another person eating meals?: A Little Help from another person taking care of personal grooming?: A Little Help from another person toileting, which includes using toliet, bedpan, or urinal?: A Little Help from another person bathing (including washing, rinsing, drying)?: A Little Help from another person to put on and taking off regular upper body clothing?: A Little Help from another person to put on and taking off regular lower body clothing?: A Little 6 Click Score: 18    End of Session Equipment Utilized During Treatment: Gait belt  OT Visit Diagnosis: Unsteadiness on feet (R26.81);Other abnormalities of gait and mobility (R26.89);Muscle weakness (generalized) (M62.81)   Activity Tolerance Patient tolerated treatment well   Patient Left in chair;with call  bell/phone within reach;with chair alarm set   Nurse Communication Mobility status        Time: 1610-9604 OT Time Calculation (min): 26 min  Charges: OT General Charges $OT Visit: 1 Visit OT Treatments $Self Care/Home Management : 8-22 mins $Therapeutic Activity: 8-22 mins  Alfonse Flavors, OTA Acute Rehabilitation Services  Office [phone]   Dewain Penning 10/09/2022, 1:28 PM

## 2022-10-09 NOTE — Progress Notes (Signed)
Physical Therapy Treatment Patient Details Name: Linda Rice MRN: [id number] DOB: 04-01-1941 Today's Date: 10/09/2022   History of Present Illness Linda Rice is a 82 y.o. female presened to hospital after being noted to be acutely altered. Found to have scute metabolic encephalopathy, CAP, and UTI as well as LUE venous dopplar--high grade stenosis noted near the axilla possibly created by a large hematoma noted in same area. 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 Chestine Spore on 4/26    PT Comments    Pt plans to d/c today, is eager to do so with support of family. Pt ambulatory in hallway with use of single point cane, pt initially requiring close guard but transitioning to supervision with improved pt steadiness. PT has a cane at home that she plans to use at d/c, also has family flying in to assist her at d/c as needed. Pt appropriate to d/c from PT standpoint.     Recommendations for follow up therapy are one component of a multi-disciplinary discharge planning process, led by the attending physician.  Recommendations may be updated based on patient status, additional functional criteria and insurance authorization.  Follow Up Recommendations       Assistance Recommended at Discharge Set up Supervision/Assistance  Patient can return home with the following A little help with walking and/or transfers   Equipment Recommendations  None recommended by PT    Recommendations for Other Services       Precautions / Restrictions Precautions Precautions: Fall Restrictions Weight Bearing Restrictions: No     Mobility  Bed Mobility Overal bed mobility: Needs Assistance Bed Mobility: Sit to Supine       Sit  to supine: Supervision, HOB elevated        Transfers Overall transfer level: Needs assistance Equipment used: Straight cane Transfers: Sit to/from Stand Sit to Stand: Supervision           General transfer comment: for safety, cues for cane use    Ambulation/Gait Ambulation/Gait assistance: Supervision Gait Distance (Feet): 225 Feet Assistive device: Straight cane Gait Pattern/deviations: Step-through pattern, Decreased stride length Gait velocity: decr     General Gait Details: close guard for safety initially given min unsteadiness, transitioning to supervision. Cues for sequencing gait with cane   Stairs             Wheelchair Mobility    Modified Rankin (Stroke Patients Only)       Balance Overall balance assessment: Needs assistance Sitting-balance support: No upper extremity supported, Feet supported Sitting balance-Leahy Scale: Good     Standing balance support: Single extremity supported Standing balance-Leahy Scale: Poor Standing balance comment: relies on at least 1 Ue support                            Cognition Arousal/Alertness: Awake/alert Behavior During Therapy: WFL for tasks assessed/performed Overall Cognitive Status: Within Functional Limits for tasks assessed                                          Exercises      General Comments        Pertinent Vitals/Pain Pain  Assessment Pain Assessment: No/denies pain    Home Living                          Prior Function            PT Goals (current goals can now be found in the care plan section) Acute Rehab PT Goals PT Goal Formulation: With patient Time For Goal Achievement: 10/22/22 Potential to Achieve Goals: Good Progress towards PT goals: Progressing toward goals    Frequency    Min 3X/week      PT Plan Current plan remains appropriate    Co-evaluation              AM-PAC PT "6 Clicks" Mobility   Outcome  Measure  Help needed turning from your back to your side while in a flat bed without using bedrails?: None Help needed moving from lying on your back to sitting on the side of a flat bed without using bedrails?: None Help needed moving to and from a bed to a chair (including a wheelchair)?: A Little Help needed standing up from a chair using your arms (e.g., wheelchair or bedside chair)?: A Little Help needed to walk in hospital room?: A Little Help needed climbing 3-5 steps with a railing? : A Little 6 Click Score: 20    End of Session   Activity Tolerance: Patient tolerated treatment well Patient left: in bed;with call bell/phone within reach;with bed alarm set Nurse Communication: Mobility status PT Visit Diagnosis: Muscle weakness (generalized) (M62.81)     Time: 1610-9604 PT Time Calculation (min) (ACUTE ONLY): 14 min  Charges:  $Gait Training: 8-22 mins                     Linda Rice, PT DPT Acute Rehabilitation Services Secure Chat Preferred  Office [phone]    Linda Rice 10/09/2022, 10:14 AM

## 2022-10-09 NOTE — Progress Notes (Signed)
[facility]  Subjective  -no complaints   Objective (!) 116/51 (!) 59 98 F (36.7 C) (Oral) 16 95%  Intake/Output Summary (Last 24 hours) at 10/09/2022 0806 Last data filed at 10/09/2022 0459 Gross per 24 hour  Intake 580 ml  Output 2000 ml  Net -1420 ml    Left basilic vein fistula with excellent thrill, ecchymosis improving, no focal hematoma  Laboratory Lab Results: Recent Labs    10/08/22 0301 10/09/22 0345  WBC 14.6* 9.8  HGB 8.4* 9.2*  HCT 26.0* 27.8*  PLT 218 231   BMET Recent Labs    10/08/22 0301 10/09/22 0345  NA 133* 138  K 4.4 4.2  CL 100 100  CO2 25 27  GLUCOSE 83 81  BUN 54* 45*  CREATININE 2.84* 2.76*  CALCIUM 8.3* 8.5*    COAG Lab Results  Component Value Date   INR 1.1 10/07/2022   INR 0.97 08/26/2015   INR 1.4 01/17/2008   No results found for: "PTT"  Assessment/Planning:  Status post left second stage basilic vein fistula.  Admitted with altered mental status that has improved.  Ecchymosis is improving.  No focal hematoma that needs drainage.  I wrapped her arm with an Ace.  Excellent thrill.  She has follow-up with [place] in the office.  Cephus Shelling 10/09/2022 8:06 AM --

## 2022-10-09 NOTE — Discharge Summary (Signed)
Physician Discharge Summary  Linda Rice ZOX:[id number] DOB: July 22, 1940 DOA: 10/06/2022  PCP: Zoila Shutter, MD  Admit date: 10/06/2022 Discharge date: 10/09/2022 Recommendations for Outpatient Follow-up:  Follow up with PCP in 1 weeks-call for appointment Please obtain BMP/CBC in one week  Discharge Dispo: Home w/ [facility] Discharge Condition: Stable Code Status:   Code Status: Full Code Diet recommendation:  Diet Order             Diet renal with fluid restriction Fluid restriction: 1200 mL Fluid; Room service appropriate? Yes; Fluid consistency: Thin  Diet effective now                    Brief/Interim Summary: 82 y.o.f w/ hypertension, hyperlipidemia, CKD stage IV, hypothyroidism, alopecia, inflammatory cystitis of the bladder s/p cystectomy and ileal conduit with urostomy, chronic osteomyelitis of the right knee on doxycycline, and history of aspergillous presented after being noted to be acutely altered, she had left second stage basilic vein transposition surgery performed by Dr. Chestine Spore on 4/26 After the procedure she stated that she had had significant pain and soreness in the left arm. 82 She reported that she had been taking Tylenol 2 tablets possibly 500 mg every 4 hours due to her symptoms.She had notified vascular surgery and that sent in a stronger medication but she had not picked it up yet.  Since the procedure she reports that she has been very lethargic She is independent and able to complete all of her ADLs at baseline.since the surgery had been having difficulty getting around and normally ambulated without assistance. She also seemed to be having a difficult time and completing tasks which was unusual. The patient had recently gone to [place] and required hospitalization while there due to metabolic derangements where her calcium and magnesium levels were significantly low that was thought secondary to her ileal conduit. In ED, afebrile with mild tachypnea and all  other vital signs maintained.  Labs 4/27 significant for WBC 21.9, hemoglobin 8.5, sodium 131, 64, creatinine 3.13, calcium 8.4, albumin 2.7, AST 75, and lactic acid 1.2.  Chest x-ray noted a small left-sided pleural effusion with mild airspace disease in left lung base concerning for atelectasis or pneumonia.  CT scan of the head showed no acute abnormality and infarct in the left basal ganglia new from prior study.  Blood cultures have been obtained.  Patient had been given 1 L of normal saline IV fluids and Rocephin 1 g IV. Patient was admitted and given antibiotics, seen by vascular surgery given her acute blood loss anemia with significant bruising in the setting of recent left upper arm vascular procedure.  Per vascular she has very frail skin and because of tunneling PRN with bruising and swelling which significantly improved.  Needed 1 unit of blood transfusion and hemoglobin is stable and uptrending.  She feels much improved has fluid thrills although vascular reports showed stenosis, she has been cleared for discharge by vascular, PT OT recommended for home health. She is stabilized and being discharged today    Discharge Diagnoses:  Principal Problem:   Acute metabolic encephalopathy Active Problems:   Postoperative anemia   Leukocytosis   CAP (community acquired pneumonia) due to Mycoplasma pneumoniae   Bacteriuria   Acute kidney injury superimposed on chronic kidney disease (HCC)   Diastolic dysfunction   Elevated AST (SGOT)   Chronic osteomyelitis (HCC)   Hypothyroidism Postoperative anemia  Anemia of chronic disease S/p left arm 2nd Stage BVT w/ post op Hematoma:  Baseline hb~ 9.2-9.5 prior to the procedure.Hemoglobin had been 11.4 last month.  Received 1 unit PRBC transfusion for hemoglobin 7.6 g and appropriately increased and uptrending, seen by vascular this morning significant bruising swelling on the left arm is not improving this is in the setting of recent tunneling for  vascular procedure in the setting of very thin skin.  Continue with Ace wrap for another day and elevate the arm and follow-up with vascular as outpatient.  Vascular ultrasound Duplex "High grade stenosis noted near the axilla possibly created by a large hematoma noted in same area"-but with intact fluid thrill vascular aware and no further procedures needed  Recent Labs  Lab 10/06/22 2209 10/07/22 0956 10/07/22 1749 10/08/22 0301 10/09/22 0345  HGB 8.5* 7.6* 8.9* 8.4* 9.2*  HCT 26.0* 23.9* 26.8* 26.0* 27.8*    Acute metabolic encephalopathy: Patient confused at home has been weak and deconditioned currently oriented x 3 CT -stable mild diffuse atrophy with chronic small vessel ischemia, but made note of an old lacunar infarct in the left basal ganglia that is new from prior CT done on 4/12 However, MRI of the brain was negative for any acute abnormality.  Free T4 normal Tylenol level less than 10.  Seen by PT OT will need home health PT.   Community-acquired pneumonia Leukocytosis: Pro-Cal was elevated significantly, respiratory virus panel negative, urine culture multiple species, blood culture ngtd-overall clinically improved leukocytosis resolved afebrile will discharge on oral Augmentin.  She is on doxycycline for chronic osteomyelitis.   Recent Labs  Lab 10/06/22 2209 10/07/22 0956 10/08/22 0301 10/09/22 0345  WBC 21.9* 17.3* 14.6* 9.8    S/P illeostomy w/ bacteriuria: Likely in the setting of ileostomy, urine culture with multiple species.  On empiric antibiotics as above   AKI on CKD IV: B/L creat ~ 2.5-2.7, now at 2.8, continue oral hydration, p.o. Lasix, sodium bicarb twice daily.  Creatinine improving to 2.7 follow-up with PCP to monitor continue home meds.  Recent Labs  Lab 10/05/22 0634 10/06/22 2209 10/07/22 0956 10/08/22 0301 10/09/22 0345  BUN 67* 64* 58* 54* 45*  CREATININE 2.70* 3.13* 2.89* 2.84* 2.76*   Diastolic dysfunction chronic: Not volume overloaded, BNP  was noted to be mildly elevated at 353.2.CXR noted concern for possible small left-sided pleural effusion.Last echocardiogram noted EF to be 55 to 60% with grade 1 diastolic dysfunction back in 07/2022.  Continue home Lasix.     Episode of transient hypoglycemia at 69 at 0536 am 4/28 no recurrence since.  Tolerating diet Recent Labs  Lab 10/07/22 0536 10/07/22 0647 10/08/22 1723 10/09/22 0019  GLUCAP 68* 86 76 81   Essential hypertension: Well-controlled continue Toprol and Lasix Elevated QMV:HQION.  AST 75 on admission.  Resolved Recent Labs  Lab 10/06/22 2209 10/07/22 0138 10/07/22 0956 10/08/22 0301 10/09/22 0345  AST 75*  --   --   --  33  ALT 19  --   --   --  12  ALKPHOS 51  --   --   --  58  BILITOT 0.7  --   --   --  0.6  PROT 5.9*  --   --   --  5.6*  ALBUMIN 2.7*  --  2.4* 2.3* 2.4*  INR  --  1.1  --   --   --   PLT 271  --  236 218 231   Osteomyelitis of the right knee on chronic doxycycline witth and follows with ID   Hypothyroidism: Slightly  elevated TSH but normal free T4 continue home levothyroxine adjust dose outpatient with repeat check in 3 to 4 weeks    Consults: Vascuilar  Subjective: Patient is alert awake oriented, Her left arm swelling/pain is much improved. She feels ready for discharge home today.  Discharge Exam: Vitals:   10/09/22 0339 10/09/22 0804  BP: (!) 140/59 (!) 116/51  Pulse:  (!) 59  Resp: 18 16  Temp: 97.8 F (36.6 C) 98 F (36.7 C)  SpO2: 97% 95%   General: Pt is alert, awake, not in acute distress Cardiovascular: RRR, S1/S2 +, no rubs, no gallops Respiratory: CTA bilaterally, no wheezing, no rhonchi Abdominal: Soft, NT, ND, bowel sounds + Extremities: no edema, no cyanosis, left arm ecchymotic fluid thrill intact  Discharge Instructions  Discharge Instructions     Discharge instructions   Complete by: As directed    Keep left arm elevated for few days and the ace wrap is there for comfort and can come off at home in a  day  Please call call MD or return to ER for similar or worsening recurring problem that brought you to hospital or if any fever,nausea/vomiting,abdominal pain, uncontrolled pain, chest pain,  shortness of breath or any other alarming symptoms.  Please follow-up your doctor as instructed in a week time and call the office for appointment.  Please avoid alcohol, smoking, or any other illicit substance and maintain healthy habits including taking your regular medications as prescribed.  You were cared for by a hospitalist during your hospital stay. If you have any questions about your discharge medications or the care you received while you were in the hospital after you are discharged, you can call the unit and ask to speak with the hospitalist on call if the hospitalist that took care of you is not available.  Once you are discharged, your primary care physician will handle any further medical issues. Please note that NO REFILLS for any discharge medications will be authorized once you are discharged, as it is imperative that you return to your primary care physician (or establish a relationship with a primary care physician if you do not have one) for your aftercare needs so that they can reassess your need for medications and monitor your lab values   Increase activity slowly   Complete by: As directed       Allergies as of 10/09/2022       Reactions   Egg-derived Products Hives   eczema Poultry Rash and GI symptoms   Erythromycin Other (See Comments)   REACTION: stomach symptoms  and hives   Fish Allergy Other (See Comments), Swelling   Lip swelling and eczema With fins hives   Gatifloxacin Hives, Nausea Only, Swelling   Other Anaphylaxis, Itching, Swelling   Legumes- all beans and peas and tree nuts cause lip swelling and eczema Orange Vegetables cause eczema    Peanut-containing Drug Products Anaphylaxis   And tree nuts    Poultry Meal Diarrhea, Itching   Banana Itching    Keflex [cephalexin] Rash   Latex Itching        Medication List     TAKE these medications    albuterol 108 (90 Base) MCG/ACT inhaler Commonly known as: VENTOLIN HFA Inhale 1-2 puffs into the lungs every 6 (six) hours as needed for wheezing or shortness of breath.   amoxicillin-clavulanate 500-125 MG tablet Commonly known as: AUGMENTIN Take 1 tablet by mouth daily for 2 days. Start taking on: Oct 10, 2022  atorvastatin 10 MG tablet Commonly known as: LIPITOR Take 10 mg by mouth in the morning.   CALCIUM 500 + D PO Take 1 tablet by mouth daily.   cetirizine 10 MG tablet Commonly known as: ZYRTEC Take 10 mg by mouth at bedtime.   Darbepoetin Alfa 40 MCG/0.4ML Sosy injection Commonly known as: ARANESP Inject 40 mcg into the skin every 28 (twenty-eight) days.   doxycycline 100 MG capsule Commonly known as: VIBRAMYCIN Take 100 mg by mouth 2 (two) times daily.   EPINEPHrine 0.3 mg/0.3 mL Soaj injection Commonly known as: EPI-PEN Inject 0.3 mLs (0.3 mg total) into the muscle as needed for anaphylaxis.   furosemide 40 MG tablet Commonly known as: LASIX Take 40 mg by mouth daily.   HYDROcodone-acetaminophen 5-325 MG tablet Commonly known as: Norco Take 1 tablet by mouth every 6 (six) hours as needed for moderate pain.   Levothyroxine Sodium 88 MCG Caps Take 88 mcg by mouth daily before breakfast.   Magnesium 500 MG Tabs Take 500 mg by mouth daily.   metoprolol tartrate 25 MG tablet Commonly known as: LOPRESSOR Take 1 tablet (25 mg total) by mouth in the morning, at noon, and at bedtime.   mirtazapine 30 MG tablet Commonly known as: REMERON Take 30 mg by mouth at bedtime.   multivitamin with minerals tablet Take 1 tablet by mouth daily. Woman   sodium bicarbonate 650 MG tablet Take 1,300 mg by mouth 2 (two) times daily.        Follow-up Information     [facility] Follow up in 2 week(s).   Specialty: Vascular  Surgery Why: sent Contact information: [address] [place] [address] [phone]               Allergies  Allergen Reactions   Egg-Derived Products Hives    eczema Poultry Rash and GI symptoms   Erythromycin Other (See Comments)    REACTION: stomach symptoms  and hives   Fish Allergy Other (See Comments) and Swelling    Lip swelling and eczema With fins hives    Gatifloxacin Hives, Nausea Only and Swelling   Other Anaphylaxis, Itching and Swelling    Legumes- all beans and peas and tree nuts cause lip swelling and eczema  Orange Vegetables cause eczema    Peanut-Containing Drug Products Anaphylaxis    And tree nuts    Poultry Meal Diarrhea and Itching   Banana Itching   Keflex [Cephalexin] Rash   Latex Itching    The results of significant diagnostics from this hospitalization (including imaging, microbiology, ancillary and laboratory) are listed below for reference.    Microbiology: Recent Results (from the past 240 hour(s))  Blood culture (routine x 2)     Status: None (Preliminary result)   Collection Time: 10/07/22  1:38 AM   Specimen: BLOOD LEFT ARM  Result Value Ref Range Status   Specimen Description BLOOD LEFT ARM  Final   Special Requests   Final    BOTTLES DRAWN AEROBIC AND ANAEROBIC Blood Culture results may not be optimal due to an excessive volume of blood received in culture bottles   Culture   Final    NO GROWTH 2 DAYS Performed at [facility], [address], [place], [place] [address]    Report Status PENDING  Incomplete  Blood culture (routine x 2)     Status: None (Preliminary result)   Collection Time: 10/07/22  1:38 AM  Specimen: BLOOD LEFT HAND  Result Value Ref Range Status   Specimen Description BLOOD LEFT HAND  Final   Special Requests   Final    BOTTLES DRAWN AEROBIC AND ANAEROBIC Blood Culture adequate volume   Culture   Final    NO GROWTH 2 DAYS Performed at [facility], [address] [place], [place] [address]    Report Status PENDING  Incomplete  Urine Culture (for pregnant, neutropenic or urologic patients or patients with an indwelling urinary catheter)     Status: Abnormal   Collection Time: 10/07/22  5:38 AM   Specimen: Urine, Clean Catch  Result Value Ref Range Status   Specimen Description URINE, CLEAN CATCH  Final   Special Requests   Final    NONE Performed at [facility], [address], [place], [place] [address]    Culture (A)  Final    10,000 COLONIES/mL MULTIPLE SPECIES PRESENT, SUGGEST RECOLLECTION   Report Status 10/08/2022 FINAL  Final  Respiratory (~20 pathogens) panel by PCR     Status: None   Collection Time: 10/07/22 10:50 AM   Specimen: Nasopharyngeal Swab; Respiratory  Result Value Ref Range Status   Adenovirus NOT DETECTED NOT DETECTED Final   Coronavirus 229E NOT DETECTED NOT DETECTED Final    Comment: (NOTE) The Coronavirus on the Respiratory Panel, DOES NOT test for the novel  Coronavirus (2019 nCoV)    Coronavirus HKU1 NOT DETECTED NOT DETECTED Final   Coronavirus NL63 NOT DETECTED NOT DETECTED Final   Coronavirus OC43 NOT DETECTED NOT DETECTED Final   Metapneumovirus NOT DETECTED NOT DETECTED Final   Rhinovirus / Enterovirus NOT DETECTED NOT DETECTED Final   Influenza A NOT DETECTED NOT DETECTED Final   Influenza B NOT DETECTED NOT DETECTED Final   Parainfluenza Virus 1 NOT DETECTED NOT DETECTED Final   Parainfluenza Virus 2 NOT DETECTED NOT DETECTED Final   Parainfluenza Virus 3 NOT DETECTED NOT DETECTED Final   Parainfluenza Virus 4 NOT DETECTED NOT DETECTED Final   Respiratory Syncytial Virus NOT DETECTED NOT DETECTED Final   Bordetella pertussis NOT DETECTED NOT DETECTED Final   Bordetella Parapertussis NOT DETECTED NOT DETECTED Final   Chlamydophila pneumoniae NOT DETECTED NOT DETECTED Final   Mycoplasma pneumoniae NOT DETECTED NOT DETECTED Final    Comment: Performed at [facility], [address],  [place], [place] [address]  SARS Coronavirus 2 by RT PCR (hospital order, performed in [facility] hospital lab) *cepheid single result test*     Status: None   Collection Time: 10/07/22 10:50 AM  Result Value Ref Range Status   SARS Coronavirus 2 by RT PCR NEGATIVE NEGATIVE Final    Comment: Performed at [facility], [address], [place], [place] [address]    Procedures/Studies: VAS US DUPLEX DIALYSIS ACCESS (AVF, AVG)  Result Date: 10/08/2022 DIALYSIS ACCESS Patient Name:  AUDRI KOZUB  Date of Exam:   10/07/2022 Medical Rec #: [id number]            Accession #:    [id number] Date of Birth: 1940/09/12           Patient Gender: F Patient Age:   73 years Exam Location:  [facility] Procedure:      VAS US DUPLEX DIALYSIS ACCESS (AVF, AVG) Referring Phys: Heath Lark --------------------------------------------------------------------------------  Reason for Exam: Rule out pseudoaneurysm or other active bleeding issue.  Swollen, painful left upper extremity status post 2nd stage                  basilic transposition. Access Site: Left Upper Extremity. Access Type: Basilic vein transposition. Limitations: Overlying edema. Performing Technologist: Sherren Kerns RVS  Examination Guidelines: A complete evaluation includes B-mode imaging, spectral Doppler, color Doppler, and power Doppler as needed of all accessible portions of each vessel. Unilateral testing is considered an integral part of a complete examination. Limited examinations for reoccurring indications may be performed as noted.  Findings:  +------------+----------+-------------+----------+------------------------+ OUTFLOW VEINPSV (cm/s)Diameter (cm)Depth (cm)        Describe         +------------+----------+-------------+----------+------------------------+ Shoulder       639                           fistula at axillary area +------------+----------+-------------+----------+------------------------+    Summary: High grade stenosis noted near the axilla possibly created by a large hematoma noted in same area.   Diagnosing physician: Coral Else MD Electronically signed by Coral Else MD on 10/08/2022 at 11:25:23 AM.   --------------------------------------------------------------------------------   Final    MR BRAIN WO CONTRAST  Result Date: 10/07/2022 CLINICAL DATA:  TIA.  Altered mental status EXAM: MRI HEAD WITHOUT CONTRAST TECHNIQUE: Multiplanar, multiecho pulse sequences of the brain and surrounding structures were obtained without intravenous contrast. COMPARISON:  Head CT from yesterday FINDINGS: Brain: No acute infarction, hemorrhage, hydrocephalus, extra-axial collection or mass lesion. Mild chronic small vessel ischemic type change in the cerebral white matter. Age normal brain volume Vascular: Normal flow voids. Skull and upper cervical spine: Normal marrow signal. Sinuses/Orbits: Bilateral cataract resection.  No pathologic finding IMPRESSION: No acute finding.  Unremarkable MRI of the brain for age. Electronically Signed   By: Tiburcio Pea M.D.   On: 10/07/2022 04:46   CT Head Wo Contrast  Result Date: 10/06/2022 CLINICAL DATA:  Mental status change EXAM: CT HEAD WITHOUT CONTRAST TECHNIQUE: Contiguous axial images were obtained from the base of the skull through the vertex without intravenous contrast. RADIATION DOSE REDUCTION: This exam was performed according to the departmental dose-optimization program which includes automated exposure control, adjustment of the mA and/or kV according to patient size and/or use of iterative reconstruction technique. COMPARISON:  CT of the head 09/21/2022 FINDINGS: Brain: No evidence of acute infarction, hemorrhage, hydrocephalus, extra-axial collection or mass lesion/mass effect. Again seen is mild diffuse atrophy. There is stable mild periventricular white matter hypodensity, likely chronic small vessel ischemic change. There is an old lacunar  infarct in the left basal ganglia which is new from prior. Vascular: No hyperdense vessel or unexpected calcification. Skull: Normal. Negative for fracture or focal lesion. Sinuses/Orbits: No acute finding. Other: None. IMPRESSION: 1. No acute intracranial process. 2. Stable mild diffuse atrophy and chronic small vessel ischemic change. 3. Old lacunar infarct in the left basal ganglia, new from prior. Electronically Signed   By: Darliss Cheney M.D.   On: 10/06/2022 22:48   DG Chest 2 View  Result Date: 10/06/2022 CLINICAL DATA:  Altered mental status EXAM: CHEST - 2 VIEW COMPARISON:  01/15/2008 FINDINGS: Right lung is grossly clear. Small left-sided pleural effusion with mild airspace disease at left base. Mild cardiomegaly. No pneumothorax. IMPRESSION: Small left pleural effusion with mild airspace disease at the left base which may be due to atelectasis or pneumonia. Electronically Signed   By: Jasmine Pang M.D.   On: 10/06/2022 22:29  VAS US DUPLEX DIALYSIS ACCESS (AVF, AVG)  Result Date: 09/25/2022 DIALYSIS ACCESS Patient Name:  JOSELYNN AMOROSO CLEVE  Date of Exam:   09/25/2022 Medical Rec #: [id number]            Accession #:    [id number] Date of Birth: 08-07-1940           Patient Gender: F Patient Age:   28 years Exam Location:  [facility] Procedure:      VAS US DUPLEX DIALYSIS ACCESS (AVF, AVG) Referring Phys: EMMA COLLINS --------------------------------------------------------------------------------  Reason for Exam: Routine follow up. Access Site: Left Upper Extremity. Access Type: Basilic vein transposition. History: 08/06/22 Left brachial basilc AVF creation by Dr Chestine Spore. Performing Technologist: Lowell Guitar RVT, RDMS  Examination Guidelines: A complete evaluation includes B-mode imaging, spectral Doppler, color Doppler, and power Doppler as needed of all accessible portions of each vessel. Unilateral testing is considered an integral part of a complete examination. Limited  examinations for reoccurring indications may be performed as noted.  Findings: +-------------------+----------+----------------+------------------------------+ AVF                PSV (cm/s)    Flow Vol               Comments                                             (mL/min)                                   +-------------------+----------+----------------+------------------------------+ Native artery         323          1717                                     inflow                                                                      +-------------------+----------+----------------+------------------------------+ AVF Anastomosis       1000                       PSV exceeds measurable                                                             velocity            +-------------------+----------+----------------+------------------------------+  +------------+----------+-------------+----------+--------+ OUTFLOW VEINPSV (cm/s)Diameter (cm)Depth (cm)Describe +------------+----------+-------------+----------+--------+ Prox UA        107        0.70        0.62            +------------+----------+-------------+----------+--------+ Mid UA         174        0.74  0.74            +------------+----------+-------------+----------+--------+ Dist UA        359        0.52        0.36            +------------+----------+-------------+----------+--------+   Summary: Patent Left Brachial Basilic AVF  *See table(s) above for measurements and observations.  Diagnosing physician: Sherald Hess MD Electronically signed by Sherald Hess MD on 09/25/2022 at 4:28:12 PM.    --------------------------------------------------------------------------------   Final    CT HEAD WO CONTRAST ( )  Result Date: 09/21/2022 CLINICAL DATA:  Fall EXAM: CT HEAD WITHOUT CONTRAST TECHNIQUE: Contiguous axial images were obtained from the base of the skull through the vertex  without intravenous contrast. RADIATION DOSE REDUCTION: This exam was performed according to the departmental dose-optimization program which includes automated exposure control, adjustment of the mA and/or kV according to patient size and/or use of iterative reconstruction technique. COMPARISON:  CT head 05/30/2022 FINDINGS: Brain: There is no acute intracranial hemorrhage, extra-axial fluid collection, or acute infarct. Parenchymal volume is normal. The ventricles are normal in size. Gray-white differentiation is preserved. The pituitary and suprasellar region are normal. There is no mass lesion. There is no mass effect or midline shift. Vascular: No hyperdense vessel or unexpected calcification. Skull: Normal. Negative for fracture or focal lesion. Sinuses/Orbits: The imaged paranasal sinuses are clear. Bilateral lens implants are in place. The globes and orbits are otherwise unremarkable. Other: None. IMPRESSION: Unremarkable for age head CT with no acute intracranial pathology. Electronically Signed   By: [name] M.D.   On: 09/21/2022 14:21    Labs: BNP (last 3 results) Recent Labs    10/07/22 0956  BNP 353.2*   Basic Metabolic Panel: Recent Labs  Lab 10/05/22 0634 10/06/22 2209 10/07/22 0956 10/08/22 0301 10/09/22 0345  NA 138 131* 133* 133* 138  K 5.4* 4.3 4.5 4.4 4.2  CL 103 96* 101 100 100  CO2  --  23 22 25 27   GLUCOSE 92 112* 80 83 81  BUN 67* 64* 58* 54* 45*  CREATININE 2.70* 3.13* 2.89* 2.84* 2.76*  CALCIUM  --  8.4* 8.2* 8.3* 8.5*  MG  --   --   --  2.6*  --   PHOS  --   --  3.5 4.1 3.9   Liver Function Tests: Recent Labs  Lab 10/06/22 2209 10/07/22 0956 10/08/22 0301 10/09/22 0345  AST 75*  --   --  33  ALT 19  --   --  12  ALKPHOS 51  --   --  58  BILITOT 0.7  --   --  0.6  PROT 5.9*  --   --  5.6*  ALBUMIN 2.7* 2.4* 2.3* 2.4*   No results for input(s): "LIPASE", "AMYLASE" in the last 168 hours. No results for input(s): "AMMONIA" in the last 168  hours. CBC: Recent Labs  Lab 10/06/22 2209 10/07/22 0956 10/07/22 1749 10/08/22 0301 10/09/22 0345  WBC 21.9* 17.3*  --  14.6* 9.8  NEUTROABS 19.3* 15.4*  --   --   --   HGB 8.5* 7.6* 8.9* 8.4* 9.2*  HCT 26.0* 23.9* 26.8* 26.0* 27.8*  MCV 90.6 93.0  --  86.7 86.1  PLT 271 236  --  218 231   Cardiac Enzymes: No results for input(s): "CKTOTAL", "CKMB", "CKMBINDEX", "TROPONINI" in the last 168 hours. BNP: Invalid input(s): "POCBNP" CBG: Recent Labs  Lab 10/07/22 0536 10/07/22 0647 10/08/22  1723 10/09/22 0019  GLUCAP 68* 86 76 81   D-Dimer No results for input(s): "DDIMER" in the last 72 hours. Hgb A1c No results for input(s): "HGBA1C" in the last 72 hours. Lipid Profile No results for input(s): "CHOL", "HDL", "LDLCALC", "TRIG", "CHOLHDL", "LDLDIRECT" in the last 72 hours. Thyroid function studies Recent Labs    10/07/22 0956  TSH 11.648*   Anemia work up No results for input(s): "VITAMINB12", "FOLATE", "FERRITIN", "TIBC", "IRON", "RETICCTPCT" in the last 72 hours. Urinalysis    Component Value Date/Time   COLORURINE YELLOW 10/07/2022 0521   APPEARANCEUR HAZY (A) 10/07/2022 0521   APPEARANCEUR Turbid (A) 11/30/2019 1518   LABSPEC 1.005 10/07/2022 0521   PHURINE 7.0 10/07/2022 0521   GLUCOSEU NEGATIVE 10/07/2022 0521   HGBUR MODERATE (A) 10/07/2022 0521   BILIRUBINUR NEGATIVE 10/07/2022 0521   BILIRUBINUR Negative 11/30/2019 1518   KETONESUR NEGATIVE 10/07/2022 0521   PROTEINUR 30 (A) 10/07/2022 0521   UROBILINOGEN 0.2 01/14/2008 1327   NITRITE NEGATIVE 10/07/2022 0521   LEUKOCYTESUR LARGE (A) 10/07/2022 0521   Sepsis Labs Recent Labs  Lab 10/06/22 2209 10/07/22 0956 10/08/22 0301 10/09/22 0345  WBC 21.9* 17.3* 14.6* 9.8   Microbiology Recent Results (from the past 240 hour(s))  Blood culture (routine x 2)     Status: None (Preliminary result)   Collection Time: 10/07/22  1:38 AM   Specimen: BLOOD LEFT ARM  Result Value Ref Range Status    Specimen Description BLOOD LEFT ARM  Final   Special Requests   Final    BOTTLES DRAWN AEROBIC AND ANAEROBIC Blood Culture results may not be optimal due to an excessive volume of blood received in culture bottles   Culture   Final    NO GROWTH 2 DAYS Performed at [facility], [address] [place], [place] [address]    Report Status PENDING  Incomplete  Blood culture (routine x 2)     Status: None (Preliminary result)   Collection Time: 10/07/22  1:38 AM   Specimen: BLOOD LEFT HAND  Result Value Ref Range Status   Specimen Description BLOOD LEFT HAND  Final   Special Requests   Final    BOTTLES DRAWN AEROBIC AND ANAEROBIC Blood Culture adequate volume   Culture   Final    NO GROWTH 2 DAYS Performed at [facility], [address], [place], [place] [address]    Report Status PENDING  Incomplete  Urine Culture (for pregnant, neutropenic or urologic patients or patients with an indwelling urinary catheter)     Status: Abnormal   Collection Time: 10/07/22  5:38 AM   Specimen: Urine, Clean Catch  Result Value Ref Range Status   Specimen Description URINE, CLEAN CATCH  Final   Special Requests   Final    NONE Performed at [facility], [address] [place], [place] [address]    Culture (A)  Final    10,000 COLONIES/mL MULTIPLE SPECIES PRESENT, SUGGEST RECOLLECTION   Report Status 10/08/2022 FINAL  Final  Respiratory (~20 pathogens) panel by PCR     Status: None   Collection Time: 10/07/22 10:50 AM   Specimen: Nasopharyngeal Swab; Respiratory  Result Value Ref Range Status   Adenovirus NOT DETECTED NOT DETECTED Final   Coronavirus 229E NOT DETECTED NOT DETECTED Final    Comment: (NOTE) The Coronavirus on the Respiratory Panel, DOES NOT test for the novel  Coronavirus (2019 nCoV)    Coronavirus HKU1 NOT DETECTED NOT DETECTED Final  Coronavirus NL63 NOT DETECTED NOT DETECTED Final   Coronavirus OC43 NOT DETECTED NOT DETECTED Final   Metapneumovirus NOT  DETECTED NOT DETECTED Final   Rhinovirus / Enterovirus NOT DETECTED NOT DETECTED Final   Influenza A NOT DETECTED NOT DETECTED Final   Influenza B NOT DETECTED NOT DETECTED Final   Parainfluenza Virus 1 NOT DETECTED NOT DETECTED Final   Parainfluenza Virus 2 NOT DETECTED NOT DETECTED Final   Parainfluenza Virus 3 NOT DETECTED NOT DETECTED Final   Parainfluenza Virus 4 NOT DETECTED NOT DETECTED Final   Respiratory Syncytial Virus NOT DETECTED NOT DETECTED Final   Bordetella pertussis NOT DETECTED NOT DETECTED Final   Bordetella Parapertussis NOT DETECTED NOT DETECTED Final   Chlamydophila pneumoniae NOT DETECTED NOT DETECTED Final   Mycoplasma pneumoniae NOT DETECTED NOT DETECTED Final    Comment: Performed at [facility], [address], [place], [place] [address]  SARS Coronavirus 2 by RT PCR (hospital order, performed in [facility] hospital lab) *cepheid single result test*     Status: None   Collection Time: 10/07/22 10:50 AM  Result Value Ref Range Status   SARS Coronavirus 2 by RT PCR NEGATIVE NEGATIVE Final    Comment: Performed at [facility], [address] [place], [place] [address]   Time coordinating discharge: 35 minutes  SIGNED: Lanae Boast, MD  [facility] 10/09/2022, 10:36 AM  If 7PM-7AM, please contact night-coverage [email or web address]

## 2022-10-09 NOTE — TOC Transition Note (Signed)
Transition of Care [facility]) - CM/SW Discharge Note   Patient Details  Name: Linda Rice MRN: [id number] Date of Birth: 1941/03/15  [facility]) CM/SW Contact:  Janae Bridgeman, RN Phone Number: 10/09/2022, 10:21 AM   Clinical Narrative:    CM met with the patient at the bedside to discuss TOC needs.  The patient lives at home alone in the [facility] at [place].  I called Whitney, CM at [facility] and she will set the patient up for home health through their therapy service called [facility].  Home Health orders are active for home health PT/OT.  The daughter plans to transport the patient home by care today to her apartment at [facility].   Final next level of care: Home w Home Health Services Barriers to Discharge: No Barriers Identified   Patient Goals and CMS Choice CMS Medicare.gov Compare Post Acute Care list provided to:: Patient Choice offered to / list presented to : Patient  Discharge Placement                         Discharge Plan and Services Additional resources added to the After Visit Summary for     Discharge Planning Services: CM Consult Post Acute Care Choice: Home Health                    HH Arranged: PT, OT [facility] Agency:  (Home Health PT/OT to be provided through [facility] therapy services at [facility]) Date [facility] Agency Contacted: 10/09/22 Time HH Agency Contacted: 1017 Representative spoke with at [facility] Agency: spoke with Alphonzo Lemmings, CM at [facility] and she will set up therapy  Social Determinants of Health (SDOH) Interventions SDOH Screenings   Food Insecurity: No Food Insecurity (10/07/2022)  Housing: Low Risk  (10/07/2022)  Transportation Needs: No Transportation Needs (10/07/2022)  Utilities: Not At Risk (10/07/2022)  Depression (PHQ2-9): Medium Risk (04/13/2019)  Tobacco Use: Medium Risk (10/07/2022)     Readmission Risk Interventions    10/09/2022   10:19 AM   Readmission Risk Prevention Plan  Transportation Screening Complete  PCP or Specialist Appt within 5-7 Days Complete  Home Care Screening Complete  Medication Review (RN CM) Complete

## 2022-10-09 NOTE — Progress Notes (Signed)
Pharmacy Antibiotic Note  Linda Rice is a 82 y.o. female admitted on 10/06/2022 with pneumonia.  Pharmacy has been consulted for amoxicillin/clavulaunate dosing.  ClCr 15 ml/min. Per MD, will treat for 5 days. pAtient received 2 days of ceftriaxone.   Plan: Stop ceftriaxone Start amox/clav 500mg -125mg  daily for 3 more days   Height: 5\' 3"  (160 cm) Weight: 67 kg (147 lb 11.3 oz) IBW/kg (Calculated) : 52.4  Temp (24hrs), Avg:97.9 F (36.6 C), Min:97.7 F (36.5 C), Max:98 F (36.7 C)  Recent Labs  Lab 10/05/22 0634 10/06/22 2209 10/07/22 0138 10/07/22 0956 10/08/22 0301 10/09/22 0345  WBC  --  21.9*  --  17.3* 14.6* 9.8  CREATININE 2.70* 3.13*  --  2.89* 2.84* 2.76*  LATICACIDVEN  --   --  1.2  --   --   --     Estimated Creatinine Clearance: 14.9 mL/min (A) (by C-G formula based on SCr of 2.76 mg/dL (H)).    Allergies  Allergen Reactions   Egg-Derived Products Hives    eczema Poultry Rash and GI symptoms   Erythromycin Other (See Comments)    REACTION: stomach symptoms  and hives   Fish Allergy Other (See Comments) and Swelling    Lip swelling and eczema With fins hives    Gatifloxacin Hives, Nausea Only and Swelling   Other Anaphylaxis, Itching and Swelling    Legumes- all beans and peas and tree nuts cause lip swelling and eczema  Orange Vegetables cause eczema    Peanut-Containing Drug Products Anaphylaxis    And tree nuts    Poultry Meal Diarrhea and Itching   Banana Itching   Keflex [Cephalexin] Rash   Latex Itching     Thank you for allowing pharmacy to be a part of this patient's care.  Alphia Moh, PharmD, BCPS, BCCP Clinical Pharmacist  Please check AMION for all [facility] Pharm[facility] phone numbers After 10:00 PM, call Main Pharmacy [phone]

## 2022-10-11 ENCOUNTER — Telehealth: Payer: Self-pay | Admitting: Physician Assistant

## 2022-10-11 LAB — CULTURE, BLOOD (ROUTINE X 2): Special Requests: ADEQUATE

## 2022-10-11 LAB — TYPE AND SCREEN
ABO/RH(D): A POS
Antibody Screen: NEGATIVE
Donor AG Type: NEGATIVE
Unit division: 0

## 2022-10-11 LAB — BPAM RBC
Blood Product Expiration Date: 202405232359
ISSUE DATE / TIME: 202404281229
Unit Type and Rh: 6200
Unit Type and Rh: 6200

## 2022-10-11 NOTE — Telephone Encounter (Signed)
Appt has been scheduled.

## 2022-10-11 NOTE — Telephone Encounter (Signed)
-----   Message from Emilie Rutter, PA-C sent at 10/09/2022  8:18 AM EDT -----  2 weeks with PA for incision check on [facility] office day.  PO 2nd stage basilic with post op hematoma Thanks

## 2022-10-12 ENCOUNTER — Telehealth: Payer: Self-pay

## 2022-10-12 DIAGNOSIS — I491 Atrial premature depolarization: Secondary | ICD-10-CM | POA: Diagnosis not present

## 2022-10-12 LAB — CULTURE, BLOOD (ROUTINE X 2): Culture: NO GROWTH

## 2022-10-12 NOTE — Telephone Encounter (Signed)
Pt's daughter, Trula Ore, called stating the pt's arm swelling and bruising have increased over the past couple of days.  Reviewed pt's chart, returned call for clarification, two identifiers used. Pt stated that she was instructed to remove the wrap on Wednesday and that's when the swelling gradually worsened. She has not been elevating much. Instructed her to elevate with her hand and elbow above her heart during the day and night with pillows. If that didn't improve over the next day, she could try a light ACE wrap. Pt will continue to monitor and call back if no change or worsening. Confirmed understanding.

## 2022-10-16 DIAGNOSIS — R627 Adult failure to thrive: Secondary | ICD-10-CM | POA: Diagnosis not present

## 2022-10-16 DIAGNOSIS — R509 Fever, unspecified: Secondary | ICD-10-CM | POA: Diagnosis not present

## 2022-10-16 DIAGNOSIS — G9341 Metabolic encephalopathy: Secondary | ICD-10-CM | POA: Diagnosis not present

## 2022-10-16 DIAGNOSIS — N184 Chronic kidney disease, stage 4 (severe): Secondary | ICD-10-CM | POA: Diagnosis not present

## 2022-10-16 DIAGNOSIS — E878 Other disorders of electrolyte and fluid balance, not elsewhere classified: Secondary | ICD-10-CM | POA: Diagnosis not present

## 2022-10-22 DIAGNOSIS — N184 Chronic kidney disease, stage 4 (severe): Secondary | ICD-10-CM | POA: Diagnosis not present

## 2022-10-25 ENCOUNTER — Encounter: Payer: Self-pay | Admitting: Cardiology

## 2022-10-25 ENCOUNTER — Ambulatory Visit: Payer: Medicare Other | Admitting: Cardiology

## 2022-10-25 VITALS — BP 104/64 | HR 73 | Ht 63.0 in | Wt 148.0 lb

## 2022-10-25 DIAGNOSIS — I4719 Other supraventricular tachycardia: Secondary | ICD-10-CM

## 2022-10-25 DIAGNOSIS — I10 Essential (primary) hypertension: Secondary | ICD-10-CM

## 2022-10-25 DIAGNOSIS — I491 Atrial premature depolarization: Secondary | ICD-10-CM | POA: Diagnosis not present

## 2022-10-25 MED ORDER — METOPROLOL TARTRATE 25 MG PO TABS
25.0000 mg | ORAL_TABLET | Freq: Two times a day (BID) | ORAL | 0 refills | Status: DC
Start: 2022-10-25 — End: 2023-09-16

## 2022-10-25 NOTE — Progress Notes (Signed)
Patient referred by Zoila Shutter, MD for irregular heart rhythm  Subjective:   Linda Rice, female    DOB: 07/28/1940, 82 y.o.   MRN: [id number]   Chief Complaint  Patient presents with   PAC (premature atrial contraction)   Follow-up     HPI  82 y.o. Caucasian female with hypertension, hyperlipidemia, asthma, hypothyroidism, frequent PACs  Patient was hospitalized in 09/2022.  It appears that she had dialysis access surgery on 426.  Following this, she has significant pain and soreness in the left arm and for which she was taking multiple Tylenol's.  She has been very lethargic since then.  She presented to the ER where she was found to have leukocytosis with WBC count 21 K.  Chest x-ray noted a small left-sided pleural effusion and mild airspace disease in left lung base concerning for atelectasis or pneumonia.  While CT scan of the head showed no acute abnormality but an infarct in the left basal ganglia new from prior study, MRI did not show any infarct.  Therefore, CT head finding was deemed to be false positive..  Patient was treated with IV fluids as well as 1 g of Rocephin.  She was also found to have acute blood loss anemia and significant bruising in the left upper arm.  She needed 1 unit of PRBC transfusion with improvement in hemoglobin.  She was discharged with OT, PT, and home health recommendations.  Patient continues to feel "not wonderful". She is going to undergo IV infusion with nephrology.   Office visit 09/2022: Patient is here with her daughter today.  She was recently visiting her sister in [place].  While there, she had an episode of tingling in both hands, dizziness, palpitations.  She went to a hospital there, where she was found to be A-fib.  Of note, her magnesium was very low at 0.15, hemoglobin ranged between 7-9 g/dL.  She was hospitalized for 2-3 days.  Anticoagulation was discussed, but patient was unsure.  She was discharged, reportedly was  still in A-fib.  2 days later, patient was walking on the sidewalk, felt dizzy, and had a fall.  She had back of her head with significant bruising.  She was again taken to the hospital, and underwent laceration repair on the back of her scalp.  Fortunately, she is not on blood thinners.  Therefore, no imaging was performed.  Patient is now back in the [place].  She denies any nausea, vomiting, vision changes, focal weakness, but continues to have severe headache.  He remains unsure of being on anticoagulation.  Patient has recently had progressively worsening CKD, for which she is seeing Dr. Thedore Mins.  They are contemplating placement of AV fistula/graft or vascular surgery.  Blood pressure is better controlled with addition of metoprolol, heart irate is lower too.    Current Outpatient Medications:    albuterol (VENTOLIN HFA) 108 (90 Base) MCG/ACT inhaler, Inhale 1-2 puffs into the lungs every 6 (six) hours as needed for wheezing or shortness of breath., Disp: 18 g, Rfl: 1   atorvastatin (LIPITOR) 10 MG tablet, Take 10 mg by mouth in the morning., Disp: , Rfl:    Calcium Carb-Cholecalciferol (CALCIUM 500 + D PO), Take 1 tablet by mouth daily., Disp: , Rfl:    cetirizine (ZYRTEC) 10 MG tablet, Take 10 mg by mouth at bedtime., Disp: , Rfl:    Darbepoetin Alfa (ARANESP) 40 MCG/0.4ML SOSY injection, Inject 40 mcg into the skin every 28 (twenty-eight) days.,  Disp: , Rfl:    doxycycline (VIBRAMYCIN) 100 MG capsule, Take 100 mg by mouth 2 (two) times daily., Disp: , Rfl: 4   EPINEPHrine 0.3 mg/0.3 mL IJ SOAJ injection, Inject 0.3 mLs (0.3 mg total) into the muscle as needed for anaphylaxis., Disp: 2 each, Rfl: 1   furosemide (LASIX) 40 MG tablet, Take 40 mg by mouth daily., Disp: , Rfl:    HYDROcodone-acetaminophen (NORCO) 5-325 MG tablet, Take 1 tablet by mouth every 6 (six) hours as needed for moderate pain. (Patient not taking: Reported on 10/07/2022), Disp: 16 tablet, Rfl: 0   Levothyroxine Sodium 88 MCG  CAPS, Take 88 mcg by mouth daily before breakfast., Disp: , Rfl:    Magnesium 500 MG TABS, Take 500 mg by mouth daily., Disp: , Rfl:    metoprolol tartrate (LOPRESSOR) 25 MG tablet, Take 1 tablet (25 mg total) by mouth in the morning, at noon, and at bedtime., Disp: 1 tablet, Rfl: 0   mirtazapine (REMERON) 30 MG tablet, Take 30 mg by mouth at bedtime., Disp: , Rfl:    Multiple Vitamins-Minerals (MULTIVITAMIN WITH MINERALS) tablet, Take 1 tablet by mouth daily. Woman, Disp: , Rfl:    sodium bicarbonate 650 MG tablet, Take 1,300 mg by mouth 2 (two) times daily., Disp: , Rfl:   Cardiovascular studies:  EKG 09/21/2022: Sinus rhythm  Frequent PACs   Mobile cardiac telemetry 13 days 09/21/2022 - 10/04/2022: Dominant rhythm: Sinus. HR 48-103 bpm. Avg HR 68 bpm, in sinus rhythm. 687 episodes of probable atrial tachycardia, fastest at 167 bpm for 4 beats, longest for 23.9 secs at 104 bpm. 10% isolated SVE, 4.1% couplets, 1.6% triplets. 1 episode of VT, fastest at 111 bpm for 4 beats. <1% isolated VE,  couplet/triplets. No atrial fibrillation/atrial flutter/high grade AV block, sinus pause >3sec noted. 0 patient triggered events.   Echocardiogram 07/19/2022: Normal LV systolic function with visual EF 55-60%. Left ventricle cavity is normal in size. Normal left ventricular wall thickness. Normal global wall motion. Doppler evidence of grade I (impaired) diastolic dysfunction, normal LAP. Calculated EF 51%. Left atrial cavity is moderately dilated at 37.9 ml/m^2. Trileaflet aortic valve.  Mild (Grade I) aortic regurgitation. Sclerosis of the aortic valve. Structurally normal mitral valve.  Mild (Grade I) mitral regurgitation. Structurally normal tricuspid valve.  Mild tricuspid regurgitation. No evidence of pulmonary hypertension. No significant change compared to 06/2020.  CT head 10/06/2022: 1. No acute intracranial process. 2. Stable mild diffuse atrophy and chronic small vessel  ischemic change. 3. Old lacunar infarct in the left basal ganglia, new from prior.   Recent labs: 10/09/2022: Glucose 81, BUN/Cr 45/2.76. EGFR 17. Na/K 138/4.2. Albumin 2.4. Protein 5.6. Rest of the CMP normal H/H 9/27. MCV 86. Platelets 231 TSH 11.6 normal   Review of Systems  Cardiovascular:  Negative for chest pain, dyspnea on exertion, leg swelling, palpitations and syncope.  Neurological:  Positive for dizziness and headaches. Negative for focal weakness.         Vitals:   10/25/22 1512  BP: 104/64  Pulse: 73  SpO2: 97%    Body mass index is 26.22 kg/m. Filed Weights   10/25/22 1512  Weight: 148 lb (67.1 kg)     Objective:   Physical Exam Vitals and nursing note reviewed.  Constitutional:      General: She is not in acute distress.    Appearance: She is well-developed.  HENT:     Head:     Comments: Ecchymosis in back of scalp Neck:  Vascular: No JVD.  Cardiovascular:     Rate and Rhythm: Regular rhythm. Tachycardia present. Frequent Extrasystoles are present.    Pulses: Intact distal pulses.     Heart sounds: Normal heart sounds. No murmur heard. Pulmonary:     Effort: Pulmonary effort is normal.     Breath sounds: Normal breath sounds. No wheezing or rales.  Musculoskeletal:     Right lower leg: No edema.     Left lower leg: No edema.    Visit diagnoses:    ICD-10-CM   1. PAC (premature atrial contraction)  I49.1     2. Multifocal atrial tachycardia  I47.19 metoprolol tartrate (LOPRESSOR) 25 MG tablet    3. Primary hypertension  I10      Meds ordered this encounter  Medications   metoprolol tartrate (LOPRESSOR) 25 MG tablet    Sig: Take 1 tablet (25 mg total) by mouth 2 (two) times daily.    Dispense:  1 tablet    Refill:  0         Assessment & Recommendations:    82 y.o. Caucasian female with hypertension, hyperlipidemia, asthma, hypothyroidism, frequent PACs/MAT, CKD IV  Frequent PACs/MAT: Longstanding, asymptomatic  issue. No A-fib has been documented on EKG with me, she reportedly had an episode of A-fib while in [place] that required hospitalization.  EKG from [place] is not available for my review.  Nonetheless, I have to assume that she did indeed have an episode of A-fib, albeit in the setting of severe hypomagnesemia. Now she is on magnesium supplement. No Afib noted on 2 week cardiac telemetry.  Her CHA2DS2VASc score  is high, and she would ideally require anticoagulation for stroke prevention.  However, given her anemia of CKD, frequent falls, and recent head trauma, we had mutually decided to hold off anticoagulation.    Continue metoprolol tartrate 25 mg, probably bid given low normal blood pressures.  Generalized fatigue likely due to anemia. Continue management as per nephrology.   Hypertension: Well controlled.  CKD: Continue follow-up with nephrology.    F/u in 6 weeks    Elder Negus, MD Pager: [phone] Office: [phone]

## 2022-10-26 DIAGNOSIS — M8668 Other chronic osteomyelitis, other site: Secondary | ICD-10-CM | POA: Diagnosis not present

## 2022-10-26 DIAGNOSIS — R627 Adult failure to thrive: Secondary | ICD-10-CM | POA: Diagnosis not present

## 2022-10-26 DIAGNOSIS — G9341 Metabolic encephalopathy: Secondary | ICD-10-CM | POA: Diagnosis not present

## 2022-10-26 DIAGNOSIS — I129 Hypertensive chronic kidney disease with stage 1 through stage 4 chronic kidney disease, or unspecified chronic kidney disease: Secondary | ICD-10-CM | POA: Diagnosis not present

## 2022-10-26 DIAGNOSIS — N184 Chronic kidney disease, stage 4 (severe): Secondary | ICD-10-CM | POA: Diagnosis not present

## 2022-10-26 DIAGNOSIS — R202 Paresthesia of skin: Secondary | ICD-10-CM | POA: Diagnosis not present

## 2022-10-26 DIAGNOSIS — R634 Abnormal weight loss: Secondary | ICD-10-CM | POA: Diagnosis not present

## 2022-10-26 DIAGNOSIS — D649 Anemia, unspecified: Secondary | ICD-10-CM | POA: Diagnosis not present

## 2022-10-26 DIAGNOSIS — E878 Other disorders of electrolyte and fluid balance, not elsewhere classified: Secondary | ICD-10-CM | POA: Diagnosis not present

## 2022-10-29 ENCOUNTER — Other Ambulatory Visit (HOSPITAL_COMMUNITY): Payer: Self-pay | Admitting: *Deleted

## 2022-10-30 ENCOUNTER — Ambulatory Visit (INDEPENDENT_AMBULATORY_CARE_PROVIDER_SITE_OTHER): Payer: Medicare Other | Admitting: Physician Assistant

## 2022-10-30 ENCOUNTER — Encounter: Payer: Self-pay | Admitting: Physician Assistant

## 2022-10-30 ENCOUNTER — Ambulatory Visit (HOSPITAL_COMMUNITY)
Admission: RE | Admit: 2022-10-30 | Discharge: 2022-10-30 | Disposition: A | Discharge status: Home / Self Care | Payer: Medicare Other | Source: Ambulatory Visit | Attending: Nephrology | Admitting: Nephrology

## 2022-10-30 VITALS — BP 104/67 | HR 63 | Temp 98.0°F | Wt 145.0 lb

## 2022-10-30 VITALS — BP 100/63 | HR 60 | Temp 97.7°F | Resp 18

## 2022-10-30 DIAGNOSIS — D631 Anemia in chronic kidney disease: Secondary | ICD-10-CM | POA: Insufficient documentation

## 2022-10-30 DIAGNOSIS — N184 Chronic kidney disease, stage 4 (severe): Secondary | ICD-10-CM | POA: Insufficient documentation

## 2022-10-30 LAB — POCT HEMOGLOBIN-HEMACUE: Hemoglobin: 8.3 g/dL — ABNORMAL LOW (ref 12.0–15.0)

## 2022-10-30 MED ORDER — DARBEPOETIN ALFA 60 MCG/0.3ML IJ SOSY: 60.0000 ug | PREFILLED_SYRINGE | INTRAMUSCULAR | Status: DC

## 2022-10-30 MED ORDER — SODIUM CHLORIDE 0.9 % IV SOLN
200.0000 mg | INTRAVENOUS | Status: DC
  Administered 2022-10-30: 200 mg via INTRAVENOUS
  Filled 2022-10-30: qty 200

## 2022-10-30 MED ORDER — DARBEPOETIN ALFA 60 MCG/0.3ML IJ SOSY
PREFILLED_SYRINGE | INTRAMUSCULAR | Status: AC
  Administered 2022-10-30: 60 ug via SUBCUTANEOUS
  Filled 2022-10-30: qty 0.3

## 2022-10-30 NOTE — Progress Notes (Signed)
POST OPERATIVE OFFICE NOTE    CC:  F/u for surgery  HPI:  This is a 82 y.o. female who is s/p left 2nd stage BVT on 10/05/2022 by Dr. Chestine Spore.  Her 1st stage was created on 08/06/2022 also by Dr. Chestine Spore.   Pt states she does have some numbness in the left hand.   She states that she did have some nerve studies done on the arm.  She states that she still has some swelling of the left arm but it seems to be better.    She is not yet on HD as she says her GFR is 21%.  Dr. Thedore Mins is her nephrologist.  If she ever has to go on HD, she will be at the center on [facility] in [facility].     Allergies  Allergen Reactions   Egg-Derived Products Hives    eczema Poultry Rash and GI symptoms   Erythromycin Other (See Comments)    REACTION: stomach symptoms  and hives   Fish Allergy Other (See Comments) and Swelling    Lip swelling and eczema With fins hives    Gatifloxacin Hives, Nausea Only and Swelling   Other Anaphylaxis, Itching and Swelling    Legumes- all beans and peas and tree nuts cause lip swelling and eczema  Orange Vegetables cause eczema    Peanut-Containing Drug Products Anaphylaxis    And tree nuts    Poultry Meal Diarrhea and Itching   Banana Itching   Keflex [Cephalexin] Rash   Latex Itching    Current Outpatient Medications  Medication Sig Dispense Refill   albuterol (VENTOLIN HFA) 108 (90 Base) MCG/ACT inhaler Inhale 1-2 puffs into the lungs every 6 (six) hours as needed for wheezing or shortness of breath. 18 g 1   atorvastatin (LIPITOR) 10 MG tablet Take 10 mg by mouth in the morning.     Calcium Carb-Cholecalciferol (CALCIUM 500 + D PO) Take 1 tablet by mouth daily.     cetirizine (ZYRTEC) 10 MG tablet Take 10 mg by mouth at bedtime.     Darbepoetin Alfa (ARANESP) 40 MCG/0.4ML SOSY injection Inject 40 mcg into the skin every 28 (twenty-eight) days.     doxycycline (VIBRAMYCIN) 100 MG capsule Take 100 mg by mouth 2 (two) times daily.  4   EPINEPHrine 0.3 mg/0.3 mL  IJ SOAJ injection Inject 0.3 mLs (0.3 mg total) into the muscle as needed for anaphylaxis. 2 each 1   furosemide (LASIX) 40 MG tablet Take 40 mg by mouth daily.     HYDROcodone-acetaminophen (NORCO) 5-325 MG tablet Take 1 tablet by mouth every 6 (six) hours as needed for moderate pain. 16 tablet 0   Levothyroxine Sodium 88 MCG CAPS Take 88 mcg by mouth daily before breakfast.     Magnesium 500 MG TABS Take 500 mg by mouth daily.     metoprolol tartrate (LOPRESSOR) 25 MG tablet Take 1 tablet (25 mg total) by mouth 2 (two) times daily. 1 tablet 0   mirtazapine (REMERON) 30 MG tablet Take 30 mg by mouth at bedtime.     Multiple Vitamins-Minerals (MULTIVITAMIN WITH MINERALS) tablet Take 1 tablet by mouth daily. Woman     sodium bicarbonate 650 MG tablet Take 1,300 mg by mouth 2 (two) times daily.     No current facility-administered medications for this visit.     ROS:  See HPI  Physical Exam:  Today's Vitals   10/30/22 1403  BP: 104/67  Pulse: 63  Temp: 98 F (36.7  C)  TempSrc: Temporal  SpO2: 95%  Weight: 145 lb (65.8 kg)   Body mass index is 25.69 kg/m.   Incision:  all incisions have healed nicely. Extremities:   There is a palpable left radial pulse.   Motor and sensory are in tact.   There is a thrill/bruit present.  Access is  easily palpable     Assessment/Plan:  This is a 82 y.o. female who is s/p: left 2nd stage BVT on 10/05/2022 by Dr. Chestine Spore.  Her 1st stage was created on 08/06/2022 also by Dr. Chestine Spore.   -the pt does have evidence of mild steal with intermittent numbness of the left hand.  Discussed with her that if this worsens or she develops any sores on her fingers, we should see her back.  We also discussed that if her arm swelling persists, we could do a fistulogram to loof for a central stenosis, however she is not on HD and would not proceed with this as it would require IV contrast.  She will call us if she has worsening arm swelling -she has an excellent  thrill in the fistula and it is easily palpable.   -left arm swelling looks much improved from picture on 10/07/2022.  -pt's access can be used mid June if needed. . -if pt has tunneled catheter, this can be removed at the discretion of the dialysis center once the pt's access has been successfully cannulated to their satisfaction.  -discussed with pt that access does not last forever and will need intervention or even new access at some point.  -the pt will follow up as needed   Doreatha Massed, [facility] [phone]  Clinic MD:  pt seen with Dr. Chestine Spore

## 2022-11-06 ENCOUNTER — Encounter (HOSPITAL_COMMUNITY)
Admission: RE | Admit: 2022-11-06 | Discharge: 2022-11-06 | Disposition: A | Discharge status: Home / Self Care | Payer: Medicare Other | Source: Ambulatory Visit | Attending: Nephrology | Admitting: Nephrology

## 2022-11-06 DIAGNOSIS — N184 Chronic kidney disease, stage 4 (severe): Secondary | ICD-10-CM | POA: Insufficient documentation

## 2022-11-06 DIAGNOSIS — Z992 Dependence on renal dialysis: Secondary | ICD-10-CM | POA: Diagnosis not present

## 2022-11-06 DIAGNOSIS — D631 Anemia in chronic kidney disease: Secondary | ICD-10-CM | POA: Insufficient documentation

## 2022-11-06 MED ORDER — SODIUM CHLORIDE 0.9 % IV SOLN
200.0000 mg | INTRAVENOUS | Status: DC
  Administered 2022-11-06: 200 mg via INTRAVENOUS
  Filled 2022-11-06: qty 10

## 2022-11-08 DIAGNOSIS — N184 Chronic kidney disease, stage 4 (severe): Secondary | ICD-10-CM | POA: Diagnosis not present

## 2022-11-13 ENCOUNTER — Encounter (HOSPITAL_COMMUNITY)
Admission: RE | Admit: 2022-11-13 | Discharge: 2022-11-13 | Disposition: A | Discharge status: Home / Self Care | Payer: Medicare Other | Source: Ambulatory Visit | Attending: Nephrology | Admitting: Nephrology

## 2022-11-13 DIAGNOSIS — Z992 Dependence on renal dialysis: Secondary | ICD-10-CM | POA: Insufficient documentation

## 2022-11-13 DIAGNOSIS — D631 Anemia in chronic kidney disease: Secondary | ICD-10-CM | POA: Diagnosis not present

## 2022-11-13 DIAGNOSIS — N184 Chronic kidney disease, stage 4 (severe): Secondary | ICD-10-CM | POA: Diagnosis not present

## 2022-11-13 MED ORDER — SODIUM CHLORIDE 0.9 % IV SOLN
200.0000 mg | INTRAVENOUS | Status: DC
  Administered 2022-11-13: 200 mg via INTRAVENOUS
  Filled 2022-11-13: qty 200

## 2022-11-15 DIAGNOSIS — N184 Chronic kidney disease, stage 4 (severe): Secondary | ICD-10-CM | POA: Diagnosis not present

## 2022-11-15 DIAGNOSIS — N2581 Secondary hyperparathyroidism of renal origin: Secondary | ICD-10-CM | POA: Diagnosis not present

## 2022-11-15 DIAGNOSIS — I770 Arteriovenous fistula, acquired: Secondary | ICD-10-CM | POA: Diagnosis not present

## 2022-11-15 DIAGNOSIS — D631 Anemia in chronic kidney disease: Secondary | ICD-10-CM | POA: Diagnosis not present

## 2022-11-15 DIAGNOSIS — I129 Hypertensive chronic kidney disease with stage 1 through stage 4 chronic kidney disease, or unspecified chronic kidney disease: Secondary | ICD-10-CM | POA: Diagnosis not present

## 2022-11-20 ENCOUNTER — Encounter (HOSPITAL_COMMUNITY)
Admission: RE | Admit: 2022-11-20 | Discharge: 2022-11-20 | Disposition: A | Discharge status: Home / Self Care | Payer: Medicare Other | Source: Ambulatory Visit | Attending: Nephrology | Admitting: Nephrology

## 2022-11-20 DIAGNOSIS — Z992 Dependence on renal dialysis: Secondary | ICD-10-CM | POA: Diagnosis not present

## 2022-11-20 DIAGNOSIS — D631 Anemia in chronic kidney disease: Secondary | ICD-10-CM | POA: Diagnosis not present

## 2022-11-20 DIAGNOSIS — N184 Chronic kidney disease, stage 4 (severe): Secondary | ICD-10-CM | POA: Diagnosis not present

## 2022-11-20 MED ORDER — SODIUM CHLORIDE 0.9 % IV SOLN
200.0000 mg | INTRAVENOUS | Status: DC
  Administered 2022-11-20: 200 mg via INTRAVENOUS
  Filled 2022-11-20: qty 200

## 2022-11-27 ENCOUNTER — Ambulatory Visit (HOSPITAL_COMMUNITY)
Admission: RE | Admit: 2022-11-27 | Discharge: 2022-11-27 | Disposition: A | Discharge status: Home / Self Care | Payer: Medicare Other | Source: Ambulatory Visit | Attending: Nephrology | Admitting: Nephrology

## 2022-11-27 VITALS — BP 119/52 | HR 55 | Temp 97.7°F | Resp 17

## 2022-11-27 DIAGNOSIS — D631 Anemia in chronic kidney disease: Secondary | ICD-10-CM | POA: Insufficient documentation

## 2022-11-27 DIAGNOSIS — N184 Chronic kidney disease, stage 4 (severe): Secondary | ICD-10-CM | POA: Diagnosis not present

## 2022-11-27 LAB — POCT HEMOGLOBIN-HEMACUE: Hemoglobin: 10 g/dL — ABNORMAL LOW (ref 12.0–15.0)

## 2022-11-27 MED ORDER — DARBEPOETIN ALFA 60 MCG/0.3ML IJ SOSY
PREFILLED_SYRINGE | INTRAMUSCULAR | Status: AC
  Administered 2022-11-27: 60 ug via SUBCUTANEOUS
  Filled 2022-11-27: qty 0.3

## 2022-11-27 MED ORDER — SODIUM CHLORIDE 0.9 % IV SOLN
200.0000 mg | INTRAVENOUS | Status: DC
  Administered 2022-11-27: 200 mg via INTRAVENOUS
  Filled 2022-11-27: qty 200

## 2022-11-27 MED ORDER — DARBEPOETIN ALFA 60 MCG/0.3ML IJ SOSY: 60.0000 ug | PREFILLED_SYRINGE | INTRAMUSCULAR | Status: DC

## 2022-12-05 ENCOUNTER — Encounter: Payer: Medicare Other | Admitting: Cardiology

## 2022-12-05 NOTE — Progress Notes (Deleted)
Patient referred by Zoila Shutter, MD for irregular heart rhythm  Subjective:   Linda Rice, female    DOB: March 28, 1941, 82 y.o.   MRN: [id number]   Chief Complaint  Patient presents with   premature atrial contraction   Follow-up   Results     HPI  82 y.o. Caucasian female with hypertension, hyperlipidemia, asthma, hypothyroidism, frequent PACs  ***    Current Outpatient Medications:    albuterol (VENTOLIN HFA) 108 (90 Base) MCG/ACT inhaler, Inhale 1-2 puffs into the lungs every 6 (six) hours as needed for wheezing or shortness of breath., Disp: 18 g, Rfl: 1   atorvastatin (LIPITOR) 10 MG tablet, Take 10 mg by mouth in the morning., Disp: , Rfl:    Calcium Carb-Cholecalciferol (CALCIUM 500 + D PO), Take 1 tablet by mouth daily., Disp: , Rfl:    cetirizine (ZYRTEC) 10 MG tablet, Take 10 mg by mouth at bedtime., Disp: , Rfl:    Darbepoetin Alfa (ARANESP) 40 MCG/0.4ML SOSY injection, Inject 40 mcg into the skin every 28 (twenty-eight) days., Disp: , Rfl:    doxycycline (VIBRAMYCIN) 100 MG capsule, Take 100 mg by mouth 2 (two) times daily., Disp: , Rfl: 4   EPINEPHrine 0.3 mg/0.3 mL IJ SOAJ injection, Inject 0.3 mLs (0.3 mg total) into the muscle as needed for anaphylaxis., Disp: 2 each, Rfl: 1   furosemide (LASIX) 40 MG tablet, Take 40 mg by mouth daily., Disp: , Rfl:    Levothyroxine Sodium 88 MCG CAPS, Take 88 mcg by mouth daily before breakfast., Disp: , Rfl:    Magnesium 500 MG TABS, Take 500 mg by mouth daily., Disp: , Rfl:    metoprolol tartrate (LOPRESSOR) 25 MG tablet, Take 1 tablet (25 mg total) by mouth 2 (two) times daily., Disp: 1 tablet, Rfl: 0   mirtazapine (REMERON) 30 MG tablet, Take 30 mg by mouth at bedtime., Disp: , Rfl:    Multiple Vitamins-Minerals (MULTIVITAMIN WITH MINERALS) tablet, Take 1 tablet by mouth daily. Woman, Disp: , Rfl:    sodium bicarbonate 650 MG tablet, Take 1,300 mg by mouth 2 (two) times daily., Disp: , Rfl:   Cardiovascular  studies:  EKG 09/21/2022: Sinus rhythm  Frequent PACs   Mobile cardiac telemetry 13 days 09/21/2022 - 10/04/2022: Dominant rhythm: Sinus. HR 48-103 bpm. Avg HR 68 bpm, in sinus rhythm. 687 episodes of probable atrial tachycardia, fastest at 167 bpm for 4 beats, longest for 23.9 secs at 104 bpm. 10% isolated SVE, 4.1% couplets, 1.6% triplets. 1 episode of VT, fastest at 111 bpm for 4 beats. <1% isolated VE,  couplet/triplets. No atrial fibrillation/atrial flutter/high grade AV block, sinus pause >3sec noted. 0 patient triggered events.   Echocardiogram 07/19/2022: Normal LV systolic function with visual EF 55-60%. Left ventricle cavity is normal in size. Normal left ventricular wall thickness. Normal global wall motion. Doppler evidence of grade I (impaired) diastolic dysfunction, normal LAP. Calculated EF 51%. Left atrial cavity is moderately dilated at 37.9 ml/m^2. Trileaflet aortic valve.  Mild (Grade I) aortic regurgitation. Sclerosis of the aortic valve. Structurally normal mitral valve.  Mild (Grade I) mitral regurgitation. Structurally normal tricuspid valve.  Mild tricuspid regurgitation. No evidence of pulmonary hypertension. No significant change compared to 06/2020.  CT head 10/06/2022: 1. No acute intracranial process. 2. Stable mild diffuse atrophy and chronic small vessel ischemic change. 3. Old lacunar infarct in the left basal ganglia, new from prior.   Recent labs: 10/09/2022: Glucose 81, BUN/Cr 45/2.76. EGFR 17.  Na/K 138/4.2. Albumin 2.4. Protein 5.6. Rest of the CMP normal H/H 9/27. MCV 86. Platelets 231 TSH 11.6 normal   Review of Systems  Cardiovascular:  Negative for chest pain, dyspnea on exertion, leg swelling, palpitations and syncope.  Neurological:  Positive for dizziness and headaches. Negative for focal weakness.         There were no vitals filed for this visit.   There is no height or weight on file to calculate BMI. There were no vitals  filed for this visit.    Objective:   Physical Exam Vitals and nursing note reviewed.  Constitutional:      General: She is not in acute distress.    Appearance: She is well-developed.  HENT:     Head:     Comments: Ecchymosis in back of scalp Neck:     Vascular: No JVD.  Cardiovascular:     Rate and Rhythm: Regular rhythm. Tachycardia present. Frequent Extrasystoles are present.    Pulses: Intact distal pulses.     Heart sounds: Normal heart sounds. No murmur heard. Pulmonary:     Effort: Pulmonary effort is normal.     Breath sounds: Normal breath sounds. No wheezing or rales.  Musculoskeletal:     Right lower leg: No edema.     Left lower leg: No edema.    Visit diagnoses:  No diagnosis found.  No orders of the defined types were placed in this encounter.        Assessment & Recommendations:    82 y.o. Caucasian female with hypertension, hyperlipidemia, asthma, hypothyroidism, frequent PACs/MAT, CKD IV  *** Frequent PACs/MAT: Longstanding, asymptomatic issue. No A-fib has been documented on EKG with me, she reportedly had an episode of A-fib while in [place] that required hospitalization.  EKG from [place] is not available for my review.  Nonetheless, I have to assume that she did indeed have an episode of A-fib, albeit in the setting of severe hypomagnesemia. Now she is on magnesium supplement. No Afib noted on 2 week cardiac telemetry.  Her CHA2DS2VASc score  is high, and she would ideally require anticoagulation for stroke prevention.  However, given her anemia of CKD, frequent falls, and recent head trauma, we had mutually decided to hold off anticoagulation.    Continue metoprolol tartrate 25 mg, probably bid given low normal blood pressures.  Generalized fatigue likely due to anemia. Continue management as per nephrology.   Hypertension: Well controlled.  CKD: Continue follow-up with nephrology.    F/u in 6 weeks    Elder Negus,  MD Pager: [phone] Office: [phone]

## 2022-12-14 IMAGING — US US RENAL
1 series · 14 of 25 positions shown · non-contrast
Comparison: 11/11/2001.

CLINICAL DATA: Hydronephrosis bilateral

EXAM:
RENAL / URINARY TRACT ULTRASOUND COMPLETE

[Series 1: us renal · 0.19mm/px · 14 of 46 slices shown]
[im 1/46]
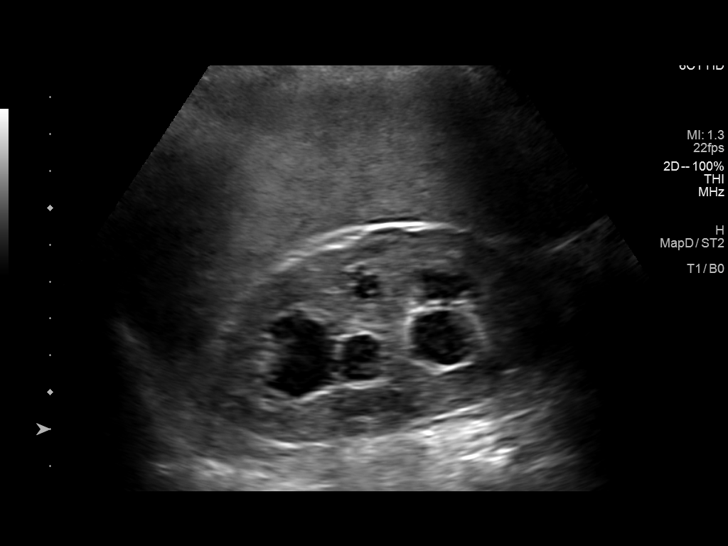
[im 4/46]
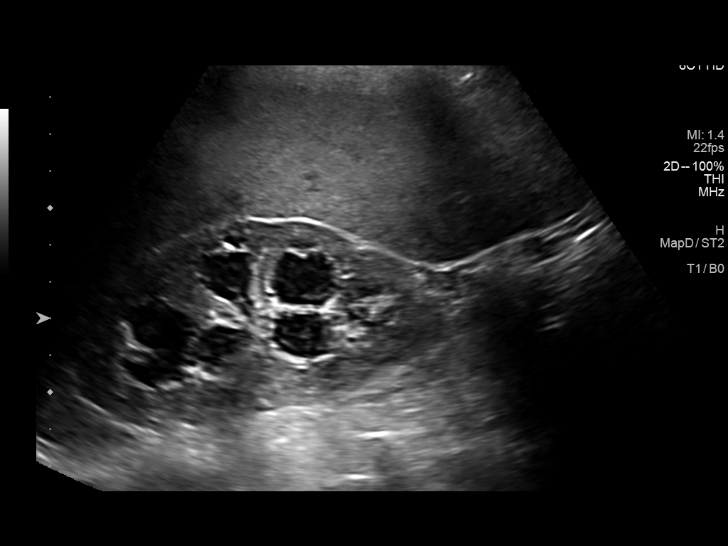
[im 8/46]
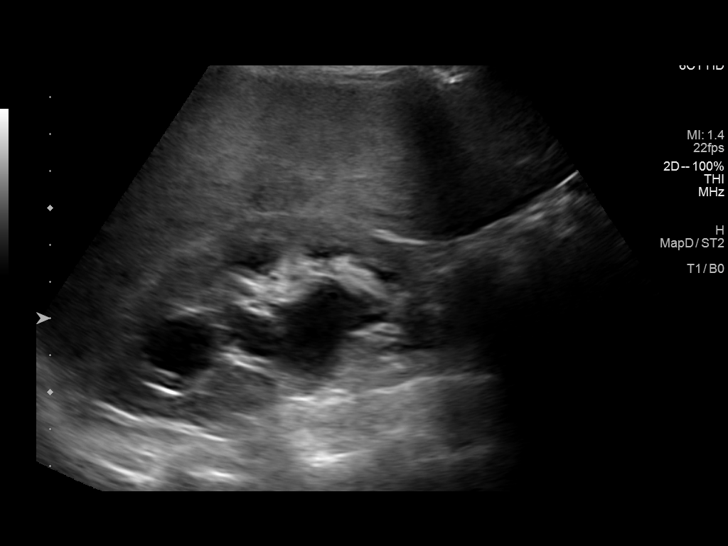
[im 12/46]
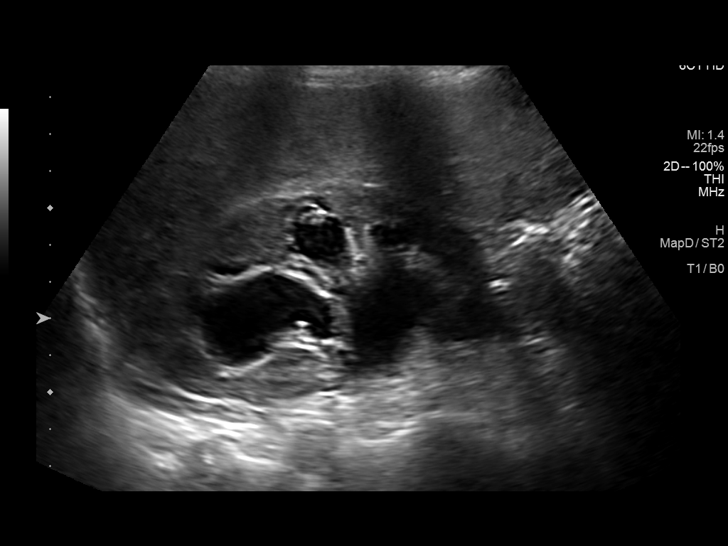
[im 16/46]
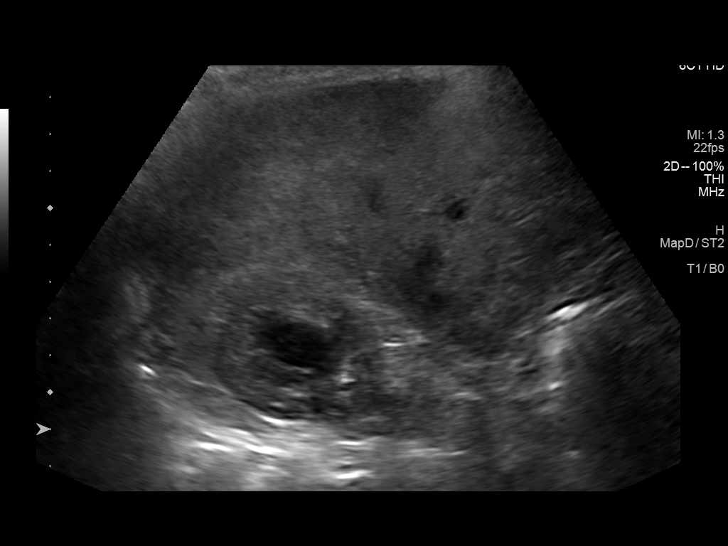
[im 17/46]
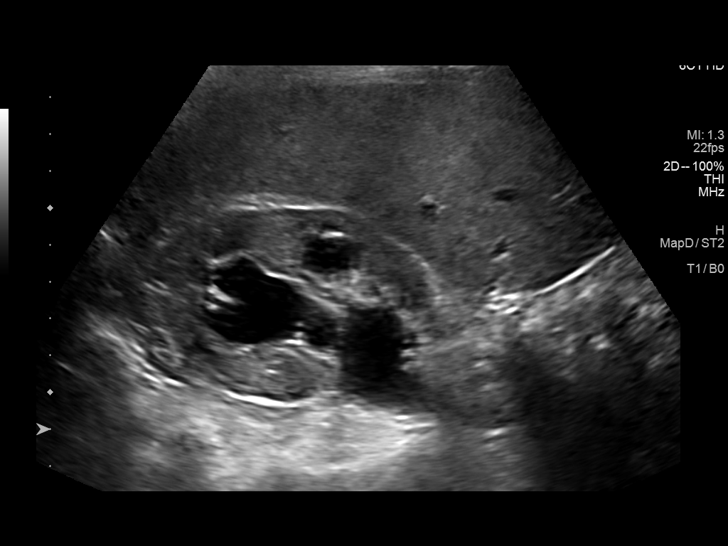
[im 21/46]
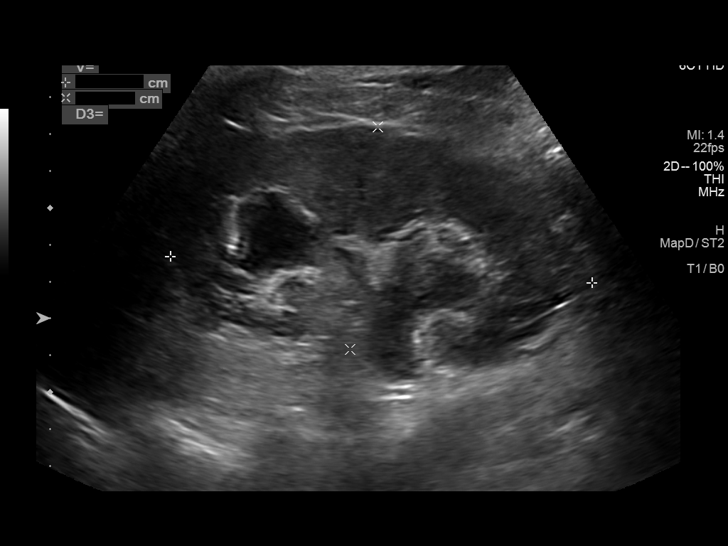
[im 25/46]
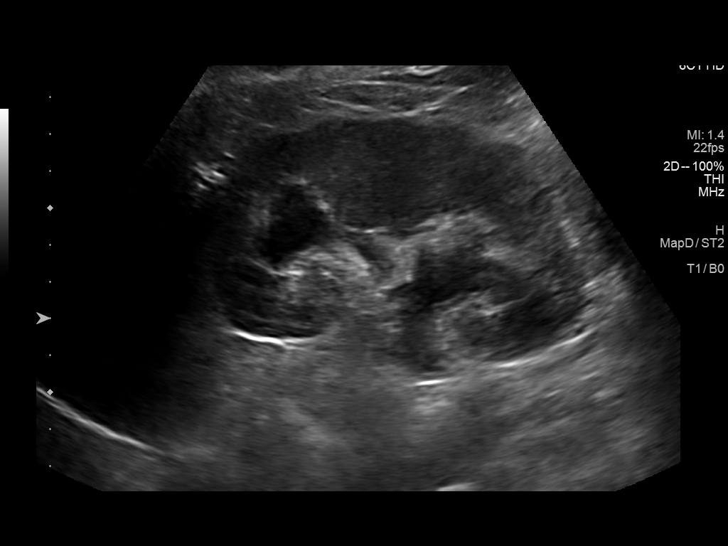
[im 29/46]
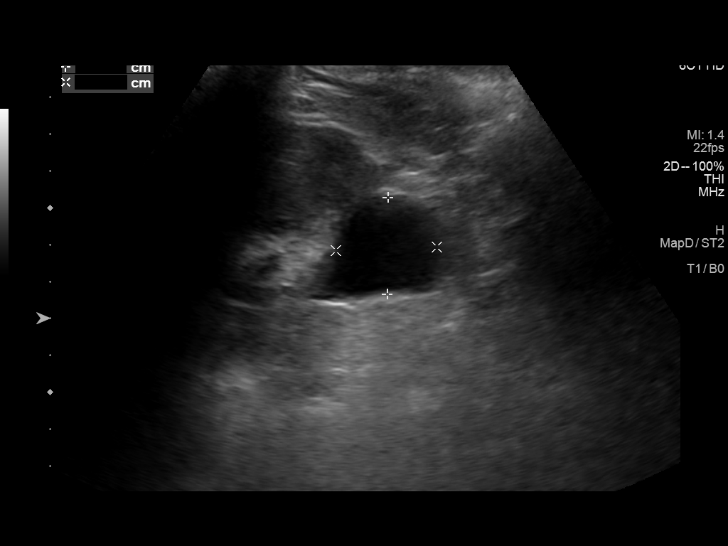
[im 31/46]
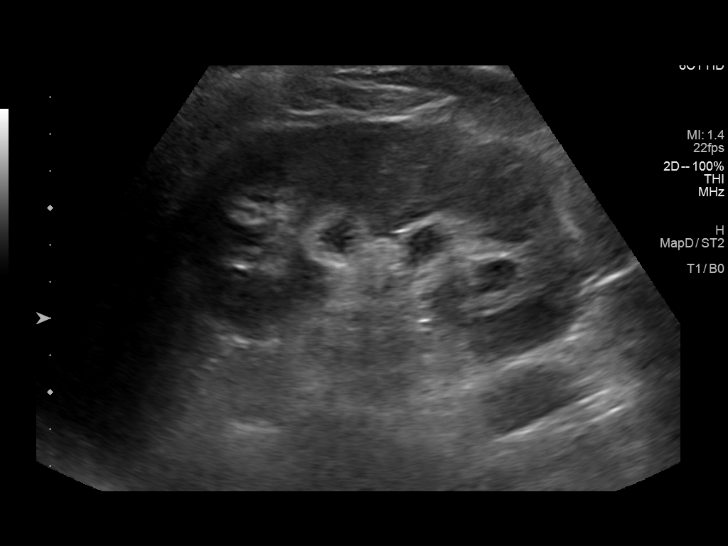
[im 34/46]
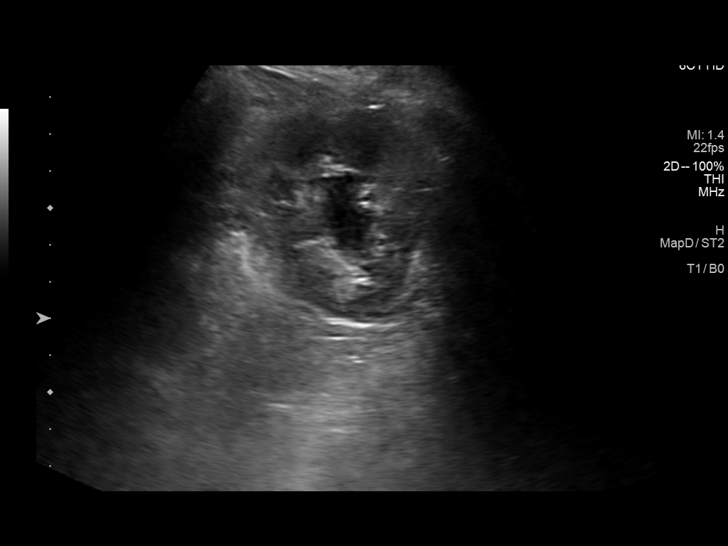
[im 38/46]
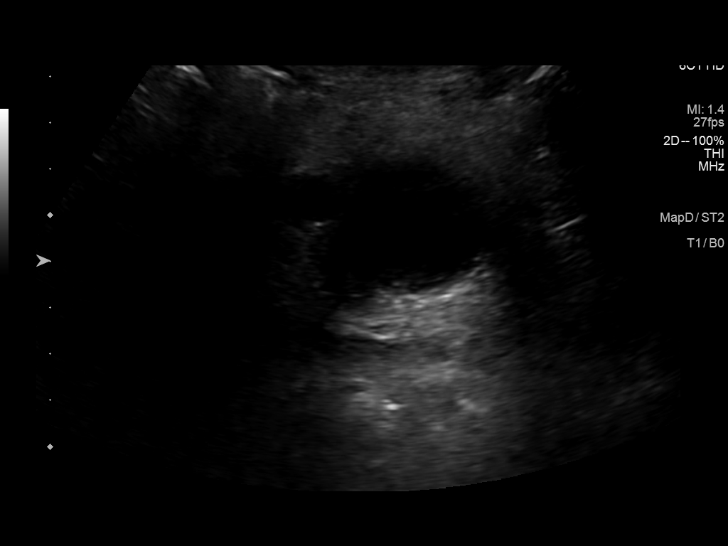
[im 42/46]
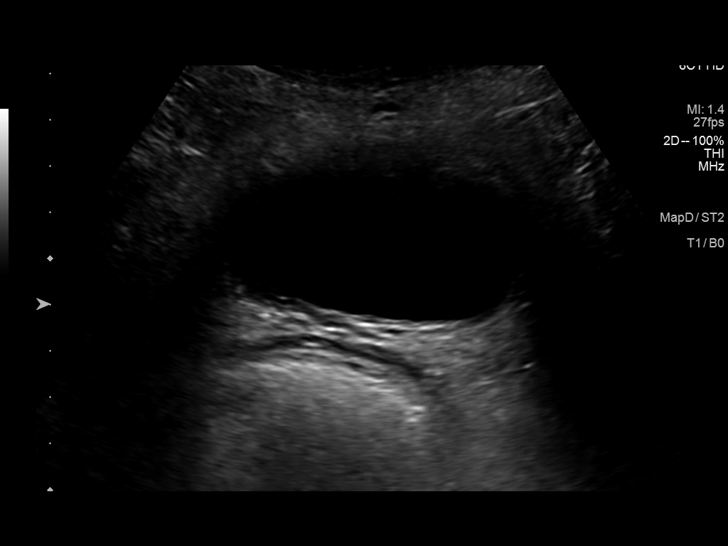
[im 46/46]
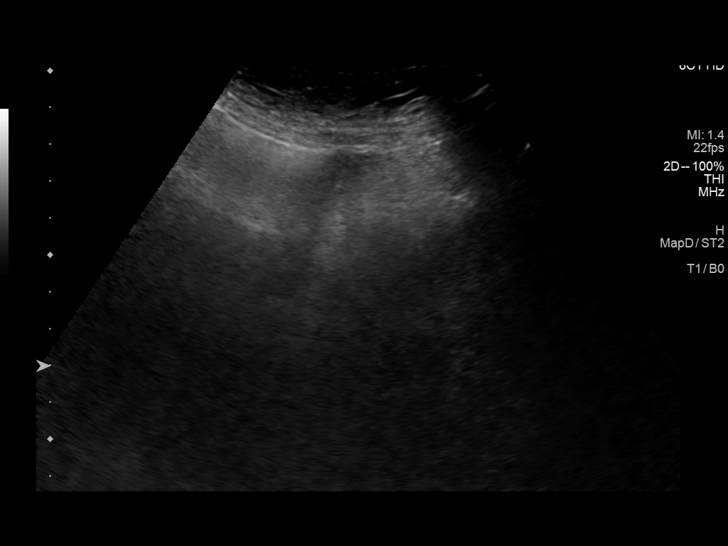

[14 of 25 positions shown; findings below may reference images not displayed]

FINDINGS: Right Kidney:

Renal measurements: 10.7 x 5.1 x 5.9 cm = volume: 169 mL. Increased
parenchymal echogenicity. Moderate hydronephrosis is present. No
mass.

Left Kidney:

Renal measurements: 11.5 x 6.1 x 5.8 cm = volume: 211 mL. Increased
parenchymal echogenicity. 3.1 cm interpole simple cyst.
Mild-to-moderate hydronephrosis is present.

Bladder:

Appears normal for degree of bladder distention.

Other:

None.
IMPRESSION: Mild-to-moderate bilateral hydronephrosis.

Echogenic renal parenchyma may reflect sequela of medical renal
disease.

3.1 cm left renal cyst.

## 2022-12-25 ENCOUNTER — Ambulatory Visit (HOSPITAL_COMMUNITY)
Admission: RE | Admit: 2022-12-25 | Discharge: 2022-12-25 | Disposition: A | Discharge status: Home / Self Care | Payer: Medicare Other | Source: Ambulatory Visit | Attending: Nephrology | Admitting: Nephrology

## 2022-12-25 VITALS — BP 127/71 | HR 87 | Temp 97.3°F | Resp 18

## 2022-12-25 DIAGNOSIS — N184 Chronic kidney disease, stage 4 (severe): Secondary | ICD-10-CM | POA: Insufficient documentation

## 2022-12-25 LAB — IRON AND TIBC
Iron: 35 ug/dL (ref 28–170)
Saturation Ratios: 16 % (ref 10.4–31.8)
TIBC: 218 ug/dL — ABNORMAL LOW (ref 250–450)
UIBC: 183 ug/dL

## 2022-12-25 LAB — FERRITIN: Ferritin: 155 ng/mL (ref 11–307)

## 2022-12-25 LAB — POCT HEMOGLOBIN-HEMACUE: Hemoglobin: 11.9 g/dL — ABNORMAL LOW (ref 12.0–15.0)

## 2022-12-25 MED ORDER — DARBEPOETIN ALFA 60 MCG/0.3ML IJ SOSY
PREFILLED_SYRINGE | INTRAMUSCULAR | Status: AC
  Filled 2022-12-25: qty 0.3

## 2022-12-25 MED ORDER — DARBEPOETIN ALFA 60 MCG/0.3ML IJ SOSY
60.0000 ug | PREFILLED_SYRINGE | INTRAMUSCULAR | Status: DC
  Administered 2022-12-25: 60 ug via SUBCUTANEOUS

## 2022-12-27 DIAGNOSIS — F325 Major depressive disorder, single episode, in full remission: Secondary | ICD-10-CM | POA: Diagnosis not present

## 2022-12-27 DIAGNOSIS — Z936 Other artificial openings of urinary tract status: Secondary | ICD-10-CM | POA: Diagnosis not present

## 2022-12-27 DIAGNOSIS — M8668 Other chronic osteomyelitis, other site: Secondary | ICD-10-CM | POA: Diagnosis not present

## 2022-12-27 DIAGNOSIS — J90 Pleural effusion, not elsewhere classified: Secondary | ICD-10-CM | POA: Diagnosis not present

## 2022-12-27 DIAGNOSIS — R627 Adult failure to thrive: Secondary | ICD-10-CM | POA: Diagnosis not present

## 2022-12-27 DIAGNOSIS — J4520 Mild intermittent asthma, uncomplicated: Secondary | ICD-10-CM | POA: Diagnosis not present

## 2022-12-27 DIAGNOSIS — I129 Hypertensive chronic kidney disease with stage 1 through stage 4 chronic kidney disease, or unspecified chronic kidney disease: Secondary | ICD-10-CM | POA: Diagnosis not present

## 2022-12-27 DIAGNOSIS — N184 Chronic kidney disease, stage 4 (severe): Secondary | ICD-10-CM | POA: Diagnosis not present

## 2023-01-04 DIAGNOSIS — N184 Chronic kidney disease, stage 4 (severe): Secondary | ICD-10-CM | POA: Diagnosis not present

## 2023-01-09 DIAGNOSIS — E872 Acidosis, unspecified: Secondary | ICD-10-CM | POA: Diagnosis not present

## 2023-01-09 DIAGNOSIS — D631 Anemia in chronic kidney disease: Secondary | ICD-10-CM | POA: Diagnosis not present

## 2023-01-09 DIAGNOSIS — N184 Chronic kidney disease, stage 4 (severe): Secondary | ICD-10-CM | POA: Diagnosis not present

## 2023-01-09 DIAGNOSIS — I770 Arteriovenous fistula, acquired: Secondary | ICD-10-CM | POA: Diagnosis not present

## 2023-01-09 DIAGNOSIS — I129 Hypertensive chronic kidney disease with stage 1 through stage 4 chronic kidney disease, or unspecified chronic kidney disease: Secondary | ICD-10-CM | POA: Diagnosis not present

## 2023-01-09 DIAGNOSIS — R609 Edema, unspecified: Secondary | ICD-10-CM | POA: Diagnosis not present

## 2023-01-09 DIAGNOSIS — N2581 Secondary hyperparathyroidism of renal origin: Secondary | ICD-10-CM | POA: Diagnosis not present

## 2023-01-22 ENCOUNTER — Encounter (HOSPITAL_COMMUNITY): Payer: Medicare Other

## 2023-01-24 ENCOUNTER — Encounter (HOSPITAL_COMMUNITY): Payer: Medicare Other

## 2023-02-08 ENCOUNTER — Encounter: Payer: Self-pay | Admitting: Cardiology

## 2023-02-08 ENCOUNTER — Ambulatory Visit: Payer: Medicare Other | Admitting: Cardiology

## 2023-02-08 VITALS — BP 123/67 | HR 56 | Resp 16 | Ht 63.0 in | Wt 145.0 lb

## 2023-02-08 DIAGNOSIS — I491 Atrial premature depolarization: Secondary | ICD-10-CM

## 2023-02-08 NOTE — Progress Notes (Signed)
Patient referred by Zoila Shutter, MD for irregular heart rhythm  Subjective:   Linda Rice, female    DOB: 03-24-1941, 82 y.o.   MRN: [id number]   Chief Complaint  Patient presents with   PAC (premature atrial contraction)   Follow-up    6 month     HPI  82 y.o. Caucasian female with hypertension, hyperlipidemia, CKD 4, asthma, hypothyroidism, frequent PACs  Patient is doing well.  She has not had any recent palpitations, presyncope, syncope symptoms.  She is following regularly with nephrology, GFR stable between 8-20.    Current Outpatient Medications:    albuterol (VENTOLIN HFA) 108 (90 Base) MCG/ACT inhaler, Inhale 1-2 puffs into the lungs every 6 (six) hours as needed for wheezing or shortness of breath., Disp: 18 g, Rfl: 1   atorvastatin (LIPITOR) 10 MG tablet, Take 10 mg by mouth in the morning., Disp: , Rfl:    Calcium Carb-Cholecalciferol (CALCIUM 500 + D PO), Take 1 tablet by mouth daily., Disp: , Rfl:    cetirizine (ZYRTEC) 10 MG tablet, Take 10 mg by mouth at bedtime., Disp: , Rfl:    Darbepoetin Alfa (ARANESP) 40 MCG/0.4ML SOSY injection, Inject 40 mcg into the skin every 28 (twenty-eight) days., Disp: , Rfl:    doxycycline (VIBRAMYCIN) 100 MG capsule, Take 100 mg by mouth 2 (two) times daily., Disp: , Rfl: 4   EPINEPHrine 0.3 mg/0.3 mL IJ SOAJ injection, Inject 0.3 mLs (0.3 mg total) into the muscle as needed for anaphylaxis., Disp: 2 each, Rfl: 1   furosemide (LASIX) 40 MG tablet, Take 40 mg by mouth daily., Disp: , Rfl:    Levothyroxine Sodium 88 MCG CAPS, Take 88 mcg by mouth daily before breakfast., Disp: , Rfl:    Magnesium 500 MG TABS, Take 500 mg by mouth daily., Disp: , Rfl:    metoprolol tartrate (LOPRESSOR) 25 MG tablet, Take 1 tablet (25 mg total) by mouth 2 (two) times daily., Disp: 1 tablet, Rfl: 0   mirtazapine (REMERON) 30 MG tablet, Take 30 mg by mouth at bedtime., Disp: , Rfl:    Multiple Vitamins-Minerals (MULTIVITAMIN WITH  MINERALS) tablet, Take 1 tablet by mouth daily. Woman, Disp: , Rfl:    sodium bicarbonate 650 MG tablet, Take 1,300 mg by mouth 2 (two) times daily., Disp: , Rfl:   Cardiovascular studies:  EKG 02/08/2023: Sinus rhythm 72 bpm Frequent PACs  Low voltage   Mobile cardiac telemetry 13 days 09/21/2022 - 10/04/2022: Dominant rhythm: Sinus. HR 48-103 bpm. Avg HR 68 bpm, in sinus rhythm. 687 episodes of probable atrial tachycardia, fastest at 167 bpm for 4 beats, longest for 23.9 secs at 104 bpm. 10% isolated SVE, 4.1% couplets, 1.6% triplets. 1 episode of VT, fastest at 111 bpm for 4 beats. <1% isolated VE,  couplet/triplets. No atrial fibrillation/atrial flutter/high grade AV block, sinus pause >3sec noted. 0 patient triggered events.   Echocardiogram 07/19/2022: Normal LV systolic function with visual EF 55-60%. Left ventricle cavity is normal in size. Normal left ventricular wall thickness. Normal global wall motion. Doppler evidence of grade I (impaired) diastolic dysfunction, normal LAP. Calculated EF 51%. Left atrial cavity is moderately dilated at 37.9 ml/m^2. Trileaflet aortic valve.  Mild (Grade I) aortic regurgitation. Sclerosis of the aortic valve. Structurally normal mitral valve.  Mild (Grade I) mitral regurgitation. Structurally normal tricuspid valve.  Mild tricuspid regurgitation. No evidence of pulmonary hypertension. No significant change compared to 06/2020.  CT head 10/06/2022: 1. No acute intracranial process.  2. Stable mild diffuse atrophy and chronic small vessel ischemic change. 3. Old lacunar infarct in the left basal ganglia, new from prior.   Recent labs: 10/09/2022: Glucose 81, BUN/Cr 45/2.76. EGFR 17. Na/K 138/4.2. Albumin 2.4. Protein 5.6. Rest of the CMP normal H/H 9/27. MCV 86. Platelets 231 TSH 11.6 normal   Review of Systems  Cardiovascular:  Negative for chest pain, dyspnea on exertion, leg swelling, palpitations and syncope.  Neurological:   Positive for dizziness and headaches. Negative for focal weakness.         Vitals:   02/08/23 1146  BP: 123/67  Pulse: (!) 56  Resp: 16  SpO2: 97%     Body mass index is 25.69 kg/m. Filed Weights   02/08/23 1146  Weight: 145 lb (65.8 kg)      Objective:   Physical Exam Vitals and nursing note reviewed.  Constitutional:      General: She is not in acute distress.    Appearance: She is well-developed.  HENT:     Head:     Comments: Ecchymosis in back of scalp Neck:     Vascular: No JVD.  Cardiovascular:     Rate and Rhythm: Regular rhythm. Tachycardia present. Frequent Extrasystoles are present.    Pulses: Intact distal pulses.     Heart sounds: Normal heart sounds. No murmur heard. Pulmonary:     Effort: Pulmonary effort is normal.     Breath sounds: Normal breath sounds. No wheezing or rales.  Musculoskeletal:     Right lower leg: No edema.     Left lower leg: No edema.   Visit diagnoses:    ICD-10-CM   1. PAC (premature atrial contraction)  I49.1 EKG 12-Lead      No orders of the defined types were placed in this encounter.        Assessment & Recommendations:    82 y.o. Caucasian female with hypertension, hyperlipidemia, asthma, hypothyroidism, frequent PACs/MAT, CKD IV  Frequent PACs/MAT: Longstanding, asymptomatic issue. No A-fib has been documented on EKG with me, she reportedly had an episode of A-fib while in [place] that required hospitalization.  EKG from [place] is not available for my review.  Nonetheless, I have to assume that she did indeed have an episode of A-fib, albeit in the setting of severe hypomagnesemia. Now she is on magnesium supplement. No Afib noted on 2 week cardiac telemetry (09/2022).  Her CHA2DS2VASc score  is high, and she would ideally require anticoagulation for stroke prevention.  However, given her anemia of CKD, frequent falls, and recent head trauma, we had mutually decided to hold off anticoagulation.     Continue metoprolol tartrate 25 mg bid.  Hypertension: Well controlled.  CKD: Continue follow-up with nephrology.    F/u in 6 months    Elder Negus, MD Pager: [phone] Office: [phone]

## 2023-02-13 ENCOUNTER — Ambulatory Visit: Payer: Medicare Other | Admitting: Cardiology

## 2023-02-15 NOTE — Progress Notes (Signed)
This encounter was created in error - please disregard.

## 2023-03-06 DIAGNOSIS — N184 Chronic kidney disease, stage 4 (severe): Secondary | ICD-10-CM | POA: Diagnosis not present

## 2023-03-11 DIAGNOSIS — M1812 Unilateral primary osteoarthritis of first carpometacarpal joint, left hand: Secondary | ICD-10-CM | POA: Diagnosis not present

## 2023-03-11 DIAGNOSIS — G5602 Carpal tunnel syndrome, left upper limb: Secondary | ICD-10-CM | POA: Diagnosis not present

## 2023-03-14 DIAGNOSIS — N2581 Secondary hyperparathyroidism of renal origin: Secondary | ICD-10-CM | POA: Diagnosis not present

## 2023-03-14 DIAGNOSIS — R609 Edema, unspecified: Secondary | ICD-10-CM | POA: Diagnosis not present

## 2023-03-14 DIAGNOSIS — I770 Arteriovenous fistula, acquired: Secondary | ICD-10-CM | POA: Diagnosis not present

## 2023-03-14 DIAGNOSIS — Z23 Encounter for immunization: Secondary | ICD-10-CM | POA: Diagnosis not present

## 2023-03-14 DIAGNOSIS — N184 Chronic kidney disease, stage 4 (severe): Secondary | ICD-10-CM | POA: Diagnosis not present

## 2023-03-14 DIAGNOSIS — D631 Anemia in chronic kidney disease: Secondary | ICD-10-CM | POA: Diagnosis not present

## 2023-03-14 DIAGNOSIS — I129 Hypertensive chronic kidney disease with stage 1 through stage 4 chronic kidney disease, or unspecified chronic kidney disease: Secondary | ICD-10-CM | POA: Diagnosis not present

## 2023-03-18 DIAGNOSIS — Z87891 Personal history of nicotine dependence: Secondary | ICD-10-CM | POA: Diagnosis not present

## 2023-03-18 DIAGNOSIS — Z0000 Encounter for general adult medical examination without abnormal findings: Secondary | ICD-10-CM | POA: Diagnosis not present

## 2023-03-18 DIAGNOSIS — Z13220 Encounter for screening for lipoid disorders: Secondary | ICD-10-CM | POA: Diagnosis not present

## 2023-03-18 DIAGNOSIS — E039 Hypothyroidism, unspecified: Secondary | ICD-10-CM | POA: Diagnosis not present

## 2023-03-18 DIAGNOSIS — N184 Chronic kidney disease, stage 4 (severe): Secondary | ICD-10-CM | POA: Diagnosis not present

## 2023-03-18 DIAGNOSIS — M816 Localized osteoporosis [Lequesne]: Secondary | ICD-10-CM | POA: Diagnosis not present

## 2023-03-18 DIAGNOSIS — M810 Age-related osteoporosis without current pathological fracture: Secondary | ICD-10-CM | POA: Diagnosis not present

## 2023-03-18 DIAGNOSIS — I129 Hypertensive chronic kidney disease with stage 1 through stage 4 chronic kidney disease, or unspecified chronic kidney disease: Secondary | ICD-10-CM | POA: Diagnosis not present

## 2023-03-29 DIAGNOSIS — Z96642 Presence of left artificial hip joint: Secondary | ICD-10-CM | POA: Diagnosis not present

## 2023-03-29 DIAGNOSIS — M25551 Pain in right hip: Secondary | ICD-10-CM | POA: Diagnosis not present

## 2023-03-29 DIAGNOSIS — Z96653 Presence of artificial knee joint, bilateral: Secondary | ICD-10-CM | POA: Diagnosis not present

## 2023-04-04 DIAGNOSIS — G5602 Carpal tunnel syndrome, left upper limb: Secondary | ICD-10-CM | POA: Diagnosis not present

## 2023-04-11 ENCOUNTER — Other Ambulatory Visit: Payer: Self-pay | Admitting: Medical Genetics

## 2023-04-11 DIAGNOSIS — Z006 Encounter for examination for normal comparison and control in clinical research program: Secondary | ICD-10-CM

## 2023-04-11 DIAGNOSIS — G5602 Carpal tunnel syndrome, left upper limb: Secondary | ICD-10-CM | POA: Diagnosis not present

## 2023-04-29 DIAGNOSIS — R627 Adult failure to thrive: Secondary | ICD-10-CM | POA: Diagnosis not present

## 2023-04-29 DIAGNOSIS — N184 Chronic kidney disease, stage 4 (severe): Secondary | ICD-10-CM | POA: Diagnosis not present

## 2023-04-29 DIAGNOSIS — Z936 Other artificial openings of urinary tract status: Secondary | ICD-10-CM | POA: Diagnosis not present

## 2023-04-29 DIAGNOSIS — R630 Anorexia: Secondary | ICD-10-CM | POA: Diagnosis not present

## 2023-04-29 DIAGNOSIS — F325 Major depressive disorder, single episode, in full remission: Secondary | ICD-10-CM | POA: Diagnosis not present

## 2023-05-08 ENCOUNTER — Other Ambulatory Visit (HOSPITAL_COMMUNITY)
Admission: RE | Admit: 2023-05-08 | Discharge: 2023-05-08 | Disposition: A | Discharge status: Home / Self Care | Payer: Medicare Other | Source: Ambulatory Visit | Attending: Oncology | Admitting: Oncology

## 2023-05-08 DIAGNOSIS — Z006 Encounter for examination for normal comparison and control in clinical research program: Secondary | ICD-10-CM | POA: Insufficient documentation

## 2023-05-13 DIAGNOSIS — N184 Chronic kidney disease, stage 4 (severe): Secondary | ICD-10-CM | POA: Diagnosis not present

## 2023-05-15 DIAGNOSIS — N2581 Secondary hyperparathyroidism of renal origin: Secondary | ICD-10-CM | POA: Diagnosis not present

## 2023-05-15 DIAGNOSIS — I129 Hypertensive chronic kidney disease with stage 1 through stage 4 chronic kidney disease, or unspecified chronic kidney disease: Secondary | ICD-10-CM | POA: Diagnosis not present

## 2023-05-15 DIAGNOSIS — N184 Chronic kidney disease, stage 4 (severe): Secondary | ICD-10-CM | POA: Diagnosis not present

## 2023-05-15 DIAGNOSIS — I770 Arteriovenous fistula, acquired: Secondary | ICD-10-CM | POA: Diagnosis not present

## 2023-05-15 DIAGNOSIS — D631 Anemia in chronic kidney disease: Secondary | ICD-10-CM | POA: Diagnosis not present

## 2023-05-16 DIAGNOSIS — N1339 Other hydronephrosis: Secondary | ICD-10-CM | POA: Diagnosis not present

## 2023-05-16 DIAGNOSIS — Z9889 Other specified postprocedural states: Secondary | ICD-10-CM | POA: Diagnosis not present

## 2023-05-20 LAB — GENECONNECT MOLECULAR SCREEN: Genetic Analysis Overall Interpretation: NEGATIVE

## 2023-05-30 DIAGNOSIS — G5602 Carpal tunnel syndrome, left upper limb: Secondary | ICD-10-CM | POA: Diagnosis not present

## 2023-08-08 ENCOUNTER — Encounter: Payer: Self-pay | Admitting: Cardiology

## 2023-08-08 ENCOUNTER — Ambulatory Visit: Payer: Medicare Other | Attending: Cardiology | Admitting: Cardiology

## 2023-08-08 VITALS — BP 120/74 | HR 88 | Resp 16 | Ht 63.0 in | Wt 150.0 lb

## 2023-08-08 DIAGNOSIS — I480 Paroxysmal atrial fibrillation: Secondary | ICD-10-CM | POA: Insufficient documentation

## 2023-08-08 DIAGNOSIS — I491 Atrial premature depolarization: Secondary | ICD-10-CM | POA: Insufficient documentation

## 2023-08-08 MED ORDER — APIXABAN 2.5 MG PO TABS
2.5000 mg | ORAL_TABLET | Freq: Two times a day (BID) | ORAL | 3 refills | Status: DC
Start: 2023-08-08 — End: 2023-08-14

## 2023-08-08 MED ORDER — APIXABAN 2.5 MG PO TABS: 2.5000 mg | ORAL_TABLET | Freq: Two times a day (BID) | ORAL | Status: DC

## 2023-08-08 NOTE — Patient Instructions (Addendum)
 Medication Instructions:  START ELIQUIS 2.5 MG TWICE A DAY WITHOUT ANY MISSED DOSES   TAKE LASIX AS NEEDED ONLY  *If you need a refill on your cardiac medications before your next appointment, please call your pharmacy*   Lab Work:  If you have labs (blood work) drawn today and your tests are completely normal, you will receive your results only by: MyChart Message (if you have MyChart) OR A paper copy in the mail If you have any lab test that is abnormal or we need to change your treatment, we will call you to review the results.   Testing/Procedures: Your physician has requested that you have an echocardiogram. Echocardiography is a painless test that uses sound waves to create images of your heart. It provides your doctor with information about the size and shape of your heart and how well your heart's chambers and valves are working. This procedure takes approximately one hour. There are no restrictions for this procedure. Please do NOT wear cologne, perfume, aftershave, or lotions (deodorant is allowed). Please arrive 15 minutes prior to your appointment time.  Please note: We ask at that you not bring children with you during ultrasound (echo/ vascular) testing. Due to room size and safety concerns, children are not allowed in the ultrasound rooms during exams. Our front office staff cannot provide observation of children in our lobby area while testing is being conducted. An adult accompanying a patient to their appointment will only be allowed in the ultrasound room at the discretion of the ultrasound technician under special circumstances. We apologize for any inconvenience.    Follow-Up: At [facility], you and your health needs are our priority.  As part of our continuing mission to provide you with exceptional heart care, we have created designated Provider Care Teams.  These Care Teams include your primary Cardiologist (physician) and Advanced Practice Providers (APPs  -  Physician Assistants and Nurse Practitioners) who all work together to provide you with the care you need, when you need it.  We recommend signing up for the patient portal called "MyChart".  Sign up information is provided on this After Visit Summary.  MyChart is used to connect with patients for Virtual Visits (Telemedicine).  Patients are able to view lab/test results, encounter notes, upcoming appointments, etc.  Non-urgent messages can be sent to your provider as well.   To learn more about what you can do with MyChart, go to [email or web address].    Your next appointment:  4-6 WEEKS WITH DR Rosemary Holms OR APP

## 2023-08-08 NOTE — Progress Notes (Addendum)
 Cardiology Office Note:  .   Date:  08/08/2023  ID:  Linda Rice, DOB [birth year], MRN [id number] PCP: Zoila Shutter, MD  [facility] Providers Cardiologist:  Truett Mainland, MD PCP: Zoila Shutter, MD  Chief Complaint  Patient presents with   [place] (premature atrial contraction)   Follow-up    6 month      History of Present Illness: .    Linda Rice is a 83 y.o. female with hypertension, hyperlipidemia, CKD 4, asthma, hypothyroidism, frequent PACs  Patient is doing well, denies any complaints.  Recent nephrology follow-up was encouraging with mild improvement in GFR.  She denies any chest pain, shortness of breath, palpitation symptoms.  She reports occasional lightheadedness on standing up too quickly, resolves in few seconds.  Patient drinks 2 bottles of 64 ounces water every day.  She takes Lasix 40 daily.  Vitals:   08/08/23 0927  BP: 120/74  Pulse: 88  Resp: 16  SpO2: 97%     ROS:  Review of Systems  Cardiovascular:  Negative for chest pain, dyspnea on exertion, leg swelling, palpitations and syncope.  Neurological:  Positive for light-headedness.     Studies Reviewed: Marland Kitchen    EKG 08/08/2023: Atrial fibrillation 73 bpm Low voltage QRS Possible Inferior infarct , age undetermined Cannot rule out Anteroseptal infarct , age undetermined When compared with ECG of 06-Oct-2022 23:40, Atrial fibrillation has replaced Sinus rhythm   Independently interpreted 06/2023: Cr 1.97   Risk Assessment/Calculations:    CHA2DS2-VASc Score = 4  This indicates a 4.8% annual risk of stroke. [name] score is based upon: CHF History: 0 HTN History: 1 Diabetes History: 0 Stroke History: 0 Vascular Disease History: 0 Age Score: 2 Gender Score: 1      Physical Exam:   Physical Exam Vitals and nursing note reviewed.  Constitutional:      General: She is not in acute distress. Neck:     Vascular: No JVD.  Cardiovascular:      Rate and Rhythm: Normal rate. Rhythm irregular.     Heart sounds: Murmur heard.     High-pitched blowing holosystolic murmur is present with a grade of 2/6 at the apex.  Pulmonary:     Effort: Pulmonary effort is normal.     Breath sounds: Normal breath sounds. No wheezing or rales.  Musculoskeletal:     Right lower leg: No edema.     Left lower leg: No edema.      VISIT DIAGNOSES:   ICD-10-CM   1. PAC (premature atrial contraction)  I49.1 EKG 12-Lead    apixaban (ELIQUIS) 2.5 MG TABS tablet    ECHOCARDIOGRAM COMPLETE    2. Atrial fibrillation, unspecified type (HCC)  I48.91 apixaban (ELIQUIS) 2.5 MG TABS tablet    ECHOCARDIOGRAM COMPLETE       ASSESSMENT AND PLAN: .    Linda Rice is a 83 y.o. female with hypertension, hyperlipidemia, asthma, hypothyroidism, frequent PACs/MAT, CKD IV, now with new diagnosis Afib   Atrial fibrillation: New diagnosis, rate controlled on metoprolol tartrate 25 mg twice daily. Previously, she was only found to have episode of A-fib in the setting of severe hypomagnesemia during her trip to [place]. At this time, I will term this paroxysmal A-fib.  However, it is quite likely that it could well be persistent. CHA2DS2-VASc Score = 4. This indicates a 4.8% annual risk of stroke. Given age >87 years old, creatinine >1.5, started Eliquis 2.5 mg twice daily.  Check echocardiogram. Follow-up in 4 weeks.  If she remains in A-fib, could consider cardioversion after at least 4 to 6 weeks of uninterrupted anticoagulation.  Hypertension: Well controlled.   CKD: Continue follow-up with nephrology.    Lightheadedness: Instead of taking daily, she can take Lasix 40 mg only as needed, in case of leg     Meds ordered this encounter  Medications   apixaban (ELIQUIS) 2.5 MG TABS tablet    Sig: Take 1 tablet (2.5 mg total) by mouth 2 (two) times daily.    Dispense:  180 tablet    Refill:  3   apixaban (ELIQUIS) 2.5 MG TABS tablet    Sig: Take 1  tablet (2.5 mg total) by mouth 2 (two) times daily.    Dispense:  56 tablet    Lot Number?:   [id number]    Manufacturer?:   [employer] [[id number]]             BRISTOL MYERS SQUIBB    Quantity:   56     F/u in 4-6 weeks  Signed, Elder Negus, MD

## 2023-08-09 ENCOUNTER — Ambulatory Visit: Payer: Self-pay | Admitting: Cardiology

## 2023-08-12 ENCOUNTER — Emergency Department (HOSPITAL_BASED_OUTPATIENT_CLINIC_OR_DEPARTMENT_OTHER)

## 2023-08-12 ENCOUNTER — Telehealth: Payer: Self-pay | Admitting: Cardiology

## 2023-08-12 ENCOUNTER — Other Ambulatory Visit: Payer: Self-pay

## 2023-08-12 ENCOUNTER — Emergency Department (HOSPITAL_BASED_OUTPATIENT_CLINIC_OR_DEPARTMENT_OTHER): Admission: EM | Admit: 2023-08-12 | Discharge: 2023-08-12 | Disposition: A | Discharge status: Home / Self Care

## 2023-08-12 ENCOUNTER — Encounter: Payer: Self-pay | Admitting: Cardiology

## 2023-08-12 ENCOUNTER — Encounter (HOSPITAL_BASED_OUTPATIENT_CLINIC_OR_DEPARTMENT_OTHER): Payer: Self-pay | Admitting: Emergency Medicine

## 2023-08-12 DIAGNOSIS — J45909 Unspecified asthma, uncomplicated: Secondary | ICD-10-CM | POA: Insufficient documentation

## 2023-08-12 DIAGNOSIS — R0789 Other chest pain: Secondary | ICD-10-CM

## 2023-08-12 DIAGNOSIS — N184 Chronic kidney disease, stage 4 (severe): Secondary | ICD-10-CM | POA: Insufficient documentation

## 2023-08-12 DIAGNOSIS — I4891 Unspecified atrial fibrillation: Secondary | ICD-10-CM | POA: Diagnosis not present

## 2023-08-12 DIAGNOSIS — R079 Chest pain, unspecified: Secondary | ICD-10-CM | POA: Diagnosis present

## 2023-08-12 DIAGNOSIS — I129 Hypertensive chronic kidney disease with stage 1 through stage 4 chronic kidney disease, or unspecified chronic kidney disease: Secondary | ICD-10-CM | POA: Diagnosis not present

## 2023-08-12 DIAGNOSIS — E039 Hypothyroidism, unspecified: Secondary | ICD-10-CM | POA: Insufficient documentation

## 2023-08-12 DIAGNOSIS — Z91010 Allergy to peanuts: Secondary | ICD-10-CM | POA: Diagnosis not present

## 2023-08-12 DIAGNOSIS — Z79899 Other long term (current) drug therapy: Secondary | ICD-10-CM | POA: Insufficient documentation

## 2023-08-12 DIAGNOSIS — Z7901 Long term (current) use of anticoagulants: Secondary | ICD-10-CM | POA: Insufficient documentation

## 2023-08-12 DIAGNOSIS — Z91040 Latex allergy status: Secondary | ICD-10-CM | POA: Diagnosis not present

## 2023-08-12 DIAGNOSIS — J90 Pleural effusion, not elsewhere classified: Secondary | ICD-10-CM | POA: Diagnosis not present

## 2023-08-12 LAB — BASIC METABOLIC PANEL
Anion gap: 11 (ref 5–15)
BUN: 43 mg/dL — ABNORMAL HIGH (ref 8–23)
CO2: 22 mmol/L (ref 22–32)
Calcium: 8.7 mg/dL — ABNORMAL LOW (ref 8.9–10.3)
Chloride: 105 mmol/L (ref 98–111)
Creatinine, Ser: 1.88 mg/dL — ABNORMAL HIGH (ref 0.44–1.00)
GFR, Estimated: 27 mL/min — ABNORMAL LOW (ref >60)
Glucose, Bld: 91 mg/dL (ref 70–99)
Potassium: 3.9 mmol/L (ref 3.5–5.1)
Sodium: 138 mmol/L (ref 135–145)

## 2023-08-12 LAB — CBC
HCT: 36.6 % (ref 36.0–46.0)
Hemoglobin: 11.9 g/dL — ABNORMAL LOW (ref 12.0–15.0)
MCH: 30.5 pg (ref 26.0–34.0)
MCHC: 32.5 g/dL (ref 30.0–36.0)
MCV: 93.8 fL (ref 80.0–100.0)
Platelets: 292 10*3/uL (ref 150–400)
RBC: 3.9 MIL/uL (ref 3.87–5.11)
RDW: 14 % (ref 11.5–15.5)
WBC: 8 10*3/uL (ref 4.0–10.5)
nRBC: 0 % (ref 0.0–0.2)

## 2023-08-12 LAB — TROPONIN I (HIGH SENSITIVITY): Troponin I (High Sensitivity): 7 ng/L (ref <18)

## 2023-08-12 NOTE — ED Notes (Signed)
 All eval per Arline Asp Imir Brumbach RN

## 2023-08-12 NOTE — ED Provider Notes (Signed)
 [facility] Provider Note   CSN: [id number] Arrival date & time: 08/12/23  1253     History  Chief Complaint  Patient presents with   Chest Pain    Linda Rice is a 83 y.o. female.  This is an 83 year old female history of A-fib presenting emergency department for chest pain.  Symptoms started last night, reports she did not sleep well.  Left-sided.  No nausea vomiting shortness of breath.  She is taking Eliquis that she started this past Friday for her A-fib.  She states that she is quite anxious about her heart.  Also noted that she had some dark-colored urine this morning.   Chest Pain      Home Medications Prior to Admission medications   Medication Sig Start Date End Date Taking? Authorizing Provider  albuterol (VENTOLIN HFA) 108 (90 Base) MCG/ACT inhaler Inhale 1-2 puffs into the lungs every 6 (six) hours as needed for wheezing or shortness of breath. 11/17/19   Ambs, Norvel Richards, FNP  apixaban (ELIQUIS) 2.5 MG TABS tablet Take 1 tablet (2.5 mg total) by mouth 2 (two) times daily. 08/08/23   Patwardhan, Anabel Bene, MD  apixaban (ELIQUIS) 2.5 MG TABS tablet Take 1 tablet (2.5 mg total) by mouth 2 (two) times daily. 08/08/23   Patwardhan, Anabel Bene, MD  atorvastatin (LIPITOR) 10 MG tablet Take 10 mg by mouth in the morning.    [provider]  Calcium Carb-Cholecalciferol (CALCIUM 500 + D PO) Take 1 tablet by mouth daily.    [provider]  cetirizine (ZYRTEC) 10 MG tablet Take 10 mg by mouth at bedtime.    [provider]  Darbepoetin Alfa (ARANESP) 40 MCG/0.4ML SOSY injection Inject 40 mcg into the skin every 28 (twenty-eight) days. Patient not taking: Reported on 08/08/2023    [provider]  doxycycline (VIBRAMYCIN) 100 MG capsule Take 100 mg by mouth 2 (two) times daily. 06/17/15   [provider]  EPINEPHrine 0.3 mg/0.3 mL IJ SOAJ injection Inject 0.3 mLs (0.3 mg total) into the muscle  as needed for anaphylaxis. 11/17/19   Hetty Blend, FNP  furosemide (LASIX) 40 MG tablet Take 40 mg by mouth daily. Take as needed for swelling. 10/01/22   [provider]  Levothyroxine Sodium 88 MCG CAPS Take 88 mcg by mouth daily before breakfast.    [provider]  Magnesium 500 MG TABS Take 500 mg by mouth daily.    [provider]  metoprolol tartrate (LOPRESSOR) 25 MG tablet Take 1 tablet (25 mg total) by mouth 2 (two) times daily. 10/25/22 10/20/23  Patwardhan, Anabel Bene, MD  mirtazapine (REMERON) 15 MG tablet Take 1 tablet by mouth at bedtime. 08/02/23 08/01/24  [provider]  Multiple Vitamins-Minerals (MULTIVITAMIN WITH MINERALS) tablet Take 1 tablet by mouth daily. Woman    [provider]  sodium bicarbonate 650 MG tablet Take 1,300 mg by mouth 2 (two) times daily. 03/28/21   [provider]      Allergies    Egg-derived products, Erythromycin, Fish allergy, Gatifloxacin, Other, Peanut-containing drug products, Poultry meal, Banana, Keflex [cephalexin], and Latex    Review of Systems   Review of Systems  Cardiovascular:  Positive for chest pain.    Physical Exam Updated Vital Signs BP 139/89   Pulse 68   Temp 97.8 F (36.6 C)   Resp 15   SpO2 100%  Physical Exam Vitals and nursing note reviewed.  Constitutional:  General: She is not in acute distress.    Appearance: She is not toxic-appearing.  Cardiovascular:     Rate and Rhythm: Normal rate and regular rhythm.     Heart sounds: Normal heart sounds.  Pulmonary:     Effort: Pulmonary effort is normal.  Abdominal:     Palpations: Abdomen is soft.  Musculoskeletal:     Right lower leg: No edema.     Left lower leg: No edema.  Skin:    General: Skin is warm.  Neurological:     General: No focal deficit present.     Mental Status: She is alert and oriented to person, place, and time.  Psychiatric:        Mood and Affect: Mood is anxious.        Behavior:  Behavior normal.     ED Results / Procedures / Treatments   Labs (all labs ordered are listed, but only abnormal results are displayed) Labs Reviewed  CBC - Abnormal; Notable for the following components:      Result Value   Hemoglobin 11.9 (*)    All other components within normal limits  BASIC METABOLIC PANEL - Abnormal; Notable for the following components:   BUN 43 (*)    Creatinine, Ser 1.88 (*)    Calcium 8.7 (*)    GFR, Estimated 27 (*)    All other components within normal limits  TROPONIN I (HIGH SENSITIVITY)    EKG EKG Interpretation Date/Time:  Monday August 12 2023 13:08:52 EST Ventricular Rate:  75 PR Interval:    QRS Duration:  68 QT Interval:  336 QTC Calculation: 375 R Axis:   20  Text Interpretation: Atrial flutter with variable A-V block Low voltage QRS Cannot rule out Anteroseptal infarct , age undetermined Abnormal ECG When compared with ECG of 08-Aug-2023 09:31, PREVIOUS ECG IS PRESENT Confirmed by Estanislado Pandy [phone]) on 08/12/2023 1:48:30 PM  Radiology No results found.  Procedures Procedures    Medications Ordered in ED Medications - No data to display  ED Course/ Medical Decision Making/ A&P Clinical Course as of 08/12/23 1503  Mon Aug 12, 2023  1337 Saw cardiology 08/08/23: "Linda Rice is a 83 y.o. female with hypertension, hyperlipidemia, asthma, hypothyroidism, frequent PACs/MAT, CKD IV, now with new diagnosis Afib   Atrial fibrillation: New diagnosis, rate controlled on metoprolol tartrate 25 mg twice daily. Previously, she was only found to have episode of A-fib in the setting of severe hypomagnesemia during her trip to [place]. At this time, I will term this paroxysmal A-fib.  However, it is quite likely that it could well be persistent. CHA2DS2-VASc Score = 4. This indicates a 4.8% annual risk of stroke. Given age >51 years old, creatinine >1.5, started Eliquis 2.5 mg twice daily. Check echocardiogram. Follow-up in 4 weeks.   If she remains in A-fib, could consider cardioversion after at least 4 to 6 weeks of uninterrupted anticoagulation.   Hypertension: Well controlled.   CKD: Continue follow-up with nephrology.     Lightheadedness: Instead of taking daily, she can take Lasix 40 mg only as needed, in case of leg " [TY]    Clinical Course User Index [TY] Coral Spikes, DO                                 Medical Decision Making This is a well-appearing 82 year old female presenting emergency department for chest pain.  She  is afebrile nontachycardic, slightly hypertensive.  Maintaining oxygen saturation on room air.  On my independent interpretation EKG appears to be she is atrial fibrillation/flutter.  I do not appreciate ST segment changes that would indicate ischemia.  Troponin negative at 7; symptoms ongoing since last night.  Cardiac etiology unlikely.  Low risk for PE; also on Eliquis.  Chest x-ray without pneumonia pneumothorax on my independent interpretation, however awaiting radiology final read.  No significant metabolic derangements.  Kidney function appears improved compared to prior.  She is anemic, but appears to be at her baseline per prior lab results.  Daughter at bedside notes that mother is quite anxious about her A-fib.  If chest x-ray negative, feel that she is appropriate for discharge to follow-up outpatient.  Amount and/or Complexity of Data Reviewed Labs: ordered. Radiology: ordered.  Risk Decision regarding hospitalization.         Final Clinical Impression(s) / ED Diagnoses Final diagnoses:  None    Rx / DC Orders ED Discharge Orders     None         Coral Spikes, DO 08/12/23 1503

## 2023-08-12 NOTE — Telephone Encounter (Signed)
 Spoke with pt who reports active chest discomfort through the night on the left side of her chest to center.  Pt also reports some mild nausea and has not been able to eat or drink today.  She states she was seen by the clinic where she resides and BP is 126/86 with HR - 86.  Pt states she is just not feeling like herself.  She did start Eliquis 2.5mg  - 1 tablet by mouth twice daily. Pt advised with her current symptoms and active chest pain would recommend she be further evaluated in the ED.  Encouraged pt not to drive herself.  Pt verbalizes understanding and agrees with current plan.

## 2023-08-12 NOTE — ED Triage Notes (Signed)
 Cp that started  last night no n/v/sob new dx of atrial fib started  bloodthinners  , hx of Chronic kidney and her urine is concentrated and dark has external bladder

## 2023-08-12 NOTE — Telephone Encounter (Signed)
 Patient calling in about this discomfort she feels on the left side of chest. Please advise

## 2023-08-12 NOTE — ED Provider Notes (Signed)
 F/U CXR anticipate d/c. Physical Exam  BP 139/89   Pulse 68   Temp 97.8 F (36.6 C)   Resp 15   SpO2 100%   Physical Exam  Procedures  Procedures  ED Course / MDM   Clinical Course as of 08/12/23 1523  Mon Aug 12, 2023  1337 Saw cardiology 08/08/23: "Linda Rice is a 83 y.o. female with hypertension, hyperlipidemia, asthma, hypothyroidism, frequent PACs/MAT, CKD IV, now with new diagnosis Afib   Atrial fibrillation: New diagnosis, rate controlled on metoprolol tartrate 25 mg twice daily. Previously, she was only found to have episode of A-fib in the setting of severe hypomagnesemia during her trip to [place]. At this time, I will term this paroxysmal A-fib.  However, it is quite likely that it could well be persistent. CHA2DS2-VASc Score = 4. This indicates a 4.8% annual risk of stroke. Given age >44 years old, creatinine >1.5, started Eliquis 2.5 mg twice daily. Check echocardiogram. Follow-up in 4 weeks.  If she remains in A-fib, could consider cardioversion after at least 4 to 6 weeks of uninterrupted anticoagulation.   Hypertension: Well controlled.   CKD: Continue follow-up with nephrology.     Lightheadedness: Instead of taking daily, she can take Lasix 40 mg only as needed, in case of leg " [TY]    Clinical Course User Index [TY] Coral Spikes, DO   Medical Decision Making Amount and/or Complexity of Data Reviewed Labs: ordered. Radiology: ordered.   Chest x-ray personally reviewed by myself.  I have reviewed the radiology interpreted Eischen.  Radiology suggests a small left pleural effusion and a associated consolidation.  I have reviewed the patient's history of present illness.  She is anticoagulated on Eliquis.  She reports pain started just last night.  Will proceed with a noncontrast CT scan to further define consolidation and extent of effusion.  I have personally reviewed the CT scan.  There is very small pleural effusion and slight amount of  filtrate versus scarring appearance at the base of the lung.  At this current time with read delays for radiology, I have reviewed a plan with the patient for discharge and following up on definitive radiology interpreted results.  By my evaluation, I do not see indication that the patient would require admission.  There is somewhat limited appearance of infiltrate at the base of the lung.  This is equivocal whether it represents pneumonia or possibly older scarring given the patient's symptoms.  She does not have fever or productive cough and symptoms just started last night.  Patient is empirically on prophylactic doxycycline twice daily due to distant history of osteomyelitis.  Patient will continue to take the doxycycline as she has been.  Patient is also currently taking Eliquis for A-fib.  I feel that lower probability that she has any significant or large PE.  Patient is stable without any hypoxia, tachycardia or hypotension.  Troponin is not elevated.  I do feel that if patient had small PE, she would be appropriate candidate for continued oral Eliquis which she recently started for atrial fibrillation.  Patient has follow-up scheduled cardiology within the next 2 days.  Troponin is negative and EKG does not show acute ischemic pattern present we did review careful return precautions.  Patient voices understanding.       Arby Barrette, MD 08/12/23 [phone]

## 2023-08-12 NOTE — Telephone Encounter (Signed)
 Happy to see her tomorrow or day after, f needed. I saw troponin was normal.   Thanks MJP

## 2023-08-12 NOTE — Discharge Instructions (Addendum)
 As discussed, please follow-up with your primary doctor and your cardiologist.  Return immediately for fevers, chills, lightheadedness, passout, feel your heart is racing, worsening chest pain, shortness of breath, nausea vomiting or any new or worsening symptoms that are concerning to you.  You have a very small pleural effusion.  Reviewed this with your primary doctor and also schedule a follow-up with your infectious disease specialist.  You are taking doxycycline twice daily.  At this time that antibiotic may treat if there is infection associated with this.  Currently you are not exhibiting signs of infection such as fevers, cough or other generalized symptoms.  Return for recheck if you feel that you are getting more ill or generally weak.  Also return for shortness of breath or worsening changing pain.

## 2023-08-12 NOTE — Telephone Encounter (Signed)
 Agree with ER evaluation. I will keep her on my radar.  Thanks MJP

## 2023-08-13 IMAGING — CT CT ABD-PELV W/O CM
1 of 2 series · 13 of 32 positions shown, 18 images · non-contrast
Comparison: None

CLINICAL DATA: 9 pound weight loss since [REDACTED] normal nausea,
fatigue, decreased appetite, history hypertension, arthritis, former
smoker

EXAM:
CT ABDOMEN AND PELVIS WITHOUT CONTRAST
TECHNIQUE: Multidetector CT imaging of the abdomen and pelvis was performed
following the standard protocol without IV contrast. Sagittal and
coronal MPR images reconstructed from axial data set. Patient drank
dilute oral contrast for exam.

[Series 2: abd/pelvis w/(date) · axial · 0.76mm/px · z∈[-410,-45]mm · 13 of 83 slices shown, 18 images]
[im 5/83  soft-tissue]
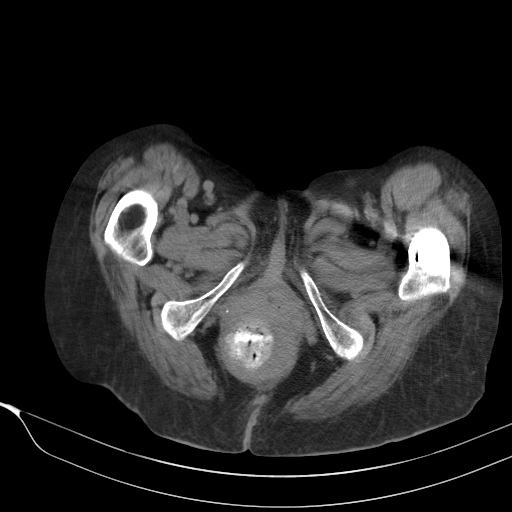
[im 5/83  bone]
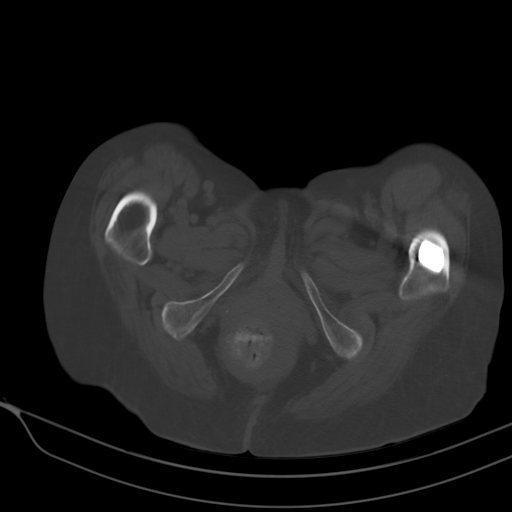
[im 13/83  soft-tissue]
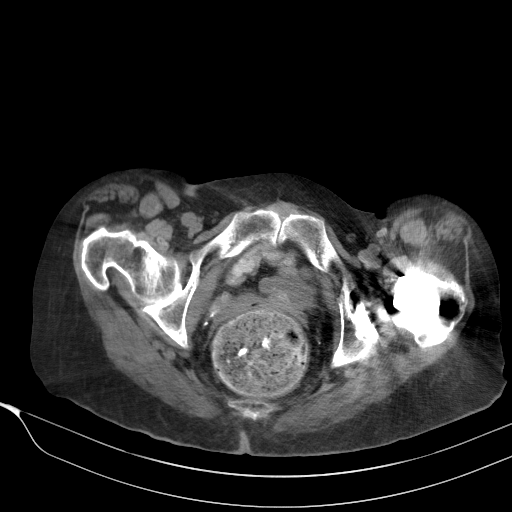
[im 17/83  soft-tissue]
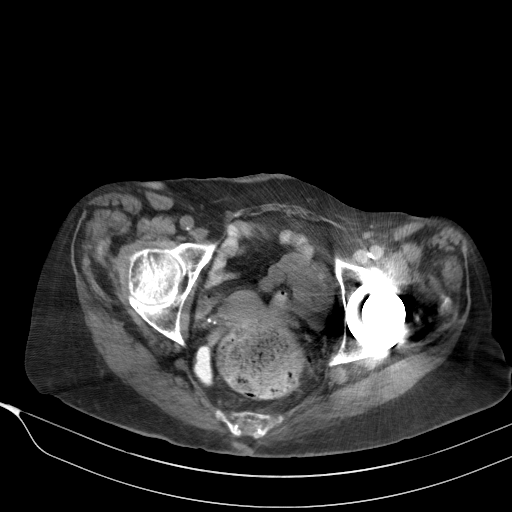
[im 25/83  soft-tissue]
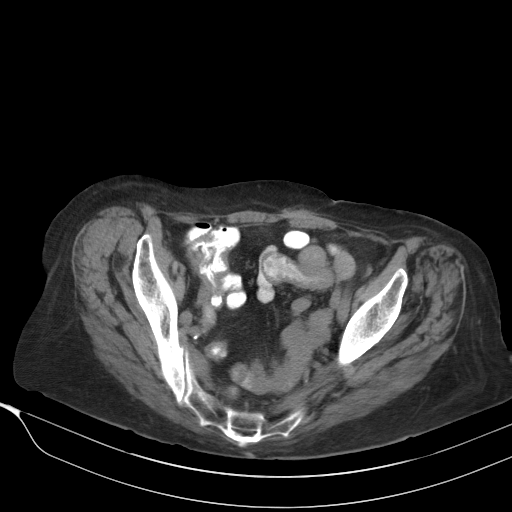
[im 33/83  soft-tissue]
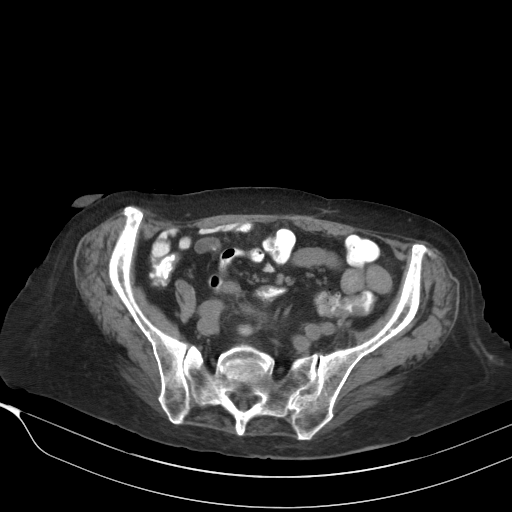
[im 37/83  soft-tissue]
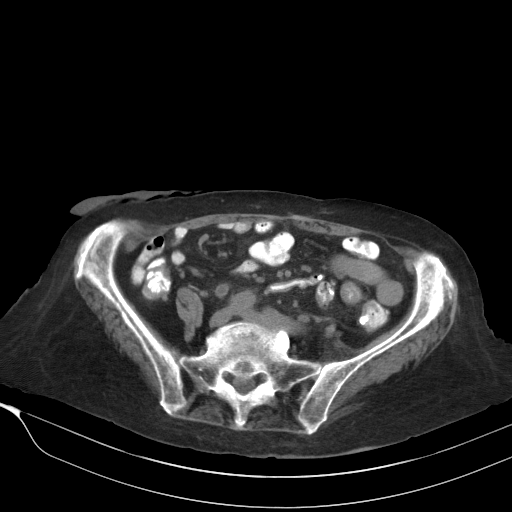
[im 46/83  soft-tissue]
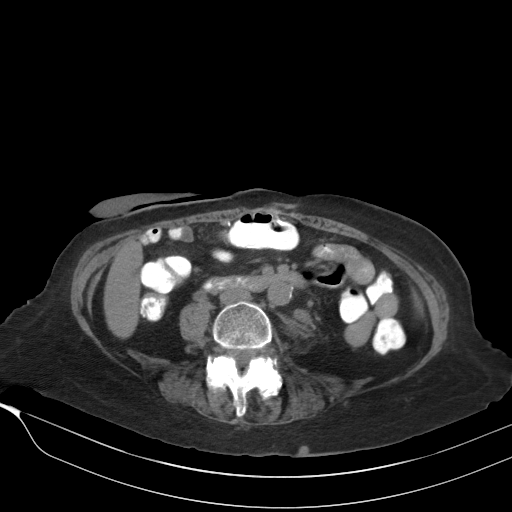
[im 50/83  soft-tissue]
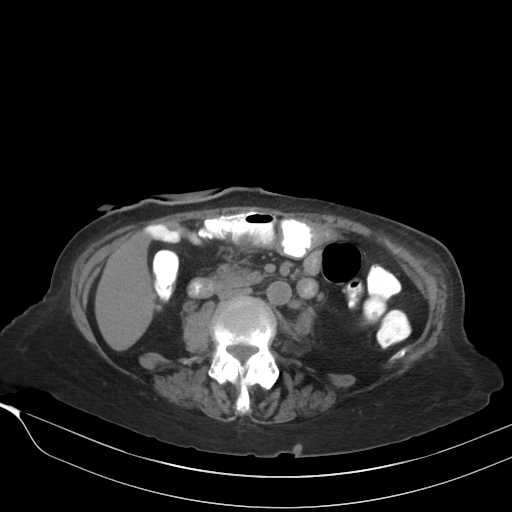
[im 58/83  soft-tissue]
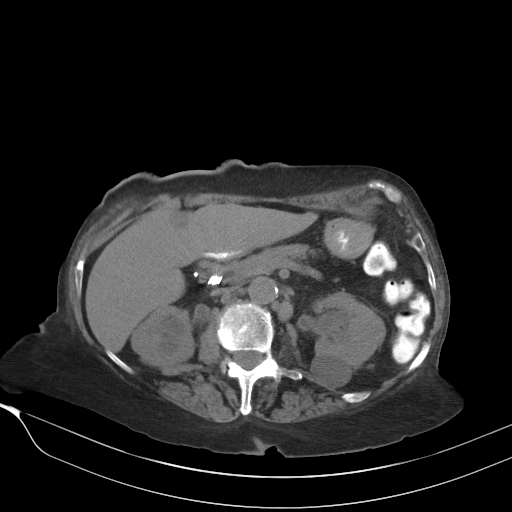
[im 58/83  bone]
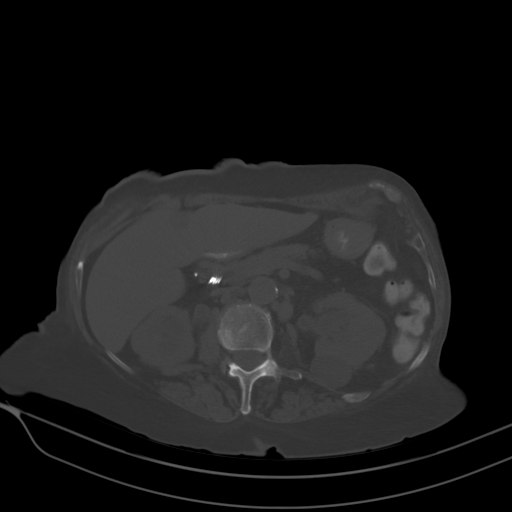
[im 66/83  soft-tissue]
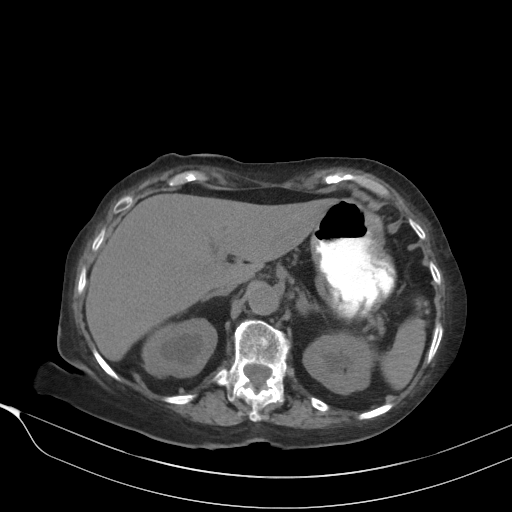
[im 66/83  lung]
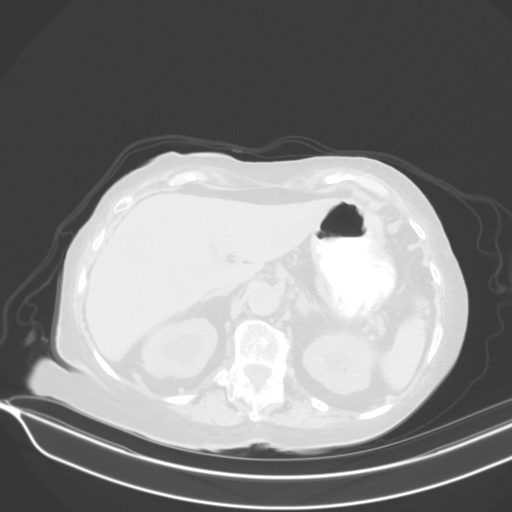
[im 70/83  soft-tissue]
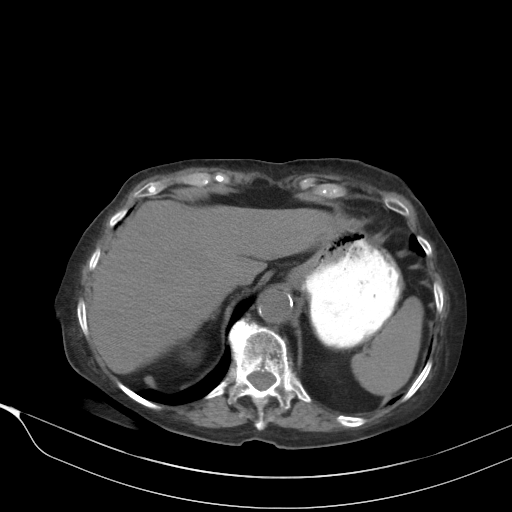
[im 70/83  lung]
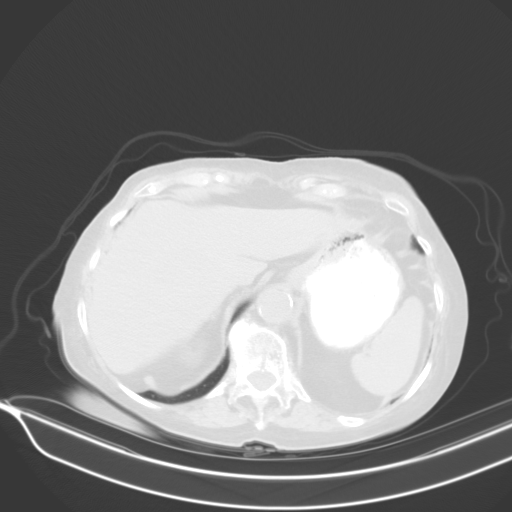
[im 74/83  lung]
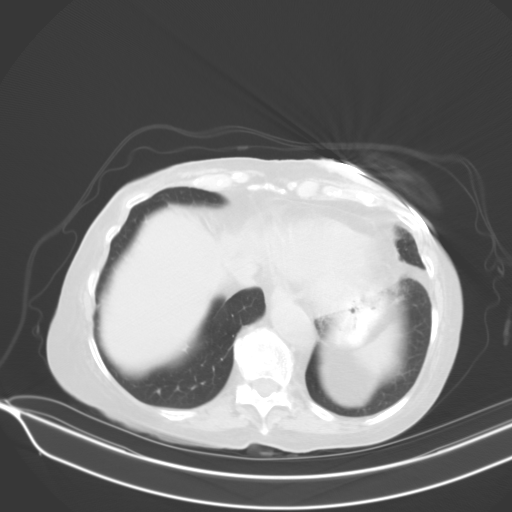
[im 78/83  soft-tissue]
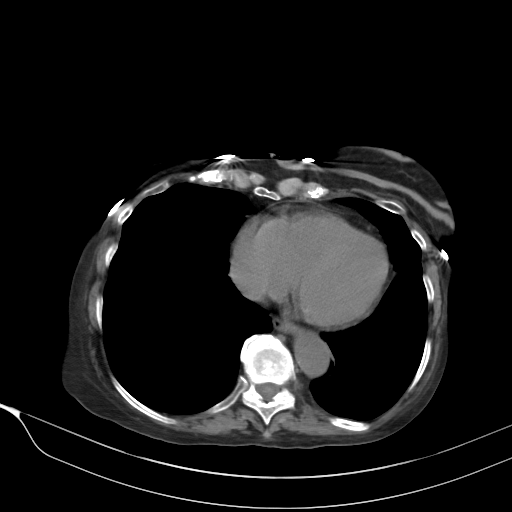
[im 78/83  lung]
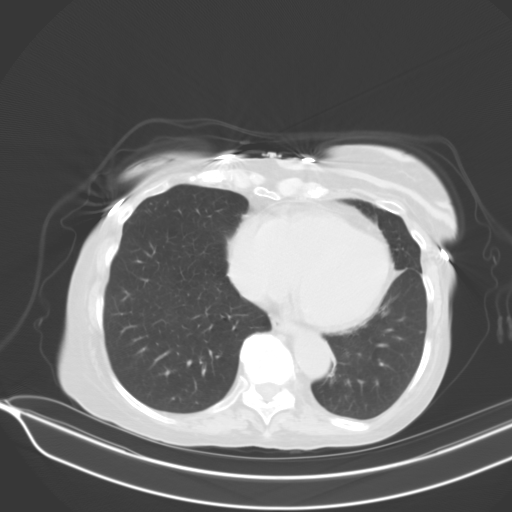

[13 of 32 positions shown; findings below may reference images not displayed]

FINDINGS: Lower chest: Lung bases appear emphysematous but clear.

Hepatobiliary: Suspected focal fatty infiltration of liver adjacent
to falciform fissure. Gallbladder surgically absent. Liver otherwise
unremarkable.

Pancreas: Normal appearance

Spleen: Normal appearance

Adrenals/Urinary Tract: Adrenal glands unremarkable. Exophytic cyst
at posterior LEFT kidney 3.2 x 2.3 cm image 25. BILATERAL
hydronephrosis and hydroureter extending to urinary diversion RIGHT
abdomen.

Stomach/Bowel: Appendix surgically absent by history. Distal colonic
diverticulosis without evidence of diverticulitis. Wall thickening
of gastric antrum and pylorus versus artifact from underdistention.
Small focus of fat within the wall of the distal gastric antrum, 10
x 7 x 5 mm consistent with a submucosal lipoma. Increased stool in
rectum. Questionable wall thickening of distal rectum/anus.
Remaining bowel loops unremarkable.

Vascular/Lymphatic: Atherosclerotic calcification aorta without
aneurysm. No adenopathy.

Reproductive: Atrophic uterus with unremarkable adnexa

Other: No free air or free fluid.  No hernia.

Musculoskeletal: Osseous demineralization. LEFT hip prosthesis. Mild
degenerative disc and facet disease changes lumbar spine.
IMPRESSION: BILATERAL hydronephrosis and hydroureter extending to urinary
diversion RIGHT abdomen.

Distal colonic diverticulosis without evidence of diverticulitis.

Small submucosal lipoma at distal gastric antrum, 10 x 7 x 5 mm.

Suboptimal assessment of gastric wall thickness due to
underdistention, with portions of the antrum and pylorus
demonstrating questionable wall thickening, consider assessment by
upper endoscopy or upper GI exam.

Increased stool in rectum with questionable anorectal wall
thickening, recommend correlation with physical exam.

Aortic Atherosclerosis (2SFU9-4H7.7) and Emphysema (2SFU9-N6I.Q).

## 2023-08-14 ENCOUNTER — Ambulatory Visit: Attending: Cardiology | Admitting: Cardiology

## 2023-08-14 ENCOUNTER — Encounter: Payer: Self-pay | Admitting: Cardiology

## 2023-08-14 VITALS — BP 106/74 | HR 79 | Ht 64.0 in | Wt 148.2 lb

## 2023-08-14 DIAGNOSIS — I480 Paroxysmal atrial fibrillation: Secondary | ICD-10-CM | POA: Insufficient documentation

## 2023-08-14 DIAGNOSIS — R072 Precordial pain: Secondary | ICD-10-CM | POA: Insufficient documentation

## 2023-08-14 NOTE — Patient Instructions (Signed)
 Medication Instructions:   Your physician recommends that you continue on your current medications as directed. Please refer to the Current Medication list given to you today.  *If you need a refill on your cardiac medications before your next appointment, please call your pharmacy*    Follow-Up:  As scheduled in April   Other Instructions    1st Floor: - Lobby - Registration  - Pharmacy  - Lab - Cafe  2nd Floor: - PV Lab - Diagnostic Testing (echo, CT, nuclear med)  3rd Floor: - Vacant  4th Floor: - TCTS (cardiothoracic surgery) - AFib Clinic - Structural Heart Clinic - Vascular Surgery  - Vascular Ultrasound  5th Floor: - [facility] (general and EP) - Clinical Pharmacy for coumadin, hypertension, lipid, weight-loss medications, and med management appointments    Valet parking services will be available as well.

## 2023-08-14 NOTE — Progress Notes (Signed)
 Cardiology Office Note:  .   Date:  08/14/2023  ID:  Linda Rice, DOB 22-Jul-1940, MRN [id number] PCP: Zoila Shutter, MD  [facility] Providers Cardiologist:  Truett Mainland, MD PCP: Zoila Shutter, MD  Chief Complaint  Patient presents with   Chest pain      History of Present Illness: .    Linda Rice is a 83 y.o. female with hypertension, hyperlipidemia, CKD 4, asthma, hypothyroidism, PAF, presence of urostomy bag  Since her last visit with me only a few days ago, patient had an episode of chest pain. She went to ER.  EKG did not show ischemia, high sensitive troponin x 1 was normal.  Chest Xray showed small left pleural effusion.  This was further confirmed on CT chest that showed trace bilateral effusions, along with bronchial wall thickening and mucous plugging in the left lower lobe with peribronchovascular atelectasis/consolidation.  This was thought to be due to bronchopneumonia and/or aspiration.   Patient is doing well today, denies any shortness of breath, fever, chills.  There were no vitals filed for this visit.    ROS:  Review of Systems  Cardiovascular:  Negative for chest pain, dyspnea on exertion, leg swelling, palpitations and syncope.  Neurological:  Positive for light-headedness.     Studies Reviewed: Marland Kitchen    EKG 08/14/2023: Atrial fibrillation Low voltage QRS Cannot rule out Anteroseptal infarct (cited on or before 08-Aug-2023) When compared with ECG of 12-Aug-2023 13:08,  Atrial fibrillation has replaced Atrial flutter    EKG 08/08/2023: Atrial fibrillation 73 bpm Low voltage QRS Possible Inferior infarct , age undetermined Cannot rule out Anteroseptal infarct , age undetermined When compared with ECG of 06-Oct-2022 23:40, Atrial fibrillation has replaced Sinus rhythm   Independently interpreted 06/2023: Cr 1.97  CT chest 08/12/2023: 1. Bronchial wall thickening and mucous plugging in the left lower lobe with  peribronchovascular atelectasis/consolidation. This may be due to bronchopneumonia and/or aspiration. 2. Trace bilateral pleural effusions. 3. Aortic Atherosclerosis (ICD10-I70.0).     Risk Assessment/Calculations:    CHA2DS2-VASc Score = 4  This indicates a 4.8% annual risk of stroke. [name] score is based upon: CHF History: 0 HTN History: 1 Diabetes History: 0 Stroke History: 0 Vascular Disease History: 0 Age Score: 2 Gender Score: 1      Physical Exam:   Physical Exam Vitals and nursing note reviewed.  Constitutional:      General: She is not in acute distress. Neck:     Vascular: No JVD.  Cardiovascular:     Rate and Rhythm: Normal rate. Rhythm irregular.     Heart sounds: Murmur heard.     High-pitched blowing holosystolic murmur is present with a grade of 2/6 at the apex.  Pulmonary:     Effort: Pulmonary effort is normal.     Breath sounds: Normal breath sounds. No wheezing or rales.  Musculoskeletal:     Right lower leg: No edema.     Left lower leg: No edema.      VISIT DIAGNOSES:   ICD-10-CM   1. Paroxysmal atrial fibrillation (HCC)  I48.0 EKG 12-Lead    2. Precordial pain  R07.2         ASSESSMENT AND PLAN: .    [name] is a 83 y.o. female with hypertension, hyperlipidemia, CKD 4, asthma, hypothyroidism, PAF, presence of urostomy bag   Chest pain: Resolved.  CT chest showed possible bronchopneumonia/aspiration, bilateral trace pleural effusions.  She does  not look toxic today.  She is afebrile, normal oxygen saturation.  I do not think she needs additional antibiotics.  It appears that she is on suppression doxycycline since osteomyelitis treatment in 2004.  Okay to continue the same.  If she were to get any worsening breathing, fever, she knows to contact her PCP.  PAF: Rate controlled on metoprolol tartrate 25 mg twice daily. CHA2DS2-VASc Score = 4. This indicates a 4.8% annual risk of stroke. Given age >43 years old,  creatinine >1.5, continue Eliquis 2.5 mg twice daily. Echocardiogram pending.  Keep follow-up with them for the schedule for   Signed, Elder Negus, MD

## 2023-08-27 ENCOUNTER — Ambulatory Visit (HOSPITAL_COMMUNITY): Payer: Medicare Other | Attending: Cardiovascular Disease

## 2023-08-27 ENCOUNTER — Encounter: Payer: Self-pay | Admitting: Cardiology

## 2023-08-27 DIAGNOSIS — I4891 Unspecified atrial fibrillation: Secondary | ICD-10-CM | POA: Diagnosis not present

## 2023-08-27 DIAGNOSIS — I491 Atrial premature depolarization: Secondary | ICD-10-CM | POA: Insufficient documentation

## 2023-08-27 DIAGNOSIS — I480 Paroxysmal atrial fibrillation: Secondary | ICD-10-CM | POA: Diagnosis present

## 2023-08-27 LAB — ECHOCARDIOGRAM COMPLETE
AR max vel: 1.9 cm2
AV Area VTI: 1.81 cm2
AV Area mean vel: 1.84 cm2
AV Mean grad: 8 mmHg
AV Peak grad: 14.1 mmHg
Ao pk vel: 1.88 m/s
Area-P 1/2: 3.53 cm2
S' Lateral: 2.1 cm

## 2023-09-16 ENCOUNTER — Ambulatory Visit: Payer: Medicare Other | Attending: Cardiology | Admitting: Cardiology

## 2023-09-16 ENCOUNTER — Encounter: Payer: Self-pay | Admitting: Cardiology

## 2023-09-16 ENCOUNTER — Other Ambulatory Visit: Payer: Self-pay

## 2023-09-16 VITALS — BP 111/68 | HR 91 | Resp 16 | Ht 64.0 in | Wt 142.4 lb

## 2023-09-16 DIAGNOSIS — I10 Essential (primary) hypertension: Secondary | ICD-10-CM | POA: Diagnosis present

## 2023-09-16 DIAGNOSIS — I4819 Other persistent atrial fibrillation: Secondary | ICD-10-CM | POA: Diagnosis present

## 2023-09-16 MED ORDER — APIXABAN 2.5 MG PO TABS: 2.5000 mg | ORAL_TABLET | Freq: Two times a day (BID) | ORAL | 3 refills | Status: DC

## 2023-09-16 MED ORDER — APIXABAN 2.5 MG PO TABS: 2.5000 mg | ORAL_TABLET | Freq: Two times a day (BID) | ORAL | 0 refills | Status: AC

## 2023-09-16 MED ORDER — METOPROLOL TARTRATE 25 MG PO TABS
25.0000 mg | ORAL_TABLET | Freq: Two times a day (BID) | ORAL | 3 refills | Status: AC
Start: 2023-09-16 — End: ?

## 2023-09-16 NOTE — Progress Notes (Signed)
 Cardiology Office Note:  .   Date:  09/16/2023  ID:  Linda Rice, DOB 08-22-1940, MRN [id number] PCP: Linda Shutter, MD  [facility] Providers Cardiologist:  Linda Mainland, MD PCP: Linda Shutter, MD  Chief Complaint  Patient presents with   Follow-up   Atrial Fibrillation      History of Present Illness: .    Linda Rice is a 83 y.o. female with hypertension, hyperlipidemia, persistent Afib, CKD 4, asthma, hypothyroidism, basal cell carcinoma  Patient is here today with her daughter.  She is doing well, does not have any particular cardiac complaints.  She has noticed that her urine is pinkish in color since starting Eliquis.  Hemoglobin is 11.9.  She has follow-up with her nephrologist Dr. Thedore Rice next week.  Vitals:   09/16/23 1055  BP: 111/68  Pulse: 91  Resp: 16  SpO2: 98%      ROS:  Review of Systems  Cardiovascular:  Negative for chest pain, dyspnea on exertion, leg swelling, palpitations and syncope.     Studies Reviewed: Marland Kitchen    EKG 08/08/2023: Atrial fibrillation 73 bpm Low voltage QRS Possible Inferior infarct , age undetermined Cannot rule out Anteroseptal infarct , age undetermined When compared with ECG of 06-Oct-2022 23:40, Atrial fibrillation has replaced Sinus rhythm   Independently interpreted 06/2023: Cr 1.97  Independently interpreted 08/2023: Hb 11.9 Cr 1.88, eGFR 27    Independently interpreted Echocardiogram 08/27/2023: EF 60 to 65%. Normal RV systolic function. Severe biatrial dilatation. Aortic sclerosis. Moderate tricuspid regurgitation.  Risk Assessment/Calculations:    CHA2DS2-VASc Score = 4  This indicates a 4.8% annual risk of stroke. [name] score is based upon: CHF History: 0 HTN History: 1 Diabetes History: 0 Stroke History: 0 Vascular Disease History: 0 Age Score: 2 Gender Score: 1    Physical Exam:   Physical Exam Vitals and nursing note reviewed.  Constitutional:       General: She is not in acute distress. Neck:     Vascular: No JVD.  Cardiovascular:     Rate and Rhythm: Normal rate. Rhythm irregular.     Heart sounds: Murmur heard.     High-pitched blowing holosystolic murmur is present with a grade of 2/6 at the apex.  Pulmonary:     Effort: Pulmonary effort is normal.     Breath sounds: Normal breath sounds. No wheezing or rales.  Musculoskeletal:     Right lower leg: No edema.     Left lower leg: No edema.      VISIT DIAGNOSES: No diagnosis found.    ASSESSMENT AND PLAN: .    Linda Rice is a 83 y.o. female with hypertension, hyperlipidemia, persistent Afib, CKD 4, s/p urostomy asthma, hypothyroidism, basal cell carcinoma   Persistent A-fib: No particular symptoms, rate controlled. Given severe biatrial dilatation, I do not think rhythm control is achievable or sustainable. Continue metoprolol tartrate 25 mg twice daily, Eliquis 2.5 mg twice daily. Continue follow-up with nephrology given change in urine color in the urostomy bag.  Hypertension: Well controlled.   CKD: Continue follow-up with nephrology.     Meds ordered this encounter  Medications   apixaban (ELIQUIS) 2.5 MG TABS tablet    Sig: Take 1 tablet (2.5 mg total) by mouth 2 (two) times daily.    Dispense:  180 tablet    Refill:  3    Lot Number?:   [id number]    Manufacturer?:   [employer] [[id number]]  BRISTOL MYERS SQUIBB    Quantity:   56   metoprolol tartrate (LOPRESSOR) 25 MG tablet    Sig: Take 1 tablet (25 mg total) by mouth 2 (two) times daily.    Dispense:  180 tablet    Refill:  3     F/u in 6 months  Signed, Linda Negus, MD

## 2023-09-16 NOTE — Patient Instructions (Signed)
 Medication Instructions:  Refilled prescriptions today  *If you need a refill on your cardiac medications before your next appointment, please call your pharmacy*  Follow-Up: At [facility], you and your health needs are our priority.  As part of our continuing mission to provide you with exceptional heart care, our providers are all part of one team.  This team includes your primary Cardiologist (physician) and Advanced Practice Providers or APPs (Physician Assistants and Nurse Practitioners) who all work together to provide you with the care you need, when you need it.  Your next appointment:   6 month(s)  Provider:   Elder Negus, MD     We recommend signing up for the patient portal called "MyChart".  Sign up information is provided on this After Visit Summary.  MyChart is used to connect with patients for Virtual Visits (Telemedicine).  Patients are able to view lab/test results, encounter notes, upcoming appointments, etc.  Non-urgent messages can be sent to your provider as well.   To learn more about what you can do with MyChart, go to [email or web address].   Other Instructions      1st Floor: - Lobby - Registration  - Pharmacy  - Lab - Cafe  2nd Floor: - PV Lab - Diagnostic Testing (echo, CT, nuclear med)  3rd Floor: - Vacant  4th Floor: - TCTS (cardiothoracic surgery) - AFib Clinic - Structural Heart Clinic - Vascular Surgery  - Vascular Ultrasound  5th Floor: - HeartCare Cardiology (general and EP) - Clinical Pharmacy for coumadin, hypertension, lipid, weight-loss medications, and med management appointments    Valet parking services will be available as well.

## 2023-09-17 ENCOUNTER — Other Ambulatory Visit (HOSPITAL_COMMUNITY): Payer: Self-pay

## 2023-09-17 ENCOUNTER — Telehealth: Payer: Self-pay

## 2023-09-17 DIAGNOSIS — I4819 Other persistent atrial fibrillation: Secondary | ICD-10-CM

## 2023-09-17 NOTE — Telephone Encounter (Signed)
 PAP application for (ELIQUIS, BMS) has been mailed to pt home.

## 2023-09-17 NOTE — Telephone Encounter (Signed)
-----   Message from Nurse Nehemiah Settle S sent at 09/16/2023 11:21 AM EDT ----- Regarding: eliquis Pt is needing to set up patient assistance for eliquis, please. Thank you!

## 2023-10-14 NOTE — Telephone Encounter (Signed)
 Spoke with patient, she isn't sure what would be needed to get her eliquis  from the [place] pharmacy but wanted to check into this due to pricing and being able to afford her eliquis . Patient will contact our office back if/when she needs anything moving forward. Instructed patient that we could provide a printed prescription to send to pharmacy if she decides to go that route.

## 2023-10-14 NOTE — Telephone Encounter (Signed)
 Please see patient's message and advise?

## 2023-10-21 MED ORDER — APIXABAN 2.5 MG PO TABS: 2.5000 mg | ORAL_TABLET | Freq: Two times a day (BID) | ORAL | 3 refills | Status: AC

## 2023-10-21 NOTE — Telephone Encounter (Signed)
 Okay to send paper prescription.  Thanks MJP

## 2023-10-21 NOTE — Telephone Encounter (Signed)
**Note De-identified  [place] Obfuscation** Please advise 

## 2023-10-21 NOTE — Telephone Encounter (Signed)
 Spoke to patient. Prescription has been printed and will be signed by Dr. Filiberto Hug, then mailed.

## 2023-10-21 NOTE — Addendum Note (Signed)
 Addended by: Miles Allan B on: 10/21/2023 05:30 PM   Modules accepted: Orders

## 2023-12-07 IMAGING — MG MM DIGITAL SCREENING BILAT W/ TOMO AND CAD
6 of 10 series · 6 of 30 positions shown · non-contrast
Comparison: Previous exam(s).

CLINICAL DATA: Screening.

EXAM:
DIGITAL SCREENING BILATERAL MAMMOGRAM WITH TOMOSYNTHESIS AND CAD
TECHNIQUE: Bilateral screening digital craniocaudal and mediolateral oblique
mammograms were obtained. Bilateral screening digital breast
tomosynthesis was performed. The images were evaluated with
computer-aided detection.

[L CC synth-2D]
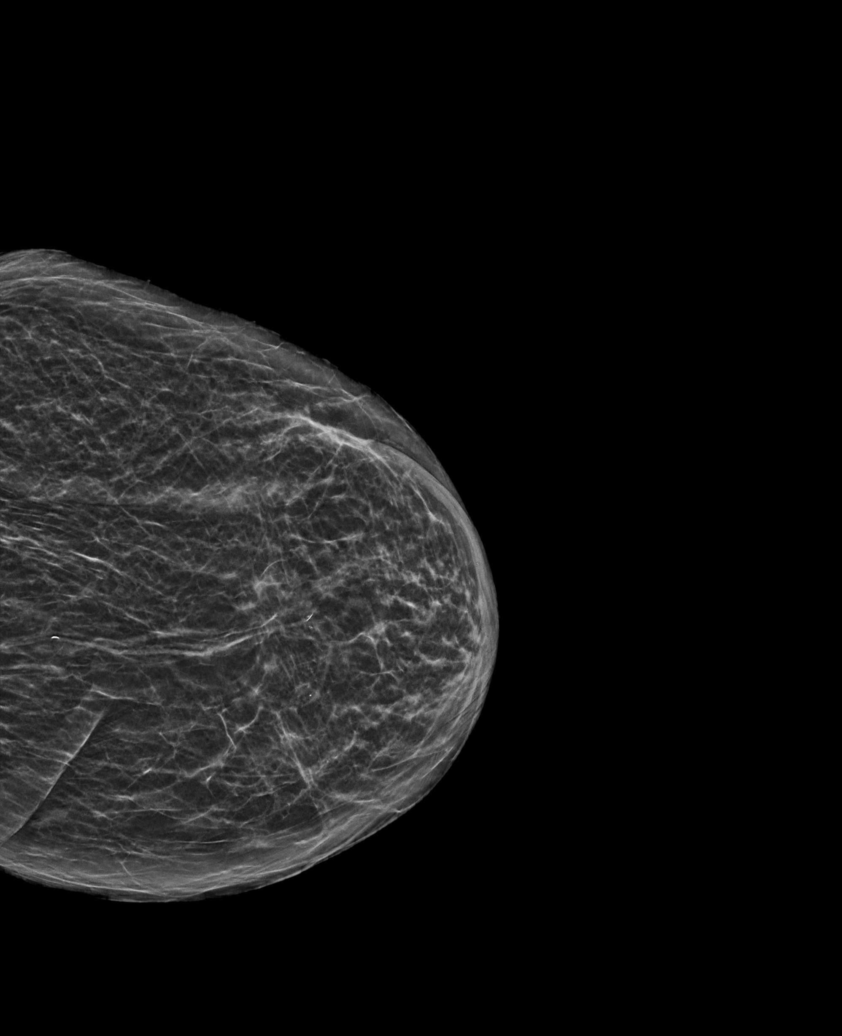

[L MLO synth-2D]
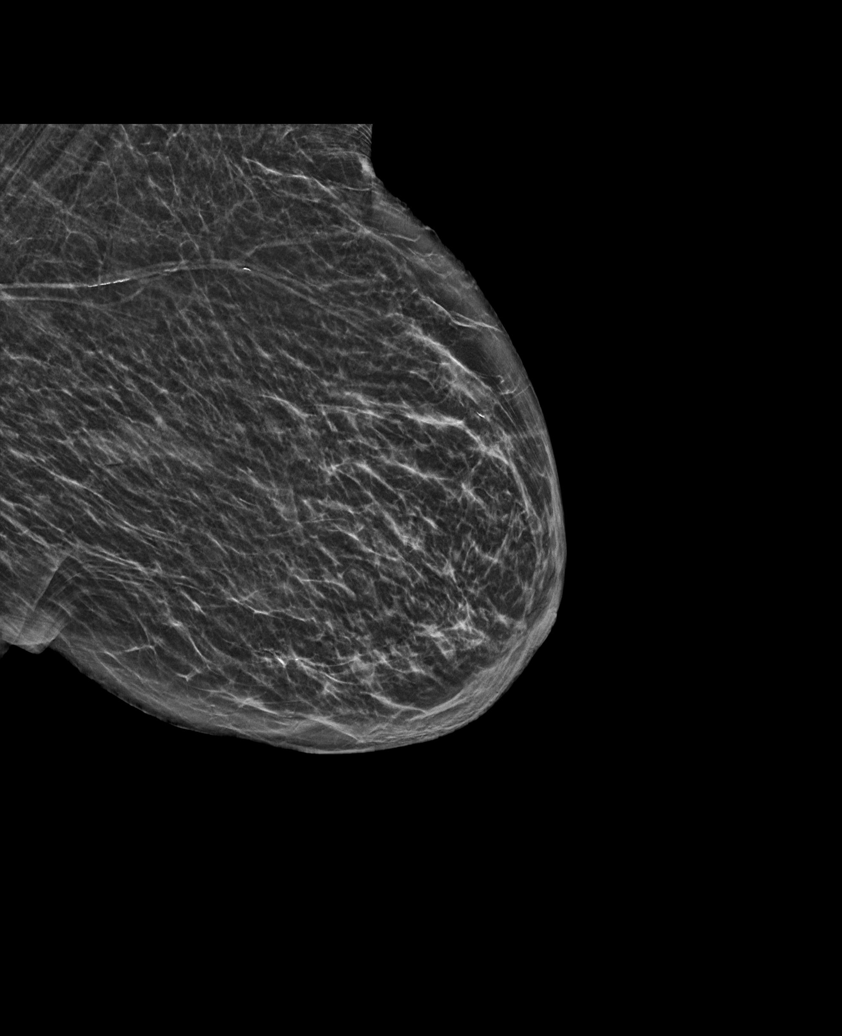

[R CC synth-2D]
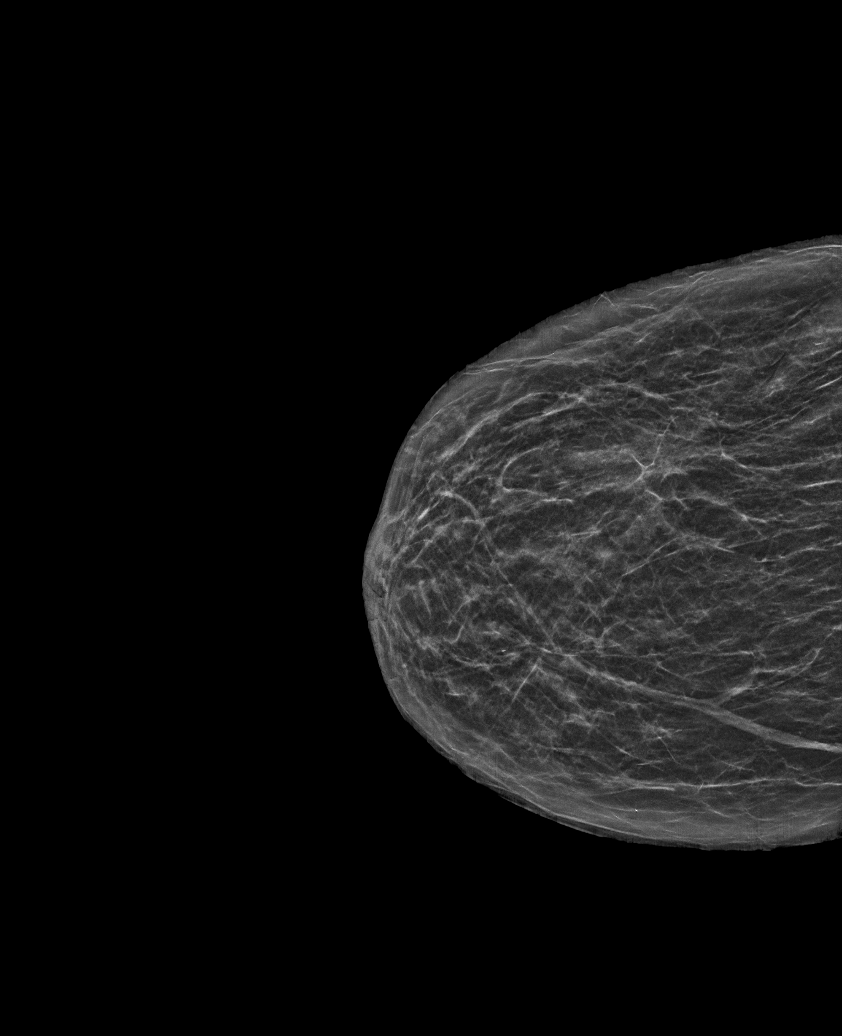

[R MLO synth-2D]
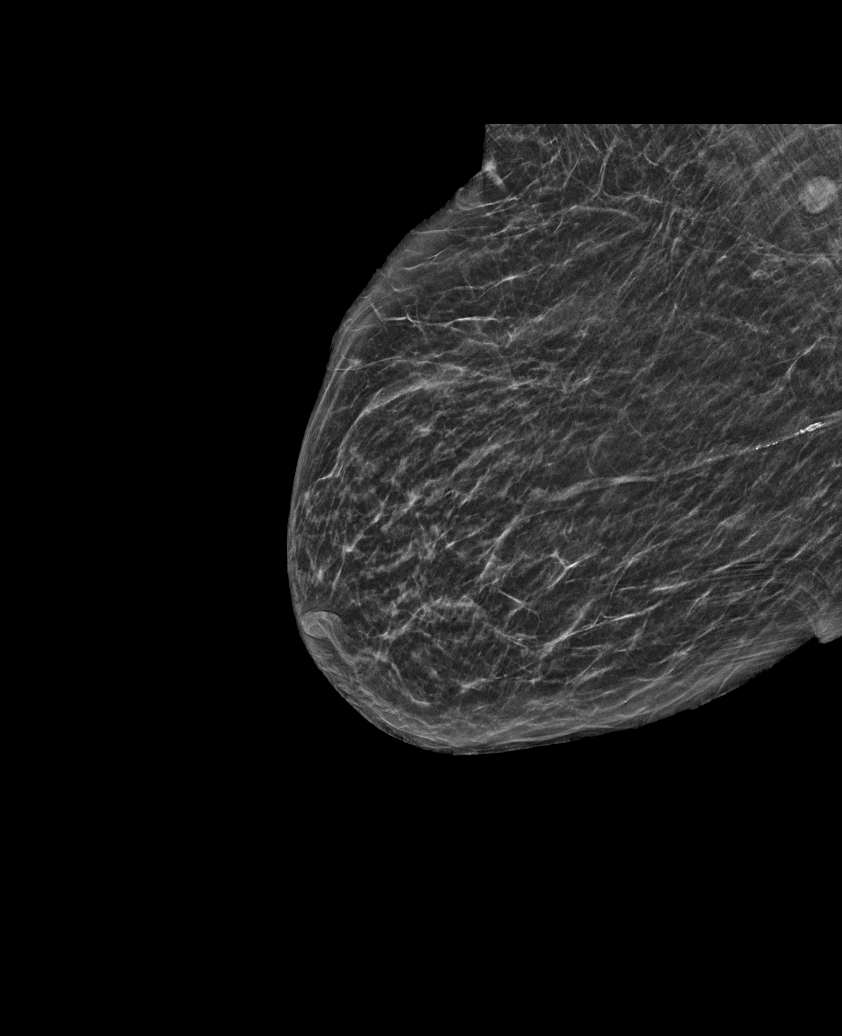

[L CV synth-2D]
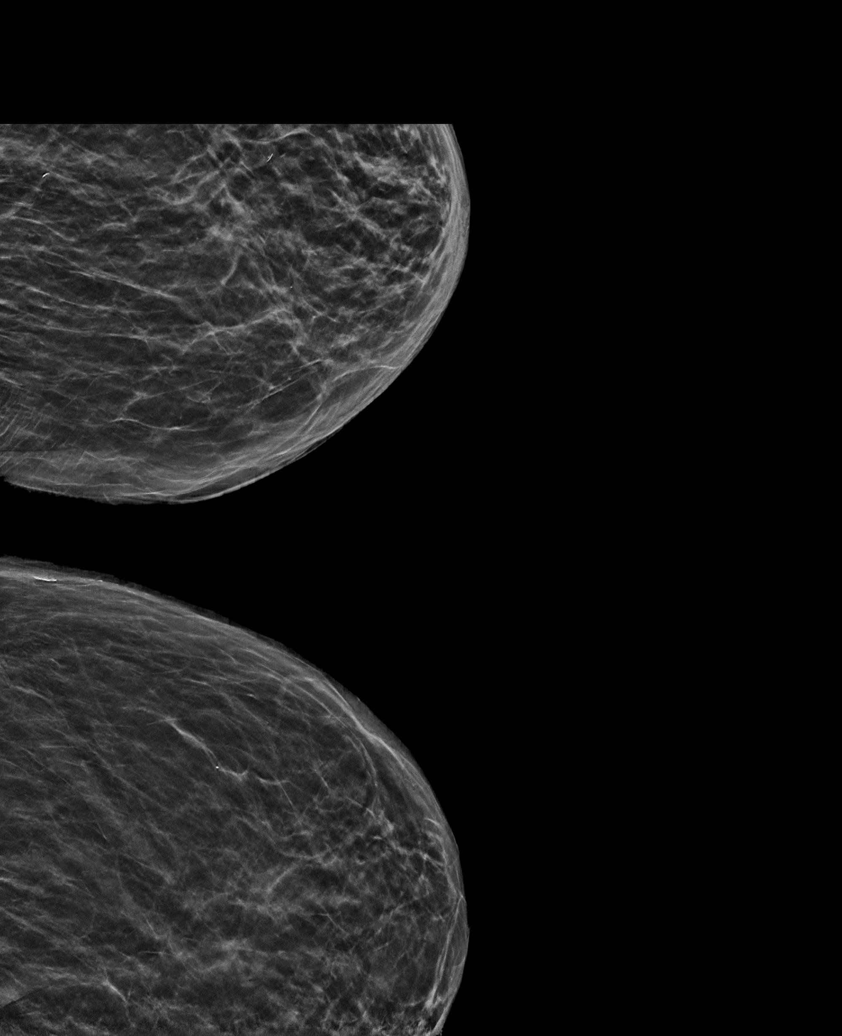

[R CC tomo · tomo slice 23/44.0]
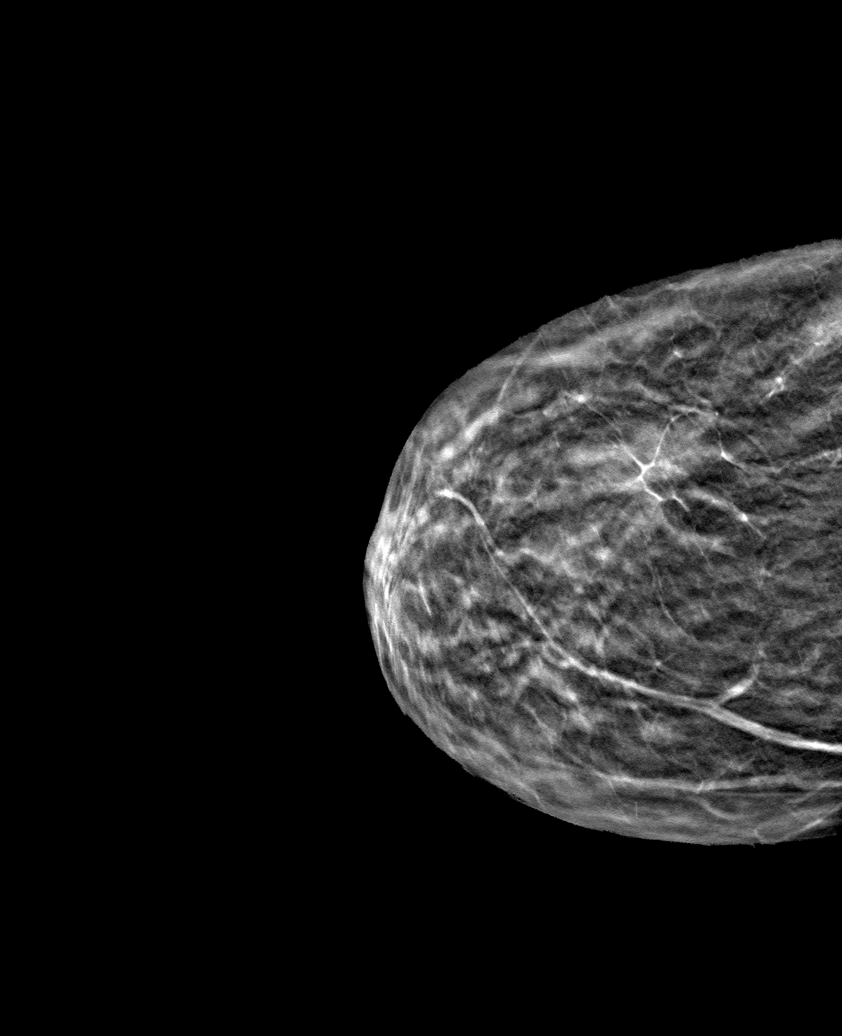

[6 of 30 positions shown; findings below may reference images not displayed]

ACR Breast Density Category b: There are scattered areas of
fibroglandular density.
FINDINGS: There are no findings suspicious for malignancy.
IMPRESSION: No mammographic evidence of malignancy. A result letter of this
screening mammogram will be mailed directly to the patient.

RECOMMENDATION:
Screening mammogram in one year. (Code:51-O-LD2)

BI-RADS CATEGORY  1: Negative.

## 2023-12-09 ENCOUNTER — Other Ambulatory Visit: Payer: Self-pay

## 2023-12-09 ENCOUNTER — Ambulatory Visit (HOSPITAL_COMMUNITY)
Admission: RE | Admit: 2023-12-09 | Discharge: 2023-12-09 | Disposition: A | Discharge status: Home / Self Care | Attending: Vascular Surgery | Admitting: Vascular Surgery

## 2023-12-09 ENCOUNTER — Encounter (HOSPITAL_COMMUNITY): Payer: Self-pay | Admitting: Vascular Surgery

## 2023-12-09 ENCOUNTER — Encounter (HOSPITAL_COMMUNITY)
Admission: RE | Disposition: A | Discharge status: Home / Self Care | Payer: Self-pay | Source: Home / Self Care | Attending: Vascular Surgery

## 2023-12-09 DIAGNOSIS — Z87891 Personal history of nicotine dependence: Secondary | ICD-10-CM | POA: Insufficient documentation

## 2023-12-09 DIAGNOSIS — T82590A Other mechanical complication of surgically created arteriovenous fistula, initial encounter: Secondary | ICD-10-CM

## 2023-12-09 DIAGNOSIS — I120 Hypertensive chronic kidney disease with stage 5 chronic kidney disease or end stage renal disease: Secondary | ICD-10-CM | POA: Diagnosis not present

## 2023-12-09 DIAGNOSIS — N186 End stage renal disease: Secondary | ICD-10-CM

## 2023-12-09 DIAGNOSIS — Z992 Dependence on renal dialysis: Secondary | ICD-10-CM | POA: Diagnosis not present

## 2023-12-09 HISTORY — PX: A/V FISTULAGRAM: CATH118298

## 2023-12-09 SURGERY — A/V FISTULAGRAM
Anesthesia: LOCAL

## 2023-12-09 MED ORDER — IODIXANOL 320 MG/ML IV SOLN
INTRAVENOUS | Status: DC | PRN
Start: 2023-12-09 — End: 2023-12-09
  Administered 2023-12-09: 10 mL

## 2023-12-09 MED ORDER — HEPARIN (PORCINE) IN NACL 1000-0.9 UT/500ML-% IV SOLN
INTRAVENOUS | Status: DC | PRN
  Administered 2023-12-09: 500 mL

## 2023-12-09 MED ORDER — LIDOCAINE HCL (PF) 1 % IJ SOLN
INTRAMUSCULAR | Status: AC
  Filled 2023-12-09: qty 30

## 2023-12-09 MED ORDER — LIDOCAINE HCL (PF) 1 % IJ SOLN
INTRAMUSCULAR | Status: DC | PRN
Start: 2023-12-09 — End: 2023-12-09
  Administered 2023-12-09: 5 mL via INTRADERMAL

## 2023-12-09 SURGICAL SUPPLY — 8 items
CATH ANGIO 5F BER2 65CM (CATHETERS) ×1 IMPLANT
KIT ENCORE 26 ADVANTAGE (KITS) ×1 IMPLANT
KIT MICROPUNCTURE NIT STIFF (SHEATH) ×1 IMPLANT
KIT PV (KITS) ×3 IMPLANT
SHEATH PINNACLE R/O II 7F 4CM (SHEATH) ×1 IMPLANT
SHEATH PROBE COVER 6X72 (BAG) ×1 IMPLANT
TRAY PV CATH (CUSTOM PROCEDURE TRAY) ×3 IMPLANT
TUBING CIL FLEX 10 FLL-RA (TUBING) ×1 IMPLANT

## 2023-12-09 NOTE — H&P (Signed)
 [facility] [place]  ASSESSMENT / PLAN: 83 y.o. female with ESRD. She has an infiltrated fistula. She has enough space to cannulate the fistula and it has a smooth thrill. Plan fistulagram today in cath lab at her center's request.   CHIEF COMPLAINT: Infiltration of fistula  HISTORY OF PRESENT ILLNESS: Linda Rice is a 83 y.o. female referred to the outpatient dialysis access center for evaluation of infiltration of the left arm brachiobasilic AV fistula.  The fistula was infiltrated at dialysis while working with a tech that was not familiar with the patient's fistula.  The patient is usual tech is off medication.  Patient has significant bruising and swelling to the upper arm.  There is a segment of usable fistula in the arm.  Fistulogram was requested to see if a mechanical issue could explain the infiltration.   Past Medical History:  Diagnosis Date   Alopecia    Anxiety    Arthritis    Aspergillosis (HCC)    Asthma    Atopic dermatitis    Bladder incontinence    Chronic kidney disease    Complication of anesthesia, mood lability    1 time -cried x1   Depression, situational    Follicular cystitis    History of bilateral knee replacement    History of blood transfusion    after knee replacement   History of left hip replacement    History of transfusion    Hyperlipidemia    Hypertension    Hypothyroidism    Lactose intolerance    Multiple food allergies    Osteoarthritis    Osteopenia    Pneumonia    PVC (premature ventricular contraction)    Urticaria    Vitamin D  deficiency     Past Surgical History:  Procedure Laterality Date   ADENOIDECTOMY     APPENDECTOMY     AV FISTULA PLACEMENT Left 08/06/2022   Procedure: LEFT Brachial Basilic ARTERIOVENOUS (AV) FISTULA CREATION;  Surgeon: Gretta Lonni PARAS, MD;  Location: [facility] OR;  Service: Vascular;  Laterality: Left;   BASCILIC VEIN TRANSPOSITION Left 10/05/2022   Procedure: LEFT SECOND  STAGE BASILIC VEIN TRANSPOSITION;  Surgeon: Gretta Lonni PARAS, MD;  Location: [facility] OR;  Service: Vascular;  Laterality: Left;   BLADDER REMOVAL     BREAST EXCISIONAL BIOPSY Right 1990   BROW LIFT  2020   CARPAL TUNNEL RELEASE     CHOLECYSTECTOMY     COLONOSCOPY W/ POLYPECTOMY     COLONOSCOPY WITH PROPOFOL  N/A 03/24/2019   Procedure: COLONOSCOPY WITH PROPOFOL ;  Surgeon: Rosalie Kitchens, MD;  Location: [facility] ENDOSCOPY;  Service: Endoscopy;  Laterality: N/A;   EYELID REPAIR W/ SKIN GRAFT     LEFT OOPHORECTOMY     POLYPECTOMY  03/24/2019   Procedure: POLYPECTOMY;  Surgeon: Rosalie Kitchens, MD;  Location: [facility] ENDOSCOPY;  Service: Endoscopy;;   REPLACEMENT TOTAL KNEE BILATERAL     TONSILLECTOMY     TOTAL HIP ARTHROPLASTY Left 09/05/2015   Procedure: LEFT TOTAL HIP ARTHROPLASTY ANTERIOR APPROACH;  Surgeon: Dempsey Moan, MD;  Location: [facility] ORS;  Service: Orthopedics;  Laterality: Left;   TUBAL LIGATION      Family History  Problem Relation Age of Onset   Diabetes Mother    Heart disease Mother    Hyperlipidemia Mother    Hypertension Mother    Stroke Mother    Anxiety disorder Mother    Obesity Mother    Hyperlipidemia Father    Heart disease Father  Allergic rhinitis Father    Hyperlipidemia Sister    Breast cancer Maternal Aunt    Asthma Maternal Grandfather    Breast cancer Cousin     Social History   Socioeconomic History   Marital status: Divorced    Spouse name: Not on file   Number of children: 4   Years of education: Not on file   Highest education level: Not on file  Occupational History   Not on file  Tobacco Use   Smoking status: Former    Current packs/day: 0.00    Average packs/day: 0.5 packs/day for 4.0 years (2.0 ttl pk-yrs)    Types: Cigarettes    Start date: 58    Quit date: 53    Years since quitting: 44.5   Smokeless tobacco: Never   Tobacco comments:    1 pkg a week  Vaping Use   Vaping status: Never Used  Substance and Sexual Activity   Alcohol  use: Not Currently    Alcohol/week: 2.0 standard drinks of alcohol    Types: 1 Glasses of wine, 1 Standard drinks or equivalent per week   Drug use: No   Sexual activity: Not on file  Other Topics Concern   Not on file  Social History Narrative   Not on file   Social Drivers of Health   Financial Resource Strain: Low Risk  (02/06/2021)   [facility] from [facility]   Overall Financial Resource Strain (CARDIA)    Difficulty of Paying Living Expenses: Not very hard  Food Insecurity: Low Risk  (09/11/2023)   Received from [facility]   Hunger Vital Sign    Within the past 12 months, you worried that your food would run out before you got money to buy more: Never true    Within the past 12 months, the food you bought just didn't last and you didn't have money to get more. : Never true  Transportation Needs: No Transportation Needs (09/11/2023)   Received from [facility]    In the past 12 months, has lack of reliable transportation kept you from medical appointments, meetings, work or from getting things needed for daily living? : No  Physical Activity: Not on file  Stress: Not on file  Social Connections: Not on file  Intimate Partner Violence: Not At Risk (10/07/2022)   Humiliation, Afraid, Rape, and Kick questionnaire    Fear of Current or Ex-Partner: No    Emotionally Abused: No    Physically Abused: No    Sexually Abused: No    Allergies  Allergen Reactions   Egg-Derived Products Hives    eczema Poultry Rash and GI symptoms   Erythromycin Other (See Comments)    REACTION: stomach symptoms  and hives   Fish Allergy Other (See Comments) and Swelling    Lip swelling and eczema With fins hives    Gatifloxacin Hives, Nausea Only and Swelling   Other Anaphylaxis, Itching and Swelling    Legumes- all beans and peas and tree nuts cause lip swelling and eczema  Orange Vegetables cause eczema    Peanut-Containing Drug Products Anaphylaxis    And tree nuts     Poultry Meal Diarrhea and Itching   Banana Itching   Keflex [Cephalexin] Rash   Latex Itching    No current facility-administered medications for this encounter.    PHYSICAL EXAM Vitals:   12/09/23 0814 12/09/23 0825  BP: 118/83 120/77  Pulse: 81 82  Resp: 12 12  Temp:  97.6 F (36.4 C)   TempSrc: Oral   SpO2: 91% 99%   Elderly woman in no distress Regular rate and rhythm Unlabored breathing Left arm AV fistula with smooth thrill; significant infiltration above the swing segment of the basilic vein fistula  PERTINENT LABORATORY AND RADIOLOGIC DATA  Most recent CBC    Latest Ref Rng & Units 08/12/2023    1:13 PM 12/25/2022   11:27 AM 11/27/2022   12:08 PM  CBC  WBC 4.0 - 10.5 K/uL 8.0     Hemoglobin 12.0 - 15.0 g/dL 88.0  88.0  89.9   Hematocrit 36.0 - 46.0 % 36.6     Platelets 150 - 400 K/uL 292        Most recent CMP    Latest Ref Rng & Units 08/12/2023    1:13 PM 10/09/2022    3:45 AM 10/08/2022    3:01 AM  CMP  Glucose 70 - 99 mg/dL 91  81  83   BUN 8 - 23 mg/dL 43  45  54   Creatinine 0.44 - 1.00 mg/dL 8.11  7.23  7.15   Sodium 135 - 145 mmol/L 138  138  133   Potassium 3.5 - 5.1 mmol/L 3.9  4.2  4.4   Chloride 98 - 111 mmol/L 105  100  100   CO2 22 - 32 mmol/L 22  27  25    Calcium  8.9 - 10.3 mg/dL 8.7  8.5  8.3   Total Protein 6.5 - 8.1 g/dL  5.6    Total Bilirubin 0.3 - 1.2 mg/dL  0.6    Alkaline Phos 38 - 126 U/L  58    AST 15 - 41 U/L  33    ALT 0 - 44 U/L  12     Hgb A1c MFr Bld (%)  Date Value  04/27/2019 5.6    LDL Chol Calc (NIH)  Date Value Ref Range Status  11/30/2019 88 0 - 99 mg/dL Final     Debby SAILOR. Magda, MD FACS [facility] Phone Number: [phone] 12/09/2023 9:24 AM   Total time spent on preparing this encounter including chart review, data review, collecting history, examining the patient, and coordinating care: 30 minutes  Portions of this report may have been transcribed using  voice recognition software.  Every effort has been made to ensure accuracy; however, inadvertent computerized transcription errors may still be present.

## 2023-12-09 NOTE — Op Note (Signed)
 DATE OF SERVICE: 12/09/2023  PATIENT:  Linda Rice  83 y.o. female  PRE-OPERATIVE DIAGNOSIS: Renal disease, infiltrated fistula  POST-OPERATIVE DIAGNOSIS:  Same  PROCEDURE:   1) ultrasound-guided left arm brachiobasilic AV fistula access (CPT [phone]) 2) left arm fistulogram (CPT [phone]) 3) outpatient level 3 evaluation and management of established patient (CPT [phone])  SURGEON:  Surgeons and Role:    * Magda Debby SAILOR, MD - Primary  ASSISTANT: None  ANESTHESIA:   local  EBL: min  BLOOD ADMINISTERED:none  DRAINS: none   LOCAL MEDICATIONS USED:  LIDOCAINE    SPECIMEN:  none  COUNTS: confirmed correct.  TOURNIQUET:  none  PATIENT DISPOSITION:  PACU - hemodynamically stable.   Delay start of Pharmacological VTE agent (>24hrs) due to surgical blood loss or risk of bleeding: no  INDICATION FOR PROCEDURE: Linda Rice is a 83 y.o. female with end-stage renal disease dialyzing through a left arm right basilic AV fistula.  The fistula was infiltrated at her dialysis center.  The tech that usually accesses her fistula was on vacation.  The dialysis center requested a fistulogram. After careful discussion of risks, benefits, and alternatives the patient was offered fistulogram. The patient understood and wished to proceed.  OPERATIVE FINDINGS:  Initial fistulogram suggested left innominate vein stenosis.  This was not demonstrated after crossing the vein with a wire.  No left subclavian or axillary vein stenosis.  The fistula is normal throughout its course.  No extravasation or pseudoaneurysm.  DESCRIPTION OF PROCEDURE: After identification of the patient in the pre-operative holding area, the patient was transferred to the operating room. The patient was positioned supine on the operating room table. Anesthesia was induced. The left arm was prepped and draped in standard fashion. A surgical pause was performed confirming correct patient, procedure, and operative  location.  1% lidocaine  was infiltrated around the area of planned access.  Using ultrasound guidance, the left arm AV fistula was accessed with micropuncture technique.  Using Seldinger technique, micro sheath was introduced into the fistula.  Fistulogram was performed in stations.  See above for details.  A zip wire was used to cross what I thought was innominate vein stenosis.  This resolved on follow-up fistulogram with a wire across the vein.  I ended the case here.  All endovascular clot was removed.  A pursestring stitch was applied around the access site with good hemostasis.  A bandage was applied  Upon completion of the case instrument and sharps counts were confirmed correct. The patient was transferred to the PACU in good condition. I was present for all portions of the procedure.  FOLLOW UP PLAN: Patient can continue using the fistula.  Patient should use experienced techs for access of her fistula given her atrophic skin and mobile fistula.  Follow-up with us  as needed.  Debby SAILOR. Magda, MD [facility] Phone Number: [phone] 12/09/2023 9:57 AM

## 2023-12-15 ENCOUNTER — Ambulatory Visit
Admission: EM | Admit: 2023-12-15 | Discharge: 2023-12-15 | Disposition: A | Discharge status: Home / Self Care | Attending: Family Medicine | Admitting: Family Medicine

## 2023-12-15 DIAGNOSIS — S51812A Laceration without foreign body of left forearm, initial encounter: Secondary | ICD-10-CM | POA: Diagnosis not present

## 2023-12-15 MED ORDER — BACITRACIN ZINC 500 UNIT/GM EX OINT: 1.0000 | TOPICAL_OINTMENT | Freq: Two times a day (BID) | CUTANEOUS | 0 refills | Status: AC

## 2023-12-15 NOTE — ED Provider Notes (Signed)
 [facility]  Note:  This document was prepared using Conservation officer, historic buildings and may include unintentional dictation errors.  MRN: [id number] DOB: 02/26/1941  Subjective:   Linda Rice is a 83 y.o. female presenting for 1 week history of persistent left forearm skin tear.  Patient has had minimal tenderness.  Has noted some bruising.  Has had recurrent intermittent bleeding.  Was advised by her dialysis nurse to come in for an evaluation.   No current facility-administered medications for this encounter.  Current Outpatient Medications:    multivitamin (RENA-VIT) TABS tablet, Take 1 tablet by mouth daily., Disp: , Rfl:    albuterol  (VENTOLIN  HFA) 108 (90 Base) MCG/ACT inhaler, Inhale 1-2 puffs into the lungs every 6 (six) hours as needed for wheezing or shortness of breath., Disp: 18 g, Rfl: 1   apixaban  (ELIQUIS ) 2.5 MG TABS tablet, Take 1 tablet (2.5 mg total) by mouth 2 (two) times daily for 7 days., Disp: 14 tablet, Rfl: 0   apixaban  (ELIQUIS ) 2.5 MG TABS tablet, Take 1 tablet (2.5 mg total) by mouth 2 (two) times daily., Disp: 180 tablet, Rfl: 3   atorvastatin  (LIPITOR) 10 MG tablet, Take 10 mg by mouth in the morning., Disp: , Rfl:    Calcium  Carb-Cholecalciferol  (CALCIUM  500 + D PO), Take 1 tablet by mouth daily., Disp: , Rfl:    calcium  citrate (CALCITRATE - DOSED IN MG ELEMENTAL CALCIUM ) 950 (200 Ca) MG tablet, 1 tablet Orally Once a day; Duration: 30 day(s), Disp: , Rfl:    cetirizine (ZYRTEC) 10 MG tablet, Take 10 mg by mouth at bedtime., Disp: , Rfl:    doxycycline  (VIBRAMYCIN ) 100 MG capsule, Take 100 mg by mouth 2 (two) times daily., Disp: , Rfl: 4   EPINEPHrine  0.3 mg/0.3 mL IJ SOAJ injection, Inject 0.3 mLs (0.3 mg total) into the muscle as needed for anaphylaxis., Disp: 2 each, Rfl: 1   furosemide  (LASIX ) 40 MG tablet, Take 40 mg by mouth daily. Take as needed for swelling., Disp: , Rfl:    Levothyroxine  Sodium 88 MCG CAPS, Take 88  mcg by mouth daily before breakfast., Disp: , Rfl:    Magnesium  500 MG TABS, Take 500 mg by mouth daily., Disp: , Rfl:    metoprolol  tartrate (LOPRESSOR ) 25 MG tablet, Take 1 tablet (25 mg total) by mouth 2 (two) times daily., Disp: 180 tablet, Rfl: 3   mirtazapine  (REMERON ) 15 MG tablet, Take 1 tablet by mouth at bedtime., Disp: , Rfl:    Multiple Vitamins-Minerals (MULTIVITAMIN WITH MINERALS) tablet, Take 1 tablet by mouth daily. Woman, Disp: , Rfl:    sodium bicarbonate  650 MG tablet, Take 1,300 mg by mouth 2 (two) times daily., Disp: , Rfl:    Allergies  Allergen Reactions   Egg-Derived Products Hives    eczema Poultry Rash and GI symptoms   Erythromycin Other (See Comments)    REACTION: stomach symptoms  and hives   Fish Allergy Other (See Comments) and Swelling    Lip swelling and eczema With fins hives    Gatifloxacin Hives, Nausea Only and Swelling   Other Anaphylaxis, Itching and Swelling    Legumes- all beans and peas and tree nuts cause lip swelling and eczema  Orange Vegetables cause eczema    Peanut-Containing Drug Products Anaphylaxis    And tree nuts    Banana Extract Allergy Skin Test Itching   Horse-Derived Products     Other Reaction(s): as child   [employer]  Diarrhea and Itching   Banana Itching   Chicken Meat (Diagnostic) Rash   Keflex [Cephalexin] Rash   Latex Itching    Past Medical History:  Diagnosis Date   Alopecia    Anxiety    Arthritis    Aspergillosis (HCC)    Asthma    Atopic dermatitis    Bladder incontinence    Chronic kidney disease    Complication of anesthesia, mood lability    1 time -cried x1   Depression, situational    Follicular cystitis    History of bilateral knee replacement    History of blood transfusion    after knee replacement   History of left hip replacement    History of transfusion    Hyperlipidemia    Hypertension    Hypothyroidism    Lactose intolerance    Multiple food allergies    Osteoarthritis     Osteopenia    Pneumonia    PVC (premature ventricular contraction)    Urticaria    Vitamin D  deficiency      Past Surgical History:  Procedure Laterality Date   A/V FISTULAGRAM N/A 12/09/2023   Procedure: A/V Fistulagram;  Surgeon: Magda Debby SAILOR, MD;  Location: [facility];  Service: Cardiovascular;  Laterality: N/A;   ADENOIDECTOMY     APPENDECTOMY     AV FISTULA PLACEMENT Left 08/06/2022   Procedure: LEFT Brachial Basilic ARTERIOVENOUS (AV) FISTULA CREATION;  Surgeon: Gretta Lonni PARAS, MD;  Location: [facility];  Service: Vascular;  Laterality: Left;   BASCILIC VEIN TRANSPOSITION Left 10/05/2022   Procedure: LEFT SECOND STAGE BASILIC VEIN TRANSPOSITION;  Surgeon: Gretta Lonni PARAS, MD;  Location: [facility];  Service: Vascular;  Laterality: Left;   BLADDER REMOVAL     BREAST EXCISIONAL BIOPSY Right 1990   BROW LIFT  2020   CARPAL TUNNEL RELEASE     CHOLECYSTECTOMY     COLONOSCOPY W/ POLYPECTOMY     COLONOSCOPY WITH PROPOFOL  N/A 03/24/2019   Procedure: COLONOSCOPY WITH PROPOFOL ;  Surgeon: Rosalie Kitchens, MD;  Location: [facility]COPY;  Service: Endoscopy;  Laterality: N/A;   EYELID REPAIR W/ SKIN GRAFT     LEFT OOPHORECTOMY     POLYPECTOMY  03/24/2019   Procedure: POLYPECTOMY;  Surgeon: Rosalie Kitchens, MD;  Location: [facility] ENDOSCOPY;  Service: Endoscopy;;   REPLACEMENT TOTAL KNEE BILATERAL     TONSILLECTOMY     TOTAL HIP ARTHROPLASTY Left 09/05/2015   Procedure: LEFT TOTAL HIP ARTHROPLASTY ANTERIOR APPROACH;  Surgeon: Dempsey Moan, MD;  Location: [facility];  Service: Orthopedics;  Laterality: Left;   TUBAL LIGATION      Family History  Problem Relation Age of Onset   Diabetes Mother    Heart disease Mother    Hyperlipidemia Mother    Hypertension Mother    Stroke Mother    Anxiety disorder Mother    Obesity Mother    Hyperlipidemia Father    Heart disease Father    Allergic rhinitis Father    Hyperlipidemia Sister    Breast cancer Maternal Aunt    Asthma Maternal Grandfather     Breast cancer Cousin     Social History   Tobacco Use   Smoking status: Former    Current packs/day: 0.00    Average packs/day: 0.5 packs/day for 4.0 years (2.0 ttl pk-yrs)    Types: Cigarettes    Start date: 53    Quit date: 59    Years since quitting: 44.5   Smokeless tobacco: Never   Tobacco  comments:    1 pkg a week  Vaping Use   Vaping status: Never Used  Substance Use Topics   Alcohol use: Not Currently    Alcohol/week: 2.0 standard drinks of alcohol    Types: 1 Glasses of wine, 1 Standard drinks or equivalent per week   Drug use: No    ROS   Objective:   Vitals: BP 121/68 (BP Location: Right Arm)   Pulse 92   Temp 98 F (36.7 C) (Oral)   Resp 18   SpO2 96%   Physical Exam Constitutional:      General: She is not in acute distress.    Appearance: Normal appearance. She is well-developed. She is not ill-appearing, toxic-appearing or diaphoretic.  HENT:     Head: Normocephalic and atraumatic.     Nose: Nose normal.     Mouth/Throat:     Mouth: Mucous membranes are moist.  Eyes:     General: No scleral icterus.       Right eye: No discharge.        Left eye: No discharge.     Extraocular Movements: Extraocular movements intact.  Cardiovascular:     Rate and Rhythm: Normal rate.  Pulmonary:     Effort: Pulmonary effort is normal.  Skin:    General: Skin is warm and dry.     Comments: Resolving skin tear of the left dorsal aspect of the forearm with associated ecchymosis.  Extends ~2cm.   Neurological:     General: No focal deficit present.     Mental Status: She is alert and oriented to person, place, and time.  Psychiatric:        Mood and Affect: Mood normal.        Behavior: Behavior normal.      Wound cleansed thoroughly.  Bacitracin  applied to the wound, covered with nonadherent dressing pad.  Secured with Coban.  Assessment and Plan :   PDMP not reviewed this encounter.  1. Skin tear of forearm without complication, left, initial  encounter    Wound is well-appearing.  No sign of infection.  Patient takes doxycycline  chronically.  I will defer further antibiotic use.  Recommended general wound care. Counseled patient on potential for adverse effects with medications prescribed/recommended today, ER and return-to-clinic precautions discussed, patient verbalized understanding.    Christopher Savannah, [place] 12/15/23 1421

## 2023-12-15 NOTE — ED Triage Notes (Signed)
 Pt reports skin tear in the left arm, happened with a metal storm door on 12/09/23. Pt reports a nurse at the dialysis clinic yesterday told her she needs stitches or glue. Denies pain.

## 2023-12-15 NOTE — Discharge Instructions (Signed)
 Change your dressing 2-3 times daily. Every time you change your dressing, clean the wound gently with warm water and Dial antibacterial soap. Pat the wound dry, let it breathe for roughly an hour before covering it back up. When you reapply a dressing, apply Bacitracin ointment to the wound, then cover with non-stick/non-adherent gauze.

## 2024-03-09 ENCOUNTER — Encounter: Payer: Self-pay | Admitting: Cardiology

## 2024-03-10 NOTE — Telephone Encounter (Signed)
 You are on very low dose. I think you will be okay completely stopping it.  Regards, Dr. El[place]

## 2024-07-08 ENCOUNTER — Encounter: Payer: Self-pay | Admitting: Cardiology

## 2024-07-08 ENCOUNTER — Telehealth: Payer: Self-pay

## 2024-07-08 MED ORDER — APIXABAN 2.5 MG PO TABS: 2.5000 mg | ORAL_TABLET | Freq: Two times a day (BID) | ORAL | Status: AC

## 2024-07-08 NOTE — Telephone Encounter (Signed)
 [facility] to patient to advise that samples of 2.5 mg eliquis  available for her at our office.
